# Patient Record
Sex: Female | Born: 1975 | Race: White | Hispanic: No | Smoking: Current some day smoker
Health system: Southern US, Community
[De-identification: ages and names within clinical notes are randomized; demographics above are authoritative.]

## PROBLEM LIST (undated history)

## (undated) DIAGNOSIS — K922 Gastrointestinal hemorrhage, unspecified: Secondary | ICD-10-CM

## (undated) DIAGNOSIS — F319 Bipolar disorder, unspecified: Secondary | ICD-10-CM

## (undated) DIAGNOSIS — Z972 Presence of dental prosthetic device (complete) (partial): Secondary | ICD-10-CM

## (undated) DIAGNOSIS — Z765 Malingerer [conscious simulation]: Secondary | ICD-10-CM

## (undated) DIAGNOSIS — E669 Obesity, unspecified: Secondary | ICD-10-CM

## (undated) DIAGNOSIS — E7800 Pure hypercholesterolemia, unspecified: Secondary | ICD-10-CM

## (undated) DIAGNOSIS — D649 Anemia, unspecified: Secondary | ICD-10-CM

## (undated) DIAGNOSIS — J449 Chronic obstructive pulmonary disease, unspecified: Secondary | ICD-10-CM

## (undated) DIAGNOSIS — I509 Heart failure, unspecified: Secondary | ICD-10-CM

## (undated) DIAGNOSIS — F32A Depression, unspecified: Secondary | ICD-10-CM

## (undated) DIAGNOSIS — J189 Pneumonia, unspecified organism: Secondary | ICD-10-CM

## (undated) DIAGNOSIS — G43909 Migraine, unspecified, not intractable, without status migrainosus: Secondary | ICD-10-CM

## (undated) DIAGNOSIS — F419 Anxiety disorder, unspecified: Secondary | ICD-10-CM

## (undated) DIAGNOSIS — K219 Gastro-esophageal reflux disease without esophagitis: Secondary | ICD-10-CM

## (undated) DIAGNOSIS — J353 Hypertrophy of tonsils with hypertrophy of adenoids: Secondary | ICD-10-CM

## (undated) DIAGNOSIS — F329 Major depressive disorder, single episode, unspecified: Secondary | ICD-10-CM

## (undated) DIAGNOSIS — E119 Type 2 diabetes mellitus without complications: Secondary | ICD-10-CM

## (undated) HISTORY — PX: TONSILLECTOMY: SUR1361

## (undated) HISTORY — DX: Type 2 diabetes mellitus without complications: E11.9

## (undated) HISTORY — PX: CHOLECYSTECTOMY: SHX55

## (undated) HISTORY — PX: OTHER SURGICAL HISTORY: SHX169

## (undated) HISTORY — PX: APPENDECTOMY: SHX54

---

## 1997-11-14 ENCOUNTER — Observation Stay (HOSPITAL_COMMUNITY): Admission: AD | Admit: 1997-11-14 | Discharge: 1997-11-15 | Payer: Self-pay | Admitting: Obstetrics

## 1998-01-26 ENCOUNTER — Emergency Department (HOSPITAL_COMMUNITY): Admission: EM | Admit: 1998-01-26 | Discharge: 1998-01-26 | Payer: Self-pay | Admitting: Emergency Medicine

## 1998-02-05 ENCOUNTER — Emergency Department (HOSPITAL_COMMUNITY): Admission: EM | Admit: 1998-02-05 | Discharge: 1998-02-05 | Payer: Self-pay | Admitting: Emergency Medicine

## 1998-03-25 ENCOUNTER — Emergency Department (HOSPITAL_COMMUNITY): Admission: EM | Admit: 1998-03-25 | Discharge: 1998-03-25 | Payer: Self-pay | Admitting: Emergency Medicine

## 1998-04-10 ENCOUNTER — Emergency Department (HOSPITAL_COMMUNITY): Admission: EM | Admit: 1998-04-10 | Discharge: 1998-04-10 | Payer: Self-pay | Admitting: Family Medicine

## 1998-04-12 ENCOUNTER — Emergency Department (HOSPITAL_COMMUNITY): Admission: EM | Admit: 1998-04-12 | Discharge: 1998-04-13 | Payer: Self-pay | Admitting: Emergency Medicine

## 1998-04-13 ENCOUNTER — Inpatient Hospital Stay (HOSPITAL_COMMUNITY): Admission: AD | Admit: 1998-04-13 | Discharge: 1998-04-13 | Payer: Self-pay | Admitting: *Deleted

## 1998-04-15 ENCOUNTER — Ambulatory Visit (HOSPITAL_COMMUNITY): Admission: RE | Admit: 1998-04-15 | Discharge: 1998-04-15 | Payer: Self-pay | Admitting: Obstetrics and Gynecology

## 1998-04-17 ENCOUNTER — Ambulatory Visit (HOSPITAL_COMMUNITY): Admission: RE | Admit: 1998-04-17 | Discharge: 1998-04-17 | Payer: Self-pay | Admitting: Obstetrics and Gynecology

## 1998-04-22 ENCOUNTER — Emergency Department (HOSPITAL_COMMUNITY): Admission: EM | Admit: 1998-04-22 | Discharge: 1998-04-22 | Payer: Self-pay | Admitting: Emergency Medicine

## 1998-04-25 ENCOUNTER — Ambulatory Visit (HOSPITAL_COMMUNITY): Admission: RE | Admit: 1998-04-25 | Discharge: 1998-04-25 | Payer: Self-pay | Admitting: Obstetrics and Gynecology

## 1998-05-09 ENCOUNTER — Inpatient Hospital Stay (HOSPITAL_COMMUNITY): Admission: AD | Admit: 1998-05-09 | Discharge: 1998-05-09 | Payer: Self-pay | Admitting: Obstetrics & Gynecology

## 1998-05-10 ENCOUNTER — Ambulatory Visit (HOSPITAL_COMMUNITY): Admission: RE | Admit: 1998-05-10 | Discharge: 1998-05-10 | Payer: Self-pay | Admitting: Obstetrics & Gynecology

## 1998-09-22 ENCOUNTER — Encounter: Payer: Self-pay | Admitting: Emergency Medicine

## 1998-09-22 ENCOUNTER — Emergency Department (HOSPITAL_COMMUNITY): Admission: EM | Admit: 1998-09-22 | Discharge: 1998-09-22 | Payer: Self-pay | Admitting: Emergency Medicine

## 1998-09-24 ENCOUNTER — Emergency Department (HOSPITAL_COMMUNITY): Admission: EM | Admit: 1998-09-24 | Discharge: 1998-09-24 | Payer: Self-pay | Admitting: Internal Medicine

## 1998-10-05 ENCOUNTER — Inpatient Hospital Stay (HOSPITAL_COMMUNITY): Admission: AD | Admit: 1998-10-05 | Discharge: 1998-10-05 | Payer: Self-pay | Admitting: *Deleted

## 1998-10-10 ENCOUNTER — Emergency Department (HOSPITAL_COMMUNITY): Admission: EM | Admit: 1998-10-10 | Discharge: 1998-10-10 | Payer: Self-pay | Admitting: Emergency Medicine

## 1998-11-09 ENCOUNTER — Emergency Department (HOSPITAL_COMMUNITY): Admission: EM | Admit: 1998-11-09 | Discharge: 1998-11-09 | Payer: Self-pay | Admitting: Emergency Medicine

## 1998-12-04 ENCOUNTER — Inpatient Hospital Stay (HOSPITAL_COMMUNITY): Admission: AD | Admit: 1998-12-04 | Discharge: 1998-12-04 | Payer: Self-pay | Admitting: Obstetrics

## 1998-12-04 ENCOUNTER — Encounter: Payer: Self-pay | Admitting: Obstetrics

## 1998-12-07 ENCOUNTER — Emergency Department (HOSPITAL_COMMUNITY): Admission: EM | Admit: 1998-12-07 | Discharge: 1998-12-07 | Payer: Self-pay | Admitting: Internal Medicine

## 1998-12-07 ENCOUNTER — Emergency Department (HOSPITAL_COMMUNITY): Admission: EM | Admit: 1998-12-07 | Discharge: 1998-12-07 | Payer: Self-pay | Admitting: Emergency Medicine

## 1999-01-09 ENCOUNTER — Inpatient Hospital Stay (HOSPITAL_COMMUNITY): Admission: EM | Admit: 1999-01-09 | Discharge: 1999-01-10 | Payer: Self-pay | Admitting: Emergency Medicine

## 1999-01-10 ENCOUNTER — Inpatient Hospital Stay (HOSPITAL_COMMUNITY): Admission: RE | Admit: 1999-01-10 | Discharge: 1999-01-12 | Payer: Self-pay | Admitting: Specialist

## 1999-06-12 ENCOUNTER — Emergency Department (HOSPITAL_COMMUNITY): Admission: EM | Admit: 1999-06-12 | Discharge: 1999-06-13 | Payer: Self-pay | Admitting: Emergency Medicine

## 1999-07-11 ENCOUNTER — Emergency Department (HOSPITAL_COMMUNITY): Admission: EM | Admit: 1999-07-11 | Discharge: 1999-07-11 | Payer: Self-pay | Admitting: Emergency Medicine

## 2000-02-02 ENCOUNTER — Inpatient Hospital Stay (HOSPITAL_COMMUNITY): Admission: EM | Admit: 2000-02-02 | Discharge: 2000-02-02 | Payer: Self-pay | Admitting: Obstetrics & Gynecology

## 2000-04-07 ENCOUNTER — Inpatient Hospital Stay (HOSPITAL_COMMUNITY): Admission: RE | Admit: 2000-04-07 | Discharge: 2000-04-07 | Payer: Self-pay | Admitting: *Deleted

## 2000-04-09 ENCOUNTER — Ambulatory Visit (HOSPITAL_COMMUNITY): Admission: AD | Admit: 2000-04-09 | Discharge: 2000-04-09 | Payer: Self-pay | Admitting: *Deleted

## 2000-04-09 ENCOUNTER — Encounter (INDEPENDENT_AMBULATORY_CARE_PROVIDER_SITE_OTHER): Payer: Self-pay

## 2000-04-09 HISTORY — PX: DILATION AND EVACUATION: SHX1459

## 2001-02-03 ENCOUNTER — Inpatient Hospital Stay (HOSPITAL_COMMUNITY): Admission: AD | Admit: 2001-02-03 | Discharge: 2001-02-03 | Payer: Self-pay | Admitting: *Deleted

## 2001-02-08 ENCOUNTER — Inpatient Hospital Stay (HOSPITAL_COMMUNITY): Admission: AD | Admit: 2001-02-08 | Discharge: 2001-02-08 | Payer: Self-pay | Admitting: *Deleted

## 2001-02-08 ENCOUNTER — Encounter: Payer: Self-pay | Admitting: *Deleted

## 2001-02-23 ENCOUNTER — Other Ambulatory Visit: Admission: RE | Admit: 2001-02-23 | Discharge: 2001-02-23 | Payer: Self-pay | Admitting: Obstetrics

## 2001-05-30 ENCOUNTER — Inpatient Hospital Stay (HOSPITAL_COMMUNITY): Admission: AD | Admit: 2001-05-30 | Discharge: 2001-05-30 | Payer: Self-pay | Admitting: Obstetrics

## 2001-06-19 ENCOUNTER — Inpatient Hospital Stay (HOSPITAL_COMMUNITY): Admission: AD | Admit: 2001-06-19 | Discharge: 2001-06-19 | Payer: Self-pay | Admitting: Obstetrics

## 2001-07-13 ENCOUNTER — Inpatient Hospital Stay (HOSPITAL_COMMUNITY): Admission: AD | Admit: 2001-07-13 | Discharge: 2001-07-13 | Payer: Self-pay | Admitting: Obstetrics

## 2001-07-20 ENCOUNTER — Inpatient Hospital Stay (HOSPITAL_COMMUNITY): Admission: AD | Admit: 2001-07-20 | Discharge: 2001-07-20 | Payer: Self-pay | Admitting: Obstetrics

## 2001-07-20 ENCOUNTER — Encounter: Payer: Self-pay | Admitting: Obstetrics

## 2001-07-21 ENCOUNTER — Inpatient Hospital Stay (HOSPITAL_COMMUNITY): Admission: AD | Admit: 2001-07-21 | Discharge: 2001-07-21 | Payer: Self-pay | Admitting: Obstetrics

## 2001-07-24 ENCOUNTER — Observation Stay (HOSPITAL_COMMUNITY): Admission: AD | Admit: 2001-07-24 | Discharge: 2001-07-25 | Payer: Self-pay | Admitting: Obstetrics

## 2001-08-30 ENCOUNTER — Inpatient Hospital Stay (HOSPITAL_COMMUNITY): Admission: AD | Admit: 2001-08-30 | Discharge: 2001-08-30 | Payer: Self-pay | Admitting: Obstetrics

## 2001-08-31 ENCOUNTER — Encounter (INDEPENDENT_AMBULATORY_CARE_PROVIDER_SITE_OTHER): Payer: Self-pay

## 2001-08-31 ENCOUNTER — Inpatient Hospital Stay (HOSPITAL_COMMUNITY): Admission: AD | Admit: 2001-08-31 | Discharge: 2001-09-03 | Payer: Self-pay | Admitting: Obstetrics

## 2001-08-31 HISTORY — PX: TUBAL LIGATION: SHX77

## 2002-01-25 ENCOUNTER — Emergency Department (HOSPITAL_COMMUNITY): Admission: EM | Admit: 2002-01-25 | Discharge: 2002-01-25 | Payer: Self-pay | Admitting: Emergency Medicine

## 2002-02-09 ENCOUNTER — Emergency Department (HOSPITAL_COMMUNITY): Admission: EM | Admit: 2002-02-09 | Discharge: 2002-02-09 | Payer: Self-pay

## 2002-08-22 ENCOUNTER — Emergency Department (HOSPITAL_COMMUNITY): Admission: EM | Admit: 2002-08-22 | Discharge: 2002-08-22 | Payer: Self-pay | Admitting: Emergency Medicine

## 2002-12-11 ENCOUNTER — Inpatient Hospital Stay (HOSPITAL_COMMUNITY): Admission: AD | Admit: 2002-12-11 | Discharge: 2002-12-11 | Payer: Self-pay | Admitting: Family Medicine

## 2002-12-11 ENCOUNTER — Encounter: Payer: Self-pay | Admitting: Obstetrics and Gynecology

## 2002-12-20 ENCOUNTER — Emergency Department (HOSPITAL_COMMUNITY): Admission: EM | Admit: 2002-12-20 | Discharge: 2002-12-20 | Payer: Self-pay

## 2003-07-22 ENCOUNTER — Emergency Department (HOSPITAL_COMMUNITY): Admission: AD | Admit: 2003-07-22 | Discharge: 2003-07-22 | Payer: Self-pay | Admitting: Internal Medicine

## 2004-10-12 ENCOUNTER — Emergency Department: Payer: Self-pay | Admitting: Internal Medicine

## 2004-10-12 ENCOUNTER — Emergency Department: Payer: Self-pay | Admitting: General Practice

## 2004-11-19 ENCOUNTER — Emergency Department (HOSPITAL_COMMUNITY): Admission: EM | Admit: 2004-11-19 | Discharge: 2004-11-19 | Payer: Self-pay | Admitting: Family Medicine

## 2005-02-09 ENCOUNTER — Emergency Department: Payer: Self-pay | Admitting: Internal Medicine

## 2008-04-03 ENCOUNTER — Emergency Department (HOSPITAL_COMMUNITY): Admission: EM | Admit: 2008-04-03 | Discharge: 2008-04-03 | Payer: Self-pay | Admitting: Emergency Medicine

## 2008-06-24 ENCOUNTER — Emergency Department: Payer: Self-pay | Admitting: Emergency Medicine

## 2008-07-01 ENCOUNTER — Emergency Department (HOSPITAL_COMMUNITY): Admission: EM | Admit: 2008-07-01 | Discharge: 2008-07-01 | Payer: Self-pay | Admitting: Emergency Medicine

## 2008-09-09 ENCOUNTER — Emergency Department: Payer: Self-pay | Admitting: Emergency Medicine

## 2008-11-08 ENCOUNTER — Emergency Department (HOSPITAL_COMMUNITY): Admission: EM | Admit: 2008-11-08 | Discharge: 2008-11-08 | Payer: Self-pay | Admitting: Family Medicine

## 2010-03-09 ENCOUNTER — Emergency Department (HOSPITAL_COMMUNITY): Admission: EM | Admit: 2010-03-09 | Discharge: 2010-03-09 | Payer: Self-pay | Admitting: Emergency Medicine

## 2010-03-11 ENCOUNTER — Inpatient Hospital Stay (HOSPITAL_COMMUNITY): Admission: AD | Admit: 2010-03-11 | Discharge: 2010-03-11 | Payer: Self-pay | Admitting: Family Medicine

## 2010-03-11 ENCOUNTER — Ambulatory Visit: Payer: Self-pay | Admitting: Advanced Practice Midwife

## 2010-05-04 ENCOUNTER — Emergency Department (HOSPITAL_COMMUNITY): Admission: EM | Admit: 2010-05-04 | Discharge: 2010-05-04 | Payer: Self-pay | Admitting: Emergency Medicine

## 2010-06-18 ENCOUNTER — Emergency Department: Payer: Self-pay | Admitting: Unknown Physician Specialty

## 2010-09-02 ENCOUNTER — Ambulatory Visit: Payer: Self-pay | Admitting: Surgery

## 2010-09-09 ENCOUNTER — Ambulatory Visit: Payer: Self-pay | Admitting: Surgery

## 2010-09-10 LAB — PATHOLOGY REPORT

## 2010-09-11 ENCOUNTER — Ambulatory Visit: Payer: Self-pay | Admitting: Surgery

## 2010-12-15 LAB — URINALYSIS, ROUTINE W REFLEX MICROSCOPIC
Ketones, ur: NEGATIVE mg/dL
Leukocytes, UA: NEGATIVE
Nitrite: POSITIVE — AB
Urobilinogen, UA: 0.2 mg/dL (ref 0.0–1.0)
pH: 6 (ref 5.0–8.0)

## 2010-12-15 LAB — DIFFERENTIAL
Basophils Absolute: 0 10*3/uL (ref 0.0–0.1)
Basophils Relative: 0 % (ref 0–1)
Eosinophils Relative: 0 % (ref 0–5)
Monocytes Absolute: 0.6 10*3/uL (ref 0.1–1.0)

## 2010-12-15 LAB — PREGNANCY, URINE: Preg Test, Ur: NEGATIVE

## 2010-12-15 LAB — LIPASE, BLOOD: Lipase: 23 U/L (ref 11–59)

## 2010-12-15 LAB — COMPREHENSIVE METABOLIC PANEL
AST: 18 U/L (ref 0–37)
Alkaline Phosphatase: 88 U/L (ref 39–117)
CO2: 20 mEq/L (ref 19–32)
Calcium: 9.6 mg/dL (ref 8.4–10.5)
Chloride: 109 mEq/L (ref 96–112)
GFR calc Af Amer: >60 mL/min (ref >60)
Glucose, Bld: 117 mg/dL — ABNORMAL HIGH (ref 70–99)
Sodium: 138 mEq/L (ref 135–145)
Total Protein: 7.3 g/dL (ref 6.0–8.3)

## 2010-12-15 LAB — URINE MICROSCOPIC-ADD ON

## 2010-12-15 LAB — CBC
HCT: 39.7 % (ref 36.0–46.0)
Hemoglobin: 13.2 g/dL (ref 12.0–15.0)
MCHC: 33.4 g/dL (ref 30.0–36.0)
MCV: 81.4 fL (ref 78.0–100.0)
RDW: 16.7 % — ABNORMAL HIGH (ref 11.5–15.5)
WBC: 12.9 10*3/uL — ABNORMAL HIGH (ref 4.0–10.5)

## 2010-12-15 LAB — GC/CHLAMYDIA PROBE AMP, GENITAL
Chlamydia, DNA Probe: NEGATIVE
GC Probe Amp, Genital: NEGATIVE

## 2010-12-15 LAB — WET PREP, GENITAL
Clue Cells Wet Prep HPF POC: NONE SEEN
Yeast Wet Prep HPF POC: NONE SEEN

## 2010-12-15 LAB — URINE CULTURE: Culture: NO GROWTH

## 2011-02-13 NOTE — H&P (Signed)
[facility]  Patient:    Shelby Hatfield, Shelby Hatfield Visit Number: [id number] MRN: [id number]          Service Type: OBS Location: 910B 9198 01 Attending Physician:  Venita Sheffield Dictated by:   Kathreen Cosier, M.D. Admit Date:  08/31/2001                           History and Physical  PREOPERATIVE DIAGNOSES:       1. Previous cesarean section x 2 at 36 weeks                                  four days in labor.                               2. Multiparity, desiring sterilization.  SURGEON:                      Kathreen Cosier, M.D.  ASSISTANT:                    Bing Neighbors. Clearance Coots, M.D.  ANESTHESIA:                   Spinal by Dr. Pamalee Leyden.  HISTORY OF PRESENT ILLNESS:   The patient is a 35 year old gravida 6 para 2-0-3-2, [facility] September 24, 2001; had two C sections in the past, desires repeat C section.  Patient was contracting since last night.  We stopped the contractions last night with terbutaline and she restarted contracting today. She was sent for repeat C section and tubal ligation.  Negative group B strep. She was also a gestational diabetic who had normal sugars controlled on diet. She was followed by Dr. Margaretmary Bayley.  PHYSICAL EXAMINATION:  GENERAL:                      Revealed an obese female in labor.  HEENT:                        Negative.  LUNGS:                        Clear.  HEART:                        Regular rhythm.  No murmurs, no gallops.  ABDOMEN:                      Term size uterus.  Estimated fetal weight 6 pounds 4 ounces.  PELVIC:                       Cervix was long, closed, with the vertex -3.  EXTREMITIES:                  Negative. Dictated by:   Kathreen Cosier, M.D. Attending Physician:  Venita Sheffield DD:  08/31/01 TD:  08/31/01 Job: [id number]

## 2011-02-13 NOTE — Discharge Summary (Signed)
[facility]  Patient:    Shelby Hatfield, Shelby Hatfield Visit Number: [id number] MRN: [id number]          Service Type: OBS Location: 910A 9106 01 Attending Physician:  Venita Sheffield Dictated by:   Kathreen Cosier, M.D. Admit Date:  08/31/2001 Discharge Date: 09/03/2001                             Discharge Summary  HISTORY OF PRESENT ILLNESS:   The patient is a 35 year old, gravida 6, para 2-0-3-2, [facility] September 24, 2001.  She had two previous cesarean sections and desired a repeat cesarean section and tubal ligation.  She was admitted in labor.  She had been seen the night before contracting and terbutaline which stopped her contractions but then they resumed on the 4th.  The patient also has a history of gestational diabetes controlled with diet.  She underwent a repeat low transverse cesarean section and tubal ligation. Had a female, Apgar 8 and 9 weighing 6 pounds 11 ounces.  The fluid was clear.  On admission, her hemoglobin was 7.74 and status post delivery it was 6.1. She was asymptomatic.  She had a tubal ligation performed also.  She was discharged home on the third postoperative day ambulatory and on a regular diet.  She was on Tylox 1 p.o. q.4h. p.r.n. pain.  Ferrous sulfate 325 mg p.o. b.i.d.  She is to see me in six weeks.  DISCHARGE DIAGNOSES:          1. Status post repeat low transverse cesarean                                  section at 36+ weeks.                               2. Tubal ligation. Dictated by:   Kathreen Cosier, M.D. Attending Physician:  Venita Sheffield DD:  09/03/01 TD:  09/03/01 Job: [id number]

## 2011-02-13 NOTE — Op Note (Signed)
[facility]  Patient:    Shelby Hatfield, Shelby Hatfield Visit Number: [id number] MRN: [id number]          Service Type: OBS Location: 910A 9106 01 Attending Physician:  Venita Sheffield Dictated by:   Kathreen Cosier, M.D. Proc. Date: 08/31/01 Admit Date:  08/31/2001                             Operative Report  PREOPERATIVE DIAGNOSES:       1. Pregnancy at 36 weeks and 4 days.                               2. Two previous cesarean sections.                               3. Multiparity.  SURGEON:                      Kathreen Cosier, M.D.  DESCRIPTION OF PROCEDURE:     The patient was placed on the operating table in the supine position after spinal was administered.  The abdomen was prepped and draped.  The bladder was emptied with a Foley catheter.  A transverse suprapubic incision was made through the old scar and carried down to the rectus fascia.  The fascia was cleanly incised for the length of the incision. The rectus muscles were retracted laterally.  The peritoneum was incised longitudinally.  A transverse incision was made in the visceral peritoneum above the bladder.  The bladder was mobilized inferiorly.  A transverse lower uterine incision was made.  Fluid was clear.  The patient was delivered from the OP position of a female.  Apgars were 8 and 9.  Weight was 6 lb 11 oz. The team was in attendance.  The placenta was anterior and removed manually. The uterine cavity was cleaned with dry laps.  The uterine incision was closed in one layer with continuous suture of #1 chromic.  Hemostasis was satisfactory.  The bladder flap was reattached with 2-0 chromic.  The right tube and ovary were normal.  The left tube an ovary were normal.  The right tube was grasped in the midportion with a Babcock clamp and 0 plain suture was placed in the mesosalpinx below the portion of tube within the clamp.  This was tied on both side and approximately  1 inch of tube was transected.  The procedure was done in a similar fashion on the other side.  Hemostasis was satisfactory.  The uterus was well contracted.  The abdomen was closed in layers, the peritoneum with continuous suture of 0 chromic, the fascia with continuous suture of 0 Dexon.  The skin was closed with subcuticular stitch of 3-0 plain.  Blood loss was 500 cc.  The patient tolerated the procedure well and was taken to the recovery room in good condition. Dictated by:   Kathreen Cosier, M.D. Attending Physician:  Venita Sheffield DD:  08/31/01 TD:  08/31/01 Job: [id number]

## 2011-02-13 NOTE — Op Note (Signed)
[facility]  Patient:    Shelby Hatfield, Shelby Hatfield                       MRN: [id number] Proc. Date: 04/09/00 Adm. Date:  [id number] Disc. Date: [id number] Attending:  Michaelle Copas CC:         [facility]                           Operative Report  PREOPERATIVE DIAGNOSIS:       First trimester anembryonic intrauterine gestation.  POSTOPERATIVE DIAGNOSIS:      First trimester anembryonic intrauterine gestation.  PROCEDURE:                    Suction dilation and evacuation.  SURGEON:                      Charles A. Clearance Coots, M.D.  ANESTHESIA:                   MAC with paracervical block.  ESTIMATED BLOOD LOSS:         100 ml.  COMPLICATIONS:                None.  SPECIMENS:                    Products of conception.  DESCRIPTION OF PROCEDURE:     The patient was brought to the operating room and, after satisfactory IV sedation, the legs were brought up in stirrups. The vagina was prepped and draped in the usual sterile fashion.  The urinary bladder was emptied of approximately 50 cc of clear urine.  Bimanual examination revealed the uterus to be 6-8 weeks size and mid position.  A sterile speculum was inserted in the vaginal vault and the cervix was isolated.  The anterior lip of the cervix was grasped with a single tooth tenaculum.  Paracervical block of 1% Nesacaine was injected at the 3 and 9 oclock position in the cervical stroma in routine fashion at each lateral fornix.  Approximately 10 ml in each lateral fornix at 3 and 9 oclock positions.  The cervix was then dilated to a #25 Pratt dilator and a #7 suction catheter was easily introduced into the uterine cavity an all contents were evacuated.  The endometrial surface was then curetted with a small sharp curet and no further products of conception were obtained.  There was no active bleeding at the conclusion of the procedure.  All instruments were removed.   The patient tolerated the procedure well and was transported to the recovery room in satisfactory condition. DD:  04/09/00 TD:  04/10/00 Job: [id number]

## 2011-06-30 LAB — STREP A DNA PROBE: Group A Strep Probe: NEGATIVE

## 2011-06-30 LAB — RAPID STREP SCREEN (MED CTR MEBANE ONLY): Streptococcus, Group A Screen (Direct): NEGATIVE

## 2011-07-17 ENCOUNTER — Encounter: Payer: Self-pay | Admitting: *Deleted

## 2011-07-17 ENCOUNTER — Emergency Department (HOSPITAL_COMMUNITY)
Admission: EM | Admit: 2011-07-17 | Discharge: 2011-07-17 | Disposition: A | Discharge status: Home / Self Care | Payer: Self-pay | Attending: Emergency Medicine | Admitting: Emergency Medicine

## 2011-07-17 DIAGNOSIS — M5430 Sciatica, unspecified side: Secondary | ICD-10-CM

## 2011-07-17 DIAGNOSIS — M545 Low back pain, unspecified: Secondary | ICD-10-CM | POA: Insufficient documentation

## 2011-07-17 DIAGNOSIS — X500XXA Overexertion from strenuous movement or load, initial encounter: Secondary | ICD-10-CM | POA: Insufficient documentation

## 2011-07-17 DIAGNOSIS — M533 Sacrococcygeal disorders, not elsewhere classified: Secondary | ICD-10-CM | POA: Insufficient documentation

## 2011-07-17 DIAGNOSIS — F17200 Nicotine dependence, unspecified, uncomplicated: Secondary | ICD-10-CM | POA: Insufficient documentation

## 2011-07-17 DIAGNOSIS — M79609 Pain in unspecified limb: Secondary | ICD-10-CM | POA: Insufficient documentation

## 2011-07-17 MED ORDER — PREDNISONE 10 MG PO TABS: ORAL_TABLET | ORAL | Status: DC

## 2011-07-17 MED ORDER — METHOCARBAMOL 500 MG PO TABS
1000.0000 mg | ORAL_TABLET | Freq: Once | ORAL | Status: AC
  Administered 2011-07-17: 1000 mg via ORAL
  Filled 2011-07-17: qty 2

## 2011-07-17 MED ORDER — METHOCARBAMOL 500 MG PO TABS: ORAL_TABLET | ORAL | Status: DC

## 2011-07-17 MED ORDER — OXYCODONE-ACETAMINOPHEN 5-325 MG PO TABS: 1.0000 | ORAL_TABLET | ORAL | Status: AC | PRN

## 2011-07-17 MED ORDER — OXYCODONE-ACETAMINOPHEN 5-325 MG PO TABS
1.0000 | ORAL_TABLET | Freq: Once | ORAL | Status: AC
  Administered 2011-07-17: 1 via ORAL
  Filled 2011-07-17: qty 1

## 2011-07-17 NOTE — ED Notes (Signed)
Pt c/o shooting pain starting in her lower left leg radiating to left hip and lower back.

## 2011-07-17 NOTE — ED Provider Notes (Signed)
History     CSN: [id number] Arrival date & time: 07/17/2011  9:25 PM   First MD Initiated Contact with Patient 07/17/11 2129      Chief Complaint  Patient presents with  . Leg Pain  . Hip Pain  . Back Pain    (Consider location/radiation/quality/duration/timing/severity/associated sxs/prior treatment) Patient is a 35 y.o. female presenting with back pain. The history is provided by the patient.  Back Pain  This is a new problem. The current episode started 2 days ago. The problem occurs constantly. The problem has not changed since onset.The pain is associated with lifting heavy objects and twisting. The pain is present in the lumbar spine and sacro-iliac joint. The quality of the pain is described as stabbing, burning and aching. The pain radiates to the left thigh (left hip, left buttocks). The pain is moderate. The symptoms are aggravated by bending, twisting and certain positions. The pain is the same all the time. Associated symptoms include leg pain. Pertinent negatives include no chest pain, no fever, no numbness, no abdominal pain, no abdominal swelling, no bowel incontinence, no perianal numbness, no bladder incontinence, no dysuria, no pelvic pain, no paresthesias, no paresis, no tingling and no weakness. She has tried nothing for the symptoms. The treatment provided no relief.    History reviewed. No pertinent past medical history.  Past Surgical History  Procedure Date  . Cesarean section   . Appendectomy   . Cholecystectomy     History reviewed. No pertinent family history.  History  Substance Use Topics  . Smoking status: Current Everyday Smoker  . Smokeless tobacco: Not on file  . Alcohol Use: No    OB History    Grav Para Term Preterm Abortions TAB SAB Ect Mult Living                  Review of Systems  Constitutional: Negative for fever, chills and fatigue.  HENT: Negative for sore throat, trouble swallowing, neck pain and neck stiffness.     Respiratory: Negative for cough, shortness of breath and wheezing.   Cardiovascular: Negative for chest pain and palpitations.  Gastrointestinal: Negative for nausea, vomiting, abdominal pain, blood in stool and bowel incontinence.  Genitourinary: Negative for bladder incontinence, dysuria, hematuria, flank pain and pelvic pain.  Musculoskeletal: Positive for back pain. Negative for myalgias, joint swelling and arthralgias.  Skin: Negative for rash.  Neurological: Negative for dizziness, tingling, weakness, numbness and paresthesias.  Hematological: Does not bruise/bleed easily.    Allergies  Penicillins  Home Medications  No current outpatient prescriptions on file.  BP 130/77  Pulse 80  Temp(Src) 98.2 F (36.8 C) (Oral)  Resp 20  Ht 5\' 2"  (1.575 m)  Wt 217 lb (98.431 kg)  BMI 39.69 kg/m2  SpO2 100%  LMP 07/06/2011  Physical Exam  Nursing note and vitals reviewed. Constitutional: She is oriented to person, place, and time. She appears well-developed and well-nourished. No distress.  HENT:  Head: Normocephalic and atraumatic.  Mouth/Throat: Oropharynx is clear and moist.  Neck: Normal range of motion. Neck supple.  Cardiovascular: Normal rate, regular rhythm and normal heart sounds.   Pulmonary/Chest: Effort normal and breath sounds normal. No respiratory distress. She exhibits no tenderness.  Abdominal: Soft. She exhibits no distension. There is no tenderness.  Musculoskeletal: Normal range of motion. She exhibits tenderness. She exhibits no edema.       Lumbar back: She exhibits tenderness. She exhibits normal range of motion, no bony tenderness, no edema, no  spasm and normal pulse.       Back:  Lymphadenopathy:    She has no cervical adenopathy.  Neurological: She is alert and oriented to person, place, and time. She has normal strength. No cranial nerve deficit or sensory deficit. She exhibits normal muscle tone. Coordination and gait normal.  Reflex Scores:       Patellar reflexes are 2+ on the right side and 2+ on the left side.      Achilles reflexes are 2+ on the right side and 2+ on the left side. Skin: Skin is warm and dry.    ED Course  Procedures (including critical care time)       MDM    9:44 PM ttp of the left SI joint space. Patient is ambulatory, no focal neuro deficits.  Likely sciatica      Doralyn Kirkes L. Lavon Horn, [place] 07/22/11 1312

## 2011-07-28 NOTE — ED Provider Notes (Signed)
Medical screening examination/treatment/procedure(s) were performed by non-physician practitioner and as supervising physician I was immediately available for consultation/collaboration.  Donnetta Hutching, MD 07/28/11 [phone]

## 2011-11-24 ENCOUNTER — Emergency Department: Payer: Self-pay | Admitting: Emergency Medicine

## 2011-12-02 ENCOUNTER — Emergency Department: Payer: Self-pay | Admitting: Emergency Medicine

## 2011-12-10 ENCOUNTER — Encounter (HOSPITAL_COMMUNITY): Payer: Self-pay | Admitting: *Deleted

## 2011-12-10 ENCOUNTER — Emergency Department (INDEPENDENT_AMBULATORY_CARE_PROVIDER_SITE_OTHER)
Admission: EM | Admit: 2011-12-10 | Discharge: 2011-12-10 | Disposition: A | Discharge status: Home / Self Care | Payer: Self-pay | Source: Home / Self Care | Attending: Emergency Medicine | Admitting: Emergency Medicine

## 2011-12-10 DIAGNOSIS — R109 Unspecified abdominal pain: Secondary | ICD-10-CM

## 2011-12-10 DIAGNOSIS — K5289 Other specified noninfective gastroenteritis and colitis: Secondary | ICD-10-CM

## 2011-12-10 DIAGNOSIS — K529 Noninfective gastroenteritis and colitis, unspecified: Secondary | ICD-10-CM

## 2011-12-10 HISTORY — DX: Bipolar disorder, unspecified: F31.9

## 2011-12-10 HISTORY — DX: Migraine, unspecified, not intractable, without status migrainosus: G43.909

## 2011-12-10 LAB — POCT URINALYSIS DIP (DEVICE)
Glucose, UA: NEGATIVE mg/dL
Leukocytes, UA: NEGATIVE
Protein, ur: 100 mg/dL — AB
Urobilinogen, UA: 1 mg/dL (ref 0.0–1.0)

## 2011-12-10 LAB — POCT PREGNANCY, URINE: Preg Test, Ur: NEGATIVE

## 2011-12-10 MED ORDER — ONDANSETRON 4 MG PO TBDP
ORAL_TABLET | ORAL | Status: AC
  Filled 2011-12-10: qty 1

## 2011-12-10 MED ORDER — DIPHENOXYLATE-ATROPINE 2.5-0.025 MG PO TABS: 1.0000 | ORAL_TABLET | Freq: Four times a day (QID) | ORAL | Status: AC | PRN

## 2011-12-10 MED ORDER — ACIDOPHILUS PROBIOTIC BLEND PO CAPS: 2.0000 | ORAL_CAPSULE | Freq: Two times a day (BID) | ORAL | Status: DC

## 2011-12-10 MED ORDER — ONDANSETRON 4 MG PO TBDP
4.0000 mg | ORAL_TABLET | Freq: Once | ORAL | Status: AC
  Administered 2011-12-10: 4 mg via ORAL

## 2011-12-10 MED ORDER — ONDANSETRON HCL 4 MG PO TABS: 4.0000 mg | ORAL_TABLET | Freq: Three times a day (TID) | ORAL | Status: AC | PRN

## 2011-12-10 MED ORDER — IBUPROFEN 600 MG PO TABS: 600.0000 mg | ORAL_TABLET | Freq: Four times a day (QID) | ORAL | Status: AC | PRN

## 2011-12-10 NOTE — Discharge Instructions (Signed)
Go to the ER if you are not getting signifcantly better in 24 hours. Go to the ER if you get worse in any way. Clear liquids only for 24 hours.

## 2011-12-10 NOTE — ED Notes (Signed)
Pt    Reports    Symptoms  Of  l  Sided  Pain  With  Nausea  /  Vomiting  Diarrhea      Symptoms    Began   Yesterday         She  Reports   Lots  Of  Diarrhea     Today     -   She  Reports  Body  Aches  As  Well      Pt  Has  Been tolerating  Small  amts  Of  Clear liquids

## 2011-12-10 NOTE — ED Provider Notes (Signed)
History     CSN: [id number]  Arrival date & time 12/10/11  1704   First MD Initiated Contact with Patient 12/10/11 1720      Chief Complaint  Patient presents with  . Emesis    (Consider location/radiation/quality/duration/timing/severity/associated sxs/prior treatment) HPI Comments: Patient with multiple episodes of NBNB emesis starting yesterday, which she states is slowing down. Patient has not vomited since this morning. Is able to drink small amounts of water. Today patient reports crampy, diffuse abdominal pain with watery, nonbloody diarrhea, malaise, and generalized weakness.. Patient also reports intermittent, nonradiating, sharp, left lower quadrant pain lasting approximately 30 seconds and then resolving. Pain is better when she lies down on her right side. Pain aggravated with palpation.  It is not otherwise associated with with movement, defecation, vomiting, urination. Patient did not report any pain car ride over here. No abdominal distention. Patient is currently on menses. Denies any vaginal discharge. No  urinary urgency, frequency, cloudy or oderous urine, hematuria.  no excessive alcohol intake, recent travel, raw/undercooked foods, antibiotic use, known sick contacts. surgical history includes cholecystectomy, appendectomy. No history of kidney stones.  Patient is a 36 y.o. female presenting with abdominal pain. The history is provided by the patient. No language interpreter was used.  Abdominal Pain The primary symptoms of the illness include abdominal pain, fatigue, nausea, vomiting, diarrhea and vaginal bleeding. The primary symptoms of the illness do not include fever, shortness of breath, dysuria or vaginal discharge. The current episode started yesterday. The onset of the illness was sudden. The problem has not changed since onset. The patient states that she believes she is currently not pregnant. The patient has not had a change in bowel habit. Symptoms associated  with the illness do not include urgency or hematuria. Significant associated medical issues do not include inflammatory bowel disease, diabetes, liver disease, substance abuse or diverticulitis.    Past Medical History  Diagnosis Date  . Bipolar 1 disorder   . Migraines     Past Surgical History  Procedure Date  . Cesarean section   . Appendectomy   . Cholecystectomy     No family history on file.  History  Substance Use Topics  . Smoking status: Current Everyday Smoker  . Smokeless tobacco: Not on file  . Alcohol Use: No    OB History    Grav Para Term Preterm Abortions TAB SAB Ect Mult Living                  Review of Systems  Constitutional: Positive for fatigue. Negative for fever.  Respiratory: Negative for cough and shortness of breath.   Cardiovascular: Negative for chest pain.  Gastrointestinal: Positive for nausea, vomiting, abdominal pain and diarrhea. Negative for blood in stool, abdominal distention and rectal pain.  Genitourinary: Positive for vaginal bleeding. Negative for dysuria, urgency, hematuria, flank pain, vaginal discharge and pelvic pain.  Musculoskeletal: Positive for myalgias.  Neurological: Positive for weakness.    Allergies  Penicillins  Home Medications   Current Outpatient Rx  Name Route Sig Dispense Refill  . BUTALBITAL-APAP-CAFF-COD 50-325-40-30 MG PO CAPS Oral Take 1 capsule by mouth every 4 (four) hours as needed.    Marland Kitchen LAMOTRIGINE 200 MG PO TABS Oral Take 200 mg by mouth daily.    Marland Kitchen DIPHENOXYLATE-ATROPINE 2.5-0.025 MG PO TABS Oral Take 1 tablet by mouth 4 (four) times daily as needed for diarrhea or loose stools. 30 tablet 0  . IBUPROFEN 600 MG PO TABS Oral Take 1 tablet (  600 mg total) by mouth every 6 (six) hours as needed for pain. 30 tablet 0  . ONDANSETRON HCL 4 MG PO TABS Oral Take 1 tablet (4 mg total) by mouth every 8 (eight) hours as needed for nausea. May cause constipation 20 tablet 0  . ACIDOPHILUS PROBIOTIC BLEND PO  CAPS Oral Take 2 capsules by mouth 2 (two) times daily. 60 capsule 0    BP 116/87  Pulse 84  Temp(Src) 98.9 F (37.2 C) (Oral)  Resp 18  SpO2 94%  LMP 12/10/2011 Repeat O2 sat 100% on room air  Physical Exam  Nursing note and vitals reviewed. Constitutional: She is oriented to person, place, and time. She appears well-developed and well-nourished.  HENT:  Head: Normocephalic and atraumatic.  Eyes: Conjunctivae and EOM are normal.  Neck: Normal range of motion.  Cardiovascular: Normal rate, regular rhythm, normal heart sounds and intact distal pulses.   Pulmonary/Chest: Effort normal and breath sounds normal.  Abdominal: Soft. Bowel sounds are normal. She exhibits no distension. There is no hepatomegaly. There is generalized tenderness. There is no rigidity, no rebound, no guarding and no CVA tenderness.       Healed laparoscopic cholecystectomy, and lap laparoscopic appendectomy scars. Diffuse tenderness, maximal left lower quadrant.  Musculoskeletal: Normal range of motion. She exhibits no edema and no tenderness.  Neurological: She is alert and oriented to person, place, and time.  Skin: Skin is warm and dry.  Psychiatric: She has a normal mood and affect. Her behavior is normal. Judgment and thought content normal.    ED Course  Procedures (including critical care time)  Labs Reviewed  POCT URINALYSIS DIP (DEVICE) - Abnormal; Notable for the following:    Hgb urine dipstick LARGE (*)    pH 8.5 (*)    Protein, ur 100 (*)    All other components within normal limits  POCT PREGNANCY, URINE   No results found.   1. Gastroenteritis   2. Abdominal pain     Results for orders placed during the hospital encounter of 12/10/11  POCT URINALYSIS DIP (DEVICE)      Component Value Range   Glucose, UA NEGATIVE  NEGATIVE (mg/dL)   Bilirubin Urine NEGATIVE  NEGATIVE    Ketones, ur NEGATIVE  NEGATIVE (mg/dL)   Specific Gravity, Urine 1.015  1.005 - 1.030    Hgb urine dipstick  LARGE (*) NEGATIVE    pH 8.5 (*) 5.0 - 8.0    Protein, ur 100 (*) NEGATIVE (mg/dL)   Urobilinogen, UA 1.0  0.0 - 1.0 (mg/dL)   Nitrite NEGATIVE  NEGATIVE    Leukocytes, UA NEGATIVE  NEGATIVE   POCT PREGNANCY, URINE      Component Value Range   Preg Test, Ur NEGATIVE  NEGATIVE      MDM  Previous records reviewed. Noncontributory.   Udip noted. Patient on menses. Pt has diffuse mild abdominal tenderness, particularly in the left lower quadrant.  no peritoneal signs. No evidence of surgical abd. Doubt SBO, mesenteric ischemia,  hepatitis,  pancreatitis, or perforated viscus. No evidence to support or suggest GYN pathology such as ovarian torsion or infection.  H&P most consistent with gastroenteritis, colitis. Patient is tolerating by mouth, patient not tachycardic, febrile, no evidence this is just signs of significant dehydration. Will provide Motrin, anti-emetic, Lomotil, bowel rest, clear liquid diet. Patient is to go to the ER if her pain changes in any way, if she does not get better in 24 hours, or for any other concerns. Patient  agrees with MDM, and plan.  Luiz Blare, MD 12/10/11 2230

## 2011-12-19 ENCOUNTER — Encounter (HOSPITAL_COMMUNITY): Payer: Self-pay | Admitting: Emergency Medicine

## 2011-12-19 ENCOUNTER — Emergency Department (HOSPITAL_COMMUNITY)
Admission: EM | Admit: 2011-12-19 | Discharge: 2011-12-19 | Disposition: A | Discharge status: Home / Self Care | Payer: Self-pay | Attending: Emergency Medicine | Admitting: Emergency Medicine

## 2011-12-19 DIAGNOSIS — F319 Bipolar disorder, unspecified: Secondary | ICD-10-CM | POA: Insufficient documentation

## 2011-12-19 DIAGNOSIS — J3489 Other specified disorders of nose and nasal sinuses: Secondary | ICD-10-CM | POA: Insufficient documentation

## 2011-12-19 DIAGNOSIS — R51 Headache: Secondary | ICD-10-CM | POA: Insufficient documentation

## 2011-12-19 DIAGNOSIS — J329 Chronic sinusitis, unspecified: Secondary | ICD-10-CM | POA: Insufficient documentation

## 2011-12-19 DIAGNOSIS — IMO0001 Reserved for inherently not codable concepts without codable children: Secondary | ICD-10-CM | POA: Insufficient documentation

## 2011-12-19 MED ORDER — HYDROCODONE-ACETAMINOPHEN 7.5-500 MG/15ML PO SOLN: 10.0000 mL | Freq: Four times a day (QID) | ORAL | Status: AC | PRN

## 2011-12-19 MED ORDER — AZITHROMYCIN 250 MG PO TABS: ORAL_TABLET | ORAL | Status: DC

## 2011-12-19 MED ORDER — AZITHROMYCIN 250 MG PO TABS
500.0000 mg | ORAL_TABLET | Freq: Once | ORAL | Status: AC
  Administered 2011-12-19: 500 mg via ORAL
  Filled 2011-12-19: qty 2

## 2011-12-19 MED ORDER — HYDROCODONE-ACETAMINOPHEN 7.5-500 MG/15ML PO SOLN
10.0000 mL | Freq: Once | ORAL | Status: AC
  Administered 2011-12-19: 10 mL via ORAL
  Filled 2011-12-19: qty 30

## 2011-12-19 NOTE — Discharge Instructions (Signed)

## 2011-12-19 NOTE — ED Provider Notes (Signed)
History     CSN: [id number]  Arrival date & time 12/19/11  1030   First MD Initiated Contact with Patient 12/19/11 1042      Chief Complaint  Patient presents with  . Nasal Congestion  . Generalized Body Aches  . Headache    (Consider location/radiation/quality/duration/timing/severity/associated sxs/prior treatment) Patient is a 36 y.o. female presenting with sinusitis. The history is provided by the patient. No language interpreter was used.  Sinusitis  This is a new problem. The current episode started yesterday. The problem has not changed since onset.There has been no fever. The pain is moderate. The pain has been constant since onset. Associated symptoms include congestion and sinus pressure. Pertinent negatives include no chills, no hoarse voice, no sore throat, no swollen glands, no cough and no shortness of breath. Associated symptoms comments: Body aches. She has tried nothing for the symptoms.    Past Medical History  Diagnosis Date  . Bipolar 1 disorder   . Migraines     Past Surgical History  Procedure Date  . Cesarean section   . Appendectomy   . Cholecystectomy     No family history on file.  History  Substance Use Topics  . Smoking status: Current Everyday Smoker    Types: Cigarettes  . Smokeless tobacco: Not on file  . Alcohol Use: No    OB History    Grav Para Term Preterm Abortions TAB SAB Ect Mult Living                  Review of Systems  Constitutional: Negative for chills, activity change and appetite change.  HENT: Positive for congestion, rhinorrhea and sinus pressure. Negative for sore throat, hoarse voice, trouble swallowing, neck pain and neck stiffness.   Eyes: Negative for visual disturbance.  Respiratory: Negative for cough and shortness of breath.   Gastrointestinal: Negative for nausea and vomiting.  Musculoskeletal: Positive for myalgias.  Skin: Negative.   Neurological: Positive for headaches. Negative for dizziness,  weakness and light-headedness.  Hematological: Negative for adenopathy.  All other systems reviewed and are negative.    Allergies  Penicillins  Home Medications   Current Outpatient Rx  Name Route Sig Dispense Refill  . BUTALBITAL-APAP-CAFF-COD 50-325-40-30 MG PO CAPS Oral Take 1 capsule by mouth every 4 (four) hours as needed.    Marland Kitchen DIPHENOXYLATE-ATROPINE 2.5-0.025 MG PO TABS Oral Take 1 tablet by mouth 4 (four) times daily as needed for diarrhea or loose stools. 30 tablet 0  . IBUPROFEN 600 MG PO TABS Oral Take 1 tablet (600 mg total) by mouth every 6 (six) hours as needed for pain. 30 tablet 0  . LAMOTRIGINE 200 MG PO TABS Oral Take 200 mg by mouth daily.    . ACIDOPHILUS PROBIOTIC BLEND PO CAPS Oral Take 2 capsules by mouth 2 (two) times daily. 60 capsule 0    BP 131/70  Pulse 94  Temp(Src) 99 F (37.2 C) (Oral)  Resp 18  Ht 5\' 2"  (1.575 m)  Wt 190 lb (86.183 kg)  BMI 34.75 kg/m2  SpO2 98%  LMP 12/06/2011  Physical Exam  Nursing note and vitals reviewed. Constitutional: She is oriented to person, place, and time. She appears well-developed and well-nourished. No distress.  HENT:  Head: Normocephalic and atraumatic.  Nose: Right sinus exhibits no maxillary sinus tenderness and no frontal sinus tenderness. Left sinus exhibits no maxillary sinus tenderness and no frontal sinus tenderness.  Mouth/Throat: Uvula is midline, oropharynx is clear and moist and mucous membranes  are normal.  Eyes: EOM are normal. Pupils are equal, round, and reactive to light.  Neck: Normal range of motion and phonation normal. Neck supple. No Brudzinski's sign and no Kernig's sign noted.  Cardiovascular: Normal rate, regular rhythm, normal heart sounds and intact distal pulses.   No murmur heard. Pulmonary/Chest: Effort normal and breath sounds normal. No respiratory distress.  Musculoskeletal: Normal range of motion. She exhibits no edema.  Lymphadenopathy:    She has no cervical adenopathy.    Neurological: She is alert and oriented to person, place, and time. She exhibits normal muscle tone. Coordination normal.  Skin: Skin is warm and dry.  Psychiatric: She has a normal mood and affect.    ED Course  Procedures (including critical care time)       MDM     Patient is alert to, no acute distress, vital signs are stable, no hypoxia. Patient has tenderness to palpation of her frontal and maxillary sinuses bilaterally. Moderate edema of the nasal mucosa. No focal neuro deficits. Airway is patent. No meningeal signs. Symptoms are likely related to sinusitis. I will prescribe antibiotics and a short course of pain medication. She agrees to close followup with her primary care physician if her symptoms are not improving  Patient / Family / Caregiver understand and agree with initial ED impression and plan with expectations set for ED visit. Pt stable in ED with no significant deterioration in condition. Pt feels improved after observation and/or treatment in ED.          Jamesmichael Shadd L. [place], [place] 12/22/11 1723

## 2011-12-19 NOTE — ED Notes (Signed)
Pt states awoke yesterday sneezing and cold/sinus pressure along with body aches worsened over the day. Pt denies fever, SOB and cough at this time. BBS clear and equal. Pt is alert and orientated x 4, pleasant and cooperative.

## 2011-12-19 NOTE — ED Notes (Signed)
Pt c/o nasal congestion, cough and body aches since yesterday.

## 2011-12-23 NOTE — ED Provider Notes (Signed)
Medical screening examination/treatment/procedure(s) were performed by non-physician practitioner and as supervising physician I was immediately available for consultation/collaboration.   Chakita Mcgraw W Chalmers Iddings, MD 12/23/11 0735 

## 2012-02-01 ENCOUNTER — Encounter (HOSPITAL_COMMUNITY): Payer: Self-pay | Admitting: *Deleted

## 2012-02-01 ENCOUNTER — Emergency Department (HOSPITAL_COMMUNITY)
Admission: EM | Admit: 2012-02-01 | Discharge: 2012-02-01 | Disposition: A | Discharge status: Home / Self Care | Payer: Self-pay | Attending: Emergency Medicine | Admitting: Emergency Medicine

## 2012-02-01 DIAGNOSIS — X58XXXA Exposure to other specified factors, initial encounter: Secondary | ICD-10-CM | POA: Insufficient documentation

## 2012-02-01 DIAGNOSIS — IMO0002 Reserved for concepts with insufficient information to code with codable children: Secondary | ICD-10-CM | POA: Insufficient documentation

## 2012-02-01 DIAGNOSIS — S39012A Strain of muscle, fascia and tendon of lower back, initial encounter: Secondary | ICD-10-CM

## 2012-02-01 DIAGNOSIS — M5416 Radiculopathy, lumbar region: Secondary | ICD-10-CM

## 2012-02-01 DIAGNOSIS — S335XXA Sprain of ligaments of lumbar spine, initial encounter: Secondary | ICD-10-CM | POA: Insufficient documentation

## 2012-02-01 MED ORDER — HYDROCODONE-ACETAMINOPHEN 5-325 MG PO TABS
1.0000 | ORAL_TABLET | Freq: Once | ORAL | Status: DC
  Filled 2012-02-01: qty 1

## 2012-02-01 MED ORDER — HYDROCODONE-ACETAMINOPHEN 5-325 MG PO TABS
ORAL_TABLET | ORAL | Status: DC
Start: 2012-02-01 — End: 2012-03-04

## 2012-02-01 MED ORDER — PREDNISONE 20 MG PO TABS
60.0000 mg | ORAL_TABLET | Freq: Once | ORAL | Status: DC
  Filled 2012-02-01: qty 3

## 2012-02-01 MED ORDER — PREDNISONE 50 MG PO TABS: 50.0000 mg | ORAL_TABLET | Freq: Every day | ORAL | Status: AC

## 2012-02-01 NOTE — Discharge Instructions (Signed)
Back Pain, Adult Low back pain is very common. About 1 in 5 people have back pain.The cause of low back pain is rarely dangerous. The pain often gets better over time.About half of people with a sudden onset of back pain feel better in just 2 weeks. About 8 in 10 people feel better by 6 weeks.  CAUSES Some common causes of back pain include:  Strain of the muscles or ligaments supporting the spine.   Wear and tear (degeneration) of the spinal discs.   Arthritis.   Direct injury to the back.  DIAGNOSIS Most of the time, the direct cause of low back pain is not known.However, back pain can be treated effectively even when the exact cause of the pain is unknown.Answering your caregiver's questions about your overall health and symptoms is one of the most accurate ways to make sure the cause of your pain is not dangerous. If your caregiver needs more information, he or she may order lab work or imaging tests (X-rays or MRIs).However, even if imaging tests show changes in your back, this usually does not require surgery. HOME CARE INSTRUCTIONS For many people, back pain returns.Since low back pain is rarely dangerous, it is often a condition that people can learn to manageon their own.   Remain active. It is stressful on the back to sit or stand in one place. Do not sit, drive, or stand in one place for more than 30 minutes at a time. Take short walks on level surfaces as soon as pain allows.Try to increase the length of time you walk each day.   Do not stay in bed.Resting more than 1 or 2 days can delay your recovery.   Do not avoid exercise or work.Your body is made to move.It is not dangerous to be active, even though your back may hurt.Your back will likely heal faster if you return to being active before your pain is gone.   Pay attention to your body when you bend and lift. Many people have less discomfortwhen lifting if they bend their knees, keep the load close to their  bodies,and avoid twisting. Often, the most comfortable positions are those that put less stress on your recovering back.   Find a comfortable position to sleep. Use a firm mattress and lie on your side with your knees slightly bent. If you lie on your back, put a pillow under your knees.   Only take over-the-counter or prescription medicines as directed by your caregiver. Over-the-counter medicines to reduce pain and inflammation are often the most helpful.Your caregiver may prescribe muscle relaxant drugs.These medicines help dull your pain so you can more quickly return to your normal activities and healthy exercise.   Put ice on the injured area.   Put ice in a plastic bag.   Place a towel between your skin and the bag.   Leave the ice on for 15 to 20 minutes, 3 to 4 times a day for the first 2 to 3 days. After that, ice and heat may be alternated to reduce pain and spasms.   Ask your caregiver about trying back exercises and gentle massage. This may be of some benefit.   Avoid feeling anxious or stressed.Stress increases muscle tension and can worsen back pain.It is important to recognize when you are anxious or stressed and learn ways to manage it.Exercise is a great option.  SEEK MEDICAL CARE IF:  You have pain that is not relieved with rest or medicine.   You have   pain that does not improve in 1 week.   You have new symptoms.   You are generally not feeling well.  SEEK IMMEDIATE MEDICAL CARE IF:   You have pain that radiates from your back into your legs.   You develop new bowel or bladder control problems.   You have unusual weakness or numbness in your arms or legs.   You develop nausea or vomiting.   You develop abdominal pain.   You feel faint.  Document Released: 09/14/2005 Document Revised: 09/03/2011 Document Reviewed: 02/02/2011 [facility] Patient Information 2012 [place], [place].   Take the meds as directed.  Do NOT take any NSAID's while taking the  prednisone.  Apply ice several times daily to lower back.

## 2012-02-01 NOTE — ED Provider Notes (Signed)
History     CSN: [id number]  Arrival date & time 02/01/12  1306   First MD Initiated Contact with Patient 02/01/12 1518      Chief Complaint  Patient presents with  . Back Pain    (Consider location/radiation/quality/duration/timing/severity/associated sxs/prior treatment) HPI Comments: No injury.  Pain radiates to L knee.  Taking ibuprofen without relief.  Patient is a 36 y.o. female presenting with back pain. The history is provided by the patient. No language interpreter was used.  Back Pain  This is a new problem. Episode onset: 2 days ago. The problem occurs constantly. The pain is associated with no known injury. The pain is present in the lumbar spine. The pain radiates to the left knee. The symptoms are aggravated by twisting and bending. The pain is the same all the time. Associated symptoms include leg pain. Pertinent negatives include no numbness, no perianal numbness, no paresthesias, no paresis, no tingling and no weakness.    Past Medical History  Diagnosis Date  . Bipolar 1 disorder   . Migraines     Past Surgical History  Procedure Date  . Cesarean section   . Appendectomy   . Cholecystectomy     History reviewed. No pertinent family history.  History  Substance Use Topics  . Smoking status: Current Everyday Smoker    Types: Cigarettes  . Smokeless tobacco: Not on file  . Alcohol Use: No    OB History    Grav Para Term Preterm Abortions TAB SAB Ect Mult Living                  Review of Systems  Musculoskeletal: Positive for back pain.  Neurological: Negative for tingling, weakness, numbness and paresthesias.  All other systems reviewed and are negative.    Allergies  Penicillins  Home Medications   Current Outpatient Rx  Name Route Sig Dispense Refill  . IBUPROFEN 200 MG PO TABS Oral Take 800 mg by mouth 4 (four) times daily as needed. Take four tablets by mouth 3 to 4 times daily as needed for pain    . LAMOTRIGINE 200 MG PO TABS Oral  Take 200 mg by mouth every morning.     . ACIDOPHILUS PROBIOTIC BLEND PO CAPS Oral Take 2 capsules by mouth 2 (two) times daily.    Marland Kitchen HYDROCODONE-ACETAMINOPHEN 5-325 MG PO TABS  One tab po q 4-6 hrs prn pain 20 tablet 0  . PREDNISONE 50 MG PO TABS Oral Take 1 tablet (50 mg total) by mouth daily. 6 tablet 0    BP 122/80  Pulse 109  Temp(Src) 98.6 F (37 C) (Oral)  Resp 20  Ht 5\' 2"  (1.575 m)  Wt 199 lb (90.266 kg)  BMI 36.40 kg/m2  SpO2 98%  LMP 01/08/2012  Physical Exam  Nursing note and vitals reviewed. Constitutional: She is oriented to person, place, and time. She appears well-developed and well-nourished. No distress.  HENT:  Head: Normocephalic and atraumatic.  Eyes: EOM are normal.  Neck: Normal range of motion.  Cardiovascular: Normal rate, regular rhythm and normal heart sounds.   Pulmonary/Chest: Effort normal and breath sounds normal.  Abdominal: Soft. She exhibits no distension. There is no tenderness.  Musculoskeletal: She exhibits tenderness.       Lumbar back: She exhibits decreased range of motion and tenderness. She exhibits no bony tenderness.       Back:  Neurological: She is alert and oriented to person, place, and time.  Skin: Skin is warm  and dry.  Psychiatric: She has a normal mood and affect. Judgment normal.    ED Course  Procedures (including critical care time)  Labs Reviewed - No data to display No results found.   1. Lumbar strain   2. Lumbar radiculopathy       MDM  No saddle dysthesia.  History and exam typical of sciatica assoc with low back pain. rx-prednisone 50 mg, #6 rx-hydrocodone, #20 ice        Worthy Rancher, [place] 02/01/12 1551

## 2012-02-01 NOTE — ED Notes (Signed)
Low back pain with radiation down lt leg.  No recent injury

## 2012-02-01 NOTE — ED Notes (Signed)
Pt DC to home with steady gait in the care of family

## 2012-02-01 NOTE — ED Provider Notes (Signed)
Medical screening examination/treatment/procedure(s) were performed by non-physician practitioner and as supervising physician I was immediately available for consultation/collaboration. Devoria Albe, MD, Armando Gang   Ward Givens, MD 02/01/12 [phone]

## 2012-02-05 ENCOUNTER — Encounter (HOSPITAL_COMMUNITY): Payer: Self-pay | Admitting: *Deleted

## 2012-02-05 ENCOUNTER — Emergency Department (HOSPITAL_COMMUNITY): Payer: Medicaid Other

## 2012-02-05 ENCOUNTER — Emergency Department (HOSPITAL_COMMUNITY)
Admission: EM | Admit: 2012-02-05 | Discharge: 2012-02-05 | Disposition: A | Discharge status: Home / Self Care | Payer: Medicaid Other | Attending: Emergency Medicine | Admitting: Emergency Medicine

## 2012-02-05 DIAGNOSIS — R0602 Shortness of breath: Secondary | ICD-10-CM | POA: Insufficient documentation

## 2012-02-05 DIAGNOSIS — F319 Bipolar disorder, unspecified: Secondary | ICD-10-CM | POA: Insufficient documentation

## 2012-02-05 DIAGNOSIS — R062 Wheezing: Secondary | ICD-10-CM | POA: Insufficient documentation

## 2012-02-05 DIAGNOSIS — R05 Cough: Secondary | ICD-10-CM | POA: Insufficient documentation

## 2012-02-05 DIAGNOSIS — R5381 Other malaise: Secondary | ICD-10-CM | POA: Insufficient documentation

## 2012-02-05 DIAGNOSIS — Z79899 Other long term (current) drug therapy: Secondary | ICD-10-CM | POA: Insufficient documentation

## 2012-02-05 DIAGNOSIS — E669 Obesity, unspecified: Secondary | ICD-10-CM | POA: Insufficient documentation

## 2012-02-05 DIAGNOSIS — R059 Cough, unspecified: Secondary | ICD-10-CM | POA: Insufficient documentation

## 2012-02-05 DIAGNOSIS — R5383 Other fatigue: Secondary | ICD-10-CM | POA: Insufficient documentation

## 2012-02-05 DIAGNOSIS — J40 Bronchitis, not specified as acute or chronic: Secondary | ICD-10-CM | POA: Insufficient documentation

## 2012-02-05 MED ORDER — AZITHROMYCIN 250 MG PO TABS: 250.0000 mg | ORAL_TABLET | Freq: Every day | ORAL | Status: AC

## 2012-02-05 MED ORDER — ALBUTEROL SULFATE HFA 108 (90 BASE) MCG/ACT IN AERS
2.0000 | INHALATION_SPRAY | RESPIRATORY_TRACT | Status: DC | PRN
  Administered 2012-02-05: 2 via RESPIRATORY_TRACT
  Filled 2012-02-05: qty 6.7

## 2012-02-05 MED ORDER — AZITHROMYCIN 250 MG PO TABS
500.0000 mg | ORAL_TABLET | Freq: Once | ORAL | Status: AC
  Administered 2012-02-05: 500 mg via ORAL
  Filled 2012-02-05: qty 2

## 2012-02-05 MED ORDER — IPRATROPIUM BROMIDE 0.02 % IN SOLN
0.5000 mg | Freq: Once | RESPIRATORY_TRACT | Status: AC
  Administered 2012-02-05: 0.5 mg via RESPIRATORY_TRACT
  Filled 2012-02-05: qty 2.5

## 2012-02-05 MED ORDER — ALBUTEROL SULFATE (5 MG/ML) 0.5% IN NEBU
5.0000 mg | INHALATION_SOLUTION | Freq: Once | RESPIRATORY_TRACT | Status: AC
  Administered 2012-02-05: 5 mg via RESPIRATORY_TRACT
  Filled 2012-02-05: qty 1

## 2012-02-05 MED ORDER — HYDROCOD POLST-CHLORPHEN POLST 10-8 MG/5ML PO LQCR
5.0000 mL | Freq: Once | ORAL | Status: AC
  Administered 2012-02-05: 5 mL via ORAL
  Filled 2012-02-05: qty 5

## 2012-02-05 MED ORDER — HYDROCOD POLST-CHLORPHEN POLST 10-8 MG/5ML PO LQCR: 5.0000 mL | Freq: Two times a day (BID) | ORAL | Status: DC

## 2012-02-05 NOTE — ED Provider Notes (Signed)
History   This chart was scribed for Donnetta Hutching, MD by Shari Heritage. The patient was seen in room APA19/APA19. Patient's care was started at 1605.     CSN: [id number]  Arrival date & time 02/05/12  1605   First MD Initiated Contact with Patient 02/05/12 1621      Chief Complaint  Patient presents with  . Cough    (Consider location/radiation/quality/duration/timing/severity/associated sxs/prior treatment) HPI Shelby Hatfield is a 36 y.o. female who presents to the Emergency Department complaining of a moderate to severe, constant productive cough onset 2 days ago associated with SOB and wheezing. Patient suspected that cough was due to allergies, so she took an over the counter allergy medication with no relief. The following day she experienced SOB and wheezing which caused fatigue after ambulation and other activity. Nurse reports that patient was seen at [facility] today and was told she has a fluid pocket at the bases of one of her lungs, a productive cough, sinus pain in lower ribs, and chest congestion with no fever. Patient also denies fever, nausea and vomiting. Patient with h/o of high cholesterol.  Past Medical History  Diagnosis Date  . Bipolar 1 disorder   . Migraines   . Migraine     Past Surgical History  Procedure Date  . Cesarean section   . Appendectomy   . Cholecystectomy     History reviewed. No pertinent family history.  History  Substance Use Topics  . Smoking status: Current Everyday Smoker    Types: Cigarettes  . Smokeless tobacco: Not on file  . Alcohol Use: No    OB History    Grav Para Term Preterm Abortions TAB SAB Ect Mult Living                  Review of Systems A complete 10 system review of systems was obtained and all systems are negative except as noted in the HPI and PMH.   Allergies  Penicillins  Home Medications   Current Outpatient Rx  Name Route Sig Dispense Refill  . HYDROCODONE-ACETAMINOPHEN 5-325 MG PO  TABS  One tab po q 4-6 hrs prn pain 20 tablet 0  . IBUPROFEN 200 MG PO TABS Oral Take 800 mg by mouth 4 (four) times daily as needed. Take four tablets by mouth 3 to 4 times daily as needed for pain    . LAMOTRIGINE 200 MG PO TABS Oral Take 200 mg by mouth every morning.     Marland Kitchen PREDNISONE 50 MG PO TABS Oral Take 1 tablet (50 mg total) by mouth daily. 6 tablet 0  . ACIDOPHILUS PROBIOTIC BLEND PO CAPS Oral Take 2 capsules by mouth 2 (two) times daily.    . AZITHROMYCIN 250 MG PO TABS Oral Take 1 tablet (250 mg total) by mouth daily. 4 tablet 0    Start Saturday evening  . HYDROCOD POLST-CPM POLST ER 10-8 MG/5ML PO LQCR Oral Take 5 mLs by mouth every 12 (twelve) hours. 120 mL 0    BP 115/73  Pulse 78  Temp(Src) 98.9 F (37.2 C) (Oral)  Resp 24  SpO2 97%  LMP 02/04/2012  Physical Exam  Nursing note and vitals reviewed. Constitutional: She is oriented to person, place, and time. She appears well-developed and well-nourished.       Obese  HENT:  Head: Normocephalic and atraumatic.  Eyes: Conjunctivae and EOM are normal. Pupils are equal, round, and reactive to light.  Neck: Normal range of motion. Neck  supple.  Cardiovascular: Normal rate and regular rhythm.   Pulmonary/Chest: Effort normal. She has wheezes (Mild bilateral expiratory wheezes).       Mild bilateral expiratory wheezes  Abdominal: Soft. Bowel sounds are normal.  Musculoskeletal: Normal range of motion.  Neurological: She is alert and oriented to person, place, and time.  Skin: Skin is warm and dry.  Psychiatric: She has a normal mood and affect.    ED Course  Procedures (including critical care time) DIAGNOSTIC STUDIES: Oxygen Saturation is 97% on room air, adequate by my interpretation.    COORDINATION OF CARE: 4:41PM- Will administer an Albuterol Atrovent treatment and have ordered a chest X-ray.  Labs Reviewed - No data to display Dg Chest 2 View  02/05/2012  *RADIOLOGY REPORT*  Clinical Data: Cough for 2 days.   CHEST - 2 VIEW  Comparison: Plain films of the chest 07/01/2008.  Findings: Elevation of the left hemidiaphragm is again seen. Linear opacity in the left lung base is likely due to atelectasis. Right lung is clear.  No pneumothorax or pleural effusion.  IMPRESSION: Elevation of the left hemidiaphragm with linear left basilar opacity likely due to atelectasis.  Original Report Authenticated By: Bernadene Bell. D'ALESSIO, M.D.     1. Bronchitis       MDM  Chest x-ray reviewed. I suspect an early left-sided pneumonia clinically.  Will start Zithromax, Tussionex, albuterol. Patient is hemodynamically stable. I personally performed the services described in this documentation, which was scribed in my presence. The recorded information has been reviewed and considered.           Donnetta Hutching, MD 02/05/12 2102

## 2012-02-05 NOTE — ED Notes (Signed)
Pt a/ox4. Resp even and unlabored. NAD at this time. D/C instructions reviewed with pt. Pt verbalized understanding. Pt ambulated to lobby with steady gate.  

## 2012-02-05 NOTE — ED Notes (Signed)
Cough, body aches, vomiting, Seen at Urgent Care ,  Sent here with chest  X ray from Urgent care.  For tx  No fever.

## 2012-02-05 NOTE — ED Notes (Signed)
RT paged for breathing treatment

## 2012-02-05 NOTE — Discharge Instructions (Signed)
Start the remainder of your antibiotic tomorrow evening. Cough syrup. No smoking. Increase fluids. Tylenol.  Also use your inhaler as needed

## 2012-03-04 ENCOUNTER — Emergency Department (HOSPITAL_COMMUNITY)
Admission: EM | Admit: 2012-03-04 | Discharge: 2012-03-05 | Disposition: A | Discharge status: Home / Self Care | Payer: Medicaid Other | Attending: Emergency Medicine | Admitting: Emergency Medicine

## 2012-03-04 ENCOUNTER — Encounter (HOSPITAL_COMMUNITY): Payer: Self-pay | Admitting: *Deleted

## 2012-03-04 DIAGNOSIS — R070 Pain in throat: Secondary | ICD-10-CM | POA: Insufficient documentation

## 2012-03-04 DIAGNOSIS — J358 Other chronic diseases of tonsils and adenoids: Secondary | ICD-10-CM | POA: Insufficient documentation

## 2012-03-04 DIAGNOSIS — J029 Acute pharyngitis, unspecified: Secondary | ICD-10-CM

## 2012-03-04 DIAGNOSIS — F319 Bipolar disorder, unspecified: Secondary | ICD-10-CM | POA: Insufficient documentation

## 2012-03-04 DIAGNOSIS — F17200 Nicotine dependence, unspecified, uncomplicated: Secondary | ICD-10-CM | POA: Insufficient documentation

## 2012-03-04 NOTE — ED Notes (Signed)
Pt with sore throat x 2 days and is saying that the back of her throat "feels like razorblades."  Pt denies any fever, but does feel nauseous.  No medications given PTA.  NAD.  Daughter has same symptoms.

## 2012-03-05 MED ORDER — CLINDAMYCIN HCL 300 MG PO CAPS: 300.0000 mg | ORAL_CAPSULE | Freq: Three times a day (TID) | ORAL | Status: AC

## 2012-03-05 MED ORDER — HYDROCODONE-ACETAMINOPHEN 5-325 MG PO TABS: 1.0000 | ORAL_TABLET | Freq: Four times a day (QID) | ORAL | Status: DC | PRN

## 2012-03-05 MED ORDER — MAGIC MOUTHWASH W/LIDOCAINE: 10.0000 mL | Freq: Three times a day (TID) | ORAL | Status: DC | PRN

## 2012-03-05 MED ORDER — HYDROCODONE-ACETAMINOPHEN 5-325 MG PO TABS: 1.0000 | ORAL_TABLET | Freq: Four times a day (QID) | ORAL | Status: AC | PRN

## 2012-03-05 MED ORDER — GI COCKTAIL ~~LOC~~
30.0000 mL | Freq: Once | ORAL | Status: AC
  Administered 2012-03-05: 30 mL via ORAL
  Filled 2012-03-05: qty 30

## 2012-03-05 MED ORDER — CLINDAMYCIN HCL 300 MG PO CAPS: 300.0000 mg | ORAL_CAPSULE | Freq: Three times a day (TID) | ORAL | Status: DC

## 2012-03-05 NOTE — Discharge Instructions (Signed)
You were seen and evaluated for your complaints of sore throat and pain. Your strep throat test was negative today. Your providers feel like you're symptoms are caused from small stones causing irritation of your tonsils or throat. Your providers were also concerned for the possibility use of infection related to this. You've been given an antibiotic to take to prevent an infection. You have also been given pain medication to use for your symptoms. Please followup with an Ear, Nose and Throat specialist for continued evaluation and treatment.   Sore Throat Sore throats may be caused by bacteria and viruses. They may also be caused by:  Smoking.   Pollution.   Allergies.  If a sore throat is due to strep infection (a bacterial infection), you may need:  A throat swab.   A culture test to verify the strep infection.  You will need one of these:  An antibiotic shot.   Oral medicine for a full 10 days.  Strep infection is very contagious. A doctor should check any close contacts who have a sore throat or fever. A sore throat caused by a virus infection will usually last only 3-4 days. Antibiotics will not treat a viral sore throat.  Infectious mononucleosis (a viral disease), however, can cause a sore throat that lasts for up to 3 weeks. Mononucleosis can be diagnosed with blood tests. You must have been sick for at least 1 week in order for the test to give accurate results. HOME CARE INSTRUCTIONS   To treat a sore throat, take mild pain medicine.   Increase your fluids.   Eat a soft diet.   Do not smoke.   Gargling with warm water or salt water (1 tsp. salt in 8 oz. water) can be helpful.   Try throat sprays or lozenges or sucking on hard candy to ease the symptoms.  Call your doctor if your sore throat lasts longer than 1 week.  SEEK IMMEDIATE MEDICAL CARE IF:  You have difficulty breathing.   You have increased swelling in the throat.   You have pain so severe that you are  unable to swallow fluids or your saliva.   You have a severe headache, a high fever, vomiting, or a red rash.  Document Released: 10/22/2004 Document Revised: 09/03/2011 Document Reviewed: 09/01/2007 [facility] Patient Information 2012 [place], [place].    RESOURCE GUIDE  Chronic Pain Problems: Contact [facility]  [phone] Patients need to be referred by their primary care doctor.  Insufficient Money for Medicine: Contact United Way:  call "211" or [facility] [phone].  No Primary Care Doctor: - Call [facility] Connect  [phone] - can help you locate a primary care doctor that  accepts your insurance, provides certain services, etc. - Physician Referral [phone]  Agencies that provide inexpensive medical care: - [facility]  [phone] - [facility]  [phone] - [facility]  [phone] [facility]  [phone] - Planned Parenthood  [phone] [facility]  [phone]  Medicaid-accepting [facility] Providers: - [facility]- [address]  [phone], Mon-Fri 9am-7pm, Sat 9am-1pm - [facility]- [address] [place], [address] [place]  [phone] - [facility]- [address] [place]  [phone] [facility]- [address]  [phone] - Renaye Rakers- [address] [place], [address], [phone]  Only accepts [place] patients after they  have their name  applied to their card  Self Pay (no insurance) in [facility]: - Sickle Cell Patients: Dr Willey Blade, [facility]  [address] [place], [phone] - [facility]- [address] [place]  [phone]       Patrcia Dolly [facility] [place]- [address]       -     [facility]- see information above (Speak to [employer] if you do not have insurance)       -  [facility]- [address] [place], [phone]       -  [facility]- [address],  [phone]       -  [facility]- [address], [phone]       -  Dr Julio Sicks-  [address], [place], [phone]       -  [facility]- [address] [phone]       -  [facility]- [address] [phone], also [address], [phone]       -    [facility]- [address] [place], [phone], 1st & 3rd Saturday   every month, 10am-1pm  1) Find a Doctor and Pay Out of Pocket Although you won't have to find out who is covered by your insurance plan, it is a good idea to ask around and get recommendations. You will then need to call the office and see if the doctor you have chosen will accept you as a new patient and what types of options they offer for patients who are self-pay. Some doctors offer discounts or will set up payment plans for their patients who do not have insurance, but you will need to ask so you aren't surprised when you get to your appointment.  2) Contact Your Local Health Department Not all health departments have doctors that can see patients for sick visits, but many do, so it is worth a call to see if yours does. If you don't know where your local health department is, you can check in your phone book. The CDC also has a tool to help you locate your state's health department, and many state websites also have listings of all of their local health departments.  3) Find a Walk-in Clinic If your illness is not likely to be very severe or complicated, you may want to try a walk in clinic. These are popping up all over the country in pharmacies, drugstores, and shopping centers. They're usually staffed by nurse practitioners or physician assistants that have been trained to treat common illnesses and complaints. They're usually fairly quick and inexpensive. However, if you have serious medical issues or chronic medical problems,  these are probably not your best option  STD Testing - [facility] Department of [facility], STD Clinic, [address] [place], phone [phone] or [phone].  Monday - Friday, call for an appointment. [facility] of [employer], STD Clinic, [address], [place], phone [phone] or [phone].  Monday - Friday, call for an appointment.  Abuse/Neglect: [facility] Child Abuse Hotline 5184809657 [facility] Child Abuse Hotline 253-827-4531 (After Hours)  Emergency Shelter:  [facility] (939) 511-0249  Maternity Homes: - [facility] [place] [facility] [phone] - [facility] [phone])  [phone]  MRSA Hotline #:   202-346-7047  [facility] Resources  [facility]  United Way [facility] Dept. [address]                 [address]         [address] [place] [address]  Blondell Reveal Phone:  [phone]                                  Phone:  [phone]                   Phone:  [phone]  [facility], [phone] - [facility] - [phone]       -     [facility] in [place], [address],                                  ([phone], [facility] Child Abuse Hotline [phone] or [phone] (After Hours)   Behavioral Health Services  Substance Abuse Resources: - [facility] Drug [facility]  8283428776 - [facility] [phone] - [facility] [phone] Floydene Flock [phone] - Residential & Outpatient Substance Abuse Program  ([phone]) [phone]  Psychological Services: [facility]  ([phone] Services  [phone] - [facility], [phone] [place]. [address], [place],  ACCESS LINE: 253-126-6096 or (646) 269-6538, [employer]za  Dental Assistance  If unable to pay or uninsured, contact:  [facility] or [facility]. to become qualified for the adult dental clinic.  Patients with Medicaid: [facility] [phone] [address], [phone] [address], [phone]  If unable to pay, or uninsured, contact [facility] [phone]) or [facility] [phone] in [place], [phone] in [facility]) to become qualified for the adult dental clinic  Other Low-Cost Community Dental Services: - [facility]- [address] [place], [place], [place], [address], [phone], Ext. [phone], 2nd and 4th Thursday of the month at 6:30am.  10 clients each day by appointment, can sometimes see walk-in patients if someone does not show for an appointment. [facility]- [address] Ether Griffins [place], [place], [address], [phone] - [facility]- [address] [place], [place], [address], [phone] - [facility]- [phone] [facility]- [phone] [facility] Department- [phone]

## 2012-03-05 NOTE — ED Provider Notes (Signed)
Medical screening examination/treatment/procedure(s) were performed by non-physician practitioner and as supervising physician I was immediately available for consultation/collaboration.   Lyanne Co, MD 03/05/12 [phone]

## 2012-03-05 NOTE — ED Provider Notes (Signed)
History     CSN: [id number]  Arrival date & time 03/04/12  2317   First MD Initiated Contact with Patient 03/05/12 [phone]      Chief Complaint  Patient presents with  . Sore Throat    HPI  History provided by the patient. Patient is a 36 year old female who presents with complaints of sore throat for the past 2 days. Patient states the pain has become worse and feels like sharp razor blades cutting the back of her throat. Pain is worse with any swallowing. Patient denies any difficulty swallowing or difficulty breathing. Patient does report having some increased congestion and allergy symptoms. She takes daily over-the-counter allergy medication. She also reports that her daughter was recently sick with cough but did not have any sore throat symptoms. Patient denies any other symptoms. She denies any fever, chills, sweats, nausea or vomiting.    Past Medical History  Diagnosis Date  . Bipolar 1 disorder   . Migraines   . Migraine     Past Surgical History  Procedure Date  . Cesarean section   . Appendectomy   . Cholecystectomy     History reviewed. No pertinent family history.  History  Substance Use Topics  . Smoking status: Current Everyday Smoker    Types: Cigarettes  . Smokeless tobacco: Not on file  . Alcohol Use: No    OB History    Grav Para Term Preterm Abortions TAB SAB Ect Mult Living                  Review of Systems  Constitutional: Negative for fever, chills and appetite change.  HENT: Positive for congestion and sore throat.   Respiratory: Negative for cough.   Gastrointestinal: Negative for nausea and vomiting.  Skin: Negative for rash.    Allergies  Penicillins  Home Medications   Current Outpatient Rx  Name Route Sig Dispense Refill  . LAMOTRIGINE 200 MG PO TABS Oral Take 200 mg by mouth every morning.     . ACIDOPHILUS PROBIOTIC BLEND PO CAPS Oral Take 2 capsules by mouth 2 (two) times daily.      BP 134/78  Pulse 97  Temp(Src)  97.6 F (36.4 C) (Oral)  Resp 16  SpO2 96%  LMP 02/04/2012  Physical Exam  Nursing note and vitals reviewed. Constitutional: She is oriented to person, place, and time. She appears well-developed and well-nourished. No distress.  HENT:  Head: Normocephalic and atraumatic.       Tonsils are enlarged with slight erythema. Tonsils are cavitated with some tonsilliths present. Uvula midline. Voice normal. No signs for PTA.  Neck: Normal range of motion. Neck supple.       No meningeal signs  Cardiovascular: Normal rate and regular rhythm.   Pulmonary/Chest: Effort normal and breath sounds normal. She has no wheezes. She has no rales.  Lymphadenopathy:    She has cervical adenopathy.  Neurological: She is alert and oriented to person, place, and time.  Skin: Skin is warm and dry.  Psychiatric: She has a normal mood and affect. Her behavior is normal.    ED Course  Procedures   Results for orders placed during the hospital encounter of 03/04/12  RAPID STREP SCREEN      Component Value Range   Streptococcus, Group A Screen (Direct) NEGATIVE  NEGATIVE         1. Sore throat   2. Tonsil stone       MDM  Patient seen and evaluated. Patient  in no acute distress.        Angus Seller, [place] 03/05/12 [phone]

## 2012-03-28 DIAGNOSIS — J353 Hypertrophy of tonsils with hypertrophy of adenoids: Secondary | ICD-10-CM

## 2012-03-28 HISTORY — DX: Hypertrophy of tonsils with hypertrophy of adenoids: J35.3

## 2012-04-05 ENCOUNTER — Encounter (HOSPITAL_BASED_OUTPATIENT_CLINIC_OR_DEPARTMENT_OTHER): Payer: Self-pay | Admitting: *Deleted

## 2012-04-12 ENCOUNTER — Ambulatory Visit (HOSPITAL_BASED_OUTPATIENT_CLINIC_OR_DEPARTMENT_OTHER)
Admission: RE | Admit: 2012-04-12 | Discharge: 2012-04-12 | Disposition: A | Discharge status: Home / Self Care | Payer: Medicaid Other | Source: Ambulatory Visit | Attending: Otolaryngology | Admitting: Otolaryngology

## 2012-04-12 ENCOUNTER — Ambulatory Visit (HOSPITAL_BASED_OUTPATIENT_CLINIC_OR_DEPARTMENT_OTHER): Payer: Medicaid Other | Admitting: Anesthesiology

## 2012-04-12 ENCOUNTER — Encounter (HOSPITAL_BASED_OUTPATIENT_CLINIC_OR_DEPARTMENT_OTHER): Payer: Self-pay | Admitting: Anesthesiology

## 2012-04-12 ENCOUNTER — Encounter (HOSPITAL_BASED_OUTPATIENT_CLINIC_OR_DEPARTMENT_OTHER)
Admission: RE | Disposition: A | Discharge status: Home / Self Care | Payer: Self-pay | Source: Ambulatory Visit | Attending: Otolaryngology

## 2012-04-12 ENCOUNTER — Encounter (HOSPITAL_BASED_OUTPATIENT_CLINIC_OR_DEPARTMENT_OTHER): Payer: Self-pay | Admitting: *Deleted

## 2012-04-12 DIAGNOSIS — J312 Chronic pharyngitis: Secondary | ICD-10-CM | POA: Insufficient documentation

## 2012-04-12 DIAGNOSIS — Z9089 Acquired absence of other organs: Secondary | ICD-10-CM

## 2012-04-12 DIAGNOSIS — F319 Bipolar disorder, unspecified: Secondary | ICD-10-CM | POA: Insufficient documentation

## 2012-04-12 DIAGNOSIS — R51 Headache: Secondary | ICD-10-CM | POA: Insufficient documentation

## 2012-04-12 DIAGNOSIS — J3501 Chronic tonsillitis: Secondary | ICD-10-CM | POA: Insufficient documentation

## 2012-04-12 HISTORY — DX: Presence of dental prosthetic device (complete) (partial): Z97.2

## 2012-04-12 HISTORY — DX: Hypertrophy of tonsils with hypertrophy of adenoids: J35.3

## 2012-04-12 HISTORY — PX: TONSILLECTOMY AND ADENOIDECTOMY: SHX28

## 2012-04-12 LAB — POCT HEMOGLOBIN-HEMACUE: Hemoglobin: 11.4 g/dL — ABNORMAL LOW (ref 12.0–15.0)

## 2012-04-12 SURGERY — TONSILLECTOMY AND ADENOIDECTOMY
Anesthesia: General | Site: Throat | Laterality: Bilateral | Wound class: Clean Contaminated

## 2012-04-12 MED ORDER — SUCCINYLCHOLINE CHLORIDE 20 MG/ML IJ SOLN
INTRAMUSCULAR | Status: DC | PRN
  Administered 2012-04-12: 100 mg via INTRAVENOUS

## 2012-04-12 MED ORDER — LIDOCAINE HCL (CARDIAC) 10 MG/ML IV SOLN
INTRAVENOUS | Status: DC | PRN
  Administered 2012-04-12: 100 mg via INTRAVENOUS

## 2012-04-12 MED ORDER — PROPOFOL 10 MG/ML IV EMUL
INTRAVENOUS | Status: DC | PRN
  Administered 2012-04-12: 200 mg via INTRAVENOUS
  Administered 2012-04-12: 30 mg via INTRAVENOUS

## 2012-04-12 MED ORDER — OXYCODONE HCL 5 MG/5ML PO SOLN
5.0000 mg | Freq: Once | ORAL | Status: AC | PRN
  Administered 2012-04-12: 5 mg via ORAL

## 2012-04-12 MED ORDER — DEXAMETHASONE SODIUM PHOSPHATE 4 MG/ML IJ SOLN
INTRAMUSCULAR | Status: DC | PRN
  Administered 2012-04-12: 10 mg via INTRAVENOUS

## 2012-04-12 MED ORDER — FENTANYL CITRATE 0.05 MG/ML IJ SOLN
INTRAMUSCULAR | Status: DC | PRN
  Administered 2012-04-12: 25 ug via INTRAVENOUS
  Administered 2012-04-12: 100 ug via INTRAVENOUS

## 2012-04-12 MED ORDER — AZITHROMYCIN 200 MG/5ML PO SUSR: 500.0000 mg | Freq: Every day | ORAL | Status: AC

## 2012-04-12 MED ORDER — METOCLOPRAMIDE HCL 5 MG/ML IJ SOLN: 10.0000 mg | Freq: Once | INTRAMUSCULAR | Status: DC | PRN

## 2012-04-12 MED ORDER — SODIUM CHLORIDE 0.9 % IR SOLN
Status: DC | PRN
  Administered 2012-04-12: 500 mL

## 2012-04-12 MED ORDER — ACETAMINOPHEN 10 MG/ML IV SOLN
1000.0000 mg | Freq: Once | INTRAVENOUS | Status: AC
Start: 2012-04-12 — End: 2012-04-12
  Administered 2012-04-12: 1000 mg via INTRAVENOUS

## 2012-04-12 MED ORDER — MIDAZOLAM HCL 5 MG/5ML IJ SOLN
INTRAMUSCULAR | Status: DC | PRN
  Administered 2012-04-12: 2 mg via INTRAVENOUS

## 2012-04-12 MED ORDER — OXYMETAZOLINE HCL 0.05 % NA SOLN
NASAL | Status: DC | PRN
  Administered 2012-04-12: 1

## 2012-04-12 MED ORDER — METOCLOPRAMIDE HCL 5 MG/ML IJ SOLN
INTRAMUSCULAR | Status: DC | PRN
  Administered 2012-04-12: 10 mg via INTRAVENOUS

## 2012-04-12 MED ORDER — LACTATED RINGERS IV SOLN
INTRAVENOUS | Status: DC
  Administered 2012-04-12 (×3): via INTRAVENOUS

## 2012-04-12 MED ORDER — OXYCODONE-ACETAMINOPHEN 5-325 MG/5ML PO SOLN: 5.0000 mL | ORAL | Status: DC | PRN

## 2012-04-12 MED ORDER — OXYCODONE HCL 5 MG PO TABS: 5.0000 mg | ORAL_TABLET | Freq: Once | ORAL | Status: DC | PRN

## 2012-04-12 MED ORDER — BACITRACIN ZINC 500 UNIT/GM EX OINT
TOPICAL_OINTMENT | CUTANEOUS | Status: DC | PRN
  Administered 2012-04-12: 1 via TOPICAL

## 2012-04-12 MED ORDER — ONDANSETRON HCL 4 MG/2ML IJ SOLN
INTRAMUSCULAR | Status: DC | PRN
  Administered 2012-04-12: 4 mg via INTRAVENOUS

## 2012-04-12 MED ORDER — HYDROMORPHONE HCL PF 1 MG/ML IJ SOLN
0.2500 mg | INTRAMUSCULAR | Status: DC | PRN
  Administered 2012-04-12 (×2): 0.5 mg via INTRAVENOUS

## 2012-04-12 SURGICAL SUPPLY — 30 items
BANDAGE COBAN STERILE 2 (GAUZE/BANDAGES/DRESSINGS) IMPLANT
CANISTER SUCTION 1200CC (MISCELLANEOUS) ×2 IMPLANT
CATH ROBINSON RED A/P 10FR (CATHETERS) IMPLANT
CATH ROBINSON RED A/P 14FR (CATHETERS) ×2 IMPLANT
CLOTH BEACON ORANGE TIMEOUT ST (SAFETY) ×2 IMPLANT
COAGULATOR SUCT SWTCH 10FR 6 (ELECTROSURGICAL) IMPLANT
COVER MAYO STAND STRL (DRAPES) ×2 IMPLANT
ELECT REM PT RETURN 9FT ADLT (ELECTROSURGICAL) ×2
ELECT REM PT RETURN 9FT PED (ELECTROSURGICAL)
ELECTRODE REM PT RETRN 9FT PED (ELECTROSURGICAL) IMPLANT
ELECTRODE REM PT RTRN 9FT ADLT (ELECTROSURGICAL) ×1 IMPLANT
GAUZE SPONGE 4X4 12PLY STRL LF (GAUZE/BANDAGES/DRESSINGS) ×2 IMPLANT
GLOVE BIO SURGEON STRL SZ7 (GLOVE) ×2 IMPLANT
GLOVE BIO SURGEON STRL SZ7.5 (GLOVE) ×2 IMPLANT
GOWN PREVENTION PLUS XLARGE (GOWN DISPOSABLE) ×4 IMPLANT
IV NS 500ML (IV SOLUTION) ×1
IV NS 500ML BAXH (IV SOLUTION) ×1 IMPLANT
MARKER SKIN DUAL TIP RULER LAB (MISCELLANEOUS) IMPLANT
NS IRRIG 1000ML POUR BTL (IV SOLUTION) ×2 IMPLANT
SHEET MEDIUM DRAPE 40X70 STRL (DRAPES) ×2 IMPLANT
SOLUTION BUTLER CLEAR DIP (MISCELLANEOUS) ×2 IMPLANT
SPONGE TONSIL 1 RF SGL (DISPOSABLE) IMPLANT
SPONGE TONSIL 1.25 RF SGL STRG (GAUZE/BANDAGES/DRESSINGS) ×2 IMPLANT
SYR BULB 3OZ (MISCELLANEOUS) IMPLANT
TOWEL OR 17X24 6PK STRL BLUE (TOWEL DISPOSABLE) ×2 IMPLANT
TUBE CONNECTING 20X1/4 (TUBING) ×2 IMPLANT
TUBE SALEM SUMP 12R W/ARV (TUBING) IMPLANT
TUBE SALEM SUMP 16 FR W/ARV (TUBING) ×2 IMPLANT
WAND COBLATOR 70 EVAC XTRA (SURGICAL WAND) ×2 IMPLANT
WATER STERILE IRR 1000ML POUR (IV SOLUTION) IMPLANT

## 2012-04-12 NOTE — Anesthesia Postprocedure Evaluation (Signed)
Anesthesia Post Note  Patient: Shelby Hatfield  Procedure(s) Performed: Procedure(s) (LRB): TONSILLECTOMY AND ADENOIDECTOMY (Bilateral)  Anesthesia type: General  Patient location: PACU  Post pain: Pain level controlled  Post assessment: Patient's Cardiovascular Status Stable  Last Vitals:  Filed Vitals:   04/12/12 1115  BP: 120/64  Pulse: 79  Temp:   Resp: 16    Post vital signs: Reviewed and stable  Level of consciousness: alert  Complications: No apparent anesthesia complications

## 2012-04-12 NOTE — Op Note (Signed)
DATE OF PROCEDURE:  04/12/2012                              OPERATIVE REPORT  SURGEON:  Shelby Pies, MD  PREOPERATIVE DIAGNOSES: 1. Adenotonsillar hypertrophy. 2. Chronic tonsillitis and pharyngitis  POSTOPERATIVE DIAGNOSES: 1. Adenotonsillar hypertrophy. 2. Chronic tonsillitis and pharyngitis  PROCEDURE PERFORMED:  Adenotonsillectomy.  ANESTHESIA:  General endotracheal tube anesthesia.  COMPLICATIONS:  None.  ESTIMATED BLOOD LOSS:  Minimal.  INDICATION FOR PROCEDURE:  Shelby Hatfield is a 36 y.o. female with a history of chronic tonsillitis/pharyngitis and halitosis.  According to the patient, she has been experiencing chronic throat discomfort/recurrent tonsillitis with halitosis for several years. The patient continues to be symptomatic despite medical treatments. On examination, the patient was noted to have bilateral cryptic tonsils, with numerous tonsilloliths. Based on the above findings, the decision was made for the patient to undergo the adenotonsillectomy procedure. Likelihood of success in reducing symptoms was also discussed.  The risks, benefits, alternatives, and details of the procedure were discussed with the mother.  Questions were invited and answered.  Informed consent was obtained.  DESCRIPTION:  The patient was taken to the operating room and placed supine on the operating table.  General endotracheal tube anesthesia was administered by the anesthesiologist.  The patient was positioned and prepped and draped in a standard fashion for adenotonsillectomy.  A Crowe-Davis mouth gag was inserted into the oral cavity for exposure. 4+ cryptic tonsils were noted bilaterally.  No bifidity was noted.  Indirect mirror examination of the nasopharynx revealed mild adenoid hypertrophy. The adenoid was ablated with the Coblator device. Hemostasis was achieved with the Coblator device.  The right tonsil was then grasped with a straight Allis clamp and retracted medially.  It was resected free  from the underlying pharyngeal constrictor muscles with the Coblator device.  The same procedure was repeated on the left side without exception.  The surgical sites were copiously irrigated.  The mouth gag was removed.  The care of the patient was turned over to the anesthesiologist.  The patient was awakened from anesthesia without difficulty.  The patient was extubated and transferred to the recovery room in good condition.  OPERATIVE FINDINGS:  Adenotonsillar hypertrophy.  SPECIMEN:  Bilateral tonsils  FOLLOWUP CARE:  The patient will be discharged home once awake and alert.  She will be placed on azithromycin susp 500mg  po QD for 3 days, and Roxicet 5-40ml po q 4 hours for postop pain control.   The patient will follow up in my office in approximately 2 weeks.  Shelby Hatfield,SUI W 04/12/2012 10:06 AM

## 2012-04-12 NOTE — Transfer of Care (Signed)
Immediate Anesthesia Transfer of Care Note  Patient: Shelby Hatfield  Procedure(s) Performed: Procedure(s) (LRB): TONSILLECTOMY AND ADENOIDECTOMY (Bilateral)  Patient Location: PACU  Anesthesia Type: General  Level of Consciousness: awake and patient cooperative  Airway & Oxygen Therapy: Patient Spontanous Breathing and aerosol face mask  Post-op Assessment: Report given to PACU RN and Post -op Vital signs reviewed and stable  Post vital signs: Reviewed and stable  Complications: No apparent anesthesia complications

## 2012-04-12 NOTE — Anesthesia Preprocedure Evaluation (Signed)
Anesthesia Evaluation  Patient identified by MRN, date of birth, ID band Patient awake    Reviewed: Allergy & Precautions, H&P , NPO status , Patient's Chart, lab work & pertinent test results, reviewed documented beta blocker date and time   Airway Mallampati: II TM Distance: >3 FB Neck ROM: full    Dental   Pulmonary neg pulmonary ROS,          Cardiovascular negative cardio ROS      Neuro/Psych  Headaches, PSYCHIATRIC DISORDERS Bipolar Disorder    GI/Hepatic negative GI ROS, Neg liver ROS,   Endo/Other  negative endocrine ROS  Renal/GU negative Renal ROS  negative genitourinary   Musculoskeletal   Abdominal   Peds  Hematology negative hematology ROS (+)   Anesthesia Other Findings See surgeon's H&P   Reproductive/Obstetrics negative OB ROS                           Anesthesia Physical Anesthesia Plan  ASA: II  Anesthesia Plan: General   Post-op Pain Management:    Induction: Intravenous  Airway Management Planned: Oral ETT  Additional Equipment:   Intra-op Plan:   Post-operative Plan: Extubation in OR  Informed Consent: I have reviewed the patients History and Physical, chart, labs and discussed the procedure including the risks, benefits and alternatives for the proposed anesthesia with the patient or authorized representative who has indicated his/her understanding and acceptance.   Dental Advisory Given  Plan Discussed with: CRNA and Surgeon  Anesthesia Plan Comments:         Anesthesia Quick Evaluation

## 2012-04-12 NOTE — Anesthesia Postprocedure Evaluation (Signed)
  Anesthesia Post-op Note  Patient: Shelby Hatfield  Procedure(s) Performed: Procedure(s) (LRB): TONSILLECTOMY AND ADENOIDECTOMY (Bilateral)  Patient Location: PACU  Anesthesia Type: General  Level of Consciousness: awake and alert   Airway and Oxygen Therapy: Patient Spontanous Breathing  Post-op Pain: mild  Post-op Assessment: Post-op Vital signs reviewed, Patient's Cardiovascular Status Stable, Respiratory Function Stable, Patent Airway and No signs of Nausea or vomiting  Post-op Vital Signs: Reviewed and stable  Complications: No apparent anesthesia complications

## 2012-04-12 NOTE — Anesthesia Procedure Notes (Signed)
Procedure Name: Intubation Date/Time: 04/12/2012 9:41 AM Performed by: Gar Gibbon Pre-anesthesia Checklist: Patient identified, Emergency Drugs available, Suction available and Patient being monitored Patient Re-evaluated:Patient Re-evaluated prior to inductionOxygen Delivery Method: Circle System Utilized Preoxygenation: Pre-oxygenation with 100% oxygen Intubation Type: IV induction Ventilation: Mask ventilation without difficulty Laryngoscope Size: Mac and 3 Grade View: Grade II Tube type: Oral Tube size: 7.0 mm Number of attempts: 1 Airway Equipment and Method: stylet and oral airway Placement Confirmation: ETT inserted through vocal cords under direct vision,  positive ETCO2 and breath sounds checked- equal and bilateral Tube secured with: Tape Dental Injury: Teeth and Oropharynx as per pre-operative assessment

## 2012-04-12 NOTE — H&P (Signed)
  H&P Update  Pt's original H&P dated 03/25/12 reviewed and placed in chart (to be scanned).  I personally examined the patient today.  No change in health. Proceed with adenotonsillectomy.

## 2012-04-12 NOTE — Brief Op Note (Signed)
04/12/2012  10:05 AM  PATIENT:  Shelby Hatfield  36 y.o. female  PRE-OPERATIVE DIAGNOSIS:  adenotonsillar hypertrophy, chronic tonsillitis/pharyngitis  POST-OPERATIVE DIAGNOSIS:  adenotonsillar hypertrophy, chronic tonsillitis/pharyngitis  PROCEDURE:  Procedure(s) (LRB): TONSILLECTOMY AND ADENOIDECTOMY (Bilateral)  SURGEON:  Surgeon(s) and Role:    * Darletta Moll, MD - Primary  PHYSICIAN ASSISTANT:   ASSISTANTS: none   ANESTHESIA:   general  EBL:  Total I/O In: 1000 [I.V.:1000] Out: -   BLOOD ADMINISTERED:none  DRAINS: none   LOCAL MEDICATIONS USED:  NONE  SPECIMEN:  No Specimen and Source of Specimen:  Bilateral tonsils  DISPOSITION OF SPECIMEN:  PATHOLOGY  COUNTS:  YES  TOURNIQUET:  * No tourniquets in log *  DICTATION: .Note written in EPIC  PLAN OF CARE: Discharge to home after PACU  PATIENT DISPOSITION:  PACU - hemodynamically stable.   Delay start of Pharmacological VTE agent (>24hrs) due to surgical blood loss or risk of bleeding: not applicable

## 2012-04-13 ENCOUNTER — Encounter (HOSPITAL_BASED_OUTPATIENT_CLINIC_OR_DEPARTMENT_OTHER): Payer: Self-pay | Admitting: Otolaryngology

## 2012-04-22 ENCOUNTER — Emergency Department (HOSPITAL_COMMUNITY)
Admission: EM | Admit: 2012-04-22 | Discharge: 2012-04-22 | Disposition: A | Discharge status: Home / Self Care | Payer: Medicaid Other | Attending: Emergency Medicine | Admitting: Emergency Medicine

## 2012-04-22 ENCOUNTER — Encounter (HOSPITAL_COMMUNITY): Payer: Self-pay | Admitting: Emergency Medicine

## 2012-04-22 ENCOUNTER — Emergency Department (HOSPITAL_COMMUNITY): Payer: Medicaid Other

## 2012-04-22 DIAGNOSIS — S46919A Strain of unspecified muscle, fascia and tendon at shoulder and upper arm level, unspecified arm, initial encounter: Secondary | ICD-10-CM

## 2012-04-22 DIAGNOSIS — Z87891 Personal history of nicotine dependence: Secondary | ICD-10-CM | POA: Insufficient documentation

## 2012-04-22 DIAGNOSIS — Z9089 Acquired absence of other organs: Secondary | ICD-10-CM | POA: Insufficient documentation

## 2012-04-22 DIAGNOSIS — M25519 Pain in unspecified shoulder: Secondary | ICD-10-CM | POA: Insufficient documentation

## 2012-04-22 DIAGNOSIS — F319 Bipolar disorder, unspecified: Secondary | ICD-10-CM | POA: Insufficient documentation

## 2012-04-22 MED ORDER — HYDROCODONE-ACETAMINOPHEN 5-325 MG PO TABS: 1.0000 | ORAL_TABLET | Freq: Four times a day (QID) | ORAL | Status: AC | PRN

## 2012-04-22 MED ORDER — METHOCARBAMOL 500 MG PO TABS: 500.0000 mg | ORAL_TABLET | Freq: Two times a day (BID) | ORAL | Status: AC

## 2012-04-22 MED ORDER — IBUPROFEN 800 MG PO TABS: 800.0000 mg | ORAL_TABLET | Freq: Three times a day (TID) | ORAL | Status: AC
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
## 2012-04-22 NOTE — ED Provider Notes (Signed)
History     CSN: [id number]  Arrival date & time 04/22/12  2116   First MD Initiated Contact with Patient 04/22/12 2135      10:54 PM HPI Patient reports a history of chronic intermittent shoulder pain is a shoulder is normal. For sore numbness, tingling, weakness. Reports no  Patient is a 36 y.o. female presenting with shoulder pain.  Shoulder Pain This is a new problem. The current episode started in the past 7 days. The problem occurs constantly. The problem has been unchanged. Pertinent negatives include no chest pain, chills, coughing, headaches, joint swelling, myalgias, nausea, neck pain, numbness, vomiting or weakness. She has tried rest for the symptoms. The treatment provided no relief.    Past Medical History  Diagnosis Date  . Bipolar 1 disorder   . Migraines   . Wears dentures     upper denture  . Adenotonsillar hypertrophy 03/2012    snores during sleep; denies apnea; states occ. wakes up coughing    Past Surgical History  Procedure Date  . Cesarean section 08/31/2001    total of  3  . Appendectomy   . Cholecystectomy   . Tubal ligation 08/31/2001  . Dilation and evacuation 04/09/2000  . Tonsillectomy and adenoidectomy 04/12/2012    Procedure: TONSILLECTOMY AND ADENOIDECTOMY;  Surgeon: Darletta Moll, MD;  Location: [facility];  Service: ENT;  Laterality: Bilateral;    No family history on file.  History  Substance Use Topics  . Smoking status: Former Games developer  . Smokeless tobacco: Never Used   Comment: quit smoking 11/2011  . Alcohol Use: No    OB History    Grav Para Term Preterm Abortions TAB SAB Ect Mult Living                  Review of Systems  Constitutional: Negative for chills and unexpected weight change.  HENT: Negative for neck pain.   Respiratory: Negative for cough and shortness of breath.   Cardiovascular: Negative for chest pain.  Gastrointestinal: Negative for nausea and vomiting.  Musculoskeletal: Negative for myalgias,  back pain and joint swelling.       Shoulder pain  Neurological: Negative for weakness, numbness and headaches.  All other systems reviewed and are negative.    Allergies  Penicillins  Home Medications   Current Outpatient Rx  Name Route Sig Dispense Refill  . B COMPLEX-C PO TABS Oral Take 1 tablet by mouth daily.    Marland Kitchen LAMOTRIGINE 200 MG PO TABS Oral Take 200 mg by mouth every morning.     . ACIDOPHILUS PROBIOTIC BLEND PO CAPS Oral Take 2 capsules by mouth 2 (two) times daily.    Marland Kitchen RIZATRIPTAN BENZOATE 10 MG PO TBDP Oral Take 10 mg by mouth as needed. May repeat in 2 hours if needed      BP 135/76  Pulse 74  Temp 98.7 F (37.1 C) (Oral)  Resp 12  SpO2 97%  LMP 04/02/2012  Physical Exam  Vitals reviewed. Constitutional: She is oriented to person, place, and time. Vital signs are normal. She appears well-developed and well-nourished. No distress.  HENT:  Head: Normocephalic and atraumatic.  Eyes: Pupils are equal, round, and reactive to light.  Neck: Neck supple.  Pulmonary/Chest: Effort normal.  Musculoskeletal:       Right shoulder: She exhibits decreased range of motion (decreased active range of motion and full passive range of motion.), tenderness (Patient's pain seems to be located over right upper trapezius muscle.  Suspect muscle strain/contusion), pain and spasm. She exhibits no swelling, no effusion, normal pulse and normal strength.       Arms: Neurological: She is alert and oriented to person, place, and time.  Skin: Skin is warm and dry. No rash noted. No erythema. No pallor.  Psychiatric: She has a normal mood and affect. Her behavior is normal.    ED Course  Procedures   Dg Shoulder Right  04/22/2012  *RADIOLOGY REPORT*  Clinical Data: Right shoulder pain  RIGHT SHOULDER - 2+ VIEW  Comparison: None.  Findings: No acute fracture and no dislocation.  Lateral downsloping of the acromion is noted.  IMPRESSION: No acute bony pathology.  Original Report  Authenticated By: Donavan Burnet, M.D.     MDM   Advised followup with orthopedic physician. Will prescribe medication. Advised him to range of motion exercises. Patient voices understanding and is ready for discharge     Thomasene Lot, Cordelia Poche 04/23/12 0149

## 2012-04-24 NOTE — ED Provider Notes (Signed)
Medical screening examination/treatment/procedure(s) were performed by non-physician practitioner and as supervising physician I was immediately available for consultation/collaboration.    Ikenna Ohms L Hanna Ra, MD 04/24/12 2207 

## 2012-06-04 ENCOUNTER — Emergency Department (HOSPITAL_COMMUNITY)
Admission: EM | Admit: 2012-06-04 | Discharge: 2012-06-04 | Disposition: A | Discharge status: Home / Self Care | Payer: Medicaid Other | Source: Home / Self Care

## 2012-06-04 ENCOUNTER — Encounter (HOSPITAL_COMMUNITY): Payer: Self-pay | Admitting: Emergency Medicine

## 2012-06-04 DIAGNOSIS — M25559 Pain in unspecified hip: Secondary | ICD-10-CM

## 2012-06-04 MED ORDER — TRAMADOL HCL 50 MG PO TABS: 50.0000 mg | ORAL_TABLET | Freq: Four times a day (QID) | ORAL | Status: AC | PRN

## 2012-06-04 MED ORDER — KETOROLAC TROMETHAMINE 60 MG/2ML IM SOLN
INTRAMUSCULAR | Status: AC
  Filled 2012-06-04: qty 2

## 2012-06-04 MED ORDER — METHYLPREDNISOLONE 4 MG PO KIT: PACK | ORAL | Status: AC

## 2012-06-04 MED ORDER — KETOROLAC TROMETHAMINE 60 MG/2ML IM SOLN
60.0000 mg | Freq: Once | INTRAMUSCULAR | Status: AC
  Administered 2012-06-04: 60 mg via INTRAMUSCULAR

## 2012-06-04 NOTE — ED Provider Notes (Signed)
History     CSN: [id number]  Arrival date & time 06/04/12  1101   First MD Initiated Contact with Patient 06/04/12 1137      Chief Complaint  Patient presents with  . Hip Pain    (Consider location/radiation/quality/duration/timing/severity/associated sxs/prior treatment) HPI Comments: Stands at job for 8h/d.   Patient is a 36 y.o. female presenting with hip pain.  Hip Pain This is a new problem. The current episode started more than 1 week ago. The problem occurs daily. The problem has been gradually worsening. Pertinent negatives include no chest pain, no abdominal pain, no headaches and no shortness of breath. The symptoms are aggravated by walking and standing. The symptoms are relieved by rest. She has tried nothing for the symptoms.    Past Medical History  Diagnosis Date  . Bipolar 1 disorder   . Migraines   . Wears dentures     upper denture  . Adenotonsillar hypertrophy 03/2012    snores during sleep; denies apnea; states occ. wakes up coughing    Past Surgical History  Procedure Date  . Cesarean section 08/31/2001    total of  3  . Appendectomy   . Cholecystectomy   . Tubal ligation 08/31/2001  . Dilation and evacuation 04/09/2000  . Tonsillectomy and adenoidectomy 04/12/2012    Procedure: TONSILLECTOMY AND ADENOIDECTOMY;  Surgeon: Darletta Moll, MD;  Location: [facility];  Service: ENT;  Laterality: Bilateral;    History reviewed. No pertinent family history.  History  Substance Use Topics  . Smoking status: Current Everyday Smoker -- 0.5 packs/day  . Smokeless tobacco: Never Used   Comment: quit smoking 11/2011  . Alcohol Use: No    OB History    Grav Para Term Preterm Abortions TAB SAB Ect Mult Living                  Review of Systems  Respiratory: Negative for shortness of breath.   Cardiovascular: Negative for chest pain.  Gastrointestinal: Negative for abdominal pain.  Musculoskeletal: Positive for myalgias and gait problem.    Skin: Negative.   Neurological: Negative for weakness and headaches.    Allergies  Penicillins  Home Medications   Current Outpatient Rx  Name Route Sig Dispense Refill  . LAMOTRIGINE 200 MG PO TABS Oral Take 200 mg by mouth every morning.     . B COMPLEX-C PO TABS Oral Take 1 tablet by mouth daily.    . METHYLPREDNISOLONE 4 MG PO KIT  As directed 21 tablet 0  . ACIDOPHILUS PROBIOTIC BLEND PO CAPS Oral Take 2 capsules by mouth 2 (two) times daily.    Marland Kitchen RIZATRIPTAN BENZOATE 10 MG PO TBDP Oral Take 10 mg by mouth as needed. May repeat in 2 hours if needed    . TRAMADOL HCL 50 MG PO TABS Oral Take 1 tablet (50 mg total) by mouth every 6 (six) hours as needed for pain. 15 tablet 0    BP 126/80  Pulse 78  Temp 98.3 F (36.8 C) (Oral)  Resp 14  SpO2 100%  LMP 05/25/2012  Physical Exam  Constitutional: She is oriented to person, place, and time. She appears well-developed and well-nourished. No distress.  Neck: Normal range of motion. Neck supple.  Musculoskeletal:       Tenderness R lower paralumbosacral musculature and the lateral hip musculature. No bony tenderness.   Neurological: She is alert and oriented to person, place, and time.  Skin: Skin is warm and dry.  No erythema.    ED Course  Procedures (including critical care time)  Labs Reviewed - No data to display No results found.   1. Hip pain       MDM  Medrol dose pack Tramadol 50mg  q 6h prn Local heat and perform stretches as i demonstrated. OOW for 3 more days.  Toradol 60mg  Im now.         Hayden Rasmussen, NP 06/04/12 2135

## 2012-06-04 NOTE — ED Notes (Signed)
Pt c/o pain on her right hip x7 days... Pain will radiate downward behind her right knee... Describes it as a burning and painful... Has been walking slower than usual x3 days... 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

## 2012-06-06 NOTE — ED Provider Notes (Signed)
Medical screening examination/treatment/procedure(s) were performed by non-physician practitioner and as supervising physician I was immediately available for consultation/collaboration.  Luiz Blare MD   Luiz Blare, MD 06/06/12 [phone]

## 2012-07-05 ENCOUNTER — Emergency Department: Payer: Self-pay | Admitting: Emergency Medicine

## 2012-08-02 ENCOUNTER — Encounter (HOSPITAL_COMMUNITY): Payer: Self-pay | Admitting: *Deleted

## 2012-08-02 ENCOUNTER — Emergency Department (HOSPITAL_COMMUNITY)
Admission: EM | Admit: 2012-08-02 | Discharge: 2012-08-02 | Discharge status: Against Medical Advice | Payer: Medicaid Other | Attending: Emergency Medicine | Admitting: Emergency Medicine

## 2012-08-02 DIAGNOSIS — R110 Nausea: Secondary | ICD-10-CM | POA: Insufficient documentation

## 2012-08-02 DIAGNOSIS — R51 Headache: Secondary | ICD-10-CM | POA: Insufficient documentation

## 2012-08-02 NOTE — ED Notes (Signed)
Pt is here with headache and nausea for 2 days.  Pt has history of migraines.

## 2012-08-02 NOTE — ED Notes (Signed)
Called Pt 5 times NO ANSWER

## 2012-08-22 ENCOUNTER — Emergency Department (HOSPITAL_COMMUNITY)
Admission: EM | Admit: 2012-08-22 | Discharge: 2012-08-22 | Disposition: A | Discharge status: Home / Self Care | Payer: Medicaid Other | Attending: Emergency Medicine | Admitting: Emergency Medicine

## 2012-08-22 ENCOUNTER — Encounter (HOSPITAL_COMMUNITY): Payer: Self-pay | Admitting: *Deleted

## 2012-08-22 DIAGNOSIS — X58XXXA Exposure to other specified factors, initial encounter: Secondary | ICD-10-CM | POA: Insufficient documentation

## 2012-08-22 DIAGNOSIS — Z79899 Other long term (current) drug therapy: Secondary | ICD-10-CM | POA: Insufficient documentation

## 2012-08-22 DIAGNOSIS — Z8719 Personal history of other diseases of the digestive system: Secondary | ICD-10-CM | POA: Insufficient documentation

## 2012-08-22 DIAGNOSIS — Y929 Unspecified place or not applicable: Secondary | ICD-10-CM | POA: Insufficient documentation

## 2012-08-22 DIAGNOSIS — F17200 Nicotine dependence, unspecified, uncomplicated: Secondary | ICD-10-CM | POA: Insufficient documentation

## 2012-08-22 DIAGNOSIS — Z8679 Personal history of other diseases of the circulatory system: Secondary | ICD-10-CM | POA: Insufficient documentation

## 2012-08-22 DIAGNOSIS — Z8742 Personal history of other diseases of the female genital tract: Secondary | ICD-10-CM | POA: Insufficient documentation

## 2012-08-22 DIAGNOSIS — Y939 Activity, unspecified: Secondary | ICD-10-CM | POA: Insufficient documentation

## 2012-08-22 DIAGNOSIS — F319 Bipolar disorder, unspecified: Secondary | ICD-10-CM | POA: Insufficient documentation

## 2012-08-22 DIAGNOSIS — S336XXA Sprain of sacroiliac joint, initial encounter: Secondary | ICD-10-CM | POA: Insufficient documentation

## 2012-08-22 DIAGNOSIS — S39012A Strain of muscle, fascia and tendon of lower back, initial encounter: Secondary | ICD-10-CM

## 2012-08-22 MED ORDER — HYDROCODONE-ACETAMINOPHEN 5-500 MG PO TABS: 1.0000 | ORAL_TABLET | Freq: Four times a day (QID) | ORAL | Status: DC | PRN

## 2012-08-22 MED ORDER — OXYCODONE-ACETAMINOPHEN 5-325 MG PO TABS
1.0000 | ORAL_TABLET | Freq: Once | ORAL | Status: AC
  Administered 2012-08-22: 1 via ORAL
  Filled 2012-08-22: qty 1

## 2012-08-22 MED ORDER — IBUPROFEN 800 MG PO TABS
800.0000 mg | ORAL_TABLET | Freq: Once | ORAL | Status: AC
  Administered 2012-08-22: 800 mg via ORAL
  Filled 2012-08-22: qty 1

## 2012-08-22 MED ORDER — METHOCARBAMOL 500 MG PO TABS: 500.0000 mg | ORAL_TABLET | Freq: Three times a day (TID) | ORAL | Status: DC

## 2012-08-22 MED ORDER — DIAZEPAM 5 MG PO TABS
5.0000 mg | ORAL_TABLET | Freq: Once | ORAL | Status: AC
  Administered 2012-08-22: 5 mg via ORAL
  Filled 2012-08-22: qty 1

## 2012-08-22 NOTE — ED Provider Notes (Signed)
History     CSN: [id number]  Arrival date & time 08/22/12  4098   First MD Initiated Contact with Patient 08/22/12 [phone]      No chief complaint on file.   (Consider location/radiation/quality/duration/timing/severity/associated sxs/prior treatment) HPI  Pt to the ER with complaints of right hip and low back pain.  She pushed a chest around on the floor 3 days ago and it was sore the next day. Then last night she couldn't sleep because of the pain and today it is very severe.  She is ambulatory but it is slow and uncomfortable.  She denies having any loss of bowel or urine control. She denies numbness tingling or weakness.  nad vss  Past Medical History  Diagnosis Date  . Bipolar 1 disorder   . Migraines   . Wears dentures     upper denture  . Adenotonsillar hypertrophy 03/2012    snores during sleep; denies apnea; states occ. wakes up coughing    Past Surgical History  Procedure Date  . Cesarean section 08/31/2001    total of  3  . Appendectomy   . Cholecystectomy   . Tubal ligation 08/31/2001  . Dilation and evacuation 04/09/2000  . Tonsillectomy and adenoidectomy 04/12/2012    Procedure: TONSILLECTOMY AND ADENOIDECTOMY;  Surgeon: Darletta Moll, MD;  Location: [facility];  Service: ENT;  Laterality: Bilateral;  . Tonsillectomy     History reviewed. No pertinent family history.  History  Substance Use Topics  . Smoking status: Current Every Day Smoker -- 0.5 packs/day  . Smokeless tobacco: Never Used     Comment: quit smoking 11/2011  . Alcohol Use: No    OB History    Grav Para Term Preterm Abortions TAB SAB Ect Mult Living                  Review of Systems  Review of Systems  Gen: no weight loss, fevers, chills, night sweats  Neck: no neck pain  Lungs:No wheezing, coughing or hemoptysis CV: no chest pain, palpitations, dependent edema or orthopnea  Abd: no abdominal pain, nausea, vomiting  GU: no dysuria or gross hematuria  MSK: + right  hip and low back pain Neuro: no headache, no focal neurologic deficits  Skin: no abnormalities Psyche: negative.   Allergies  Penicillins  Home Medications   Current Outpatient Rx  Name  Route  Sig  Dispense  Refill  . B COMPLEX-C PO TABS   Oral   Take 1 tablet by mouth daily.         Marland Kitchen VITAMIN D 1000 UNITS PO TABS   Oral   Take 5,000 Units by mouth daily.         . IBUPROFEN 200 MG PO TABS   Oral   Take 800 mg by mouth every 6 (six) hours as needed. Pain         . POLYSACCHARIDE IRON COMPLEX 150 MG PO CAPS   Oral   Take 150 mg by mouth 2 (two) times daily.         Marland Kitchen LAMOTRIGINE 200 MG PO TABS   Oral   Take 200 mg by mouth every morning.          . ACIDOPHILUS PROBIOTIC BLEND PO CAPS   Oral   Take 2 capsules by mouth 2 (two) times daily.         Marland Kitchen RIZATRIPTAN BENZOATE 10 MG PO TBDP   Oral   Take 10 mg by  mouth as needed. For migraines May repeat in 2 hours if needed         . HYDROCODONE-ACETAMINOPHEN 5-500 MG PO TABS   Oral   Take 1-2 tablets by mouth every 6 (six) hours as needed for pain.   12 tablet   0   . METHOCARBAMOL 500 MG PO TABS   Oral   Take 1 tablet (500 mg total) by mouth 3 (three) times daily.   20 tablet   0     BP 124/76  Pulse 91  Temp 98.1 F (36.7 C) (Oral)  Resp 18  SpO2 98%  LMP 08/15/2012  Physical Exam  Nursing note and vitals reviewed. Constitutional: She appears well-developed and well-nourished. No distress.  HENT:  Head: Normocephalic and atraumatic.  Eyes: Pupils are equal, round, and reactive to light.  Neck: Normal range of motion. Neck supple.  Cardiovascular: Normal rate and regular rhythm.   Pulmonary/Chest: Effort normal.  Musculoskeletal:       Back:        Equal strength to bilateral lower extremities. Neurosensory  function adequate to both legs. Skin color is normal. Skin is warm and moist. I see no step off deformity, no bony tenderness. Pt is able to ambulate without limp. Pain is  relieved when sitting in certain positions. ROM is decreased due to pain. No crepitus, laceration, effusion, swelling.  Pulses are normal   Neurological: She is alert.  Skin: Skin is warm and dry.    ED Course  Procedures (including critical care time)  Labs Reviewed - No data to display No results found.   1. Low back strain       MDM  Patient with back pain. No neurological deficits. Patient is ambulatory. No warning symptoms of back pain including: loss of bowel or bladder control, night sweats, waking from sleep with back pain, unexplained fevers or weight loss, h/o cancer, IVDU, recent trauma. No concern for cauda equina, epidural abscess, or other serious cause of back pain. Conservative measures such as rest, ice/heat and pain medicine indicated with PCP follow-up if no improvement with conservative management.           Dorthula Matas, PA 08/22/12 1112

## 2012-08-22 NOTE — ED Notes (Signed)
Pt states she woke up this morning, rolled over and started having severe lower back pain radiating around R side down R leg, Pt states "I feel like my butt cheek is on fire". Pt denies urinary symptoms or hx of back problems.

## 2012-08-22 NOTE — ED Provider Notes (Signed)
Medical screening examination/treatment/procedure(s) were performed by non-physician practitioner and as supervising physician I was immediately available for consultation/collaboration.  Toy Baker, MD 08/22/12 [phone]

## 2012-09-05 ENCOUNTER — Other Ambulatory Visit: Payer: Self-pay | Admitting: Family Medicine

## 2012-09-05 ENCOUNTER — Ambulatory Visit
Admission: RE | Admit: 2012-09-05 | Discharge: 2012-09-05 | Disposition: A | Discharge status: Home / Self Care | Payer: Medicaid Other | Source: Ambulatory Visit | Attending: Family Medicine | Admitting: Family Medicine

## 2012-09-05 DIAGNOSIS — M545 Low back pain: Secondary | ICD-10-CM

## 2012-10-05 ENCOUNTER — Ambulatory Visit: Payer: Medicaid Other | Attending: Physical Medicine and Rehabilitation | Admitting: Physical Therapy

## 2012-12-12 ENCOUNTER — Emergency Department: Payer: Self-pay | Admitting: Emergency Medicine

## 2013-04-05 ENCOUNTER — Emergency Department (HOSPITAL_COMMUNITY)
Admission: EM | Admit: 2013-04-05 | Discharge: 2013-04-05 | Disposition: A | Discharge status: Home / Self Care | Payer: Medicaid Other | Attending: Emergency Medicine | Admitting: Emergency Medicine

## 2013-04-05 ENCOUNTER — Encounter (HOSPITAL_COMMUNITY): Payer: Self-pay | Admitting: Family Medicine

## 2013-04-05 ENCOUNTER — Emergency Department (HOSPITAL_COMMUNITY): Payer: Medicaid Other

## 2013-04-05 DIAGNOSIS — Y929 Unspecified place or not applicable: Secondary | ICD-10-CM | POA: Insufficient documentation

## 2013-04-05 DIAGNOSIS — F17200 Nicotine dependence, unspecified, uncomplicated: Secondary | ICD-10-CM | POA: Insufficient documentation

## 2013-04-05 DIAGNOSIS — Y9389 Activity, other specified: Secondary | ICD-10-CM | POA: Insufficient documentation

## 2013-04-05 DIAGNOSIS — Z79899 Other long term (current) drug therapy: Secondary | ICD-10-CM | POA: Insufficient documentation

## 2013-04-05 DIAGNOSIS — Z880 Allergy status to penicillin: Secondary | ICD-10-CM | POA: Insufficient documentation

## 2013-04-05 DIAGNOSIS — M549 Dorsalgia, unspecified: Secondary | ICD-10-CM

## 2013-04-05 DIAGNOSIS — X58XXXA Exposure to other specified factors, initial encounter: Secondary | ICD-10-CM | POA: Insufficient documentation

## 2013-04-05 DIAGNOSIS — F319 Bipolar disorder, unspecified: Secondary | ICD-10-CM | POA: Insufficient documentation

## 2013-04-05 DIAGNOSIS — IMO0002 Reserved for concepts with insufficient information to code with codable children: Secondary | ICD-10-CM | POA: Insufficient documentation

## 2013-04-05 DIAGNOSIS — Z8709 Personal history of other diseases of the respiratory system: Secondary | ICD-10-CM | POA: Insufficient documentation

## 2013-04-05 DIAGNOSIS — G43909 Migraine, unspecified, not intractable, without status migrainosus: Secondary | ICD-10-CM | POA: Insufficient documentation

## 2013-04-05 MED ORDER — OXYCODONE-ACETAMINOPHEN 5-325 MG PO TABS: 1.0000 | ORAL_TABLET | ORAL | Status: DC | PRN

## 2013-04-05 MED ORDER — CYCLOBENZAPRINE HCL 10 MG PO TABS: 10.0000 mg | ORAL_TABLET | Freq: Two times a day (BID) | ORAL | Status: DC | PRN

## 2013-04-05 MED ORDER — OXYCODONE-ACETAMINOPHEN 5-325 MG PO TABS
1.0000 | ORAL_TABLET | Freq: Once | ORAL | Status: AC
  Administered 2013-04-05: 1 via ORAL
  Filled 2013-04-05: qty 1

## 2013-04-05 MED ORDER — KETOROLAC TROMETHAMINE 60 MG/2ML IM SOLN
60.0000 mg | Freq: Once | INTRAMUSCULAR | Status: AC
  Administered 2013-04-05: 60 mg via INTRAMUSCULAR
  Filled 2013-04-05: qty 2

## 2013-04-05 MED ORDER — MELOXICAM 15 MG PO TABS: 15.0000 mg | ORAL_TABLET | Freq: Every day | ORAL | Status: DC
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
## 2013-04-06 NOTE — ED Provider Notes (Signed)
Medical screening examination/treatment/procedure(s) were performed by non-physician practitioner and as supervising physician I was immediately available for consultation/collaboration.   Carleene Cooper III, MD 04/06/13 [phone]

## 2013-04-10 ENCOUNTER — Emergency Department (HOSPITAL_COMMUNITY)
Admission: EM | Admit: 2013-04-10 | Discharge: 2013-04-10 | Disposition: A | Discharge status: Home / Self Care | Payer: Medicaid Other | Attending: Emergency Medicine | Admitting: Emergency Medicine

## 2013-04-10 DIAGNOSIS — G43909 Migraine, unspecified, not intractable, without status migrainosus: Secondary | ICD-10-CM | POA: Insufficient documentation

## 2013-04-10 DIAGNOSIS — Z880 Allergy status to penicillin: Secondary | ICD-10-CM | POA: Insufficient documentation

## 2013-04-10 DIAGNOSIS — F319 Bipolar disorder, unspecified: Secondary | ICD-10-CM | POA: Insufficient documentation

## 2013-04-10 DIAGNOSIS — IMO0002 Reserved for concepts with insufficient information to code with codable children: Secondary | ICD-10-CM | POA: Insufficient documentation

## 2013-04-10 DIAGNOSIS — M549 Dorsalgia, unspecified: Secondary | ICD-10-CM

## 2013-04-10 DIAGNOSIS — Z98811 Dental restoration status: Secondary | ICD-10-CM | POA: Insufficient documentation

## 2013-04-10 DIAGNOSIS — X500XXA Overexertion from strenuous movement or load, initial encounter: Secondary | ICD-10-CM | POA: Insufficient documentation

## 2013-04-10 DIAGNOSIS — R110 Nausea: Secondary | ICD-10-CM | POA: Insufficient documentation

## 2013-04-10 DIAGNOSIS — Y939 Activity, unspecified: Secondary | ICD-10-CM | POA: Insufficient documentation

## 2013-04-10 DIAGNOSIS — Z8709 Personal history of other diseases of the respiratory system: Secondary | ICD-10-CM | POA: Insufficient documentation

## 2013-04-10 DIAGNOSIS — Y929 Unspecified place or not applicable: Secondary | ICD-10-CM | POA: Insufficient documentation

## 2013-04-10 DIAGNOSIS — R350 Frequency of micturition: Secondary | ICD-10-CM | POA: Insufficient documentation

## 2013-04-10 DIAGNOSIS — Z79899 Other long term (current) drug therapy: Secondary | ICD-10-CM | POA: Insufficient documentation

## 2013-04-10 DIAGNOSIS — F17200 Nicotine dependence, unspecified, uncomplicated: Secondary | ICD-10-CM | POA: Insufficient documentation

## 2013-04-10 LAB — URINE MICROSCOPIC-ADD ON

## 2013-04-10 LAB — URINALYSIS, ROUTINE W REFLEX MICROSCOPIC
Glucose, UA: NEGATIVE mg/dL
Ketones, ur: NEGATIVE mg/dL
Leukocytes, UA: NEGATIVE
Specific Gravity, Urine: 1.034 — ABNORMAL HIGH (ref 1.005–1.030)
pH: 6 (ref 5.0–8.0)

## 2013-04-10 MED ORDER — HYDROMORPHONE HCL PF 2 MG/ML IJ SOLN
2.0000 mg | Freq: Once | INTRAMUSCULAR | Status: AC
  Administered 2013-04-10: 2 mg via INTRAMUSCULAR
  Filled 2013-04-10: qty 1

## 2013-04-10 MED ORDER — DIAZEPAM 5 MG PO TABS
10.0000 mg | ORAL_TABLET | Freq: Once | ORAL | Status: AC
  Administered 2013-04-10: 10 mg via ORAL
  Filled 2013-04-10: qty 2

## 2013-04-10 MED ORDER — NITROFURANTOIN MONOHYD MACRO 100 MG PO CAPS: 100.0000 mg | ORAL_CAPSULE | Freq: Two times a day (BID) | ORAL | Status: DC

## 2013-04-10 MED ORDER — OXYCODONE-ACETAMINOPHEN 5-325 MG PO TABS: 1.0000 | ORAL_TABLET | Freq: Four times a day (QID) | ORAL | Status: DC | PRN

## 2013-04-10 MED ORDER — OXYCODONE-ACETAMINOPHEN 5-325 MG PO TABS
1.0000 | ORAL_TABLET | Freq: Once | ORAL | Status: AC
  Administered 2013-04-10: 1 via ORAL
  Filled 2013-04-10: qty 1

## 2013-04-10 MED ORDER — METHOCARBAMOL 500 MG PO TABS: 1000.0000 mg | ORAL_TABLET | Freq: Four times a day (QID) | ORAL | Status: DC

## 2013-04-10 MED ORDER — ONDANSETRON 4 MG PO TBDP
8.0000 mg | ORAL_TABLET | Freq: Once | ORAL | Status: AC
  Administered 2013-04-10: 8 mg via ORAL
  Filled 2013-04-10: qty 2
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
## 2013-04-10 NOTE — ED Notes (Signed)
Her Doctor told her she would have to wait 30 days for Medicaid to pay for MRI
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
## 2013-04-11 NOTE — ED Provider Notes (Signed)
Medical screening examination/treatment/procedure(s) were performed by non-physician practitioner and as supervising physician I was immediately available for consultation/collaboration.    Christopher J. Pollina, MD 04/11/13 1614 

## 2013-04-12 LAB — URINE CULTURE

## 2013-04-13 NOTE — ED Notes (Signed)
+   Urine Patient treated with Nitrofurantoin-sensitive to same-chart appended per protocol MD.  

## 2013-04-19 ENCOUNTER — Inpatient Hospital Stay (HOSPITAL_COMMUNITY)
Admission: AD | Admit: 2013-04-19 | Discharge: 2013-04-25 | DRG: 885 | Disposition: A | Discharge status: Home / Self Care | Payer: Medicaid Other | Source: Intra-hospital | Attending: Psychiatry | Admitting: Psychiatry

## 2013-04-19 ENCOUNTER — Encounter (HOSPITAL_COMMUNITY): Payer: Self-pay | Admitting: *Deleted

## 2013-04-19 DIAGNOSIS — Z79899 Other long term (current) drug therapy: Secondary | ICD-10-CM

## 2013-04-19 DIAGNOSIS — J449 Chronic obstructive pulmonary disease, unspecified: Secondary | ICD-10-CM | POA: Diagnosis present

## 2013-04-19 DIAGNOSIS — F332 Major depressive disorder, recurrent severe without psychotic features: Secondary | ICD-10-CM

## 2013-04-19 DIAGNOSIS — F411 Generalized anxiety disorder: Secondary | ICD-10-CM | POA: Diagnosis present

## 2013-04-19 DIAGNOSIS — F1320 Sedative, hypnotic or anxiolytic dependence, uncomplicated: Secondary | ICD-10-CM | POA: Diagnosis present

## 2013-04-19 DIAGNOSIS — J4489 Other specified chronic obstructive pulmonary disease: Secondary | ICD-10-CM | POA: Diagnosis present

## 2013-04-19 DIAGNOSIS — F3160 Bipolar disorder, current episode mixed, unspecified: Principal | ICD-10-CM | POA: Diagnosis present

## 2013-04-19 DIAGNOSIS — F1120 Opioid dependence, uncomplicated: Secondary | ICD-10-CM | POA: Diagnosis present

## 2013-04-19 DIAGNOSIS — F4323 Adjustment disorder with mixed anxiety and depressed mood: Secondary | ICD-10-CM

## 2013-04-19 DIAGNOSIS — R45851 Suicidal ideations: Secondary | ICD-10-CM

## 2013-04-19 HISTORY — DX: Anxiety disorder, unspecified: F41.9

## 2013-04-19 HISTORY — DX: Depression, unspecified: F32.A

## 2013-04-19 HISTORY — DX: Anemia, unspecified: D64.9

## 2013-04-19 HISTORY — DX: Chronic obstructive pulmonary disease, unspecified: J44.9

## 2013-04-19 HISTORY — DX: Major depressive disorder, single episode, unspecified: F32.9

## 2013-04-19 LAB — COMPREHENSIVE METABOLIC PANEL
Albumin: 3.8 g/dL (ref 3.5–5.2)
Alkaline Phosphatase: 91 U/L (ref 39–117)
BUN: 9 mg/dL (ref 6–23)
Calcium: 9.7 mg/dL (ref 8.4–10.5)
Creatinine, Ser: 0.77 mg/dL (ref 0.50–1.10)
GFR calc Af Amer: >90 mL/min (ref >90)
Glucose, Bld: 116 mg/dL — ABNORMAL HIGH (ref 70–99)
Total Protein: 7.5 g/dL (ref 6.0–8.3)

## 2013-04-19 LAB — CBC
HCT: 39.7 % (ref 36.0–46.0)
Hemoglobin: 13 g/dL (ref 12.0–15.0)
MCH: 26.4 pg (ref 26.0–34.0)
MCHC: 32.7 g/dL (ref 30.0–36.0)
MCV: 80.7 fL (ref 78.0–100.0)
RDW: 14.7 % (ref 11.5–15.5)

## 2013-04-19 LAB — ETHANOL: Alcohol, Ethyl (B): <11 mg/dL (ref 0–11)

## 2013-04-19 MED ORDER — FERROUS SULFATE 325 (65 FE) MG PO TABS
325.0000 mg | ORAL_TABLET | Freq: Two times a day (BID) | ORAL | Status: DC
  Administered 2013-04-20 – 2013-04-25 (×11): 325 mg via ORAL
  Filled 2013-04-19 (×15): qty 1

## 2013-04-19 MED ORDER — PNEUMOCOCCAL VAC POLYVALENT 25 MCG/0.5ML IJ INJ
0.5000 mL | INJECTION | INTRAMUSCULAR | Status: AC
  Administered 2013-04-20: 0.5 mL via INTRAMUSCULAR

## 2013-04-19 MED ORDER — VITAMIN D (ERGOCALCIFEROL) 1.25 MG (50000 UNIT) PO CAPS: 50000.0000 [IU] | ORAL_CAPSULE | Freq: Every day | ORAL | Status: DC

## 2013-04-19 MED ORDER — LAMOTRIGINE 200 MG PO TABS
200.0000 mg | ORAL_TABLET | Freq: Every day | ORAL | Status: DC
  Administered 2013-04-19 – 2013-04-25 (×7): 200 mg via ORAL
  Filled 2013-04-19 (×2): qty 1
  Filled 2013-04-19: qty 2
  Filled 2013-04-19 (×7): qty 1

## 2013-04-19 MED ORDER — TRAZODONE HCL 50 MG PO TABS
50.0000 mg | ORAL_TABLET | Freq: Every evening | ORAL | Status: DC | PRN
  Administered 2013-04-19 – 2013-04-24 (×7): 50 mg via ORAL
  Filled 2013-04-19 (×6): qty 1

## 2013-04-19 MED ORDER — VITAMIN D3 25 MCG (1000 UNIT) PO TABS
5000.0000 [IU] | ORAL_TABLET | Freq: Every day | ORAL | Status: DC
  Administered 2013-04-20 – 2013-04-25 (×6): 5000 [IU] via ORAL
  Filled 2013-04-19 (×8): qty 5

## 2013-04-19 MED ORDER — MELOXICAM 15 MG PO TABS
15.0000 mg | ORAL_TABLET | Freq: Every day | ORAL | Status: DC
  Administered 2013-04-19 – 2013-04-25 (×7): 15 mg via ORAL
  Filled 2013-04-19 (×4): qty 1
  Filled 2013-04-19: qty 2
  Filled 2013-04-19 (×5): qty 1

## 2013-04-19 MED ORDER — ACETAMINOPHEN 325 MG PO TABS
650.0000 mg | ORAL_TABLET | Freq: Four times a day (QID) | ORAL | Status: DC | PRN
  Administered 2013-04-20 – 2013-04-25 (×13): 650 mg via ORAL

## 2013-04-19 MED ORDER — TIOTROPIUM BROMIDE MONOHYDRATE 18 MCG IN CAPS
18.0000 ug | ORAL_CAPSULE | Freq: Every day | RESPIRATORY_TRACT | Status: DC
  Administered 2013-04-19 – 2013-04-25 (×7): 18 ug via RESPIRATORY_TRACT
  Filled 2013-04-19: qty 5

## 2013-04-19 MED ORDER — ALBUTEROL SULFATE HFA 108 (90 BASE) MCG/ACT IN AERS: 1.0000 | INHALATION_SPRAY | Freq: Four times a day (QID) | RESPIRATORY_TRACT | Status: DC | PRN

## 2013-04-19 MED ORDER — GABAPENTIN 300 MG PO CAPS
300.0000 mg | ORAL_CAPSULE | Freq: Three times a day (TID) | ORAL | Status: DC
  Administered 2013-04-19 – 2013-04-25 (×18): 300 mg via ORAL
  Filled 2013-04-19 (×24): qty 1

## 2013-04-19 MED ORDER — MAGNESIUM HYDROXIDE 400 MG/5ML PO SUSP
30.0000 mL | Freq: Every day | ORAL | Status: DC | PRN
  Administered 2013-04-24: 30 mL via ORAL

## 2013-04-19 MED ORDER — ALUM & MAG HYDROXIDE-SIMETH 200-200-20 MG/5ML PO SUSP
30.0000 mL | ORAL | Status: DC | PRN
  Administered 2013-04-23 (×3): 30 mL via ORAL

## 2013-04-19 NOTE — Tx Team (Signed)
Initial Interdisciplinary Treatment Plan  PATIENT STRENGTHS: (choose at least two) Capable of independent living Financial means General fund of knowledge Supportive family/friends  PATIENT STRESSORS: Marital or family conflict   PROBLEM LIST: Problem List/Patient Goals Date to be addressed Date deferred Reason deferred Estimated date of resolution  Depression 04/19/2013     Suicidal thoguhts 04/19/2013                                                DISCHARGE CRITERIA:  Improved stabilization in mood, thinking, and/or behavior Need for constant or close observation no longer present Reduction of life-threatening or endangering symptoms to within safe limits  PRELIMINARY DISCHARGE PLAN: Return to previous living arrangement Return to previous work or school arrangements  PATIENT/FAMIILY INVOLVEMENT: This treatment plan has been presented to and reviewed with the patient, Shelby Hatfield.  The patient and family have been given the opportunity to ask questions and make suggestions.  Leighton Parody M 04/19/2013, 6:49 PM
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
## 2013-04-19 NOTE — Progress Notes (Signed)
Adult Psychoeducational Group Note  Date:  04/19/2013 Time:  10:38 PM  Group Topic/Focus:  Wrap-Up Group:   The focus of this group is to help patients review their daily goal of treatment and discuss progress on daily workbooks.  Participation Level:  Did Not Attend   Shelby Hatfield 04/19/2013, 10:39 PM

## 2013-04-20 DIAGNOSIS — F3160 Bipolar disorder, current episode mixed, unspecified: Secondary | ICD-10-CM | POA: Diagnosis present

## 2013-04-20 DIAGNOSIS — F1411 Cocaine abuse, in remission: Secondary | ICD-10-CM

## 2013-04-20 DIAGNOSIS — F319 Bipolar disorder, unspecified: Secondary | ICD-10-CM

## 2013-04-20 DIAGNOSIS — F1120 Opioid dependence, uncomplicated: Secondary | ICD-10-CM | POA: Diagnosis present

## 2013-04-20 DIAGNOSIS — F1211 Cannabis abuse, in remission: Secondary | ICD-10-CM

## 2013-04-20 DIAGNOSIS — F1320 Sedative, hypnotic or anxiolytic dependence, uncomplicated: Secondary | ICD-10-CM | POA: Diagnosis present

## 2013-04-20 LAB — URINALYSIS, ROUTINE W REFLEX MICROSCOPIC
Glucose, UA: NEGATIVE mg/dL
Hgb urine dipstick: NEGATIVE
Ketones, ur: NEGATIVE mg/dL
Leukocytes, UA: NEGATIVE
Protein, ur: NEGATIVE mg/dL
Urobilinogen, UA: 0.2 mg/dL (ref 0.0–1.0)

## 2013-04-20 LAB — COMPREHENSIVE METABOLIC PANEL
ALT: 11 U/L (ref 0–35)
Albumin: 3.3 g/dL — ABNORMAL LOW (ref 3.5–5.2)
Alkaline Phosphatase: 80 U/L (ref 39–117)
BUN: 13 mg/dL (ref 6–23)
Chloride: 104 mEq/L (ref 96–112)
Potassium: 4.2 mEq/L (ref 3.5–5.1)
Sodium: 138 mEq/L (ref 135–145)
Total Bilirubin: 0.2 mg/dL — ABNORMAL LOW (ref 0.3–1.2)
Total Protein: 6.5 g/dL (ref 6.0–8.3)

## 2013-04-20 LAB — RAPID URINE DRUG SCREEN, HOSP PERFORMED
Amphetamines: NOT DETECTED
Opiates: POSITIVE — AB
Tetrahydrocannabinol: NOT DETECTED

## 2013-04-20 LAB — TSH: TSH: 1.109 u[IU]/mL (ref 0.350–4.500)

## 2013-04-20 MED ORDER — LOPERAMIDE HCL 2 MG PO CAPS: 2.0000 mg | ORAL_CAPSULE | ORAL | Status: DC | PRN

## 2013-04-20 MED ORDER — CHLORDIAZEPOXIDE HCL 25 MG PO CAPS
25.0000 mg | ORAL_CAPSULE | Freq: Three times a day (TID) | ORAL | Status: AC
  Administered 2013-04-22 – 2013-04-23 (×3): 25 mg via ORAL
  Filled 2013-04-20 (×3): qty 1

## 2013-04-20 MED ORDER — CHLORDIAZEPOXIDE HCL 25 MG PO CAPS
25.0000 mg | ORAL_CAPSULE | Freq: Every day | ORAL | Status: AC
  Administered 2013-04-25: 25 mg via ORAL
  Filled 2013-04-20: qty 1

## 2013-04-20 MED ORDER — CHLORDIAZEPOXIDE HCL 25 MG PO CAPS
25.0000 mg | ORAL_CAPSULE | Freq: Four times a day (QID) | ORAL | Status: AC
  Administered 2013-04-21 – 2013-04-22 (×6): 25 mg via ORAL
  Filled 2013-04-20 (×6): qty 1

## 2013-04-20 MED ORDER — DICYCLOMINE HCL 20 MG PO TABS: 20.0000 mg | ORAL_TABLET | Freq: Four times a day (QID) | ORAL | Status: DC | PRN

## 2013-04-20 MED ORDER — CHLORDIAZEPOXIDE HCL 25 MG PO CAPS
25.0000 mg | ORAL_CAPSULE | Freq: Once | ORAL | Status: AC
  Administered 2013-04-21: 25 mg via ORAL
  Filled 2013-04-20: qty 1

## 2013-04-20 MED ORDER — THIAMINE HCL 100 MG/ML IJ SOLN: 100.0000 mg | Freq: Once | INTRAMUSCULAR | Status: DC

## 2013-04-20 MED ORDER — METHOCARBAMOL 500 MG PO TABS: 500.0000 mg | ORAL_TABLET | Freq: Three times a day (TID) | ORAL | Status: DC | PRN

## 2013-04-20 MED ORDER — CHLORDIAZEPOXIDE HCL 25 MG PO CAPS
25.0000 mg | ORAL_CAPSULE | ORAL | Status: AC
  Administered 2013-04-23 – 2013-04-24 (×2): 25 mg via ORAL
  Filled 2013-04-20 (×2): qty 1

## 2013-04-20 MED ORDER — HYDROXYZINE HCL 25 MG PO TABS: 25.0000 mg | ORAL_TABLET | Freq: Four times a day (QID) | ORAL | Status: DC | PRN

## 2013-04-20 MED ORDER — ADULT MULTIVITAMIN W/MINERALS CH
1.0000 | ORAL_TABLET | Freq: Every day | ORAL | Status: DC
  Administered 2013-04-21 – 2013-04-25 (×5): 1 via ORAL
  Filled 2013-04-20 (×7): qty 1

## 2013-04-20 MED ORDER — ONDANSETRON 4 MG PO TBDP: 4.0000 mg | ORAL_TABLET | Freq: Four times a day (QID) | ORAL | Status: DC | PRN

## 2013-04-20 MED ORDER — NICOTINE 21 MG/24HR TD PT24
21.0000 mg | MEDICATED_PATCH | Freq: Every day | TRANSDERMAL | Status: DC
  Administered 2013-04-20 – 2013-04-25 (×6): 21 mg via TRANSDERMAL
  Filled 2013-04-20 (×8): qty 1

## 2013-04-20 MED ORDER — VITAMIN B-1 100 MG PO TABS
100.0000 mg | ORAL_TABLET | Freq: Every day | ORAL | Status: DC
  Administered 2013-04-21 – 2013-04-25 (×5): 100 mg via ORAL
  Filled 2013-04-20 (×7): qty 1

## 2013-04-20 MED ORDER — LOPERAMIDE HCL 2 MG PO CAPS
2.0000 mg | ORAL_CAPSULE | ORAL | Status: DC | PRN
  Administered 2013-04-21 (×2): 2 mg via ORAL

## 2013-04-20 MED ORDER — NAPROXEN 500 MG PO TABS: 500.0000 mg | ORAL_TABLET | Freq: Two times a day (BID) | ORAL | Status: DC | PRN

## 2013-04-20 MED ORDER — CHLORDIAZEPOXIDE HCL 25 MG PO CAPS
25.0000 mg | ORAL_CAPSULE | Freq: Four times a day (QID) | ORAL | Status: AC | PRN
  Administered 2013-04-23 (×2): 25 mg via ORAL
  Filled 2013-04-20 (×2): qty 1
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
## 2013-04-20 NOTE — Progress Notes (Signed)
Adult Psychoeducational Group Note  Date:  04/20/2013 Time:  9:00 AM  Group Topic/Focus:  Dimensions of Wellness:   The focus of this group is to introduce the topic of wellness and discuss the role each dimension of wellness plays in total health.  Participation Level:  Did Not Attend  Additional Comments:  Pt. was in the consult room during group.   Harold Barban E 04/20/2013, 11:29 AM
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
## 2013-04-20 NOTE — [facility] Group Notes (Signed)
[facility] LCSW Group Therapy  04/20/2013  1:15 PM   Type of Therapy:  Group Therapy  Participation Level:  Active  Participation Quality:  Appropriate and Attentive  Affect:  Appropriate, Depressed and Flat  Cognitive:  Alert and Appropriate  Insight:  Developing/Improving and Engaged  Engagement in Therapy:  Developing/Improving and Engaged  Modes of Intervention:  Activity, Clarification, Discussion, Education, Exploration, Limit-setting, Orientation, Problem-solving, Rapport Building, Reality Testing, Socialization and Support  Summary of Progress/Problems:Patient was attentive and engaged with speaker from [employer].  Patient was attentive to speaker while they shared their story of dealing with mental health and overcoming it.  Patient expressed interest in their programs and services and received information on their agency.  Patient processed ways they can relate to the speaker.     Kehaulani Fruin Horton, LCSWA 04/20/2013 3:16 PM   
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
## 2013-04-20 NOTE — Progress Notes (Signed)
04/20/2013         Time: 2130       Group Topic/Focus: Rosezella [place]  Participation Level: Did not attend  Participation Quality: Other:did not attend  Affect: did not attend  Cognitive: did not attend   Additional Comments: did not attend   Annell Greening [facility] 04/20/2013 10:43 PM

## 2013-04-21 DIAGNOSIS — F1120 Opioid dependence, uncomplicated: Secondary | ICD-10-CM

## 2013-04-21 DIAGNOSIS — F1310 Sedative, hypnotic or anxiolytic abuse, uncomplicated: Secondary | ICD-10-CM

## 2013-04-21 MED ORDER — ONDANSETRON 4 MG PO TBDP
4.0000 mg | ORAL_TABLET | Freq: Four times a day (QID) | ORAL | Status: DC | PRN
  Filled 2013-04-21: qty 1

## 2013-04-21 MED ORDER — METHOCARBAMOL 500 MG PO TABS
500.0000 mg | ORAL_TABLET | Freq: Three times a day (TID) | ORAL | Status: DC | PRN
  Administered 2013-04-21 – 2013-04-25 (×6): 500 mg via ORAL
  Filled 2013-04-21 (×6): qty 1

## 2013-04-21 MED ORDER — CLONIDINE HCL 0.1 MG PO TABS
0.1000 mg | ORAL_TABLET | Freq: Every day | ORAL | Status: DC
  Administered 2013-04-25: 0.1 mg via ORAL
  Filled 2013-04-21 (×2): qty 1

## 2013-04-21 MED ORDER — CLONIDINE HCL 0.1 MG PO TABS
0.1000 mg | ORAL_TABLET | ORAL | Status: AC
  Administered 2013-04-23 – 2013-04-24 (×4): 0.1 mg via ORAL
  Filled 2013-04-21 (×4): qty 1

## 2013-04-21 MED ORDER — NAPROXEN 500 MG PO TABS: 500.0000 mg | ORAL_TABLET | Freq: Two times a day (BID) | ORAL | Status: DC | PRN

## 2013-04-21 MED ORDER — DICYCLOMINE HCL 20 MG PO TABS: 20.0000 mg | ORAL_TABLET | Freq: Four times a day (QID) | ORAL | Status: DC | PRN

## 2013-04-21 MED ORDER — RISPERIDONE 0.5 MG PO TBDP
0.5000 mg | ORAL_TABLET | Freq: Every day | ORAL | Status: DC
  Administered 2013-04-21 – 2013-04-22 (×2): 0.5 mg via ORAL
  Filled 2013-04-21 (×4): qty 1

## 2013-04-21 MED ORDER — CLONIDINE HCL 0.1 MG PO TABS
0.1000 mg | ORAL_TABLET | Freq: Four times a day (QID) | ORAL | Status: AC
  Administered 2013-04-21 – 2013-04-22 (×7): 0.1 mg via ORAL
  Filled 2013-04-21 (×7): qty 1

## 2013-04-21 MED ORDER — HYDROXYZINE HCL 25 MG PO TABS
25.0000 mg | ORAL_TABLET | Freq: Four times a day (QID) | ORAL | Status: DC | PRN
  Administered 2013-04-21 – 2013-04-25 (×12): 25 mg via ORAL

## 2013-04-21 NOTE — Progress Notes (Signed)
[facility] Group Notes:  (Nursing/MHT/Case Management/Adjunct)  Date:  04/21/2013  Time:  1:45 PM  Type of Therapy:  Therapeutic Activity  Participation Level:  Active  Participation Quality:  Appropriate, Attentive and Sharing  Affect:  Appropriate  Cognitive:  Appropriate  Insight:  Appropriate and Good  Engagement in Group:  Engaged  Modes of Intervention:  Activity, Discussion, Exploration and Socialization  Summary of Progress/Problems: Lenee attended and participated in coping skills pictionary, where one person in the group comes up and chooses a coping skill and draws a picture on the board and patient guesses what the picture is. Patients learned how communication was a big part of the game and how you interpret and share information with others to get your point across.    Karleen Hampshire Brittini 04/21/2013, 1:45 PM

## 2013-04-21 NOTE — [facility] Group Notes (Signed)
[facility] LCSW Aftercare Discharge Planning Group Note   04/21/2013  8:45 AM  Participation Quality:  Alert and Appropriate   Mood/Affect:  Appropriate, Flat, Depressed  Depression Rating:  8  Anxiety Rating:  9  Thoughts of Suicide:  Pt denies SI, endorses HI stating she wants to blow people up outside of the hospital but wouldn't elaborate on who  Will you contract for safety?   Yes  Current AVH:  No  Plan for Discharge/Comments:  Pt attended discharge planning group and actively participated in group.  CSW provided pt with today's workbook.  Pt states that she will return home in [place].  Pt has follow up scheduled at [facility] for medication management and therapy.    Transportation Means: Pt reports access to transportation  Supports: No supports mentioned at this time  Reyes Ivan, LCSWA 04/21/2013 10:00 AM

## 2013-04-21 NOTE — Tx Team (Signed)
Interdisciplinary Treatment Plan Update (Adult)  Date: 04/21/2013  Time Reviewed:  9:45 AM  Progress in Treatment: Attending groups: Yes Participating in groups:  Yes Taking medication as prescribed:  Yes Tolerating medication:  Yes Family/Significant othe contact made: CSW assessing  Patient understands diagnosis:  Yes Discussing patient identified problems/goals with staff:  Yes Medical problems stabilized or resolved:  Yes Denies suicidal/homicidal ideation: Yes Issues/concerns per patient self-inventory:  Yes Other:  New problem(s) identified: N/A  Discharge Plan or Barriers: Pt has follow up scheduled at [facility] for medication management and therapy.    Reason for Continuation of Hospitalization: Anxiety Depression Medication Stabilization  Comments: N/A  Estimated length of stay: 3-4 days, possible d/c on Monday  For review of initial/current patient goals, please see plan of care.  Attendees: Patient:     Family:     Physician:  Dr. Javier Glazier 04/21/2013 10:13 AM   Nursing:   Rowan Blase, RN 04/21/2013 10:13 AM   Clinical Social Worker:  Reyes Ivan, LCSWA 04/21/2013 10:13 AM   Other: Verne Spurr, PA 04/21/2013 10:13 AM   Other:  Alease Frame, RN 04/21/2013 10:13 AM   Other:  Juline Patch, LCSW 04/21/2013 10:13 AM   Other:  Onnie Boer, case manager 04/21/2013 10:13 AM   Other:    Other:    Other:    Other:    Other:    Other:     Scribe for Treatment Team:   Carmina Miller, 04/21/2013 10:13 AM

## 2013-04-21 NOTE — Progress Notes (Signed)
D.  Pt. Denies SI and reports that she has not heard any voices this evening.   Pt. Contracts for safety.  Pt. Attending groups. A.  Encouragement and support given. R.  Pt. Receptive.

## 2013-04-21 NOTE — Progress Notes (Signed)
[facility] MD Progress Note  04/21/2013 12:04 PM Shelby Hatfield  MRN:  [id number] Subjective:  "I'm going through withdrawal. I know I'm addicted to the hydrocodone." Diagnosis:  Opiate dependence                      Benzodiazepine abuse  ADL's:  Intact  Sleep: Fair  Appetite:  Poor  Suicidal Ideation:  denies Homicidal Ideation:  Yes, but has no plan, and no intent AEB (as evidenced by):  Psychiatric Specialty Exam: Review of Systems  Constitutional: Negative.  Negative for fever, chills, weight loss, malaise/fatigue and diaphoresis.  HENT: Negative for congestion and sore throat.   Eyes: Negative for blurred vision, double vision and photophobia.  Respiratory: Negative for cough, shortness of breath and wheezing.   Cardiovascular: Negative for chest pain, palpitations and PND.  Gastrointestinal: Negative for heartburn, nausea, vomiting, abdominal pain, diarrhea and constipation.  Musculoskeletal: Negative for myalgias, joint pain and falls.  Neurological: Negative for dizziness, tingling, tremors, sensory change, speech change, focal weakness, seizures, loss of consciousness, weakness and headaches.  Endo/Heme/Allergies: Negative for polydipsia. Does not bruise/bleed easily.  Psychiatric/Behavioral: Negative for depression, suicidal ideas, hallucinations, memory loss and substance abuse. The patient is not nervous/anxious and does not have insomnia.     Blood pressure 123/87, pulse 94, temperature 98.1 F (36.7 C), temperature source Oral, resp. rate 16, height 5' 0.63" (1.54 m), weight 103.42 kg (228 lb), last menstrual period 03/18/2013.Body mass index is 43.61 kg/(m^2).  General Appearance: Casual  Eye Contact::  Good  Speech:  Clear and Coherent  Volume:  Normal  Mood:  Anxious and Irritable  Affect:  Congruent  Thought Process:  Circumstantial  Orientation:  Full (Time, Place, and Person)  Thought Content:  WDL  Suicidal Thoughts:  No  Homicidal Thoughts:  Yes.  without  intent/plan  Memory:  Immediate;   Fair  Judgement:  Impaired  Insight:  Lacking  Psychomotor Activity:  Increased, Restlessness, Tremor and foot bouncing  Concentration:  Fair  Recall:  Fair  Akathisia:  No  Handed:  Left  AIMS (if indicated):     Assets:  Communication Skills Desire for Improvement Housing Physical Health Resilience  Sleep:  Number of Hours: 5.75   Current Medications: Current Facility-Administered Medications  Medication Dose Route Frequency Provider Last Rate Last Dose  . acetaminophen (TYLENOL) tablet 650 mg  650 mg Oral Q6H PRN Rachael Fee, MD   650 mg at 04/21/13 0617  . albuterol (PROVENTIL HFA;VENTOLIN HFA) 108 (90 BASE) MCG/ACT inhaler 1 puff  1 puff Inhalation Q6H PRN Rachael Fee, MD      . alum & mag hydroxide-simeth (MAALOX/MYLANTA) 200-200-20 MG/5ML suspension 30 mL  30 mL Oral Q4H PRN Rachael Fee, MD      . chlordiazePOXIDE (LIBRIUM) capsule 25 mg  25 mg Oral Q6H PRN Verne Spurr, PA-C      . chlordiazePOXIDE (LIBRIUM) capsule 25 mg  25 mg Oral QID Verne Spurr, PA-C   25 mg at 04/21/13 0806   Followed by  . [START ON 04/22/2013] chlordiazePOXIDE (LIBRIUM) capsule 25 mg  25 mg Oral TID Verne Spurr, PA-C       Followed by  . [START ON 04/23/2013] chlordiazePOXIDE (LIBRIUM) capsule 25 mg  25 mg Oral BH-qamhs Verne Spurr, PA-C       Followed by  . [START ON 04/25/2013] chlordiazePOXIDE (LIBRIUM) capsule 25 mg  25 mg Oral Daily Verne Spurr, PA-C      .  cholecalciferol (VITAMIN D) tablet 5,000 Units  5,000 Units Oral Daily Nehemiah Settle, MD   5,000 Units at 04/21/13 0805  . cloNIDine (CATAPRES) tablet 0.1 mg  0.1 mg Oral QID Verne Spurr, PA-C       Followed by  . [START ON 04/23/2013] cloNIDine (CATAPRES) tablet 0.1 mg  0.1 mg Oral BH-qamhs Verne Spurr, PA-C       Followed by  . [START ON 04/25/2013] cloNIDine (CATAPRES) tablet 0.1 mg  0.1 mg Oral QAC breakfast Verne Spurr, PA-C      . dicyclomine (BENTYL) tablet 20 mg  20 mg  Oral Q6H PRN Verne Spurr, PA-C      . ferrous sulfate tablet 325 mg  325 mg Oral BID WC Rachael Fee, MD   325 mg at 04/21/13 0806  . gabapentin (NEURONTIN) capsule 300 mg  300 mg Oral TID Rachael Fee, MD   300 mg at 04/21/13 1610  . hydrOXYzine (ATARAX/VISTARIL) tablet 25 mg  25 mg Oral Q6H PRN Verne Spurr, PA-C      . lamoTRIgine (LAMICTAL) tablet 200 mg  200 mg Oral Daily Rachael Fee, MD   200 mg at 04/21/13 0806  . loperamide (IMODIUM) capsule 2-4 mg  2-4 mg Oral PRN Verne Spurr, PA-C      . magnesium hydroxide (MILK OF MAGNESIA) suspension 30 mL  30 mL Oral Daily PRN Rachael Fee, MD      . meloxicam [facility]) tablet 15 mg  15 mg Oral Daily Rachael Fee, MD   15 mg at 04/21/13 0806  . methocarbamol (ROBAXIN) tablet 500 mg  500 mg Oral Q8H PRN Verne Spurr, PA-C      . multivitamin with minerals tablet 1 tablet  1 tablet Oral Daily Verne Spurr, PA-C   1 tablet at 04/21/13 0806  . nicotine (NICODERM CQ - dosed in mg/24 hours) patch 21 mg  21 mg Transdermal Daily Nehemiah Settle, MD   21 mg at 04/21/13 0807  . ondansetron (ZOFRAN-ODT) disintegrating tablet 4 mg  4 mg Oral Q6H PRN Verne Spurr, PA-C      . thiamine (B-1) injection 100 mg  100 mg Intramuscular Once [employer], PA-C      . thiamine (VITAMIN B-1) tablet 100 mg  100 mg Oral Daily Verne Spurr, PA-C   100 mg at 04/21/13 0806  . tiotropium (SPIRIVA) inhalation capsule 18 mcg  18 mcg Inhalation Daily Rachael Fee, MD   18 mcg at 04/21/13 [phone]  . traZODone (DESYREL) tablet 50 mg  50 mg Oral QHS PRN,MR X 1 Rachael Fee, MD   50 mg at 04/20/13 2235    Lab Results:  Results for orders placed during the hospital encounter of 04/19/13 (from the past 48 hour(s))  CBC     Status: Abnormal   Collection Time    04/19/13  7:48 PM      Result Value Range   WBC 13.7 (*) 4.0 - 10.5 K/uL   RBC 4.92  3.87 - 5.11 MIL/uL   Hemoglobin 13.0  12.0 - 15.0 g/dL   HCT 54.0  98.1 - 19.1 %   MCV 80.7  78.0 - 100.0 fL    MCH 26.4  26.0 - 34.0 pg   MCHC 32.7  30.0 - 36.0 g/dL   RDW 47.8  29.5 - 62.1 %   Platelets 444 (*) 150 - 400 K/uL  COMPREHENSIVE METABOLIC PANEL     Status: Abnormal   Collection Time  04/19/13  7:48 PM      Result Value Range   Sodium 141  135 - 145 mEq/L   Potassium 3.7  3.5 - 5.1 mEq/L   Chloride 105  96 - 112 mEq/L   CO2 23  19 - 32 mEq/L   Glucose, Bld 116 (*) 70 - 99 mg/dL   BUN 9  6 - 23 mg/dL   Creatinine, Ser 1.30  0.50 - 1.10 mg/dL   Calcium 9.7  8.4 - 86.5 mg/dL   Total Protein 7.5  6.0 - 8.3 g/dL   Albumin 3.8  3.5 - 5.2 g/dL   AST 15  0 - 37 U/L   ALT 14  0 - 35 U/L   Alkaline Phosphatase 91  39 - 117 U/L   Total Bilirubin 0.1 (*) 0.3 - 1.2 mg/dL   GFR calc non Af Amer >90  >90 mL/min   GFR calc Af Amer >90  >90 mL/min   Comment:            The eGFR has been calculated     using the CKD EPI equation.     This calculation has not been     validated in all clinical     situations.     eGFR's persistently     <90 mL/min signify     possible Chronic Kidney Disease.  ETHANOL     Status: None   Collection Time    04/19/13  7:48 PM      Result Value Range   Alcohol, Ethyl (B) <11  0 - 11 mg/dL   Comment:            LOWEST DETECTABLE LIMIT FOR     SERUM ALCOHOL IS 11 mg/dL     FOR MEDICAL PURPOSES ONLY  TSH     Status: None   Collection Time    04/19/13  7:48 PM      Result Value Range   TSH 1.109  0.350 - 4.500 uIU/mL  PREGNANCY, URINE     Status: None   Collection Time    04/19/13  8:59 PM      Result Value Range   Preg Test, Ur NEGATIVE  NEGATIVE   Comment:            THE SENSITIVITY OF THIS     METHODOLOGY IS >20 mIU/mL.  URINALYSIS, ROUTINE W REFLEX MICROSCOPIC     Status: Abnormal   Collection Time    04/19/13  8:59 PM      Result Value Range   Color, Urine YELLOW  YELLOW   APPearance CLEAR  CLEAR   Specific Gravity, Urine 1.031 (*) 1.005 - 1.030   pH 5.5  5.0 - 8.0   Glucose, UA NEGATIVE  NEGATIVE mg/dL   Hgb urine dipstick NEGATIVE   NEGATIVE   Bilirubin Urine NEGATIVE  NEGATIVE   Ketones, ur NEGATIVE  NEGATIVE mg/dL   Protein, ur NEGATIVE  NEGATIVE mg/dL   Urobilinogen, UA 0.2  0.0 - 1.0 mg/dL   Nitrite NEGATIVE  NEGATIVE   Leukocytes, UA NEGATIVE  NEGATIVE   Comment: MICROSCOPIC NOT DONE ON URINES WITH NEGATIVE PROTEIN, BLOOD, LEUKOCYTES, NITRITE, OR GLUCOSE <1000 mg/dL.  URINE RAPID DRUG SCREEN (HOSP PERFORMED)     Status: Abnormal   Collection Time    04/19/13  8:59 PM      Result Value Range   Opiates POSITIVE (*) NONE DETECTED   Cocaine NONE DETECTED  NONE DETECTED   Benzodiazepines POSITIVE (*)  NONE DETECTED   Amphetamines NONE DETECTED  NONE DETECTED   Tetrahydrocannabinol NONE DETECTED  NONE DETECTED   Barbiturates POSITIVE (*) NONE DETECTED   Comment:            DRUG SCREEN FOR MEDICAL PURPOSES     ONLY.  IF CONFIRMATION IS NEEDED     FOR ANY PURPOSE, NOTIFY LAB     WITHIN 5 DAYS.                LOWEST DETECTABLE LIMITS     FOR URINE DRUG SCREEN     Drug Class       Cutoff (ng/mL)     Amphetamine      1000     Barbiturate      200     Benzodiazepine   200     Tricyclics       300     Opiates          300     Cocaine          300     THC              50  COMPREHENSIVE METABOLIC PANEL     Status: Abnormal   Collection Time    04/20/13  6:15 AM      Result Value Range   Sodium 138  135 - 145 mEq/L   Potassium 4.2  3.5 - 5.1 mEq/L   Chloride 104  96 - 112 mEq/L   CO2 23  19 - 32 mEq/L   Glucose, Bld 111 (*) 70 - 99 mg/dL   BUN 13  6 - 23 mg/dL   Creatinine, Ser 4.09  0.50 - 1.10 mg/dL   Calcium 9.0  8.4 - 81.1 mg/dL   Total Protein 6.5  6.0 - 8.3 g/dL   Albumin 3.3 (*) 3.5 - 5.2 g/dL   AST 12  0 - 37 U/L   ALT 11  0 - 35 U/L   Alkaline Phosphatase 80  39 - 117 U/L   Total Bilirubin 0.2 (*) 0.3 - 1.2 mg/dL   GFR calc non Af Amer >90  >90 mL/min   GFR calc Af Amer >90  >90 mL/min   Comment:            The eGFR has been calculated     using the CKD EPI equation.     This calculation has  not been     validated in all clinical     situations.     eGFR's persistently     <90 mL/min signify     possible Chronic Kidney Disease.    Physical Findings: AIMS:  , ,  ,  ,    CIWA:  CIWA-Ar Total: 3 COWS:  COWS Total Score: 5  Treatment Plan Summary: Daily contact with patient to assess and evaluate symptoms and progress in treatment Medication management  Plan: 1. Reassurance and supportive care regarding detox from opiates. 2. Continue current plan of care. 3. Will use low dose Risperdal at hs for sleep and psychosis. 4. Will follow 5. ELOS:  3-4 days. Medical Decision Making Problem Points:  Established problem, stable/improving (1) and New problem, with no additional work-up planned (3) Data Points:  Review or order clinical lab tests (1)  I certify that inpatient services furnished can reasonably be expected to improve the patient's condition.   Rona Ravens. Zaylin Pistilli RPAC 12:16 PM 04/21/2013

## 2013-04-21 NOTE — Progress Notes (Signed)
Patient ID: Shelby Hatfield, female   DOB: March 13, 1976, 37 y.o.   MRN: [id number]   D: +ve AH, but not command, they just tell her she is worthless and situations are hopeless.  Pt denies SI/HI/VH. Pt is pleasant and cooperative. Pt states she was in severe depression due to the family pressures. Pt states she has over 10 people living at her house and she is the only one paying the bills.   A: Pt was offered support and encouragement. Pt was given scheduled medications. Pt was encourage to attend groups. Q 15 minute checks were done for safety.   R:Pt attends groups and interacts well with peers and staff. Pt is taking medication. Pt receptive to treatment and safety maintained on unit.

## 2013-04-21 NOTE — [facility] Group Notes (Signed)
[facility] LCSW Group Therapy  Feelings Around Relapse 1:15 -2:30        04/21/2013  3:43 PM   Type of Therapy:  Group Therapy  Participation Level:  Appropriate  Participation Quality:  Appropriate  Affect:  Appropriate  Cognitive:  Attentive Appropriate  Insight:  Engaged  Engagement in Therapy:  Engaged  Modes of Intervention:  Discussion Exploration Problem-Solving Supportive  Summary of Progress/Problems:  The topic for today was feelings around relapse.  She shared acting out on anger would be her relapse.  She talked about how she will curse others and threaten her daughter.    Patient processed feelings toward relapse and was able to relate to peers. Patient identified coping skills that can be used to prevent a relapse.   Wynn Banker 04/21/2013  3:43 PM

## 2013-04-21 NOTE — Progress Notes (Signed)
Adult Psychoeducational Group Note  Date:  04/21/2013 Time:  9:43 PM  Group Topic/Focus:  Wrap-Up Group:   The focus of this group is to help patients review their daily goal of treatment and discuss progress on daily workbooks.  Participation Level:  Active  Participation Quality:  Appropriate  Affect:  Appropriate  Cognitive:  Alert and Appropriate  Insight: Good  Engagement in Group:  Engaged  Modes of Intervention:  Discussion  Additional Comments:    Shelby Hatfield A 04/21/2013, 9:43 PM

## 2013-04-21 NOTE — Progress Notes (Signed)
D had to request med for sleep last nite, appetite is improving, depressed 9-10 today and hopeless 8/10 today, concerned and upset she is the only "bread winner" in a house of more than 10 adults, taking meds as ordered by MD, her goal today is to let her older kids go and do for themselves, attending groups and participating. A q50min safety checks continue and support offered, encouraged to continue attending group R patient remains safe on the unit

## 2013-04-21 NOTE — Progress Notes (Signed)
Adult Psychoeducational Group Note  Date:  04/21/2013 Time:  12:31 PM  Group Topic/Focus:  Relapse Prevention Planning:   The focus of this group is to define relapse and discuss the need for planning to combat relapse.  Participation Level:  Active  Participation Quality:  Appropriate, Attentive and Sharing  Affect:  Appropriate  Cognitive:  Alert and Appropriate  Insight: Good  Engagement in Group:  Engaged  Modes of Intervention:  Activity, Discussion, Socialization and Support  Additional Comments:  Pt was very engaged in group. Pt shared many times about her personal triggers, early warning signs, and high risk situations that lead to relapse.   Cathlean Cower 04/21/2013, 12:31 PM

## 2013-04-21 NOTE — [facility] Suicide Risk Assessment (Signed)
[facility] INPATIENT:  Family/Significant Other Suicide Prevention Education  Suicide Prevention Education:  Education Completed; Shelby Hatfield - mother [phone]),  (name of family member/significant other) has been identified by the patient as the family member/significant other with whom the patient will be residing, and identified as the person(s) who will aid the patient in the event of a mental health crisis (suicidal ideations/suicide attempt).  With written consent from the patient, the family member/significant other has been provided the following suicide prevention education, prior to the and/or following the discharge of the patient.  The suicide prevention education provided includes the following:  Suicide risk factors  Suicide prevention and interventions  National Suicide Hotline telephone number  [facility] assessment telephone number  [facility] Emergency Assistance 911  [facility] and/or Residential Mobile Crisis Unit telephone number  Request made of family/significant other to:  Remove weapons (e.g., guns, rifles, knives), all items previously/currently identified as safety concern.    Remove drugs/medications (over-the-counter, prescriptions, illicit drugs), all items previously/currently identified as a safety concern.  The family member/significant other verbalizes understanding of the suicide prevention education information provided.  The family member/significant other agrees to remove the items of safety concern listed above.  Shelby Hatfield 04/21/2013, 12:18 PM

## 2013-04-22 MED ORDER — ASPIRIN-ACETAMINOPHEN-CAFFEINE 250-250-65 MG PO TABS
1.0000 | ORAL_TABLET | Freq: Once | ORAL | Status: AC
  Administered 2013-04-22: 1 via ORAL
  Filled 2013-04-22 (×2): qty 1

## 2013-04-22 MED ORDER — ONDANSETRON 4 MG PO TBDP
4.0000 mg | ORAL_TABLET | Freq: Three times a day (TID) | ORAL | Status: DC | PRN
  Administered 2013-04-22 – 2013-04-25 (×5): 4 mg via ORAL
  Filled 2013-04-22: qty 1

## 2013-04-22 NOTE — Progress Notes (Signed)
Shelby Hatfield is sad, depressed and anxious, yet she shares that she is " ready to get on with things..., ie straighten things out at home"... She is flat, depressed, but articulates her thoughts and feelings well. She is complaint with her POC, attends her groups and takes her scheduled meds  As per MD order. She completes her AM self inventory and on it she writes she cont to haver " off and on" SI wihtin the past 24 hrs, but she easily contracts fo r safety with this Clinical research associate. SHe                                        R Safety is  In place and POC  Is supported and maintained.

## 2013-04-22 NOTE — Progress Notes (Signed)
Adult Psychoeducational Group Note  Date:  04/22/2013 Time:  2:18 PM  Group Topic/Focus:  Healthy Communication:   The focus of this group is to discuss communication, barriers to communication, as well as healthy ways to communicate with others.  Participation Level:  Active  Participation Quality:  Appropriate, Sharing and Supportive  Affect:  Appropriate  Cognitive:  Appropriate  Insight: Appropriate and Good  Engagement in Group:  Engaged, Improving and Supportive  Modes of Intervention:  Discussion, Education, Socialization and Support  Additional Comments:  Pt stated she needed to work on setting boundaries with her mother because her mother tries to control and her and is unsupportive at times.  Caswell Corwin 04/22/2013, 2:18 PM

## 2013-04-22 NOTE — [facility] Group Notes (Signed)
[facility] LCSW Group Therapy  04/22/2013   3:00 PM   Type of Therapy:  Group Therapy  Participation Level:  Active  Participation Quality:  Appropriate and Attentive  Affect:  Appropriate, Flat and Depressed  Cognitive:  Alert and Appropriate  Insight:  Developing/Improving and Engaged  Engagement in Therapy:  Developing/Improving and Engaged  Modes of Intervention:  Clarification, Confrontation, Discussion, Education, Exploration, Limit-setting, Orientation, Problem-solving, Rapport Building, Dance movement psychotherapist, Socialization and Support  Summary of Progress/Problems: The main focus of today's process group was for the patient to identify ways in which they have in the past sabotaged their own recovery and reasons they may have done this/what they received from doing it. We then worked to identify a specific plan to avoid doing this when discharged from the hospital for this admission.  Pt shared that she self sabotages by being selfish and binge shopping, which then effects her finances.  Pt was able to discuss her plan to walk away from her adult daughter, as she now understands she can't control her actions and sees it is impacting her well being.  Pt showed great insight on her issues that brought her to the hospital.    Shelby Hatfield, [facility] 04/22/2013 2:27 PM

## 2013-04-22 NOTE — Progress Notes (Signed)
Patient ID: Shelby Hatfield, female   DOB: August 16, 1976, 37 y.o.   MRN: [id number] [facility] MD Progress Note  04/22/2013 4:04 PM Shelby Hatfield  MRN:  [id number]  Subjective:  Patient is seen in living room along with peers. She has complained about not getting enough pain medication. She has no apparent substance withdrawal today. 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 And seeking drugs with addiction potential - fioricet"  Diagnosis:  Opiate dependence                      Benzodiazepine abuse  ADL's:  Intact  Sleep: Fair  Appetite:  Poor  Suicidal Ideation:  denies Homicidal Ideation:  Yes, but has no plan, and no intent AEB (as evidenced by):  Psychiatric Specialty Exam: Review of Systems  Constitutional: Negative.  Negative for fever, chills, weight loss, malaise/fatigue and diaphoresis.  HENT: Negative for congestion and sore throat.   Eyes: Negative for blurred vision, double vision and photophobia.  Respiratory: Negative for cough, shortness of breath and wheezing.   Cardiovascular: Negative for chest pain, palpitations and PND.  Gastrointestinal: Negative for heartburn, nausea, vomiting, abdominal pain, diarrhea and constipation.  Musculoskeletal: Negative for myalgias, joint pain and falls.  Neurological: Negative for dizziness, tingling, tremors, sensory change, speech change, focal weakness, seizures, loss of consciousness, weakness and headaches.  Endo/Heme/Allergies: Negative for polydipsia. Does not bruise/bleed easily.  Psychiatric/Behavioral: Negative for depression, suicidal ideas, hallucinations, memory loss and substance abuse. The patient is not nervous/anxious and does not have insomnia.     Blood pressure 116/67, pulse 85, temperature 97.6 F (36.4 C), temperature source Oral, resp. rate 18, height 5' 0.63" (1.54 m), weight 103.42 kg (228 lb), last menstrual period 03/18/2013.Body mass index is 43.61 kg/(m^2).  General Appearance: Casual  Eye Contact::  Good   Speech:  Clear and Coherent  Volume:  Normal  Mood:  Anxious and Irritable  Affect:  Congruent  Thought Process:  Circumstantial  Orientation:  Full (Time, Place, and Person)  Thought Content:  WDL  Suicidal Thoughts:  No  Homicidal Thoughts:  Yes.  without intent/plan  Memory:  Immediate;   Fair  Judgement:  Impaired  Insight:  Lacking  Psychomotor Activity:  Increased, Restlessness, Tremor and foot bouncing  Concentration:  Fair  Recall:  Fair  Akathisia:  No  Handed:  Left  AIMS (if indicated):     Assets:  Communication Skills Desire for Improvement Housing Physical Health Resilience  Sleep:  Number of Hours: 6   Current Medications: Current Facility-Administered Medications  Medication Dose Route Frequency Provider Last Rate Last Dose  . acetaminophen (TYLENOL) tablet 650 mg  650 mg Oral Q6H PRN Rachael Fee, MD   650 mg at 04/22/13 1206  . albuterol (PROVENTIL HFA;VENTOLIN HFA) 108 (90 BASE) MCG/ACT inhaler 1 puff  1 puff Inhalation Q6H PRN Rachael Fee, MD      . alum & mag hydroxide-simeth (MAALOX/MYLANTA) 200-200-20 MG/5ML suspension 30 mL  30 mL Oral Q4H PRN Rachael Fee, MD      . chlordiazePOXIDE (LIBRIUM) capsule 25 mg  25 mg Oral Q6H PRN Verne Spurr, PA-C      . chlordiazePOXIDE (LIBRIUM) capsule 25 mg  25 mg Oral TID Verne Spurr, PA-C   25 mg at 04/22/13 1206   Followed by  . [START ON 04/23/2013] chlordiazePOXIDE (LIBRIUM) capsule 25 mg  25 mg Oral BH-qamhs Verne Spurr, PA-C       Followed by  . [  START ON 04/25/2013] chlordiazePOXIDE (LIBRIUM) capsule 25 mg  25 mg Oral Daily Verne Spurr, PA-C      . cholecalciferol (VITAMIN D) tablet 5,000 Units  5,000 Units Oral Daily Nehemiah Settle, MD   5,000 Units at 04/22/13 0816  . cloNIDine (CATAPRES) tablet 0.1 mg  0.1 mg Oral QID Verne Spurr, PA-C   0.1 mg at 04/22/13 1205   Followed by  . [START ON 04/23/2013] cloNIDine (CATAPRES) tablet 0.1 mg  0.1 mg Oral BH-qamhs Verne Spurr, PA-C        Followed by  . [START ON 04/25/2013] cloNIDine (CATAPRES) tablet 0.1 mg  0.1 mg Oral QAC breakfast Verne Spurr, PA-C      . dicyclomine (BENTYL) tablet 20 mg  20 mg Oral Q6H PRN Verne Spurr, PA-C      . ferrous sulfate tablet 325 mg  325 mg Oral BID WC Rachael Fee, MD   325 mg at 04/22/13 0815  . gabapentin (NEURONTIN) capsule 300 mg  300 mg Oral TID Rachael Fee, MD   300 mg at 04/22/13 1205  . hydrOXYzine (ATARAX/VISTARIL) tablet 25 mg  25 mg Oral Q6H PRN Verne Spurr, PA-C   25 mg at 04/22/13 1207  . lamoTRIgine (LAMICTAL) tablet 200 mg  200 mg Oral Daily Rachael Fee, MD   200 mg at 04/22/13 0814  . loperamide (IMODIUM) capsule 2-4 mg  2-4 mg Oral PRN Verne Spurr, PA-C   2 mg at 04/21/13 2127  . magnesium hydroxide (MILK OF MAGNESIA) suspension 30 mL  30 mL Oral Daily PRN Rachael Fee, MD      . meloxicam [facility]) tablet 15 mg  15 mg Oral Daily Rachael Fee, MD   15 mg at 04/22/13 0815  . methocarbamol (ROBAXIN) tablet 500 mg  500 mg Oral Q8H PRN Verne Spurr, PA-C   500 mg at 04/22/13 1610  . multivitamin with minerals tablet 1 tablet  1 tablet Oral Daily Verne Spurr, PA-C   1 tablet at 04/22/13 0815  . nicotine (NICODERM CQ - dosed in mg/24 hours) patch 21 mg  21 mg Transdermal Daily Nehemiah Settle, MD   21 mg at 04/22/13 0814  . ondansetron (ZOFRAN-ODT) disintegrating tablet 4 mg  4 mg Oral Q6H PRN Verne Spurr, PA-C      . risperiDONE (RISPERDAL M-TABS) disintegrating tablet 0.5 mg  0.5 mg Oral QHS Verne Spurr, PA-C   0.5 mg at 04/21/13 2151  . thiamine (B-1) injection 100 mg  100 mg Intramuscular Once [employer], PA-C      . thiamine (VITAMIN B-1) tablet 100 mg  100 mg Oral Daily Verne Spurr, PA-C   100 mg at 04/22/13 0815  . tiotropium (SPIRIVA) inhalation capsule 18 mcg  18 mcg Inhalation Daily Rachael Fee, MD   18 mcg at 04/22/13 0815  . traZODone (DESYREL) tablet 50 mg  50 mg Oral QHS PRN,MR X 1 Rachael Fee, MD   50 mg at 04/20/13 2235    Lab  Results:  No results found for this or any previous visit (from the past 48 hour(s)).  Physical Findings: AIMS: Facial and Oral Movements Muscles of Facial Expression: None, normal Lips and Perioral Area: None, normal Jaw: None, normal Tongue: None, normal,Extremity Movements Upper (arms, wrists, hands, fingers): None, normal Lower (legs, knees, ankles, toes): None, normal, Trunk Movements Neck, shoulders, hips: None, normal, Overall Severity Severity of abnormal movements (highest score from questions above): None, normal Incapacitation due to abnormal  movements: None, normal Patient's awareness of abnormal movements (rate only patient's report): No Awareness, Dental Status Current problems with teeth and/or dentures?: No Does patient usually wear dentures?: No  CIWA:  CIWA-Ar Total: 6 COWS:  COWS Total Score: 5  Treatment Plan Summary: Daily contact with patient to assess and evaluate symptoms and progress in treatment Medication management  Plan: 1. Reassurance and supportive care regarding detox from opiates. 2. Continue current plan of care. 3. Will use low dose Risperdal at hs for sleep and psychosis. 4. Will follow 5. ELOS:  2-3 days.  Medical Decision Making Problem Points:  Established problem, stable/improving (1) and New problem, with no additional work-up planned (3) Data Points:  Review or order clinical lab tests (1)  I certify that inpatient services furnished can reasonably be expected to improve the patient's condition.    Nehemiah Settle , MD  04/22/2013 4:07 PM

## 2013-04-22 NOTE — Progress Notes (Signed)
The focus of this group is to help patients review their daily goal of treatment and discuss progress on daily workbooks. Pt attended the evening group session and responded to all discussion prompts from the Writer. Pt reported having a good day on account of her mother and daughter coming to have lunch with her. When asked, Pt stated she had no additional needs from Nursing Staff this evening beyond medication. Pt's affect was appropriate.

## 2013-04-22 NOTE — Progress Notes (Signed)
Psychoeducational Group Note  Date:  [id number] Time:  0930  Group Topic/Focus:  Identifying Needs:   The focus of this group is to help patients identify their personal needs.   Participation Level:  Active  Participation Quality: good  Affect: flat  Cognitive: Intact   Insight:  good  Engagement in Group: engaged  Additional Comments: PDuke RN [employer]

## 2013-04-22 NOTE — Progress Notes (Addendum)
Psychoeducational Group Note  Date:  05/07/2012 Time: 1015  Group Topic/Focus:  Identifying Needs:   The focus of this group is to help patients identify their personal needs that have been historically problematic and identify healthy behaviors to address their needs.  Participation Level:  active Participation Quality: good Affect: flat Cognitive:intact    Insight:  good  Engagement in Group: engaged  Additional Comments: Pt was engaged in group, asked appropriate questions, shared personal life experience with the group and demonstrates willingness to process and understand her problems. PDuke RN [facility]

## 2013-04-22 NOTE — Progress Notes (Signed)
Patient ID: Shelby Hatfield, female   DOB: August 18, 1976, 37 y.o.   MRN: [id number] D: Patient lying in bed with eyes closed. Respirations even and non-labored. A: Staff will monitor on q 15 minutes, follow treatment plan, and give meds as ordered. R: Appears asleep.

## 2013-04-23 MED ORDER — OLANZAPINE 5 MG PO TABS
5.0000 mg | ORAL_TABLET | Freq: Every day | ORAL | Status: DC
  Administered 2013-04-23 – 2013-04-24 (×2): 5 mg via ORAL
  Filled 2013-04-23 (×6): qty 1

## 2013-04-23 NOTE — Progress Notes (Signed)
Reviewed the information documented and agree with the treatment plan.  Dauntae Derusha,JANARDHAHA R. 04/23/2013 7:43 PM 
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
## 2013-04-23 NOTE — Progress Notes (Signed)
Writer has observed the patient up and active, laughing and talking with peers. Patient comes to medication window c/o having withdrawal symptoms but when asked what her symptom is she was not able to name one. Patient requested a prn of librium which she received. Patient attended group this evening and participated, she reports family members visiting today which was pleasant. Patient was later observed getting upset after phone calls c/o family calling and stressing her out, pt reports that she will not be taking calls from them. Writer encouraged patient to focus on her care while here and not get caught up in family drama. Patient deneis si/hi/a/v hallucinations. Safety maintained on unit, will continue to monitor.

## 2013-04-23 NOTE — Progress Notes (Signed)
Patient ID: Shelby Hatfield, female   DOB: 12/12/1975, 37 y.o.   MRN: [id number] [facility] MD Progress Note  04/23/2013 6:29 PM Shelby Hatfield  MRN:  [id number]  Subjective: Patient seen and requesting Fioricet for pain. This is declined and she is encouraged to use her coping skill and to make use of the prn medications on withdrawal protocol. Patient complained of headache each night since starting Risperdal.  Diagnosis:  Opiate dependence                      Benzodiazepine abuse  ADL's:  Intact  Sleep: Fair  Appetite:  Poor  Suicidal Ideation:  denies Homicidal Ideation:  Denies today AEB (as evidenced by):  Psychiatric Specialty Exam: Review of Systems  Constitutional: Negative.  Negative for fever, chills, weight loss, malaise/fatigue and diaphoresis.  HENT: Negative for congestion and sore throat.   Eyes: Negative for blurred vision, double vision and photophobia.  Respiratory: Negative for cough, shortness of breath and wheezing.   Cardiovascular: Negative for chest pain, palpitations and PND.  Gastrointestinal: Negative for heartburn, nausea, vomiting, abdominal pain, diarrhea and constipation.  Musculoskeletal: Negative for myalgias, joint pain and falls.  Neurological: Negative for dizziness, tingling, tremors, sensory change, speech change, focal weakness, seizures, loss of consciousness, weakness and headaches.  Endo/Heme/Allergies: Negative for polydipsia. Does not bruise/bleed easily.  Psychiatric/Behavioral: Negative for depression, suicidal ideas, hallucinations, memory loss and substance abuse. The patient is not nervous/anxious and does not have insomnia.     Blood pressure 104/72, pulse 79, temperature 98 F (36.7 C), temperature source Oral, resp. rate 22, height 5' 0.63" (1.54 m), weight 103.42 kg (228 lb), last menstrual period 03/18/2013.Body mass index is 43.61 kg/(m^2).  General Appearance: Casual  Eye Contact::  Good  Speech:  Clear and Coherent  Volume:   Normal  Mood:  Depressed today  Affect:  Congruent  Thought Process:  Circumstantial  Orientation:  Full (Time, Place, and Person)  Thought Content:  WDL  Suicidal Thoughts:  No  Homicidal Thoughts:  no  Memory:  Immediate;   Fair  Judgement:  Impaired  Insight:  Lacking  Psychomotor Activity:  normal  Concentration:  Fair  Recall:  Fair  Akathisia:  No  Handed:  Left  AIMS (if indicated):     Assets:  Communication Skills Desire for Improvement Housing Physical Health Resilience  Sleep:  Number of Hours: 4.75   Current Medications: Current Facility-Administered Medications  Medication Dose Route Frequency Provider Last Rate Last Dose  . acetaminophen (TYLENOL) tablet 650 mg  650 mg Oral Q6H PRN Rachael Fee, MD   650 mg at 04/23/13 1425  . albuterol (PROVENTIL HFA;VENTOLIN HFA) 108 (90 BASE) MCG/ACT inhaler 1 puff  1 puff Inhalation Q6H PRN Rachael Fee, MD      . alum & mag hydroxide-simeth (MAALOX/MYLANTA) 200-200-20 MG/5ML suspension 30 mL  30 mL Oral Q4H PRN Rachael Fee, MD   30 mL at 04/23/13 1248  . chlordiazePOXIDE (LIBRIUM) capsule 25 mg  25 mg Oral Q6H PRN Verne Spurr, PA-C   25 mg at 04/23/13 1159  . chlordiazePOXIDE (LIBRIUM) capsule 25 mg  25 mg Oral BH-qamhs Verne Spurr, PA-C       Followed by  . [START ON 04/25/2013] chlordiazePOXIDE (LIBRIUM) capsule 25 mg  25 mg Oral Daily Verne Spurr, PA-C      . cholecalciferol (VITAMIN D) tablet 5,000 Units  5,000 Units Oral Daily Nehemiah Settle, MD   5,000  Units at 04/23/13 0748  . cloNIDine (CATAPRES) tablet 0.1 mg  0.1 mg Oral BH-qamhs Dhruvan Gullion, PA-C   0.1 mg at 04/23/13 0749   Followed by  . [START ON 04/25/2013] cloNIDine (CATAPRES) tablet 0.1 mg  0.1 mg Oral QAC breakfast Verne Spurr, PA-C      . dicyclomine (BENTYL) tablet 20 mg  20 mg Oral Q6H PRN Verne Spurr, PA-C      . ferrous sulfate tablet 325 mg  325 mg Oral BID WC Rachael Fee, MD   325 mg at 04/23/13 1708  . gabapentin  (NEURONTIN) capsule 300 mg  300 mg Oral TID Rachael Fee, MD   300 mg at 04/23/13 1708  . hydrOXYzine (ATARAX/VISTARIL) tablet 25 mg  25 mg Oral Q6H PRN Verne Spurr, PA-C   25 mg at 04/23/13 1425  . lamoTRIgine (LAMICTAL) tablet 200 mg  200 mg Oral Daily Rachael Fee, MD   200 mg at 04/23/13 0748  . loperamide (IMODIUM) capsule 2-4 mg  2-4 mg Oral PRN Verne Spurr, PA-C   2 mg at 04/21/13 2127  . magnesium hydroxide (MILK OF MAGNESIA) suspension 30 mL  30 mL Oral Daily PRN Rachael Fee, MD      . meloxicam [facility]) tablet 15 mg  15 mg Oral Daily Rachael Fee, MD   15 mg at 04/23/13 0748  . methocarbamol (ROBAXIN) tablet 500 mg  500 mg Oral Q8H PRN Verne Spurr, PA-C   500 mg at 04/23/13 1201  . multivitamin with minerals tablet 1 tablet  1 tablet Oral Daily Verne Spurr, PA-C   1 tablet at 04/23/13 0748  . nicotine (NICODERM CQ - dosed in mg/24 hours) patch 21 mg  21 mg Transdermal Daily Nehemiah Settle, MD   21 mg at 04/23/13 0603  . ondansetron (ZOFRAN-ODT) disintegrating tablet 4 mg  4 mg Oral Q6H PRN Verne Spurr, PA-C      . ondansetron (ZOFRAN-ODT) disintegrating tablet 4 mg  4 mg Oral Q8H PRN Nehemiah Settle, MD   4 mg at 04/23/13 0445  . risperiDONE (RISPERDAL M-TABS) disintegrating tablet 0.5 mg  0.5 mg Oral QHS Verne Spurr, PA-C   0.5 mg at 04/22/13 2122  . thiamine (B-1) injection 100 mg  100 mg Intramuscular Once [employer], PA-C      . thiamine (VITAMIN B-1) tablet 100 mg  100 mg Oral Daily Verne Spurr, PA-C   100 mg at 04/23/13 0748  . tiotropium (SPIRIVA) inhalation capsule 18 mcg  18 mcg Inhalation Daily Rachael Fee, MD   18 mcg at 04/23/13 0747  . traZODone (DESYREL) tablet 50 mg  50 mg Oral QHS PRN,MR X 1 Rachael Fee, MD   50 mg at 04/22/13 2122    Lab Results:  No results found for this or any previous visit (from the past 48 hour(s)).  Physical Findings: AIMS: Facial and Oral Movements Muscles of Facial Expression: None, normal Lips  and Perioral Area: None, normal Jaw: None, normal Tongue: None, normal,Extremity Movements Upper (arms, wrists, hands, fingers): None, normal Lower (legs, knees, ankles, toes): None, normal, Trunk Movements Neck, shoulders, hips: None, normal, Overall Severity Severity of abnormal movements (highest score from questions above): None, normal Incapacitation due to abnormal movements: None, normal Patient's awareness of abnormal movements (rate only patient's report): No Awareness, Dental Status Current problems with teeth and/or dentures?: No Does patient usually wear dentures?: No  CIWA:  CIWA-Ar Total: 1 COWS:  COWS Total Score: 3  Treatment Plan Summary: Daily contact with patient to assess and evaluate symptoms and progress in treatment Medication management  Plan: 1. Reassurance and supportive care regarding detox from opiates. 2. Continue current plan of care. 3. Will D/C Risperdal due to headache and add Zyprexa 5mg  at hs for psychosis. 4. Will follow 5. ELOS:  2-3 days.  Medical Decision Making Problem Points:  Established problem, stable/improving (1) and New problem, with no additional work-up planned (3) Data Points:  Review or order clinical lab tests (1)  I certify that inpatient services furnished can reasonably be expected to improve the patient's condition.   Rona Ravens. Kc Summerson RPAC 6:35 PM 04/23/2013

## 2013-04-23 NOTE — Progress Notes (Addendum)
[name] remains somatically focused, frequently seeking this Clinical research associate out, asking her " is it time for me to get my robaxin? Can i have another librium? Or clonidine? I' just feel awful today".   A This Clinical research associate speaks with pt about her drug dependence, correlating it with Life Skills group from this AM where unhealthy coping skills were discussed. She admits that she is aware she needs to " Do better about this" She shares that she wants to go home, but she feels very anxious and like she's " not ready at this time".    R Safety is in place and POC  Intact.

## 2013-04-23 NOTE — Progress Notes (Signed)
Patient came to medication window requesting visteril for her anxiety, patient reports that her day had been ok, she reports talking with her mother on the phone today and her mother was talking down to her and this made her feel bad. Writer encouraged patient to focus on herself while here and if need be not to talk with her until she felt stronger. Patient attended group this evening and participated. Patient currently denies si/hi/a/v hallucinations, support and encouragement offered, safety maintained on unit with 15 min checks.  Writer informed patient of medications available if needed.

## 2013-04-23 NOTE — Progress Notes (Signed)
Psychoeducational Group Note  Date:  04/23/2013 Time: 1015 Group Topic/Focus:  Making Healthy Choices:   The focus of this group is to help patients identify negative/unhealthy choices they were using prior to admission and identify positive/healthier coping strategies to replace them upon discharge.  Participation Level:  Active  Participation Quality:  Appropriate  Affect:  Depressed  Cognitive:  Appropriate  Insight:  Engaged  Engagement in Group:  Limited  Additional Comments:    Rich Brave 3:00 PM. 04/23/2013

## 2013-04-23 NOTE — Progress Notes (Signed)
Psychoeducational Group Note  Date:  04/23/2013 Time:  0930 Group Topic/Focus:  Being Thankful: The group focuses on helping patients identify and verbalize things they are thankful for in their lives.  Participation Level:  Active  Participation Quality:  Appropriate  Affect:  Appropriate  Cognitive:  Alert  Insight:  Engaged  Engagement in Group:  Engaged  Additional Comments:    Rich Brave 6:01 PM. 04/23/2013

## 2013-04-24 MED ORDER — DOCUSATE SODIUM 100 MG PO CAPS
100.0000 mg | ORAL_CAPSULE | Freq: Two times a day (BID) | ORAL | Status: DC
  Administered 2013-04-24 – 2013-04-25 (×2): 100 mg via ORAL
  Filled 2013-04-24 (×6): qty 1

## 2013-04-24 MED ORDER — FLUOXETINE HCL 20 MG PO CAPS
20.0000 mg | ORAL_CAPSULE | Freq: Every day | ORAL | Status: DC
  Administered 2013-04-24 – 2013-04-25 (×2): 20 mg via ORAL
  Filled 2013-04-24 (×5): qty 1

## 2013-04-24 NOTE — Progress Notes (Signed)
Patient ID: Shelby Hatfield, female   DOB: November 08, 1975, 37 y.o.   MRN: [id number] D-Patient reports poor sleep and improving appetite.  Her energy level is normal and her ability to pay attention is poor.  She rates her depression at 5 and her anxiety is higher.  Patient took zofran for nauseas and then went to sleep.  She got  Panicky after talking with daughter because no one is doing what she wants them to at home. A- Talked with her about letting go of things she can't control and role playing interaction with daughter.  R- patient seemed calmer after this

## 2013-04-24 NOTE — Progress Notes (Signed)
Recreation Therapy Notes  Date: 07.28.2014 Time: 3:00pm Location: [address] Dayroom  Group Topic: Problem Solving, Communication, Team Work  Goal Area(s) Addresses:  Patient will effectively work in a team with other group members. Patient will verbalize skills needed to make activity successful.  Patient will verbalize ways to use skills used in group session post d/c.  Behavioral Response: Attentive, Engaged, Appropriate  Intervention: Problem Solving Activity  Activity: [employer]. Patients were given 20 spaghetti sticks and 11 marshmallows. Within teams patients were asked to build the tallest free standing structure.  Education: Customer service manager, Pharmacologist, Discharge Planning  Education Outcome: Acknowledges understanding  Clinical Observations/Feedback: Patient arrived late to group session. Upon arrival patient was invited to join the female team in the room, patient immediately engaged in group activity. Patient offered suggestions to group members and peers appeared to receive patient suggestions well. Patient was able to effectively communicate and work well with her team mates. Patient with peer ultimately successful at building a freestanding structure. Patient group attributed this to working well as a team and talking to each other. Patient did not spontaneously contribute to wrap up discussion, but did appear to actively listen to peers statements, by following the conversation, making eye contact with peers who were talking and nodding in agreement with statements made by peers or LRT.    Marykay Lex Kaan Tosh, LRT/CTRS  Jearl Klinefelter 04/24/2013 5:16 PM

## 2013-04-24 NOTE — [facility] Group Notes (Addendum)
[facility] LCSW Group Therapy          Overcoming Obstacles       1:15 -2:30        04/24/2013  3:06 PM     Type of Therapy:  Group Therapy  Participation Level:  Appropriate  Participation Quality:  Appropriate  Affect:  Appropriate, Alert  Cognitive:  Attentive Appropriate  Insight: Developing/Improving Engaged  Engagement in Therapy: Developing/Imprvoing Engaged  Modes of Intervention:  Discussion Exploration  Education Rapport BuildingProblem-Solving Support  Summary of Progress/Problems:  The main focus of today's group was overcoming  Obstacles.  Patient shared the obstacle she needs to overcome is addiction to pills.  She shared one of the things she will be doing is pain management to make sure meds are taken as prescribed.   Wynn Banker 04/24/2013   3:06 PM

## 2013-04-24 NOTE — Progress Notes (Signed)
Patient ID: Shelby Hatfield, female   DOB: 20-Apr-1976, 37 y.o.   MRN: [id number]   [facility] MD Progress Note  04/24/2013 1:54 PM [facility]  MRN:  [id number]  Subjective:  Patient states "I'm really anxious. I think the Lamictal is not helping my mood as much. I'm sleeping better than I before. But my anxiety is an eight. I just took a vistaril which really helps me calm down. I'm worried about my OCD symptoms. They stress me out. I go overboard organizing everything in my house. I can't handle it when things aren't in place and then I just shut down. Nobody was helping me at home and I was getting so overwhelmed."   Objective:  Patient is observed attending groups. She appears anxious during conversation. Patient is able to articulate her symptoms and expresses a desire to have her symptoms managed with non habit forming medications. Patient plans to enroll in a pain clinic after d/c.    Diagnosis:  Opiate dependence                      Benzodiazepine abuse  ADL's:  Intact  Sleep: Fair  Appetite:  Poor  Suicidal Ideation:  Passive SI to run truck off a bridge Homicidal Ideation:  Denies today AEB (as evidenced by):  Psychiatric Specialty Exam: Review of Systems  Constitutional: Negative.  Negative for fever, chills, weight loss, malaise/fatigue and diaphoresis.  HENT: Negative for congestion and sore throat.   Eyes: Negative.  Negative for blurred vision, double vision and photophobia.  Respiratory: Negative for cough, shortness of breath and wheezing.   Cardiovascular: Negative.  Negative for chest pain, palpitations and PND.  Gastrointestinal: Negative.  Negative for heartburn, nausea, vomiting, abdominal pain, diarrhea and constipation.  Musculoskeletal: Positive for back pain. Negative for myalgias, joint pain and falls.  Skin: Negative.   Neurological: Positive for headaches. Negative for dizziness, tingling, tremors, sensory change, speech change, focal weakness, seizures,  loss of consciousness and weakness.  Endo/Heme/Allergies: Negative.  Negative for polydipsia. Does not bruise/bleed easily.  Psychiatric/Behavioral: Positive for depression and substance abuse. Negative for suicidal ideas, hallucinations and memory loss. The patient is nervous/anxious and has insomnia.     Blood pressure 110/72, pulse 85, temperature 98 F (36.7 C), temperature source Oral, resp. rate 20, height 5' 0.63" (1.54 m), weight 103.42 kg (228 lb), last menstrual period 03/18/2013.Body mass index is 43.61 kg/(m^2).  General Appearance: Casual  Eye Contact::  Good  Speech:  Clear and Coherent  Volume:  Normal  Mood:  Depressed today  Affect:  Anxious  Thought Process:  Circumstantial  Orientation:  Full (Time, Place, and Person)  Thought Content:  WDL  Suicidal Thoughts:  No  Homicidal Thoughts:  no  Memory:  Immediate;   Fair  Judgement:  Impaired  Insight:  Lacking  Psychomotor Activity:  Restless  Concentration:  Fair  Recall:  Fair  Akathisia:  No  Handed:  Left  AIMS (if indicated):     Assets:  Communication Skills Desire for Improvement Housing Physical Health Resilience  Sleep:  Number of Hours: 4.75   Current Medications: Current Facility-Administered Medications  Medication Dose Route Frequency Provider Last Rate Last Dose  . acetaminophen (TYLENOL) tablet 650 mg  650 mg Oral Q6H PRN Rachael Fee, MD   650 mg at 04/24/13 0530  . albuterol (PROVENTIL HFA;VENTOLIN HFA) 108 (90 BASE) MCG/ACT inhaler 1 puff  1 puff Inhalation Q6H PRN Rachael Fee, MD      .  alum & mag hydroxide-simeth (MAALOX/MYLANTA) 200-200-20 MG/5ML suspension 30 mL  30 mL Oral Q4H PRN Rachael Fee, MD   30 mL at 04/23/13 2043  . [START ON 04/25/2013] chlordiazePOXIDE (LIBRIUM) capsule 25 mg  25 mg Oral Daily Verne Spurr, PA-C      . cholecalciferol (VITAMIN D) tablet 5,000 Units  5,000 Units Oral Daily Nehemiah Settle, MD   5,000 Units at 04/24/13 0818  . cloNIDine (CATAPRES)  tablet 0.1 mg  0.1 mg Oral BH-qamhs Verne Spurr, PA-C   0.1 mg at 04/24/13 4098   Followed by  . [START ON 04/25/2013] cloNIDine (CATAPRES) tablet 0.1 mg  0.1 mg Oral QAC breakfast Verne Spurr, PA-C      . dicyclomine (BENTYL) tablet 20 mg  20 mg Oral Q6H PRN Verne Spurr, PA-C      . ferrous sulfate tablet 325 mg  325 mg Oral BID WC Rachael Fee, MD   325 mg at 04/24/13 1191  . gabapentin (NEURONTIN) capsule 300 mg  300 mg Oral TID Rachael Fee, MD   300 mg at 04/24/13 1204  . hydrOXYzine (ATARAX/VISTARIL) tablet 25 mg  25 mg Oral Q6H PRN Verne Spurr, PA-C   25 mg at 04/24/13 1253  . lamoTRIgine (LAMICTAL) tablet 200 mg  200 mg Oral Daily Rachael Fee, MD   200 mg at 04/24/13 0818  . loperamide (IMODIUM) capsule 2-4 mg  2-4 mg Oral PRN Verne Spurr, PA-C   2 mg at 04/21/13 2127  . magnesium hydroxide (MILK OF MAGNESIA) suspension 30 mL  30 mL Oral Daily PRN Rachael Fee, MD   30 mL at 04/24/13 [phone]  . meloxicam (MOBIC) tablet 15 mg  15 mg Oral Daily Rachael Fee, MD   15 mg at 04/24/13 0820  . methocarbamol (ROBAXIN) tablet 500 mg  500 mg Oral Q8H PRN Verne Spurr, PA-C   500 mg at 04/24/13 0825  . multivitamin with minerals tablet 1 tablet  1 tablet Oral Daily Verne Spurr, PA-C   1 tablet at 04/24/13 9562  . nicotine (NICODERM CQ - dosed in mg/24 hours) patch 21 mg  21 mg Transdermal Daily Nehemiah Settle, MD   21 mg at 04/24/13 1308  . OLANZapine (ZYPREXA) tablet 5 mg  5 mg Oral QHS Verne Spurr, PA-C   5 mg at 04/23/13 2210  . ondansetron (ZOFRAN-ODT) disintegrating tablet 4 mg  4 mg Oral Q8H PRN Nehemiah Settle, MD   4 mg at 04/24/13 0936  . thiamine (B-1) injection 100 mg  100 mg Intramuscular Once [employer], PA-C      . thiamine (VITAMIN B-1) tablet 100 mg  100 mg Oral Daily Verne Spurr, PA-C   100 mg at 04/24/13 0818  . tiotropium (SPIRIVA) inhalation capsule 18 mcg  18 mcg Inhalation Daily Rachael Fee, MD   18 mcg at 04/24/13 [phone]  . traZODone  (DESYREL) tablet 50 mg  50 mg Oral QHS PRN,MR X 1 Rachael Fee, MD   50 mg at 04/22/13 2122    Lab Results:  No results found for this or any previous visit (from the past 48 hour(s)).  Physical Findings: AIMS: Facial and Oral Movements Muscles of Facial Expression: None, normal Lips and Perioral Area: None, normal Jaw: None, normal Tongue: None, normal,Extremity Movements Upper (arms, wrists, hands, fingers): None, normal Lower (legs, knees, ankles, toes): None, normal, Trunk Movements Neck, shoulders, hips: None, normal, Overall Severity Severity of abnormal movements (highest score from  questions above): None, normal Incapacitation due to abnormal movements: None, normal Patient's awareness of abnormal movements (rate only patient's report): No Awareness, Dental Status Current problems with teeth and/or dentures?: No Does patient usually wear dentures?: No  CIWA:  CIWA-Ar Total: 2 COWS:  COWS Total Score: 1  Treatment Plan Summary: Daily contact with patient to assess and evaluate symptoms and progress in treatment Medication management  Plan:  Continue crisis management and stabilization.  Medication management: Reviewed with patient who stated no untoward effects. Start Prozac 20 mg to help with depressive and obsessive compulsive symptoms.  Encouraged patient to attend groups and participate in group counseling sessions and activities.  Discharge plan in progress.  Continue current treatment plan.  Address health issues: Vitals reviewed and stable.   Medical Decision Making Problem Points:  Established problem, stable/improving (1) and New problem, with no additional work-up planned (3) Data Points:  Review or order clinical lab tests (1)  I certify that inpatient services furnished can reasonably be expected to improve the patient's condition.   Fransisca Kaufmann NP-C 1:54 PM 04/24/2013

## 2013-04-24 NOTE — Tx Team (Signed)
Interdisciplinary Treatment Plan Update (Adult)  Date: 04/24/2013  Time Reviewed:  9:45 AM  Progress in Treatment: Attending groups: Yes Participating in groups:  Yes Taking medication as prescribed:  Yes Tolerating medication:  Yes Family/Significant othe contact made: Yes Patient understands diagnosis:  Yes Discussing patient identified problems/goals with staff:  Yes Medical problems stabilized or resolved:  Yes Denies suicidal/homicidal ideation: Yes Issues/concerns per patient self-inventory:  Yes Other:  New problem(s) identified: N/A  Discharge Plan or Barriers: Pt has follow up scheduled at [facility] for medication management and therapy.    Reason for Continuation of Hospitalization: Anxiety Depression Medication Stabilization  Comments: N/A  Estimated length of stay: 2-3 days  For review of initial/current patient goals, please see plan of care.  Attendees: Patient:     Family:     Physician:   04/24/2013 10:13 AM   Nursing:  Quintella Reichert, RN 04/24/2013 10:13 AM   Clinical Social Worker:  Reyes Ivan, LCSWA 04/24/2013 10:13 AM   Other: Nanine Means, NP 04/24/2013 10:13 AM   Other:  Neill Loft, RN 04/24/2013 10:13 AM   Other:  Juline Patch, LCSW 04/24/2013 10:13 AM   Other:  Onnie Boer, case manager 04/24/2013 10:13 AM   Other:    Other:    Other:    Other:    Other:    Other:     Scribe for Treatment Team:   Carmina Miller, 04/24/2013 11:13 AM

## 2013-04-24 NOTE — [facility] Group Notes (Signed)
[facility] LCSW Aftercare Discharge Planning Group Note   04/24/2013 11:19 AM  Participation Quality:  Appropriate  Mood/Affect:  Appropriate and Depressed  Depression Rating:  5  Anxiety Rating:  8  Thoughts of Suicide:  No  Will you contract for safety? Yes  Current AVH:  No  Plan for Discharge/Comments:  Patient attending discharge planning group and actively participated in group.  Patient working with her CM on discharge plans.CSW provided all participants with daily workbook and information on services offered by [facility].   Transportation Means: Patient has transportation.   Supports:  Patient has a good support system.   Waylon Koffler, Joesph July

## 2013-04-24 NOTE — Progress Notes (Addendum)
Adult Psychoeducational Group Note  Date:  04/24/2013 Time:  9:09 PM  Group Topic/Focus:  Wrap-Up Group:   The focus of this group is to help patients review their daily goal of treatment and discuss progress on daily workbooks.  Participation Level:  Active  Participation Quality:  Attentive, Redirectable and Sharing  Affect:  Appropriate  Cognitive:  States hearing voices  Insight: Lacking  Engagement in Group:  Engaged, Improving and Off Topic  Modes of Intervention:  Discussion and Socialization  Additional Comments:  Pt described her day as good because she was happy two of her friends went home. She talked about detox and dealing with her anxiety. Five minutes after she shared she blurted out while others were talking,"I've just started hearing voices and I need to talk with my nurse about this". Pt was very vocal in group.  Reynolds Bowl 04/24/2013, 9:09 PM

## 2013-04-25 DIAGNOSIS — F411 Generalized anxiety disorder: Secondary | ICD-10-CM

## 2013-04-25 DIAGNOSIS — F3130 Bipolar disorder, current episode depressed, mild or moderate severity, unspecified: Secondary | ICD-10-CM

## 2013-04-25 MED ORDER — TIOTROPIUM BROMIDE MONOHYDRATE 18 MCG IN CAPS: 18.0000 ug | ORAL_CAPSULE | Freq: Every day | RESPIRATORY_TRACT | Status: DC

## 2013-04-25 MED ORDER — TRAZODONE HCL 50 MG PO TABS: 50.0000 mg | ORAL_TABLET | Freq: Every evening | ORAL | Status: DC | PRN

## 2013-04-25 MED ORDER — GABAPENTIN 300 MG PO CAPS: 300.0000 mg | ORAL_CAPSULE | Freq: Three times a day (TID) | ORAL | Status: DC

## 2013-04-25 MED ORDER — LAMOTRIGINE 200 MG PO TABS: 200.0000 mg | ORAL_TABLET | ORAL | Status: DC

## 2013-04-25 MED ORDER — MELOXICAM 15 MG PO TABS: 15.0000 mg | ORAL_TABLET | Freq: Every day | ORAL | Status: DC

## 2013-04-25 MED ORDER — POLYSACCHARIDE IRON COMPLEX 150 MG PO CAPS: 150.0000 mg | ORAL_CAPSULE | Freq: Two times a day (BID) | ORAL | Status: DC

## 2013-04-25 MED ORDER — OLANZAPINE 5 MG PO TABS: 5.0000 mg | ORAL_TABLET | Freq: Every day | ORAL | Status: DC

## 2013-04-25 MED ORDER — FLUOXETINE HCL 20 MG PO CAPS: 20.0000 mg | ORAL_CAPSULE | Freq: Every day | ORAL | Status: DC

## 2013-04-25 MED ORDER — ALBUTEROL SULFATE HFA 108 (90 BASE) MCG/ACT IN AERS: 1.0000 | INHALATION_SPRAY | Freq: Four times a day (QID) | RESPIRATORY_TRACT | Status: DC | PRN

## 2013-04-25 MED ORDER — VITAMIN D 1000 UNITS PO TABS: 5000.0000 [IU] | ORAL_TABLET | Freq: Every day | ORAL | Status: DC

## 2013-04-25 MED ORDER — ACIDOPHILUS PROBIOTIC BLEND PO CAPS: 2.0000 | ORAL_CAPSULE | Freq: Two times a day (BID) | ORAL | Status: DC

## 2013-04-25 MED ORDER — HYDROXYZINE HCL 25 MG PO TABS: 25.0000 mg | ORAL_TABLET | Freq: Four times a day (QID) | ORAL | Status: DC | PRN
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
## 2013-04-25 NOTE — Progress Notes (Signed)
[facility] Group Notes:  (Nursing/MHT/Case Management/Adjunct)  Date:  04/25/2013  Time:  9:39 AM  Type of Therapy:  Nurse Education  Participation Level:  Active  Participation Quality:  Appropriate  Affect:  Anxious  Cognitive:  Alert and Appropriate  Insight:  Good  Engagement in Group:  Engaged  Modes of Intervention:  Education  Summary of Progress/Problems:  Will work on discharge goals.  Earline Mayotte 04/25/2013, 9:39 AM
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
## 2013-04-25 NOTE — Progress Notes (Signed)
Patient ID: Shelby Hatfield, female   DOB: 05-Aug-1976, 37 y.o.   MRN: [id number] Patient discharged per physician order; patient denies SI/HI and A/V hallucinations; patient received samples, prescriptions, and copy of AVS after it was reviewed; patient had no other questions or concerns at this time; patient verbalized that she received her belongings from locker and bedside patient left the unit ambulatory

## 2013-04-25 NOTE — Progress Notes (Signed)
Patient ID: Shelby Hatfield, female   DOB: Dec 07, 1975, 37 y.o.   MRN: [id number]  D: Pt explained that she "fell asleep during group and heard voices". Stated she "woke up after answering the voices." Writer asked pt if it was possible that the voices she heard was those belonging to pt's in group. She disagreed. Then pt began to discuss how she "heard the voice of the RN over the intercom". Writer informed pt that announcements are made each morning. However, pt stated it was only 0430 and she was informed by staff that an announcement had not been made. Writer was informed by staff that pt was overheard saying that "they're not gonna let me out of here because I'm hearing voices". Stated under a lot of stress because 11 people are living in the house. Later pt informed the writer that she plans to school to get her LPN.  A:  Support and encouragement was offered. 15 min checks continued for safety.  R: Pt remains safe.

## 2013-04-25 NOTE — Progress Notes (Signed)
[facility] Adult Case Management Discharge Plan :  Will you be returning to the same living situation after discharge: Yes,  returning home At discharge, do you have transportation home?:Yes,  access to transportation Do you have the ability to pay for your medications:Yes,  access to meds  Release of information consent forms completed and in the chart;  Patient's signature needed at discharge.  Patient to Follow up at: Follow-up Information   Follow up with [facility] On 04/28/2013. (Appointment scheduled at 11:00 am with Luna Fuse for intake assessment for outpatient medication management and therapy)    Contact information:   [address] [place], [place] [address] Phone: [phone] Fax: [phone]      Patient denies SI/HI:   Yes,  denies SI/HI    Safety Planning and Suicide Prevention discussed:  Yes,  discussed with pt and pt's mother.  See suicide prevention note.   Carmina Miller 04/25/2013, 10:05 AM

## 2013-04-28 NOTE — Progress Notes (Signed)
Patient Discharge Instructions:  After Visit Summary (AVS):   Faxed to:  04/28/13 Discharge Summary Note:   Faxed to:  04/28/13 Psychiatric Admission Assessment Note:   Faxed to:  04/28/13 Suicide Risk Assessment - Discharge Assessment:   Faxed to:  04/28/13 Faxed/Sent to the Next Level Care provider:  04/28/13 Faxed to [facility] @ [phone]  Jerelene Redden, 04/28/2013, 3:01 PM

## 2013-05-15 ENCOUNTER — Emergency Department (HOSPITAL_COMMUNITY)
Admission: EM | Admit: 2013-05-15 | Discharge: 2013-05-15 | Disposition: A | Discharge status: Home / Self Care | Payer: Medicaid Other | Attending: Emergency Medicine | Admitting: Emergency Medicine

## 2013-05-15 ENCOUNTER — Encounter (HOSPITAL_COMMUNITY): Payer: Self-pay

## 2013-05-15 DIAGNOSIS — M545 Low back pain, unspecified: Secondary | ICD-10-CM | POA: Insufficient documentation

## 2013-05-15 DIAGNOSIS — Z98811 Dental restoration status: Secondary | ICD-10-CM | POA: Insufficient documentation

## 2013-05-15 DIAGNOSIS — J4489 Other specified chronic obstructive pulmonary disease: Secondary | ICD-10-CM | POA: Insufficient documentation

## 2013-05-15 DIAGNOSIS — Z880 Allergy status to penicillin: Secondary | ICD-10-CM | POA: Insufficient documentation

## 2013-05-15 DIAGNOSIS — G43909 Migraine, unspecified, not intractable, without status migrainosus: Secondary | ICD-10-CM | POA: Insufficient documentation

## 2013-05-15 DIAGNOSIS — Z8709 Personal history of other diseases of the respiratory system: Secondary | ICD-10-CM | POA: Insufficient documentation

## 2013-05-15 DIAGNOSIS — F3289 Other specified depressive episodes: Secondary | ICD-10-CM | POA: Insufficient documentation

## 2013-05-15 DIAGNOSIS — Z862 Personal history of diseases of the blood and blood-forming organs and certain disorders involving the immune mechanism: Secondary | ICD-10-CM | POA: Insufficient documentation

## 2013-05-15 DIAGNOSIS — Z79899 Other long term (current) drug therapy: Secondary | ICD-10-CM | POA: Insufficient documentation

## 2013-05-15 DIAGNOSIS — F17200 Nicotine dependence, unspecified, uncomplicated: Secondary | ICD-10-CM | POA: Insufficient documentation

## 2013-05-15 DIAGNOSIS — Z791 Long term (current) use of non-steroidal anti-inflammatories (NSAID): Secondary | ICD-10-CM | POA: Insufficient documentation

## 2013-05-15 DIAGNOSIS — F329 Major depressive disorder, single episode, unspecified: Secondary | ICD-10-CM | POA: Insufficient documentation

## 2013-05-15 DIAGNOSIS — F411 Generalized anxiety disorder: Secondary | ICD-10-CM | POA: Insufficient documentation

## 2013-05-15 DIAGNOSIS — J449 Chronic obstructive pulmonary disease, unspecified: Secondary | ICD-10-CM | POA: Insufficient documentation

## 2013-05-15 MED ORDER — CYCLOBENZAPRINE HCL 10 MG PO TABS: 10.0000 mg | ORAL_TABLET | Freq: Two times a day (BID) | ORAL | Status: DC | PRN

## 2013-05-15 MED ORDER — OXYCODONE-ACETAMINOPHEN 5-325 MG PO TABS: 1.0000 | ORAL_TABLET | Freq: Four times a day (QID) | ORAL | Status: DC | PRN

## 2013-05-15 MED ORDER — CYCLOBENZAPRINE HCL 10 MG PO TABS
10.0000 mg | ORAL_TABLET | Freq: Once | ORAL | Status: AC
  Administered 2013-05-15: 10 mg via ORAL
  Filled 2013-05-15: qty 1

## 2013-05-15 MED ORDER — OXYCODONE-ACETAMINOPHEN 5-325 MG PO TABS
1.0000 | ORAL_TABLET | Freq: Once | ORAL | Status: AC
  Administered 2013-05-15: 1 via ORAL
  Filled 2013-05-15: qty 1

## 2013-05-15 NOTE — ED Provider Notes (Signed)
CSN: [id number]     Arrival date & time 05/15/13  2227 History  This chart was scribed for non-physician practitioner, Arnoldo Hooker, PA-C working with Shelby Shi, MD by Greggory Stallion, ED scribe. This patient was seen in room WTR5/WTR5 and the patient's care was started at 10:36 PM.   Chief Complaint  Patient presents with  . Back Pain   The history is provided by the patient. No language interpreter was used.    HPI Comments: Shelby Hatfield is a 37 y.o. female with h/o back pain usually well managed on Neurontin and Mobic who presents to the Emergency Department complaining of constant burning lower right back pain that started yesterday after she went tubing. She feels the jerking and bouncing motion of the ride caused a flare of her usual condition. Pt denies abdominal pain and leg pain as associated symptoms. No urinary or bowel incontinence. No LE numbness or weakness.   Past Medical History  Diagnosis Date  . Bipolar 1 disorder   . Migraines   . Wears dentures     upper denture  . Adenotonsillar hypertrophy 03/2012    snores during sleep; denies apnea; states occ. wakes up coughing  . COPD (chronic obstructive pulmonary disease)   . Anemia   . Depression   . Anxiety    Past Surgical History  Procedure Laterality Date  . Cesarean section  08/31/2001    total of  3  . Appendectomy    . Cholecystectomy    . Tubal ligation  08/31/2001  . Dilation and evacuation  04/09/2000  . Tonsillectomy and adenoidectomy  04/12/2012    Procedure: TONSILLECTOMY AND ADENOIDECTOMY;  Surgeon: Darletta Moll, MD;  Location: [facility];  Service: ENT;  Laterality: Bilateral;  . Tonsillectomy     History reviewed. No pertinent family history. History  Substance Use Topics  . Smoking status: Current Every Day Smoker -- 1.00 packs/day    Types: Cigarettes  . Smokeless tobacco: Never Used     Comment: quit smoking 11/2011  . Alcohol Use: No   OB History   Grav Para Term  Preterm Abortions TAB SAB Ect Mult Living                 Review of Systems  HENT: Negative for neck pain.   Gastrointestinal: Negative for abdominal pain.  Genitourinary: Negative for difficulty urinating.  Musculoskeletal: Positive for back pain.  Neurological: Negative for weakness and numbness.  All other systems reviewed and are negative.    Allergies  Penicillins  Home Medications   Current Outpatient Rx  Name  Route  Sig  Dispense  Refill  . albuterol (PROVENTIL HFA;VENTOLIN HFA) 108 (90 BASE) MCG/ACT inhaler   Inhalation   Inhale 1 puff into the lungs every 6 (six) hours as needed for wheezing or shortness of breath.   1 Inhaler   0   . butalbital-acetaminophen-caffeine (FIORICET, ESGIC) 50-325-40 MG per tablet   Oral   Take 1 tablet by mouth 4 (four) times daily as needed for headache (headaches).         . cholecalciferol (VITAMIN D) 1000 UNITS tablet   Oral   Take 5 tablets (5,000 Units total) by mouth daily.   150 tablet   0   . cyclobenzaprine (FLEXERIL) 10 MG tablet   Oral   Take 10 mg by mouth 2 (two) times daily as needed for muscle spasms.         Marland Kitchen FLUoxetine (  PROZAC) 20 MG capsule   Oral   Take 1 capsule (20 mg total) by mouth daily.   30 capsule   0   . gabapentin (NEURONTIN) 300 MG capsule   Oral   Take 1 capsule (300 mg total) by mouth 3 (three) times daily.   90 capsule   0   . hydrOXYzine (ATARAX/VISTARIL) 25 MG tablet   Oral   Take 1 tablet (25 mg total) by mouth every 6 (six) hours as needed for anxiety.   30 tablet   0   . iron polysaccharides (NIFEREX) 150 MG capsule   Oral   Take 1 capsule (150 mg total) by mouth 2 (two) times daily.   60 capsule   0   . lamoTRIgine (LAMICTAL) 200 MG tablet   Oral   Take 1 tablet (200 mg total) by mouth every morning.   30 tablet   0   . meloxicam (MOBIC) 15 MG tablet   Oral   Take 1 tablet (15 mg total) by mouth daily.   30 tablet   0   . OLANZapine (ZYPREXA) 5 MG tablet    Oral   Take 1 tablet (5 mg total) by mouth at bedtime.   30 tablet   0   . Probiotic Product (ACIDOPHILUS PROBIOTIC BLEND) CAPS   Oral   Take 2 capsules by mouth 2 (two) times daily.   60 capsule   0   . rizatriptan (MAXALT-MLT) 10 MG disintegrating tablet   Oral   Take 10 mg by mouth as needed for migraine. For migraines May repeat in 2 hours if needed         . tiotropium (SPIRIVA) 18 MCG inhalation capsule   Inhalation   Place 1 capsule (18 mcg total) into inhaler and inhale daily.   30 capsule   0   . traZODone (DESYREL) 50 MG tablet   Oral   Take 1 tablet (50 mg total) by mouth at bedtime as needed and may repeat dose one time if needed for sleep.   30 tablet   0    BP 124/68  Pulse 104  Temp(Src) 98.8 F (37.1 C) (Oral)  Resp 18  Ht 5\' 2"  (1.575 m)  Wt 220 lb (99.791 kg)  BMI 40.23 kg/m2  SpO2 94%  LMP 05/12/2013  Physical Exam  Nursing note and vitals reviewed. Constitutional: She is oriented to person, place, and time. She appears well-developed and well-nourished.  HENT:  Head: Normocephalic and atraumatic.  Mouth/Throat: Oropharynx is clear and moist.  Eyes: Conjunctivae and EOM are normal. Pupils are equal, round, and reactive to light.  Neck: Normal range of motion.  Cardiovascular: Normal rate, regular rhythm, normal heart sounds and intact distal pulses.   Pulmonary/Chest: Effort normal and breath sounds normal.  Abdominal: Soft. Bowel sounds are normal.  Musculoskeletal: Normal range of motion.  No midline lumbar tenderness. Significant right para lumbar tenderness. Equal reflexes.   Neurological: She is alert and oriented to person, place, and time.  Skin: Skin is warm and dry.  Psychiatric: She has a normal mood and affect.    ED Course   Procedures (including critical care time)  DIAGNOSTIC STUDIES: Oxygen Saturation is 94% on RA, adequate by my interpretation.    COORDINATION OF CARE: 11:11 PM-Discussed treatment plan which  includes hydrocodone and a muscle relaxer with pt at bedside and pt agreed to plan. Advised pt to follow up with her orthopaedist.   Labs Reviewed - No data to  display No results found. No diagnosis found. 1. Lower back pain  MDM  Lower back pain, worse than her usual controlled condition, without neurologic deficit or symptom. Stable for discharge.       I personally performed the services described in this documentation, which was scribed in my presence. The recorded information has been reviewed and is accurate.    Arnoldo Hooker, PA-C 05/15/13 2324

## 2013-05-15 NOTE — ED Notes (Signed)
Pt has chronic back pain, yesterday she was tubing and aggrevated her back

## 2013-05-18 NOTE — ED Provider Notes (Signed)
Medical screening examination/treatment/procedure(s) were performed by non-physician practitioner and as supervising physician I was immediately available for consultation/collaboration.   Nelia Shi, MD 05/18/13 1158

## 2013-08-25 ENCOUNTER — Emergency Department (HOSPITAL_COMMUNITY)
Admission: EM | Admit: 2013-08-25 | Discharge: 2013-08-25 | Disposition: A | Discharge status: Home / Self Care | Payer: Self-pay | Attending: Emergency Medicine | Admitting: Emergency Medicine

## 2013-08-25 ENCOUNTER — Emergency Department (HOSPITAL_COMMUNITY): Payer: Self-pay

## 2013-08-25 ENCOUNTER — Encounter (HOSPITAL_COMMUNITY): Payer: Self-pay | Admitting: Emergency Medicine

## 2013-08-25 DIAGNOSIS — Z880 Allergy status to penicillin: Secondary | ICD-10-CM | POA: Insufficient documentation

## 2013-08-25 DIAGNOSIS — G8929 Other chronic pain: Secondary | ICD-10-CM | POA: Insufficient documentation

## 2013-08-25 DIAGNOSIS — IMO0002 Reserved for concepts with insufficient information to code with codable children: Secondary | ICD-10-CM | POA: Insufficient documentation

## 2013-08-25 DIAGNOSIS — M545 Low back pain, unspecified: Secondary | ICD-10-CM | POA: Insufficient documentation

## 2013-08-25 DIAGNOSIS — Y93F2 Activity, caregiving, lifting: Secondary | ICD-10-CM | POA: Insufficient documentation

## 2013-08-25 DIAGNOSIS — Y9210 Unspecified residential institution as the place of occurrence of the external cause: Secondary | ICD-10-CM | POA: Insufficient documentation

## 2013-08-25 DIAGNOSIS — W19XXXA Unspecified fall, initial encounter: Secondary | ICD-10-CM | POA: Insufficient documentation

## 2013-08-25 DIAGNOSIS — J4489 Other specified chronic obstructive pulmonary disease: Secondary | ICD-10-CM | POA: Insufficient documentation

## 2013-08-25 DIAGNOSIS — F3289 Other specified depressive episodes: Secondary | ICD-10-CM | POA: Insufficient documentation

## 2013-08-25 DIAGNOSIS — F411 Generalized anxiety disorder: Secondary | ICD-10-CM | POA: Insufficient documentation

## 2013-08-25 DIAGNOSIS — F319 Bipolar disorder, unspecified: Secondary | ICD-10-CM | POA: Insufficient documentation

## 2013-08-25 DIAGNOSIS — D649 Anemia, unspecified: Secondary | ICD-10-CM | POA: Insufficient documentation

## 2013-08-25 DIAGNOSIS — F17200 Nicotine dependence, unspecified, uncomplicated: Secondary | ICD-10-CM | POA: Insufficient documentation

## 2013-08-25 DIAGNOSIS — F329 Major depressive disorder, single episode, unspecified: Secondary | ICD-10-CM | POA: Insufficient documentation

## 2013-08-25 DIAGNOSIS — Z79899 Other long term (current) drug therapy: Secondary | ICD-10-CM | POA: Insufficient documentation

## 2013-08-25 DIAGNOSIS — R209 Unspecified disturbances of skin sensation: Secondary | ICD-10-CM | POA: Insufficient documentation

## 2013-08-25 DIAGNOSIS — J449 Chronic obstructive pulmonary disease, unspecified: Secondary | ICD-10-CM | POA: Insufficient documentation

## 2013-08-25 DIAGNOSIS — M549 Dorsalgia, unspecified: Secondary | ICD-10-CM

## 2013-08-25 MED ORDER — OXYCODONE-ACETAMINOPHEN 5-325 MG PO TABS: 2.0000 | ORAL_TABLET | ORAL | Status: DC | PRN

## 2013-08-25 MED ORDER — DIAZEPAM 5 MG PO TABS
5.0000 mg | ORAL_TABLET | Freq: Once | ORAL | Status: AC
  Administered 2013-08-25: 5 mg via ORAL
  Filled 2013-08-25: qty 1

## 2013-08-25 MED ORDER — DIAZEPAM 5 MG PO TABS: 5.0000 mg | ORAL_TABLET | Freq: Two times a day (BID) | ORAL | Status: DC

## 2013-08-25 MED ORDER — OXYCODONE-ACETAMINOPHEN 5-325 MG PO TABS
2.0000 | ORAL_TABLET | Freq: Once | ORAL | Status: AC
  Administered 2013-08-25: 2 via ORAL
  Filled 2013-08-25: qty 2

## 2013-08-25 NOTE — ED Provider Notes (Signed)
CSN: [id number]     Arrival date & time 08/25/13  1626 History   First MD Initiated Contact with Patient 08/25/13 1657    This chart was scribed for Irish Elders NP, a non-physician practitioner working with Gavin Pound. Oletta Lamas, MD by Lewanda Rife, ED Scribe. This patient was seen in room TR08C/TR08C and the patient's care was started at 5:49 PM     Chief Complaint  Patient presents with  . Back Pain   (Consider location/radiation/quality/duration/timing/severity/associated sxs/prior Treatment) The history is provided by the patient. No language interpreter was used.   HPI Comments: Shelby Hatfield is a 37 y.o. female who presents to the Emergency Department with complaining of constant moderate low back pain intermittently radiating down bilateral posterior upper legs onset yesterday while trying to "catch an elderly pt". Reports associated intermittent mild tingling sensation extending to toes. Reports pain is exacerbated with flexion and sitting. Denies any alleviating factors. Denies associated recent fall, neck pain, urinary or fecal incontinence, weakness, and fever. Pt reports PMHx of DDD and chronic low back pain.  Reports she has seen Dr. Ethelene Hal for back pain in the past.   Past Medical History  Diagnosis Date  . Bipolar 1 disorder   . Migraines   . Wears dentures     upper denture  . Adenotonsillar hypertrophy 03/2012    snores during sleep; denies apnea; states occ. wakes up coughing  . COPD (chronic obstructive pulmonary disease)   . Anemia   . Depression   . Anxiety    Past Surgical History  Procedure Laterality Date  . Cesarean section  08/31/2001    total of  3  . Appendectomy    . Cholecystectomy    . Tubal ligation  08/31/2001  . Dilation and evacuation  04/09/2000  . Tonsillectomy and adenoidectomy  04/12/2012    Procedure: TONSILLECTOMY AND ADENOIDECTOMY;  Surgeon: Darletta Moll, MD;  Location: [facility];  Service: ENT;  Laterality: Bilateral;  .  Tonsillectomy     History reviewed. No pertinent family history. History  Substance Use Topics  . Smoking status: Current Every Day Smoker -- 1.00 packs/day    Types: Cigarettes  . Smokeless tobacco: Never Used     Comment: quit smoking 11/2011  . Alcohol Use: No   OB History   Grav Para Term Preterm Abortions TAB SAB Ect Mult Living                 Review of Systems  Constitutional: Negative for fever.  Musculoskeletal: Positive for back pain.  All other systems reviewed and are negative.  A complete 10 system review of systems was obtained and all systems are negative except as noted in the HPI and PMHx.     Allergies  Penicillins  Home Medications   Current Outpatient Rx  Name  Route  Sig  Dispense  Refill  . albuterol (PROVENTIL HFA;VENTOLIN HFA) 108 (90 BASE) MCG/ACT inhaler   Inhalation   Inhale 1 puff into the lungs every 6 (six) hours as needed for wheezing or shortness of breath.   1 Inhaler   0   . butalbital-acetaminophen-caffeine (FIORICET, ESGIC) 50-325-40 MG per tablet   Oral   Take 1 tablet by mouth 4 (four) times daily as needed for headache (headaches).         . cholecalciferol (VITAMIN D) 1000 UNITS tablet   Oral   Take 5 tablets (5,000 Units total) by mouth daily.   150 tablet  0   . FLUoxetine (PROZAC) 20 MG capsule   Oral   Take 1 capsule (20 mg total) by mouth daily.   30 capsule   0   . hydrOXYzine (ATARAX/VISTARIL) 25 MG tablet   Oral   Take 1 tablet (25 mg total) by mouth every 6 (six) hours as needed for anxiety.   30 tablet   0   . ibuprofen (ADVIL,MOTRIN) 200 MG tablet   Oral   Take 800 mg by mouth every 6 (six) hours as needed for pain.         . iron polysaccharides (NIFEREX) 150 MG capsule   Oral   Take 1 capsule (150 mg total) by mouth 2 (two) times daily.   60 capsule   0   . lamoTRIgine (LAMICTAL) 200 MG tablet   Oral   Take 1 tablet (200 mg total) by mouth every morning.   30 tablet   0   . OLANZapine  (ZYPREXA) 5 MG tablet   Oral   Take 1 tablet (5 mg total) by mouth at bedtime.   30 tablet   0   . Probiotic Product (ACIDOPHILUS PROBIOTIC BLEND) CAPS   Oral   Take 2 capsules by mouth 2 (two) times daily.   60 capsule   0   . rizatriptan (MAXALT-MLT) 10 MG disintegrating tablet   Oral   Take 10 mg by mouth as needed for migraine. For migraines May repeat in 2 hours if needed         . tiotropium (SPIRIVA) 18 MCG inhalation capsule   Inhalation   Place 1 capsule (18 mcg total) into inhaler and inhale daily.   30 capsule   0   . traZODone (DESYREL) 50 MG tablet   Oral   Take 1 tablet (50 mg total) by mouth at bedtime as needed and may repeat dose one time if needed for sleep.   30 tablet   0    BP 129/78  Pulse 82  Temp(Src) 98.2 F (36.8 C) (Oral)  Resp 20  Wt 230 lb 5 oz (104.469 kg)  SpO2 99% Physical Exam  Nursing note and vitals reviewed. Constitutional: She is oriented to person, place, and time. She appears well-developed and well-nourished. No distress.  HENT:  Head: Normocephalic and atraumatic.  Eyes: EOM are normal.  Neck: Neck supple. No tracheal deviation present.  Cardiovascular: Normal rate.   Pulmonary/Chest: Effort normal. No respiratory distress.  Musculoskeletal: Normal range of motion.  No midline C-spine, T-spine, or L-spine tenderness with no step-offs or deformities noted   TTP of sacral region   Neurological: She is alert and oriented to person, place, and time.  Skin: Skin is warm and dry.  Psychiatric: She has a normal mood and affect. Her behavior is normal.    ED Course  Procedures   COORDINATION OF CARE:  Nursing notes reviewed. Vital signs reviewed. Initial pt interview and examination performed.   5:51 PM-Discussed work up plan with pt at bedside, which includes x-ray of lumbar, and x-ray of sacrum. Pt agrees with plan.  7:05 PM Nursing Notes Reviewed/ Care Coordinated Applicable Imaging Reviewed and incorporated into ED  treatment Discussed results and treatment plan with pt. Pt demonstrates understanding and agrees with plan.   Treatment plan initiated: Medications  diazepam (VALIUM) tablet 5 mg (5 mg Oral Given 08/25/13 1810)  oxyCODONE-acetaminophen (PERCOCET/ROXICET) 5-325 MG per tablet 2 tablet (2 tablets Oral Given 08/25/13 1810)     Initial diagnostic testing ordered.  Labs Review Labs Reviewed - No data to display Imaging Review Dg Lumbar Spine Complete  08/25/2013   CLINICAL DATA:  37 year old female with low back injury and pain.  EXAM: LUMBAR SPINE - COMPLETE 4+ VIEW  COMPARISON:  09/05/2012 radiographs  FINDINGS: 5 non rib-bearing lumbar type vertebra are identified in normal alignment.  There is no evidence of acute fracture or subluxation.  No focal bony lesions or spondylolysis noted.  Very mild degenerative disc disease is noted from T12-L3  IMPRESSION: No evidence of acute bony abnormality.  Very mild degenerative disc disease in the upper lumbar spine.   Electronically Signed   By: Laveda Abbe M.D.   On: 08/25/2013 18:55   Dg Sacrum/coccyx  08/25/2013   CLINICAL DATA:  Sacrum and coccyx pain following injury  EXAM: SACRUM AND COCCYX - 2+ VIEW  COMPARISON:  None.  FINDINGS: No evidence of acute fracture,subluxation or dislocation identified.  No radio-opaque foreign bodies are present.  No focal bony lesions are noted.  The joint spaces are unremarkable.  IMPRESSION: Negative.   Electronically Signed   By: Laveda Abbe M.D.   On: 08/25/2013 18:56    EKG Interpretation   None       MDM   1. Back pain     History of new fall yesterday. Negative x-rays. Probable strain. No focal deficits or weakness. Neuro exam reassuring. Ambulatory without any problems. Valium for muscle spasm and Percocet for break through pain. Follow-up with PCP if no resolution. Encouraged rest for the next couple days.  I personally performed the services described in this documentation, which was scribed in my  presence. The recorded information has been reviewed and is accurate.    Irish Elders, NP 08/25/13 1954

## 2013-08-25 NOTE — ED Provider Notes (Signed)
Medical screening examination/treatment/procedure(s) were performed by non-physician practitioner and as supervising physician I was immediately available for consultation/collaboration.  EKG Interpretation   None         Caly Pellum Y. Elanda Garmany, MD 08/25/13 2038 

## 2013-08-25 NOTE — ED Notes (Signed)
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

## 2013-09-04 ENCOUNTER — Emergency Department (HOSPITAL_COMMUNITY)
Admission: EM | Admit: 2013-09-04 | Discharge: 2013-09-05 | Disposition: A | Discharge status: Home / Self Care | Payer: Medicaid Other | Attending: Emergency Medicine | Admitting: Emergency Medicine

## 2013-09-04 ENCOUNTER — Encounter (HOSPITAL_COMMUNITY): Payer: Self-pay | Admitting: Emergency Medicine

## 2013-09-04 DIAGNOSIS — F17200 Nicotine dependence, unspecified, uncomplicated: Secondary | ICD-10-CM | POA: Insufficient documentation

## 2013-09-04 DIAGNOSIS — Z79899 Other long term (current) drug therapy: Secondary | ICD-10-CM | POA: Insufficient documentation

## 2013-09-04 DIAGNOSIS — R112 Nausea with vomiting, unspecified: Secondary | ICD-10-CM | POA: Insufficient documentation

## 2013-09-04 DIAGNOSIS — Z98811 Dental restoration status: Secondary | ICD-10-CM | POA: Insufficient documentation

## 2013-09-04 DIAGNOSIS — H53149 Visual discomfort, unspecified: Secondary | ICD-10-CM | POA: Insufficient documentation

## 2013-09-04 DIAGNOSIS — F411 Generalized anxiety disorder: Secondary | ICD-10-CM | POA: Insufficient documentation

## 2013-09-04 DIAGNOSIS — E7800 Pure hypercholesterolemia, unspecified: Secondary | ICD-10-CM | POA: Insufficient documentation

## 2013-09-04 DIAGNOSIS — J449 Chronic obstructive pulmonary disease, unspecified: Secondary | ICD-10-CM | POA: Insufficient documentation

## 2013-09-04 DIAGNOSIS — F319 Bipolar disorder, unspecified: Secondary | ICD-10-CM | POA: Insufficient documentation

## 2013-09-04 DIAGNOSIS — G43909 Migraine, unspecified, not intractable, without status migrainosus: Secondary | ICD-10-CM | POA: Insufficient documentation

## 2013-09-04 DIAGNOSIS — Z880 Allergy status to penicillin: Secondary | ICD-10-CM | POA: Insufficient documentation

## 2013-09-04 DIAGNOSIS — E669 Obesity, unspecified: Secondary | ICD-10-CM | POA: Insufficient documentation

## 2013-09-04 DIAGNOSIS — D649 Anemia, unspecified: Secondary | ICD-10-CM | POA: Insufficient documentation

## 2013-09-04 DIAGNOSIS — Z8669 Personal history of other diseases of the nervous system and sense organs: Secondary | ICD-10-CM | POA: Insufficient documentation

## 2013-09-04 DIAGNOSIS — J4489 Other specified chronic obstructive pulmonary disease: Secondary | ICD-10-CM | POA: Insufficient documentation

## 2013-09-04 HISTORY — DX: Obesity, unspecified: E66.9

## 2013-09-04 HISTORY — DX: Pure hypercholesterolemia, unspecified: E78.00

## 2013-09-04 NOTE — ED Notes (Signed)
Pt. reports migraine headache for 3 days with nausea , vomitting and photophobia unrelieved by prescription medications .

## 2013-09-05 MED ORDER — METOCLOPRAMIDE HCL 5 MG/ML IJ SOLN
10.0000 mg | Freq: Once | INTRAMUSCULAR | Status: AC
  Administered 2013-09-05: 10 mg via INTRAVENOUS
  Filled 2013-09-05: qty 2

## 2013-09-05 MED ORDER — SODIUM CHLORIDE 0.9 % IV SOLN: Freq: Once | INTRAVENOUS | Status: DC

## 2013-09-05 MED ORDER — DIPHENHYDRAMINE HCL 50 MG/ML IJ SOLN
25.0000 mg | Freq: Once | INTRAMUSCULAR | Status: AC
  Administered 2013-09-05: 25 mg via INTRAVENOUS
  Filled 2013-09-05: qty 1

## 2013-09-05 MED ORDER — DEXAMETHASONE SODIUM PHOSPHATE 10 MG/ML IJ SOLN
10.0000 mg | Freq: Once | INTRAMUSCULAR | Status: AC
  Administered 2013-09-05: 10 mg via INTRAVENOUS
  Filled 2013-09-05: qty 1

## 2013-09-05 MED ORDER — HYDROMORPHONE HCL PF 1 MG/ML IJ SOLN
1.0000 mg | Freq: Once | INTRAMUSCULAR | Status: AC
  Administered 2013-09-05: 1 mg via INTRAVENOUS
  Filled 2013-09-05: qty 1

## 2013-09-05 MED ORDER — LORAZEPAM 2 MG/ML IJ SOLN
1.0000 mg | INTRAMUSCULAR | Status: AC | PRN
  Administered 2013-09-05: 1 mg via INTRAVENOUS
  Filled 2013-09-05: qty 1

## 2013-09-05 MED ORDER — KETOROLAC TROMETHAMINE 30 MG/ML IJ SOLN
30.0000 mg | Freq: Once | INTRAMUSCULAR | Status: AC
  Administered 2013-09-05: 30 mg via INTRAVENOUS
  Filled 2013-09-05: qty 1

## 2013-09-05 MED ORDER — SODIUM CHLORIDE 0.9 % IV SOLN
Freq: Once | INTRAVENOUS | Status: AC
  Administered 2013-09-05: 1000 mL via INTRAVENOUS

## 2013-09-05 MED ORDER — MAGNESIUM SULFATE IN D5W 10-5 MG/ML-% IV SOLN
1.0000 g | Freq: Once | INTRAVENOUS | Status: AC
  Administered 2013-09-05: 1 g via INTRAVENOUS
  Filled 2013-09-05: qty 100

## 2013-09-05 NOTE — ED Notes (Signed)
Pt reports pain still 5/10, does not feel her pain will get any better.  Ready to go home.

## 2013-09-05 NOTE — ED Notes (Signed)
Pt experiencing periods of O2 sat dropping to 87-88 on room air.  O2 placed at 2 L via .

## 2013-09-05 NOTE — ED Notes (Signed)
Reports headache improved but still rates pain 7/10.  Biggest complaint is restless legs.

## 2013-09-05 NOTE — ED Notes (Signed)
Reports migraines x 3 days not responding to usual pain meds.  Nausea accompanying.  Recent cold symptoms, thought headache was just due to illness.

## 2013-09-05 NOTE — ED Notes (Signed)
Pt up moving around room, c/o restlessness and "my headache is no better"  Dr Norlene Campbell notified; order obtained.

## 2013-09-05 NOTE — ED Provider Notes (Signed)
CSN: [id number]     Arrival date & time 09/04/13  2139 History   First MD Initiated Contact with Patient 09/04/13 2354     Chief Complaint  Patient presents with  . Migraine   (Consider location/radiation/quality/duration/timing/severity/associated sxs/prior Treatment) HPI 37 year old female presents to emergency room with complaint of headache.  She reports 3 days of her typical migraine with photophobia, nausea, vomiting.  She has taken her Fioricet and Maxalt without improvement.  Patient reports this headache is one of her worst ones, but is not worst headache ever.  She has had similar very bad headaches in the past.  Headache was not with maximal in onset.  It is similar in nature and location to her prior migraines.  No weakness no numbness.  No focal neurologic changes. Past Medical History  Diagnosis Date  . Bipolar 1 disorder   . Migraines   . Wears dentures     upper denture  . Adenotonsillar hypertrophy 03/2012    snores during sleep; denies apnea; states occ. wakes up coughing  . COPD (chronic obstructive pulmonary disease)   . Anemia   . Depression   . Anxiety   . Obesity   . Hypercholesterolemia    Past Surgical History  Procedure Laterality Date  . Cesarean section  08/31/2001    total of  3  . Appendectomy    . Cholecystectomy    . Tubal ligation  08/31/2001  . Dilation and evacuation  04/09/2000  . Tonsillectomy and adenoidectomy  04/12/2012    Procedure: TONSILLECTOMY AND ADENOIDECTOMY;  Surgeon: Darletta Moll, MD;  Location: [facility];  Service: ENT;  Laterality: Bilateral;  . Tonsillectomy     No family history on file. History  Substance Use Topics  . Smoking status: Current Every Day Smoker -- 1.00 packs/day    Types: Cigarettes  . Smokeless tobacco: Never Used     Comment: quit smoking 11/2011  . Alcohol Use: No   OB History   Grav Para Term Preterm Abortions TAB SAB Ect Mult Living                 Review of Systems  All other systems  reviewed and are negative.    Allergies  Penicillins  Home Medications   Current Outpatient Rx  Name  Route  Sig  Dispense  Refill  . albuterol (PROVENTIL HFA;VENTOLIN HFA) 108 (90 BASE) MCG/ACT inhaler   Inhalation   Inhale 1 puff into the lungs every 6 (six) hours as needed for wheezing or shortness of breath.   1 Inhaler   0   . ASA-APAP-Caff Buffered (VANQUISH PO)   Oral   Take 2 tablets by mouth daily as needed (pain).         . butalbital-acetaminophen-caffeine (FIORICET, ESGIC) 50-325-40 MG per tablet   Oral   Take 1 tablet by mouth 4 (four) times daily as needed for headache (headaches).         . cholecalciferol (VITAMIN D) 1000 UNITS tablet   Oral   Take 5 tablets (5,000 Units total) by mouth daily.   150 tablet   0   . diazepam (VALIUM) 5 MG tablet   Oral   Take 1 tablet (5 mg total) by mouth 2 (two) times daily.   10 tablet   0   . FLUoxetine (PROZAC) 20 MG capsule   Oral   Take 1 capsule (20 mg total) by mouth daily.   30 capsule   0   .  hydrOXYzine (ATARAX/VISTARIL) 25 MG tablet   Oral   Take 1 tablet (25 mg total) by mouth every 6 (six) hours as needed for anxiety.   30 tablet   0   . iron polysaccharides (NIFEREX) 150 MG capsule   Oral   Take 1 capsule (150 mg total) by mouth 2 (two) times daily.   60 capsule   0   . lamoTRIgine (LAMICTAL) 200 MG tablet   Oral   Take 1 tablet (200 mg total) by mouth every morning.   30 tablet   0   . OLANZapine (ZYPREXA) 5 MG tablet   Oral   Take 1 tablet (5 mg total) by mouth at bedtime.   30 tablet   0   . Probiotic Product (ACIDOPHILUS PROBIOTIC BLEND) CAPS   Oral   Take 2 capsules by mouth 2 (two) times daily.   60 capsule   0   . rizatriptan (MAXALT-MLT) 10 MG disintegrating tablet   Oral   Take 10 mg by mouth as needed for migraine. For migraines May repeat in 2 hours if needed         . tiotropium (SPIRIVA) 18 MCG inhalation capsule   Inhalation   Place 1 capsule (18 mcg  total) into inhaler and inhale daily.   30 capsule   0   . traZODone (DESYREL) 50 MG tablet   Oral   Take 1 tablet (50 mg total) by mouth at bedtime as needed and may repeat dose one time if needed for sleep.   30 tablet   0    BP 122/89  Pulse 88  Temp(Src) 98.3 F (36.8 C) (Oral)  Resp 18  Ht 5\' 2"  (1.575 m)  Wt 234 lb (106.142 kg)  BMI 42.79 kg/m2  SpO2 96%  LMP 08/27/2013 Physical Exam  Nursing note and vitals reviewed. Constitutional: She is oriented to person, place, and time. She appears well-developed and well-nourished. She appears distressed (uncomfortable appearing).  HENT:  Head: Normocephalic and atraumatic.  Right Ear: External ear normal.  Left Ear: External ear normal.  Nose: Nose normal.  Mouth/Throat: Oropharynx is clear and moist.  Eyes: Conjunctivae and EOM are normal. Pupils are equal, round, and reactive to light.  Photophobia noted  Neck: Normal range of motion. Neck supple. No JVD present. No tracheal deviation present. No thyromegaly present.  Cardiovascular: Normal rate, regular rhythm, normal heart sounds and intact distal pulses.  Exam reveals no gallop and no friction rub.   No murmur heard. Pulmonary/Chest: Effort normal and breath sounds normal. No stridor. No respiratory distress. She has no wheezes. She has no rales. She exhibits no tenderness.  Abdominal: Soft. Bowel sounds are normal. She exhibits no distension and no mass. There is no tenderness. There is no rebound and no guarding.  Musculoskeletal: Normal range of motion. She exhibits no edema and no tenderness.  Lymphadenopathy:    She has no cervical adenopathy.  Neurological: She is alert and oriented to person, place, and time. She has normal reflexes. No cranial nerve deficit. She exhibits normal muscle tone. Coordination normal.  Skin: Skin is warm and dry. No rash noted. No erythema. No pallor.  Psychiatric: She has a normal mood and affect. Her behavior is normal. Judgment and  thought content normal.    ED Course  Procedures (including critical care time) Labs Review Labs Reviewed - No data to display Imaging Review No results found.  EKG Interpretation   None       MDM  1. Migraine    37 year old female with migraine.  We'll treat with headache cocktail, and reassess.  Patient reports persistent headache despite cocktail.  She is having some restless legs.  Has had history of same in the past.  Each evening.  Will try magnesium and will do one dose of dilaudid for pain   Olivia Mackie, MD 09/05/13 [phone]

## 2013-09-05 NOTE — ED Notes (Signed)
Pt feels headache is beginning to improve but restless legs remain. Pt takes seroquel at home for same.

## 2013-10-20 ENCOUNTER — Encounter (HOSPITAL_COMMUNITY): Payer: Self-pay | Admitting: Emergency Medicine

## 2013-10-20 ENCOUNTER — Emergency Department (HOSPITAL_COMMUNITY)
Admission: EM | Admit: 2013-10-20 | Discharge: 2013-10-20 | Disposition: A | Discharge status: Home / Self Care | Payer: Medicaid Other | Attending: Emergency Medicine | Admitting: Emergency Medicine

## 2013-10-20 DIAGNOSIS — F319 Bipolar disorder, unspecified: Secondary | ICD-10-CM | POA: Insufficient documentation

## 2013-10-20 DIAGNOSIS — J449 Chronic obstructive pulmonary disease, unspecified: Secondary | ICD-10-CM | POA: Insufficient documentation

## 2013-10-20 DIAGNOSIS — J029 Acute pharyngitis, unspecified: Secondary | ICD-10-CM | POA: Insufficient documentation

## 2013-10-20 DIAGNOSIS — G43909 Migraine, unspecified, not intractable, without status migrainosus: Secondary | ICD-10-CM | POA: Insufficient documentation

## 2013-10-20 DIAGNOSIS — Z862 Personal history of diseases of the blood and blood-forming organs and certain disorders involving the immune mechanism: Secondary | ICD-10-CM | POA: Insufficient documentation

## 2013-10-20 DIAGNOSIS — J111 Influenza due to unidentified influenza virus with other respiratory manifestations: Secondary | ICD-10-CM | POA: Insufficient documentation

## 2013-10-20 DIAGNOSIS — Z880 Allergy status to penicillin: Secondary | ICD-10-CM | POA: Insufficient documentation

## 2013-10-20 DIAGNOSIS — Z98811 Dental restoration status: Secondary | ICD-10-CM | POA: Insufficient documentation

## 2013-10-20 DIAGNOSIS — F411 Generalized anxiety disorder: Secondary | ICD-10-CM | POA: Insufficient documentation

## 2013-10-20 DIAGNOSIS — J4489 Other specified chronic obstructive pulmonary disease: Secondary | ICD-10-CM | POA: Insufficient documentation

## 2013-10-20 DIAGNOSIS — Z79899 Other long term (current) drug therapy: Secondary | ICD-10-CM | POA: Insufficient documentation

## 2013-10-20 DIAGNOSIS — Z9089 Acquired absence of other organs: Secondary | ICD-10-CM | POA: Insufficient documentation

## 2013-10-20 DIAGNOSIS — E669 Obesity, unspecified: Secondary | ICD-10-CM | POA: Insufficient documentation

## 2013-10-20 DIAGNOSIS — F17200 Nicotine dependence, unspecified, uncomplicated: Secondary | ICD-10-CM | POA: Insufficient documentation

## 2013-10-20 DIAGNOSIS — R6889 Other general symptoms and signs: Secondary | ICD-10-CM

## 2013-10-20 MED ORDER — HYDROCOD POLST-CHLORPHEN POLST 10-8 MG/5ML PO LQCR: 5.0000 mL | Freq: Two times a day (BID) | ORAL | Status: DC | PRN

## 2013-10-20 MED ORDER — ONDANSETRON 4 MG PO TBDP: 4.0000 mg | ORAL_TABLET | Freq: Three times a day (TID) | ORAL | Status: DC | PRN

## 2013-10-20 NOTE — ED Provider Notes (Signed)
CSN: [id number]     Arrival date & time 10/20/13  4098 History   First MD Initiated Contact with Patient 10/20/13 [phone]     Chief Complaint  Patient presents with  . flu-like symptoms    (Consider location/radiation/quality/duration/timing/severity/associated sxs/prior Treatment) The history is provided by the patient and medical records.   This is a 38 year old female past medical history significant for bipolar disorder, COPD, depression, anxiety, hyperlipidemia, presenting to the ED for flulike symptoms for the past 3 days. If the patient has had a dry cough, or loss body aches, sore throat, ear pain, and intermittent headaches. Patient denies recent known sick contacts. Patient did not receive a flu shot this year. She denies fever or chills.  Admits to some nausea and vomiting just today, but none today. Emesis was nonbloody and nonbilious.  Denies abdominal pain, chest pain, SOB, palpitations, dizziness, or weakness. VS stable arrival.  Past Medical History  Diagnosis Date  . Bipolar 1 disorder   . Migraines   . Wears dentures     upper denture  . Adenotonsillar hypertrophy 03/2012    snores during sleep; denies apnea; states occ. wakes up coughing  . COPD (chronic obstructive pulmonary disease)   . Anemia   . Depression   . Anxiety   . Obesity   . Hypercholesterolemia    Past Surgical History  Procedure Laterality Date  . Cesarean section  08/31/2001    total of  3  . Appendectomy    . Cholecystectomy    . Tubal ligation  08/31/2001  . Dilation and evacuation  04/09/2000  . Tonsillectomy and adenoidectomy  04/12/2012    Procedure: TONSILLECTOMY AND ADENOIDECTOMY;  Surgeon: Darletta Moll, MD;  Location: [facility];  Service: ENT;  Laterality: Bilateral;  . Tonsillectomy     No family history on file. History  Substance Use Topics  . Smoking status: Current Every Day Smoker -- 1.00 packs/day    Types: Cigarettes  . Smokeless tobacco: Never Used     Comment:  quit smoking 11/2011  . Alcohol Use: No   OB History   Grav Para Term Preterm Abortions TAB SAB Ect Mult Living                 Review of Systems  Constitutional: Negative for fever and chills.  HENT: Positive for congestion, postnasal drip, rhinorrhea and sore throat.   Respiratory: Positive for cough.   Musculoskeletal: Positive for myalgias.  All other systems reviewed and are negative.    Allergies  Penicillins  Home Medications   Current Outpatient Rx  Name  Route  Sig  Dispense  Refill  . albuterol (PROVENTIL HFA;VENTOLIN HFA) 108 (90 BASE) MCG/ACT inhaler   Inhalation   Inhale 1 puff into the lungs every 6 (six) hours as needed for wheezing or shortness of breath.   1 Inhaler   0   . ASA-APAP-Caff Buffered (VANQUISH PO)   Oral   Take 2 tablets by mouth daily as needed (pain).         . butalbital-acetaminophen-caffeine (FIORICET, ESGIC) 50-325-40 MG per tablet   Oral   Take 1 tablet by mouth 4 (four) times daily as needed for headache (headaches).         . cholecalciferol (VITAMIN D) 1000 UNITS tablet   Oral   Take 5 tablets (5,000 Units total) by mouth daily.   150 tablet   0   . diazepam (VALIUM) 5 MG tablet   Oral  Take 1 tablet (5 mg total) by mouth 2 (two) times daily.   10 tablet   0   . FLUoxetine (PROZAC) 20 MG capsule   Oral   Take 1 capsule (20 mg total) by mouth daily.   30 capsule   0   . hydrOXYzine (ATARAX/VISTARIL) 25 MG tablet   Oral   Take 1 tablet (25 mg total) by mouth every 6 (six) hours as needed for anxiety.   30 tablet   0   . iron polysaccharides (NIFEREX) 150 MG capsule   Oral   Take 1 capsule (150 mg total) by mouth 2 (two) times daily.   60 capsule   0   . lamoTRIgine (LAMICTAL) 200 MG tablet   Oral   Take 1 tablet (200 mg total) by mouth every morning.   30 tablet   0   . OLANZapine (ZYPREXA) 5 MG tablet   Oral   Take 1 tablet (5 mg total) by mouth at bedtime.   30 tablet   0   . Probiotic Product  (ACIDOPHILUS PROBIOTIC BLEND) CAPS   Oral   Take 2 capsules by mouth 2 (two) times daily.   60 capsule   0   . rizatriptan (MAXALT-MLT) 10 MG disintegrating tablet   Oral   Take 10 mg by mouth as needed for migraine. For migraines May repeat in 2 hours if needed         . tiotropium (SPIRIVA) 18 MCG inhalation capsule   Inhalation   Place 1 capsule (18 mcg total) into inhaler and inhale daily.   30 capsule   0   . traZODone (DESYREL) 50 MG tablet   Oral   Take 1 tablet (50 mg total) by mouth at bedtime as needed and may repeat dose one time if needed for sleep.   30 tablet   0    BP 124/68  Pulse 91  Temp(Src) 99.5 F (37.5 C) (Oral)  Resp 20  Ht 5\' 1"  (1.549 m)  Wt 220 lb (99.791 kg)  BMI 41.59 kg/m2  SpO2 95%  LMP 10/18/2013  Physical Exam  Nursing note and vitals reviewed. Constitutional: She is oriented to person, place, and time. She appears well-developed and well-nourished. No distress.  HENT:  Head: Normocephalic and atraumatic.  Right Ear: Tympanic membrane and ear canal normal.  Left Ear: Tympanic membrane and ear canal normal.  Nose: Mucosal edema present.  Mouth/Throat: Uvula is midline, oropharynx is clear and moist and mucous membranes are normal. No oropharyngeal exudate, posterior oropharyngeal edema, posterior oropharyngeal erythema or tonsillar abscesses.  Turbinate swollen and erythematous, postnasal drip noted in oropharynx  Eyes: Conjunctivae and EOM are normal. Pupils are equal, round, and reactive to light.  Neck: Normal range of motion. Neck supple.  Cardiovascular: Normal rate, regular rhythm and normal heart sounds.   Pulmonary/Chest: Effort normal and breath sounds normal. No respiratory distress. She has no wheezes. She has no rhonchi. She has no rales.  Musculoskeletal: Normal range of motion. She exhibits no edema.  Neurological: She is alert and oriented to person, place, and time.  Skin: Skin is warm and dry. She is not diaphoretic.   Psychiatric: She has a normal mood and affect.    ED Course  Procedures (including critical care time) Labs Review Labs Reviewed - No data to display Imaging Review No results found.  EKG Interpretation   None       MDM   1. Flu-like symptoms    Signs and  symptoms consistent with flu. Pt afebrile, non-toxic appearing, NAD, VS stable- ok for discharge.  Patient is out of the window for Tamiflu. She will be started on Tussionex and zofran for sx control. Encouraged to drink clear fluids to keep herself hydrated. May restart bland diet progress as tolerated. She will follow with her primary care physician if problems occur. Discussed plan with patient and she agreed. Return precautions and  Garlon Hatchet, PA-C 10/20/13 1018

## 2013-10-20 NOTE — ED Provider Notes (Signed)
Medical screening examination/treatment/procedure(s) were performed by non-physician practitioner and as supervising physician I was immediately available for consultation/collaboration.  EKG Interpretation   None         Glynn OctaveStephen Kellene Mccleary, MD 10/20/13 [phone]

## 2013-10-20 NOTE — ED Notes (Signed)
Pt feeling cold-like s/s x 3 days.  Yesterday c/o neck pain, back pain, headache and body aches since yesterday.  VS stable.

## 2013-10-20 NOTE — Discharge Instructions (Signed)
Take the prescribed medication as directed.  Drink plenty of fluids to keep yourself hydrated and rest as much as possible. Follow-up with your primary care physician as needed. Return to the ED for new or worsening symptoms.

## 2013-10-25 ENCOUNTER — Encounter (HOSPITAL_COMMUNITY): Payer: Self-pay | Admitting: Emergency Medicine

## 2013-10-25 ENCOUNTER — Emergency Department (HOSPITAL_COMMUNITY)
Admission: EM | Admit: 2013-10-25 | Discharge: 2013-10-25 | Disposition: A | Discharge status: Home / Self Care | Payer: Self-pay | Attending: Emergency Medicine | Admitting: Emergency Medicine

## 2013-10-25 ENCOUNTER — Emergency Department (HOSPITAL_COMMUNITY): Payer: Self-pay

## 2013-10-25 DIAGNOSIS — R05 Cough: Secondary | ICD-10-CM

## 2013-10-25 DIAGNOSIS — Z98811 Dental restoration status: Secondary | ICD-10-CM | POA: Insufficient documentation

## 2013-10-25 DIAGNOSIS — R197 Diarrhea, unspecified: Secondary | ICD-10-CM | POA: Insufficient documentation

## 2013-10-25 DIAGNOSIS — J441 Chronic obstructive pulmonary disease with (acute) exacerbation: Secondary | ICD-10-CM | POA: Insufficient documentation

## 2013-10-25 DIAGNOSIS — R112 Nausea with vomiting, unspecified: Secondary | ICD-10-CM | POA: Insufficient documentation

## 2013-10-25 DIAGNOSIS — Z9089 Acquired absence of other organs: Secondary | ICD-10-CM | POA: Insufficient documentation

## 2013-10-25 DIAGNOSIS — R059 Cough, unspecified: Secondary | ICD-10-CM

## 2013-10-25 DIAGNOSIS — F17200 Nicotine dependence, unspecified, uncomplicated: Secondary | ICD-10-CM | POA: Insufficient documentation

## 2013-10-25 DIAGNOSIS — F411 Generalized anxiety disorder: Secondary | ICD-10-CM | POA: Insufficient documentation

## 2013-10-25 DIAGNOSIS — Z880 Allergy status to penicillin: Secondary | ICD-10-CM | POA: Insufficient documentation

## 2013-10-25 DIAGNOSIS — F319 Bipolar disorder, unspecified: Secondary | ICD-10-CM | POA: Insufficient documentation

## 2013-10-25 DIAGNOSIS — Z862 Personal history of diseases of the blood and blood-forming organs and certain disorders involving the immune mechanism: Secondary | ICD-10-CM | POA: Insufficient documentation

## 2013-10-25 DIAGNOSIS — Z79899 Other long term (current) drug therapy: Secondary | ICD-10-CM | POA: Insufficient documentation

## 2013-10-25 DIAGNOSIS — E669 Obesity, unspecified: Secondary | ICD-10-CM | POA: Insufficient documentation

## 2013-10-25 DIAGNOSIS — B9789 Other viral agents as the cause of diseases classified elsewhere: Secondary | ICD-10-CM | POA: Insufficient documentation

## 2013-10-25 DIAGNOSIS — B349 Viral infection, unspecified: Secondary | ICD-10-CM

## 2013-10-25 DIAGNOSIS — G43909 Migraine, unspecified, not intractable, without status migrainosus: Secondary | ICD-10-CM | POA: Insufficient documentation

## 2013-10-25 LAB — COMPREHENSIVE METABOLIC PANEL
ALT: 12 U/L (ref 0–35)
AST: 12 U/L (ref 0–37)
Albumin: 3.3 g/dL — ABNORMAL LOW (ref 3.5–5.2)
Alkaline Phosphatase: 76 U/L (ref 39–117)
BUN: 7 mg/dL (ref 6–23)
CALCIUM: 8.6 mg/dL (ref 8.4–10.5)
CO2: 23 mEq/L (ref 19–32)
CREATININE: 0.74 mg/dL (ref 0.50–1.10)
Chloride: 103 mEq/L (ref 96–112)
GFR calc Af Amer: >90 mL/min (ref >90)
GFR calc non Af Amer: >90 mL/min (ref >90)
Glucose, Bld: 109 mg/dL — ABNORMAL HIGH (ref 70–99)
Potassium: 3.9 mEq/L (ref 3.7–5.3)
SODIUM: 139 meq/L (ref 137–147)
TOTAL PROTEIN: 6.5 g/dL (ref 6.0–8.3)
Total Bilirubin: <0.2 mg/dL — ABNORMAL LOW (ref 0.3–1.2)

## 2013-10-25 LAB — CBC WITH DIFFERENTIAL/PLATELET
BASOS ABS: 0 10*3/uL (ref 0.0–0.1)
BASOS PCT: 0 % (ref 0–1)
EOS ABS: 0.1 10*3/uL (ref 0.0–0.7)
EOS PCT: 2 % (ref 0–5)
HCT: 34.7 % — ABNORMAL LOW (ref 36.0–46.0)
Hemoglobin: 11.2 g/dL — ABNORMAL LOW (ref 12.0–15.0)
Lymphocytes Relative: 34 % (ref 12–46)
Lymphs Abs: 3 10*3/uL (ref 0.7–4.0)
MCH: 26 pg (ref 26.0–34.0)
MCHC: 32.3 g/dL (ref 30.0–36.0)
MCV: 80.5 fL (ref 78.0–100.0)
MONO ABS: 0.4 10*3/uL (ref 0.1–1.0)
Monocytes Relative: 5 % (ref 3–12)
Neutro Abs: 5.2 10*3/uL (ref 1.7–7.7)
Neutrophils Relative %: 60 % (ref 43–77)
PLATELETS: 315 10*3/uL (ref 150–400)
RBC: 4.31 MIL/uL (ref 3.87–5.11)
RDW: 15 % (ref 11.5–15.5)
WBC: 8.8 10*3/uL (ref 4.0–10.5)

## 2013-10-25 LAB — CK: Total CK: 75 U/L (ref 7–177)

## 2013-10-25 LAB — CG4 I-STAT (LACTIC ACID): Lactic Acid, Venous: 1.32 mmol/L (ref 0.5–2.2)

## 2013-10-25 MED ORDER — IBUPROFEN 800 MG PO TABS
800.0000 mg | ORAL_TABLET | Freq: Once | ORAL | Status: AC
  Administered 2013-10-25: 800 mg via ORAL
  Filled 2013-10-25: qty 1

## 2013-10-25 MED ORDER — ONDANSETRON 4 MG PO TBDP
8.0000 mg | ORAL_TABLET | Freq: Once | ORAL | Status: AC
  Administered 2013-10-25: 8 mg via ORAL
  Filled 2013-10-25: qty 2

## 2013-10-25 MED ORDER — OXYCODONE-ACETAMINOPHEN 5-325 MG PO TABS
2.0000 | ORAL_TABLET | Freq: Once | ORAL | Status: AC
  Administered 2013-10-25: 2 via ORAL
  Filled 2013-10-25: qty 2

## 2013-10-25 MED ORDER — MORPHINE SULFATE 4 MG/ML IJ SOLN
4.0000 mg | Freq: Once | INTRAMUSCULAR | Status: AC
  Administered 2013-10-25: 4 mg via INTRAVENOUS
  Filled 2013-10-25: qty 1

## 2013-10-25 MED ORDER — IPRATROPIUM BROMIDE 0.02 % IN SOLN
0.5000 mg | Freq: Once | RESPIRATORY_TRACT | Status: AC
Start: 2013-10-25 — End: 2013-10-25
  Administered 2013-10-25: 0.5 mg via RESPIRATORY_TRACT
  Filled 2013-10-25: qty 2.5

## 2013-10-25 MED ORDER — SODIUM CHLORIDE 0.9 % IV BOLUS (SEPSIS)
1000.0000 mL | Freq: Once | INTRAVENOUS | Status: AC
  Administered 2013-10-25: 1000 mL via INTRAVENOUS

## 2013-10-25 MED ORDER — TRAMADOL HCL 50 MG PO TABS
50.0000 mg | ORAL_TABLET | Freq: Once | ORAL | Status: AC
  Administered 2013-10-25: 50 mg via ORAL
  Filled 2013-10-25: qty 1

## 2013-10-25 MED ORDER — PREDNISONE 20 MG PO TABS: 40.0000 mg | ORAL_TABLET | Freq: Every day | ORAL | Status: DC

## 2013-10-25 MED ORDER — ALBUTEROL SULFATE (2.5 MG/3ML) 0.083% IN NEBU
5.0000 mg | INHALATION_SOLUTION | Freq: Once | RESPIRATORY_TRACT | Status: AC
  Administered 2013-10-25: 5 mg via RESPIRATORY_TRACT
  Filled 2013-10-25: qty 6

## 2013-10-25 NOTE — ED Notes (Signed)
Lactic acid results shown to H. Merrell, PA-C 

## 2013-10-25 NOTE — ED Provider Notes (Signed)
CSN: [id number]     Arrival date & time 10/25/13  0759 History   First MD Initiated Contact with Patient 10/25/13 0801     Chief Complaint  Patient presents with  . Cough   (Consider location/radiation/quality/duration/timing/severity/associated sxs/prior Treatment) HPI Comments: Patient is a 38 year old female with history of bipolar 1 disorder, COPD, obesity who presents today with 8 days of flulike symptoms. She reports she has been seen in the emergency department 5 days ago and was diagnosed with the flu. She has had generalized body aches, throbbing frontal headache, fever, chills, nausea, vomiting, diarrhea since last Tuesday. Her cough has been gradually worsening. It is becoming productive. She reports that she is coughing up a "rubberlike yellow sputum". The cough is worse at night. She has taken Tussionex without relief of her symptoms. Her headache has been gradually worsening over the past 8 days.   Patient is a 38 y.o. female presenting with cough. The history is provided by the patient. No language interpreter was used.  Cough Associated symptoms: chills, fever, myalgias and shortness of breath   Associated symptoms: no ear pain     Past Medical History  Diagnosis Date  . Bipolar 1 disorder   . Migraines   . Wears dentures     upper denture  . Adenotonsillar hypertrophy 03/2012    snores during sleep; denies apnea; states occ. wakes up coughing  . COPD (chronic obstructive pulmonary disease)   . Anemia   . Depression   . Anxiety   . Obesity   . Hypercholesterolemia    Past Surgical History  Procedure Laterality Date  . Cesarean section  08/31/2001    total of  3  . Appendectomy    . Cholecystectomy    . Tubal ligation  08/31/2001  . Dilation and evacuation  04/09/2000  . Tonsillectomy and adenoidectomy  04/12/2012    Procedure: TONSILLECTOMY AND ADENOIDECTOMY;  Surgeon: Darletta Moll, MD;  Location: [facility];  Service: ENT;  Laterality: Bilateral;  .  Tonsillectomy     No family history on file. History  Substance Use Topics  . Smoking status: Current Every Day Smoker -- 0.33 packs/day    Types: Cigarettes  . Smokeless tobacco: Never Used     Comment: quit smoking 11/2011  . Alcohol Use: No   OB History   Grav Para Term Preterm Abortions TAB SAB Ect Mult Living                 Review of Systems  Constitutional: Positive for fever and chills.  HENT: Positive for congestion. Negative for ear pain.   Respiratory: Positive for cough and shortness of breath.   Cardiovascular: Negative for palpitations.  Gastrointestinal: Positive for nausea, vomiting and diarrhea. Negative for abdominal pain.  Musculoskeletal: Positive for myalgias.  All other systems reviewed and are negative.    Allergies  Penicillins  Home Medications   Current Outpatient Rx  Name  Route  Sig  Dispense  Refill  . albuterol (PROVENTIL HFA;VENTOLIN HFA) 108 (90 BASE) MCG/ACT inhaler   Inhalation   Inhale 1 puff into the lungs every 6 (six) hours as needed for wheezing or shortness of breath.   1 Inhaler   0   . ASA-APAP-Caff Buffered (VANQUISH PO)   Oral   Take 2 tablets by mouth daily as needed (pain).         . butalbital-acetaminophen-caffeine (FIORICET, ESGIC) 50-325-40 MG per tablet   Oral   Take 1 tablet by  mouth 4 (four) times daily as needed for headache (headaches).         . chlorpheniramine-HYDROcodone (TUSSIONEX PENNKINETIC ER) 10-8 MG/5ML LQCR   Oral   Take 5 mLs by mouth every 12 (twelve) hours as needed for cough (Cough).   115 mL   0   . cholecalciferol (VITAMIN D) 1000 UNITS tablet   Oral   Take 5 tablets (5,000 Units total) by mouth daily.   150 tablet   0   . diazepam (VALIUM) 5 MG tablet   Oral   Take 1 tablet (5 mg total) by mouth 2 (two) times daily.   10 tablet   0   . FLUoxetine (PROZAC) 20 MG capsule   Oral   Take 1 capsule (20 mg total) by mouth daily.   30 capsule   0   . hydrOXYzine  (ATARAX/VISTARIL) 25 MG tablet   Oral   Take 1 tablet (25 mg total) by mouth every 6 (six) hours as needed for anxiety.   30 tablet   0   . iron polysaccharides (NIFEREX) 150 MG capsule   Oral   Take 1 capsule (150 mg total) by mouth 2 (two) times daily.   60 capsule   0   . lamoTRIgine (LAMICTAL) 200 MG tablet   Oral   Take 1 tablet (200 mg total) by mouth every morning.   30 tablet   0   . OLANZapine (ZYPREXA) 5 MG tablet   Oral   Take 1 tablet (5 mg total) by mouth at bedtime.   30 tablet   0   . ondansetron (ZOFRAN ODT) 4 MG disintegrating tablet   Oral   Take 1 tablet (4 mg total) by mouth every 8 (eight) hours as needed for nausea.   10 tablet   0   . Probiotic Product (ACIDOPHILUS PROBIOTIC BLEND) CAPS   Oral   Take 2 capsules by mouth 2 (two) times daily.   60 capsule   0   . rizatriptan (MAXALT-MLT) 10 MG disintegrating tablet   Oral   Take 10 mg by mouth as needed for migraine. For migraines May repeat in 2 hours if needed         . tiotropium (SPIRIVA) 18 MCG inhalation capsule   Inhalation   Place 1 capsule (18 mcg total) into inhaler and inhale daily.   30 capsule   0   . traZODone (DESYREL) 50 MG tablet   Oral   Take 1 tablet (50 mg total) by mouth at bedtime as needed and may repeat dose one time if needed for sleep.   30 tablet   0    BP 116/58  Pulse 77  Temp(Src) 98.2 F (36.8 C) (Oral)  Resp 22  Ht 5\' 2"  (1.575 m)  Wt 200 lb (90.719 kg)  BMI 36.57 kg/m2  SpO2 96%  LMP 10/18/2013 Physical Exam  Nursing note and vitals reviewed. Constitutional: She is oriented to person, place, and time. She appears well-developed and well-nourished. She does not appear ill. No distress.  obese  HENT:  Head: Normocephalic and atraumatic.  Right Ear: Tympanic membrane, external ear and ear canal normal.  Left Ear: Tympanic membrane, external ear and ear canal normal.  Nose: Nose normal.  Mouth/Throat: Uvula is midline, oropharynx is clear and  moist and mucous membranes are normal.  No temporal artery tenderness.   Eyes: Conjunctivae and EOM are normal. Pupils are equal, round, and reactive to light.  Neck: Normal range of motion.  No nuchal rigidity or meningeal signs  Cardiovascular: Normal rate, regular rhythm, normal heart sounds, intact distal pulses and normal pulses.   Pulmonary/Chest: Effort normal. No stridor. No respiratory distress. She has wheezes (mild, expiratory). She has no rales.  Abdominal: Soft. She exhibits no distension. There is no tenderness. There is no rigidity and no guarding.  Musculoskeletal: Normal range of motion.  Neurological: She is alert and oriented to person, place, and time. She has normal strength. Gait normal.  Skin: Skin is warm and dry. She is not diaphoretic. No erythema.  Psychiatric: She has a normal mood and affect. Her behavior is normal.    ED Course  Procedures (including critical care time) Labs Review Labs Reviewed  CBC WITH DIFFERENTIAL - Abnormal; Notable for the following:    Hemoglobin 11.2 (*)    HCT 34.7 (*)    All other components within normal limits  COMPREHENSIVE METABOLIC PANEL - Abnormal; Notable for the following:    Glucose, Bld 109 (*)    Albumin 3.3 (*)    Total Bilirubin <0.2 (*)    All other components within normal limits  CK  CG4 I-STAT (LACTIC ACID)   Imaging Review Dg Chest 2 View  10/25/2013   CLINICAL DATA:  Cough.  Shortness breath.  Bilateral chest pain.  EXAM: CHEST  2 VIEW  COMPARISON:  Chest x-ray 04/05/2013.  FINDINGS: Elevation of the left hemidiaphragm. Subsegmental atelectasis versus scarring in the left perihilar region. No definite consolidative airspace disease. No pleural effusions. No evidence of pulmonary edema. Heart size is normal. Upper mediastinal contours are within normal limits. Probable small hiatal hernia.  IMPRESSION: 1. No radiographic evidence of acute cardiopulmonary disease. 2. The appearance the chest appears similar to  prior studies, as detailed above.   Electronically Signed   By: Trudie Reedaniel  Entrikin M.D.   On: 10/25/2013 08:51    EKG Interpretation   None       MDM   1. Cough   2. Viral illness    Patient with influenza like illness x 8 days. Labwork and CXR are unremarkable. Pt feels slightly improved after breathing treatment. I still believe this is most consistent with viral illness. No indication for abx. She was d/c'd home with prednisone burst due to wheezing. Return instructions given. Vital signs stable for discharge. Discussed case with Dr. Fayrene FearingJames who agrees with plan. Patient / Family / Caregiver informed of clinical course, understand medical decision-making process, and agree with plan.    Mora BellmanHannah S Bettyjane Shenoy, PA-C 10/26/13 [phone]

## 2013-10-25 NOTE — ED Notes (Signed)
Pt reports having productive cough with yellow sputum. Pt states that she was seen here for same several days ago. Pt was prescribed tussinex with no relief. Pt reports fevers at night, generalized body aches, cough, nasal drainage,  diarrhea, N/V, and headache.

## 2013-10-25 NOTE — Discharge Instructions (Signed)
Cough, Adult  A cough is a reflex. It helps you clear your throat and airways. A cough can help heal your body. A cough can last 2 or 3 weeks (acute) or may last more than 8 weeks (chronic). Some common causes of a cough can include an infection, allergy, or a cold. HOME CARE  Only take medicine as told by your doctor.  If given, take your medicines (antibiotics) as told. Finish them even if you start to feel better.  Use a cold steam vaporizer or humidier in your home. This can help loosen thick spit (secretions).  Sleep so you are almost sitting up (semi-upright). Use pillows to do this. This helps reduce coughing.  Rest as needed.  Stop smoking if you smoke. GET HELP RIGHT AWAY IF:  You have yellowish-white fluid (pus) in your thick spit.  Your cough gets worse.  Your medicine does not reduce coughing, and you are losing sleep.  You cough up blood.  You have trouble breathing.  Your pain gets worse and medicine does not help.  You have a fever. MAKE SURE YOU:   Understand these instructions.  Will watch your condition.  Will get help right away if you are not doing well or get worse. Document Released: 05/28/2011 Document Revised: 12/07/2011 Document Reviewed: 05/28/2011 [employer] Patient Information 2014 [employer].  

## 2013-10-27 NOTE — ED Provider Notes (Signed)
Medical screening examination/treatment/procedure(s) were performed by non-physician practitioner and as supervising physician I was immediately available for consultation/collaboration.  EKG Interpretation   None         Jerardo Costabile, MD 10/27/13 1739 

## 2013-11-21 ENCOUNTER — Emergency Department (HOSPITAL_COMMUNITY)
Admission: EM | Admit: 2013-11-21 | Discharge: 2013-11-21 | Disposition: A | Discharge status: Home / Self Care | Payer: Self-pay | Attending: Emergency Medicine | Admitting: Emergency Medicine

## 2013-11-21 ENCOUNTER — Encounter (HOSPITAL_COMMUNITY): Payer: Self-pay | Admitting: Emergency Medicine

## 2013-11-21 ENCOUNTER — Emergency Department (HOSPITAL_COMMUNITY): Payer: Medicaid Other

## 2013-11-21 DIAGNOSIS — Z792 Long term (current) use of antibiotics: Secondary | ICD-10-CM | POA: Insufficient documentation

## 2013-11-21 DIAGNOSIS — F17200 Nicotine dependence, unspecified, uncomplicated: Secondary | ICD-10-CM | POA: Insufficient documentation

## 2013-11-21 DIAGNOSIS — Z880 Allergy status to penicillin: Secondary | ICD-10-CM | POA: Insufficient documentation

## 2013-11-21 DIAGNOSIS — E669 Obesity, unspecified: Secondary | ICD-10-CM | POA: Insufficient documentation

## 2013-11-21 DIAGNOSIS — J189 Pneumonia, unspecified organism: Secondary | ICD-10-CM

## 2013-11-21 DIAGNOSIS — R197 Diarrhea, unspecified: Secondary | ICD-10-CM | POA: Insufficient documentation

## 2013-11-21 DIAGNOSIS — J441 Chronic obstructive pulmonary disease with (acute) exacerbation: Secondary | ICD-10-CM | POA: Insufficient documentation

## 2013-11-21 DIAGNOSIS — Z79899 Other long term (current) drug therapy: Secondary | ICD-10-CM | POA: Insufficient documentation

## 2013-11-21 DIAGNOSIS — R61 Generalized hyperhidrosis: Secondary | ICD-10-CM | POA: Insufficient documentation

## 2013-11-21 DIAGNOSIS — J159 Unspecified bacterial pneumonia: Secondary | ICD-10-CM | POA: Insufficient documentation

## 2013-11-21 DIAGNOSIS — F319 Bipolar disorder, unspecified: Secondary | ICD-10-CM | POA: Insufficient documentation

## 2013-11-21 DIAGNOSIS — Z8639 Personal history of other endocrine, nutritional and metabolic disease: Secondary | ICD-10-CM | POA: Insufficient documentation

## 2013-11-21 DIAGNOSIS — F411 Generalized anxiety disorder: Secondary | ICD-10-CM | POA: Insufficient documentation

## 2013-11-21 DIAGNOSIS — R112 Nausea with vomiting, unspecified: Secondary | ICD-10-CM | POA: Insufficient documentation

## 2013-11-21 DIAGNOSIS — Z862 Personal history of diseases of the blood and blood-forming organs and certain disorders involving the immune mechanism: Secondary | ICD-10-CM | POA: Insufficient documentation

## 2013-11-21 DIAGNOSIS — D649 Anemia, unspecified: Secondary | ICD-10-CM | POA: Insufficient documentation

## 2013-11-21 DIAGNOSIS — R51 Headache: Secondary | ICD-10-CM | POA: Insufficient documentation

## 2013-11-21 DIAGNOSIS — G43909 Migraine, unspecified, not intractable, without status migrainosus: Secondary | ICD-10-CM | POA: Insufficient documentation

## 2013-11-21 MED ORDER — ONDANSETRON 4 MG PO TBDP
4.0000 mg | ORAL_TABLET | Freq: Once | ORAL | Status: AC
  Administered 2013-11-21: 4 mg via ORAL
  Filled 2013-11-21: qty 1

## 2013-11-21 MED ORDER — PROMETHAZINE HCL 25 MG PO TABS: 25.0000 mg | ORAL_TABLET | Freq: Four times a day (QID) | ORAL | Status: DC | PRN

## 2013-11-21 MED ORDER — ALBUTEROL SULFATE (2.5 MG/3ML) 0.083% IN NEBU
5.0000 mg | INHALATION_SOLUTION | Freq: Once | RESPIRATORY_TRACT | Status: AC
  Administered 2013-11-21: 5 mg via RESPIRATORY_TRACT
  Filled 2013-11-21: qty 6

## 2013-11-21 MED ORDER — AZITHROMYCIN 250 MG PO TABS: 250.0000 mg | ORAL_TABLET | Freq: Every day | ORAL | Status: DC

## 2013-11-21 MED ORDER — IBUPROFEN 400 MG PO TABS
800.0000 mg | ORAL_TABLET | Freq: Once | ORAL | Status: AC
  Administered 2013-11-21: 800 mg via ORAL
  Filled 2013-11-21: qty 2

## 2013-11-21 MED ORDER — TRAMADOL HCL 50 MG PO TABS
50.0000 mg | ORAL_TABLET | Freq: Once | ORAL | Status: AC
  Administered 2013-11-21: 50 mg via ORAL
  Filled 2013-11-21: qty 1

## 2013-11-21 NOTE — ED Notes (Addendum)
C/o flu like sx, onset Friday, lists cough, body aches, sore throat, HA, sob. Was seen here 2 weeks ago, "never got better", has taken DM. Last cigarette 2d ago. Finished prednisone prescribed 2 weeks ago.

## 2013-11-21 NOTE — ED Provider Notes (Signed)
CSN: [id number]     Arrival date & time 11/21/13  2005 History   First MD Initiated Contact with Patient 11/21/13 2137     Chief Complaint  Patient presents with  . Influenza  . Cough  . Generalized Body Aches     (Consider location/radiation/quality/duration/timing/severity/associated sxs/prior Treatment) HPI Pt with hx of COPD presenting today with 4 day hx of gradually worsening flu-like symptoms including cough, congestion, associated with generalized body aches. Body aches are 8/10. Reports finishing prednisone for cough and URI but reports symptoms worsened. C/o n/v/d. States she had 3 episodes of NBNB emesis today and 1 episode of watery diarrhea yesterday.  Pt also reports losing her voice yesterday. Pt is a smoker, but states "not a lot."  Denies known sick contacts or recent travel. Denies chest pain or abdominal pain.   Past Medical History  Diagnosis Date  . Bipolar 1 disorder   . Migraines   . Wears dentures     upper denture  . Adenotonsillar hypertrophy 03/2012    snores during sleep; denies apnea; states occ. wakes up coughing  . COPD (chronic obstructive pulmonary disease)   . Anemia   . Depression   . Anxiety   . Obesity   . Hypercholesterolemia    Past Surgical History  Procedure Laterality Date  . Cesarean section  08/31/2001    total of  3  . Appendectomy    . Cholecystectomy    . Tubal ligation  08/31/2001  . Dilation and evacuation  04/09/2000  . Tonsillectomy and adenoidectomy  04/12/2012    Procedure: TONSILLECTOMY AND ADENOIDECTOMY;  Surgeon: Darletta MollSui W Teoh, MD;  Location: [facility];  Service: ENT;  Laterality: Bilateral;  . Tonsillectomy     No family history on file. History  Substance Use Topics  . Smoking status: Current Every Day Smoker -- 0.33 packs/day    Types: Cigarettes  . Smokeless tobacco: Never Used     Comment: quit smoking 11/2011  . Alcohol Use: No   OB History   Grav Para Term Preterm Abortions TAB SAB Ect Mult  Living                 Review of Systems  Constitutional: Positive for fever, chills, diaphoresis and fatigue.  HENT: Positive for congestion, sore throat and voice change ( "lost voice"). Negative for trouble swallowing.   Respiratory: Positive for cough and shortness of breath.   Cardiovascular: Negative for chest pain.  Gastrointestinal: Positive for nausea, vomiting and diarrhea. Negative for abdominal pain and constipation.  Musculoskeletal: Positive for myalgias. Negative for neck pain and neck stiffness.  Neurological: Positive for weakness and headaches.  All other systems reviewed and are negative.      Allergies  Penicillins  Home Medications   Current Outpatient Rx  Name  Route  Sig  Dispense  Refill  . albuterol (PROVENTIL HFA;VENTOLIN HFA) 108 (90 BASE) MCG/ACT inhaler   Inhalation   Inhale 2 puffs into the lungs every 8 (eight) hours as needed for wheezing or shortness of breath.         . butalbital-acetaminophen-caffeine (FIORICET, ESGIC) 50-325-40 MG per tablet   Oral   Take 1 tablet by mouth 4 (four) times daily as needed for headache.          . cholecalciferol (VITAMIN D) 1000 UNITS tablet   Oral   Take 5 tablets (5,000 Units total) by mouth daily.   150 tablet   0   . Dextromethorphan-Guaifenesin (  TUSSIN DM PO)   Oral   Take 10 mLs by mouth 3 (three) times daily as needed (cough).         . diazepam (VALIUM) 5 MG tablet   Oral   Take 5 mg by mouth 2 (two) times daily as needed for anxiety.         Marland Kitchen FLUoxetine (PROZAC) 20 MG capsule   Oral   Take 1 capsule (20 mg total) by mouth daily.   30 capsule   0   . gabapentin (NEURONTIN) 300 MG capsule   Oral   Take 300 mg by mouth 3 (three) times daily.         . hydrOXYzine (ATARAX/VISTARIL) 25 MG tablet   Oral   Take 1 tablet (25 mg total) by mouth every 6 (six) hours as needed for anxiety.   30 tablet   0   . iron polysaccharides (NIFEREX) 150 MG capsule   Oral   Take 1  capsule (150 mg total) by mouth 2 (two) times daily.   60 capsule   0   . lamoTRIgine (LAMICTAL) 200 MG tablet   Oral   Take 1 tablet (200 mg total) by mouth every morning.   30 tablet   0   . Multiple Vitamin (MULTIVITAMIN WITH MINERALS) TABS tablet   Oral   Take 1 tablet by mouth daily.         Marland Kitchen OLANZapine (ZYPREXA) 5 MG tablet   Oral   Take 1 tablet (5 mg total) by mouth at bedtime.   30 tablet   0   . Probiotic Product (ACIDOPHILUS PROBIOTIC BLEND) CAPS   Oral   Take 2 capsules by mouth 2 (two) times daily.   60 capsule   0   . rizatriptan (MAXALT-MLT) 10 MG disintegrating tablet   Oral   Take 10 mg by mouth as needed for migraine. May repeat in 2 hours if needed         . tiotropium (SPIRIVA) 18 MCG inhalation capsule   Inhalation   Place 1 capsule (18 mcg total) into inhaler and inhale daily.   30 capsule   0   . traZODone (DESYREL) 50 MG tablet   Oral   Take 1 tablet (50 mg total) by mouth at bedtime as needed and may repeat dose one time if needed for sleep.   30 tablet   0   . azithromycin (ZITHROMAX) 250 MG tablet   Oral   Take 1 tablet (250 mg total) by mouth daily. Take first 2 tablets together, then 1 every day until finished.   6 tablet   0   . promethazine (PHENERGAN) 25 MG tablet   Oral   Take 1 tablet (25 mg total) by mouth every 6 (six) hours as needed for nausea or vomiting.   12 tablet   0    BP 116/70  Pulse 89  Temp(Src) 99.1 F (37.3 C) (Oral)  Resp 20  SpO2 94%  LMP 11/14/2013 Physical Exam  Nursing note and vitals reviewed. Constitutional: She appears well-developed and well-nourished.  Pt lying in exam bed, appears uncomfortable.  HENT:  Head: Normocephalic and atraumatic.  Eyes: Conjunctivae are normal. No scleral icterus.  Neck: Normal range of motion. Neck supple.  No nuchal rigidity or meningeal signs.  Cardiovascular: Normal rate, regular rhythm and normal heart sounds.   Pulmonary/Chest: Effort normal. No  respiratory distress. She has wheezes ( mild bibasilar expiratory wheeze). She has no rales. She exhibits no  tenderness.  Abdominal: Soft. Bowel sounds are normal. She exhibits no distension and no mass. There is no tenderness. There is no rebound and no guarding.  Musculoskeletal: Normal range of motion.  Neurological: She is alert.  Skin: Skin is warm and dry.    ED Course  Procedures (including critical care time) Labs Review Labs Reviewed - No data to display Imaging Review Dg Chest 2 View (if Patient Has Fever And/or Copd)  11/21/2013   CLINICAL DATA:  Persistent cough, body aches and fever.  EXAM: CHEST  2 VIEW  COMPARISON:  Chest radiograph performed 10/25/2013  FINDINGS: The lungs are well-aerated. Left basilar airspace opacity is more prominent than on prior studies, and there is new mild right basilar airspace opacity. This could reflect mild pneumonia, given the patient's symptoms. This is superimposed on the patient's chronic left basilar scarring. There is chronic elevation of the left hemidiaphragm. No significant pleural effusion or pneumothorax is seen.  The heart is normal in size; the mediastinal contour is within normal limits. No acute osseous abnormalities are seen. Clips are noted within the right upper quadrant, reflecting prior cholecystectomy.  IMPRESSION: Left basilar airspace opacity is more prominent than on prior studies, with new mild right basilar airspace opacity. This could reflect mild pneumonia, given the patient's symptoms. Underlying chronic left basilar scarring and elevation of the left hemidiaphragm again seen.   Electronically Signed   By: Roanna Raider M.D.   On: 11/21/2013 21:26    EKG Interpretation   None       MDM   Final diagnoses:  CAP (community acquired pneumonia)    pt presenting with flu-like symptoms, gradually worsening over last 4 days.  CXR performed: evidence of left lower lobe pneumonia.  Pt was on prednisone for cough earlier  this month.  Will tx pt with azithromycin and phenergan. Advised pt to use acetaminophen and ibuprofen as needed for fever and pain. Encouraged rest and fluids.advised to f/u with her PCP later this week. Return precautions provided. Pt verbalized understanding and agreement with tx plan.     Junius Finner, PA-C 11/22/13 [phone]

## 2013-11-21 NOTE — ED Notes (Signed)
Pt states she was on prednisone for cough and URI, states that she has finished medication but symptoms have not gotten any better, Pt also states that she just recently lost her voice yesterday and has started having facial swelling today. Pt able to speak in complete sentences, denies trouble breathing.

## 2013-11-21 NOTE — Discharge Instructions (Signed)
Take 400-600mg Ibuprofen (Motrin) every 6-8 hours for fever and pain  °Alternate with Tylenol  °Take 500-1000mg Tylenol every 4-6 hours as needed for fever and pain  °Follow-up with your primary care provider next week for recheck of symptoms if not improving.  °Be sure to drink plenty of fluids and rest, at least 8hrs of sleep a night, preferably more while you are sick. °Return to the ED if you cannot keep down fluids/signs of dehydration, fever not reducing with Tylenol, difficulty breathing/wheezing, stiff neck, worsening condition, or other concerns (see below)  ° °

## 2013-11-22 NOTE — ED Provider Notes (Signed)
Medical screening examination/treatment/procedure(s) were performed by non-physician practitioner and as supervising physician I was immediately available for consultation/collaboration.  EKG Interpretation   None        Courtney F Horton, MD 11/22/13 0943 

## 2013-11-28 ENCOUNTER — Emergency Department (HOSPITAL_COMMUNITY): Payer: Self-pay

## 2013-11-28 ENCOUNTER — Encounter (HOSPITAL_COMMUNITY): Payer: Self-pay | Admitting: Emergency Medicine

## 2013-11-28 ENCOUNTER — Emergency Department (HOSPITAL_COMMUNITY): Payer: Medicaid Other

## 2013-11-28 ENCOUNTER — Emergency Department (HOSPITAL_COMMUNITY)
Admission: EM | Admit: 2013-11-28 | Discharge: 2013-11-28 | Disposition: A | Discharge status: Home / Self Care | Payer: Self-pay | Attending: Emergency Medicine | Admitting: Emergency Medicine

## 2013-11-28 DIAGNOSIS — F319 Bipolar disorder, unspecified: Secondary | ICD-10-CM | POA: Insufficient documentation

## 2013-11-28 DIAGNOSIS — F411 Generalized anxiety disorder: Secondary | ICD-10-CM | POA: Insufficient documentation

## 2013-11-28 DIAGNOSIS — E7800 Pure hypercholesterolemia, unspecified: Secondary | ICD-10-CM | POA: Insufficient documentation

## 2013-11-28 DIAGNOSIS — E669 Obesity, unspecified: Secondary | ICD-10-CM | POA: Insufficient documentation

## 2013-11-28 DIAGNOSIS — J441 Chronic obstructive pulmonary disease with (acute) exacerbation: Secondary | ICD-10-CM | POA: Insufficient documentation

## 2013-11-28 DIAGNOSIS — F3289 Other specified depressive episodes: Secondary | ICD-10-CM | POA: Insufficient documentation

## 2013-11-28 DIAGNOSIS — R079 Chest pain, unspecified: Secondary | ICD-10-CM | POA: Insufficient documentation

## 2013-11-28 DIAGNOSIS — Z8701 Personal history of pneumonia (recurrent): Secondary | ICD-10-CM | POA: Insufficient documentation

## 2013-11-28 DIAGNOSIS — Z9089 Acquired absence of other organs: Secondary | ICD-10-CM | POA: Insufficient documentation

## 2013-11-28 DIAGNOSIS — Z98811 Dental restoration status: Secondary | ICD-10-CM | POA: Insufficient documentation

## 2013-11-28 DIAGNOSIS — R0600 Dyspnea, unspecified: Secondary | ICD-10-CM

## 2013-11-28 DIAGNOSIS — Z79899 Other long term (current) drug therapy: Secondary | ICD-10-CM | POA: Insufficient documentation

## 2013-11-28 DIAGNOSIS — D649 Anemia, unspecified: Secondary | ICD-10-CM | POA: Insufficient documentation

## 2013-11-28 DIAGNOSIS — Z792 Long term (current) use of antibiotics: Secondary | ICD-10-CM | POA: Insufficient documentation

## 2013-11-28 DIAGNOSIS — Z880 Allergy status to penicillin: Secondary | ICD-10-CM | POA: Insufficient documentation

## 2013-11-28 DIAGNOSIS — F17200 Nicotine dependence, unspecified, uncomplicated: Secondary | ICD-10-CM | POA: Insufficient documentation

## 2013-11-28 DIAGNOSIS — G43909 Migraine, unspecified, not intractable, without status migrainosus: Secondary | ICD-10-CM | POA: Insufficient documentation

## 2013-11-28 DIAGNOSIS — F329 Major depressive disorder, single episode, unspecified: Secondary | ICD-10-CM | POA: Insufficient documentation

## 2013-11-28 MED ORDER — ALBUTEROL SULFATE (2.5 MG/3ML) 0.083% IN NEBU
5.0000 mg | INHALATION_SOLUTION | Freq: Once | RESPIRATORY_TRACT | Status: AC
  Administered 2013-11-28: 5 mg via RESPIRATORY_TRACT
  Filled 2013-11-28: qty 6

## 2013-11-28 MED ORDER — PREDNISONE 50 MG PO TABS
60.0000 mg | ORAL_TABLET | ORAL | Status: AC
  Administered 2013-11-28: 60 mg via ORAL
  Filled 2013-11-28 (×2): qty 1

## 2013-11-28 MED ORDER — HYDROCOD POLST-CHLORPHEN POLST 10-8 MG/5ML PO LQCR
5.0000 mL | Freq: Once | ORAL | Status: AC
  Administered 2013-11-28: 5 mL via ORAL
  Filled 2013-11-28: qty 5

## 2013-11-28 MED ORDER — SODIUM CHLORIDE 0.9 % IV BOLUS (SEPSIS)
1000.0000 mL | Freq: Once | INTRAVENOUS | Status: AC
  Administered 2013-11-28: 1000 mL via INTRAVENOUS

## 2013-11-28 MED ORDER — ALBUTEROL SULFATE HFA 108 (90 BASE) MCG/ACT IN AERS
INHALATION_SPRAY | RESPIRATORY_TRACT | Status: AC
  Administered 2013-11-28: 2 via RESPIRATORY_TRACT
  Filled 2013-11-28: qty 6.7

## 2013-11-28 MED ORDER — PREDNISONE 20 MG PO TABS: 60.0000 mg | ORAL_TABLET | Freq: Every day | ORAL | Status: AC

## 2013-11-28 MED ORDER — HYDROCOD POLST-CHLORPHEN POLST 10-8 MG/5ML PO LQCR: 5.0000 mL | Freq: Two times a day (BID) | ORAL | Status: DC | PRN

## 2013-11-28 MED ORDER — IOHEXOL 350 MG/ML SOLN
100.0000 mL | Freq: Once | INTRAVENOUS | Status: AC | PRN
  Administered 2013-11-28: 100 mL via INTRAVENOUS

## 2013-11-28 MED ORDER — ALBUTEROL SULFATE HFA 108 (90 BASE) MCG/ACT IN AERS
2.0000 | INHALATION_SPRAY | Freq: Four times a day (QID) | RESPIRATORY_TRACT | Status: DC
  Administered 2013-11-28: 2 via RESPIRATORY_TRACT

## 2013-11-28 MED ORDER — DOXYCYCLINE HYCLATE 100 MG PO CAPS: 100.0000 mg | ORAL_CAPSULE | Freq: Two times a day (BID) | ORAL | Status: DC

## 2013-11-28 NOTE — Discharge Instructions (Signed)
As discussed, your evaluation tonight was largely reassuring.  Your symptoms are likely due to unresolved infection and your continued smoking.  In addition, you still have a hiatal hernia that requires followup with your primary care physician.  Please be sure to call tomorrow to schedule appropriate ongoing care.  Return here for concerning changes in your condition.

## 2013-11-28 NOTE — ED Provider Notes (Signed)
CSN: [id number]     Arrival date & time 11/28/13  1700 History   First MD Initiated Contact with Patient 11/28/13 1714     Chief Complaint  Patient presents with  . Cough     (Consider location/radiation/quality/duration/timing/severity/associated sxs/prior Treatment) HPI  Patient presents with concerns of ongoing cough, chest pressure.  Symptoms began approximately 3 weeks ago.  Initially she was diagnosed with influenza.  Subsequently she was diagnosed with pneumonia.  She has completed a course of both steroids and azithromycin, last 2 days ago. She notes her symptoms are persistent, with no relief from anything at this point. The chest pressure is anterior, nonradiating, nonpleuritic, nonexertional. Maximum temperature 102. No vomiting, no diarrhea, no confusion, no disorientation, no syncope. Patient continues to smoke throughout this illness.   Past Medical History  Diagnosis Date  . Bipolar 1 disorder   . Migraines   . Wears dentures     upper denture  . Adenotonsillar hypertrophy 03/2012    snores during sleep; denies apnea; states occ. wakes up coughing  . COPD (chronic obstructive pulmonary disease)   . Anemia   . Depression   . Anxiety   . Obesity   . Hypercholesterolemia    Past Surgical History  Procedure Laterality Date  . Cesarean section  08/31/2001    total of  3  . Appendectomy    . Cholecystectomy    . Tubal ligation  08/31/2001  . Dilation and evacuation  04/09/2000  . Tonsillectomy and adenoidectomy  04/12/2012    Procedure: TONSILLECTOMY AND ADENOIDECTOMY;  Surgeon: Darletta Moll, MD;  Location: [facility];  Service: ENT;  Laterality: Bilateral;  . Tonsillectomy     History reviewed. No pertinent family history. History  Substance Use Topics  . Smoking status: Current Every Day Smoker -- 0.33 packs/day    Types: Cigarettes  . Smokeless tobacco: Never Used     Comment: quit smoking 11/2011  . Alcohol Use: No   OB History   Grav Para  Term Preterm Abortions TAB SAB Ect Mult Living                 Review of Systems  Constitutional:       Per HPI, otherwise negative  HENT:       Per HPI, otherwise negative  Respiratory:       Per HPI, otherwise negative  Cardiovascular:       Per HPI, otherwise negative  Gastrointestinal: Negative for vomiting.  Endocrine:       Negative aside from HPI  Genitourinary:       Neg aside from HPI   Musculoskeletal:       Per HPI, otherwise negative  Skin: Negative.   Neurological: Negative for syncope.      Allergies  Penicillins  Home Medications   Current Outpatient Rx  Name  Route  Sig  Dispense  Refill  . albuterol (PROVENTIL HFA;VENTOLIN HFA) 108 (90 BASE) MCG/ACT inhaler   Inhalation   Inhale 2 puffs into the lungs every 8 (eight) hours as needed for wheezing or shortness of breath.         Marland Kitchen azithromycin (ZITHROMAX) 250 MG tablet   Oral   Take 1 tablet (250 mg total) by mouth daily. Take first 2 tablets together, then 1 every day until finished.   6 tablet   0   . butalbital-acetaminophen-caffeine (FIORICET, ESGIC) 50-325-40 MG per tablet   Oral   Take 1 tablet by mouth 4 (  four) times daily as needed for headache.          . cholecalciferol (VITAMIN D) 1000 UNITS tablet   Oral   Take 5 tablets (5,000 Units total) by mouth daily.   150 tablet   0   . Dextromethorphan-Guaifenesin (TUSSIN DM PO)   Oral   Take 10 mLs by mouth 3 (three) times daily as needed (cough).         . diazepam (VALIUM) 5 MG tablet   Oral   Take 5 mg by mouth 2 (two) times daily as needed for anxiety.         Marland Kitchen FLUoxetine (PROZAC) 20 MG capsule   Oral   Take 1 capsule (20 mg total) by mouth daily.   30 capsule   0   . gabapentin (NEURONTIN) 300 MG capsule   Oral   Take 300 mg by mouth 3 (three) times daily.         . hydrOXYzine (ATARAX/VISTARIL) 25 MG tablet   Oral   Take 1 tablet (25 mg total) by mouth every 6 (six) hours as needed for anxiety.   30 tablet    0   . iron polysaccharides (NIFEREX) 150 MG capsule   Oral   Take 1 capsule (150 mg total) by mouth 2 (two) times daily.   60 capsule   0   . lamoTRIgine (LAMICTAL) 200 MG tablet   Oral   Take 1 tablet (200 mg total) by mouth every morning.   30 tablet   0   . Multiple Vitamin (MULTIVITAMIN WITH MINERALS) TABS tablet   Oral   Take 1 tablet by mouth daily.         Marland Kitchen OLANZapine (ZYPREXA) 5 MG tablet   Oral   Take 1 tablet (5 mg total) by mouth at bedtime.   30 tablet   0   . Probiotic Product (ACIDOPHILUS PROBIOTIC BLEND) CAPS   Oral   Take 2 capsules by mouth 2 (two) times daily.   60 capsule   0   . promethazine (PHENERGAN) 25 MG tablet   Oral   Take 1 tablet (25 mg total) by mouth every 6 (six) hours as needed for nausea or vomiting.   12 tablet   0   . rizatriptan (MAXALT-MLT) 10 MG disintegrating tablet   Oral   Take 10 mg by mouth as needed for migraine. May repeat in 2 hours if needed         . tiotropium (SPIRIVA) 18 MCG inhalation capsule   Inhalation   Place 1 capsule (18 mcg total) into inhaler and inhale daily.   30 capsule   0   . traZODone (DESYREL) 50 MG tablet   Oral   Take 1 tablet (50 mg total) by mouth at bedtime as needed and may repeat dose one time if needed for sleep.   30 tablet   0    BP 138/88  Pulse 77  Temp(Src) 99.1 F (37.3 C) (Oral)  Resp 18  Ht 5\' 2"  (1.575 m)  Wt 210 lb (95.255 kg)  BMI 38.40 kg/m2  SpO2 100%  LMP 11/14/2013 Physical Exam  Nursing note and vitals reviewed. Constitutional: She is oriented to person, place, and time. She appears well-developed and well-nourished. No distress.  HENT:  Head: Normocephalic and atraumatic.  Eyes: Conjunctivae and EOM are normal.  Cardiovascular: Normal rate and regular rhythm.   Pulmonary/Chest: Effort normal. No stridor. No respiratory distress. She has wheezes.  Abdominal: She exhibits  no distension.  Musculoskeletal: She exhibits no edema.  Neurological: She is  alert and oriented to person, place, and time. No cranial nerve deficit.  Skin: Skin is warm and dry.  Psychiatric: She has a normal mood and affect.    ED Course  Procedures (including critical care time)  I reviewed the EMR   Imaging Review Dg Chest 2 View  11/28/2013   CLINICAL DATA:  Cough.  EXAM: CHEST  2 VIEW  COMPARISON:  11/21/2013.  FINDINGS: The heart is normal in size. The mediastinal and hilar contours are within normal limits and stable. There is also a large Stable hiatal hernia containing stomach and bowel. . Chronic interstitial changes but no definite acute overlying pulmonary process. Low lung volumes with bibasilar atelectasis.  IMPRESSION: Low lung volumes with vascular crowding and bibasilar atelectasis.  Mild chronic interstitial changes.  Large hiatal hernia.   Electronically Signed   By: Loralie ChampagneMark  Gallerani M.D.   On: 11/28/2013 18:36   every the x-ray.  On exam the patient has audible wheezing on expiration.  She continues to chest pain, is tachypneic.  With abnormal x-ray, persistent dyspnea, smoking HX, PE remains a consideration.  9:02 PM On repeat exam the patient appears better.  We discussed all findings, including hiatal hernia.  I again discussed the importance of smoking cessation. In total we discussed this for greater than 5 minutes.    MDM   Final diagnoses:  Dyspnea  Chest pain    This patient presents with ongoing chest pain, dyspnea, cough.  Given the patient's smoking history the absence of improvement with her antibiotics, steroids, inhalers, with the tachypnea, dyspnea, PE remain on the differential.  CT scan was reassuring.  Patient improved somewhat here.  However, patient was appropriate for discharge with further evaluation, management as an outpatient given the region labs, the absence of PE on CT, and the low suspicion for occult systemic infection or atypical ACS.    Gerhard Munchobert Kaia Depaolis, MD 11/28/13 2103

## 2013-11-28 NOTE — ED Notes (Signed)
Recent dx with pneumonia [facility],  Pt says she is no better , but has finished her antibiotic,  Continuing to cough, N/P.  Out of tussinex.  , finished z pak yesterday . "sore all over from coughing"

## 2013-12-02 ENCOUNTER — Encounter (HOSPITAL_COMMUNITY): Payer: Self-pay | Admitting: Emergency Medicine

## 2013-12-02 ENCOUNTER — Emergency Department (HOSPITAL_COMMUNITY)
Admission: EM | Admit: 2013-12-02 | Discharge: 2013-12-02 | Disposition: A | Discharge status: Home / Self Care | Payer: Self-pay | Attending: Emergency Medicine | Admitting: Emergency Medicine

## 2013-12-02 ENCOUNTER — Emergency Department (HOSPITAL_COMMUNITY): Payer: Medicaid Other

## 2013-12-02 DIAGNOSIS — F3289 Other specified depressive episodes: Secondary | ICD-10-CM | POA: Insufficient documentation

## 2013-12-02 DIAGNOSIS — F329 Major depressive disorder, single episode, unspecified: Secondary | ICD-10-CM | POA: Insufficient documentation

## 2013-12-02 DIAGNOSIS — R109 Unspecified abdominal pain: Secondary | ICD-10-CM | POA: Insufficient documentation

## 2013-12-02 DIAGNOSIS — Z8719 Personal history of other diseases of the digestive system: Secondary | ICD-10-CM | POA: Insufficient documentation

## 2013-12-02 DIAGNOSIS — J449 Chronic obstructive pulmonary disease, unspecified: Secondary | ICD-10-CM | POA: Insufficient documentation

## 2013-12-02 DIAGNOSIS — G43909 Migraine, unspecified, not intractable, without status migrainosus: Secondary | ICD-10-CM | POA: Insufficient documentation

## 2013-12-02 DIAGNOSIS — E7800 Pure hypercholesterolemia, unspecified: Secondary | ICD-10-CM | POA: Insufficient documentation

## 2013-12-02 DIAGNOSIS — F17200 Nicotine dependence, unspecified, uncomplicated: Secondary | ICD-10-CM | POA: Insufficient documentation

## 2013-12-02 DIAGNOSIS — R05 Cough: Secondary | ICD-10-CM | POA: Insufficient documentation

## 2013-12-02 DIAGNOSIS — J4489 Other specified chronic obstructive pulmonary disease: Secondary | ICD-10-CM | POA: Insufficient documentation

## 2013-12-02 DIAGNOSIS — F319 Bipolar disorder, unspecified: Secondary | ICD-10-CM | POA: Insufficient documentation

## 2013-12-02 DIAGNOSIS — F411 Generalized anxiety disorder: Secondary | ICD-10-CM | POA: Insufficient documentation

## 2013-12-02 DIAGNOSIS — R197 Diarrhea, unspecified: Secondary | ICD-10-CM | POA: Insufficient documentation

## 2013-12-02 DIAGNOSIS — R059 Cough, unspecified: Secondary | ICD-10-CM | POA: Insufficient documentation

## 2013-12-02 DIAGNOSIS — Z880 Allergy status to penicillin: Secondary | ICD-10-CM | POA: Insufficient documentation

## 2013-12-02 DIAGNOSIS — Z79899 Other long term (current) drug therapy: Secondary | ICD-10-CM | POA: Insufficient documentation

## 2013-12-02 DIAGNOSIS — Z8701 Personal history of pneumonia (recurrent): Secondary | ICD-10-CM | POA: Insufficient documentation

## 2013-12-02 HISTORY — DX: Pneumonia, unspecified organism: J18.9

## 2013-12-02 LAB — CBC WITH DIFFERENTIAL/PLATELET
Basophils Absolute: 0 K/uL (ref 0.0–0.1)
Basophils Relative: 0 % (ref 0–1)
Eosinophils Absolute: 0 K/uL (ref 0.0–0.7)
Eosinophils Relative: 0 % (ref 0–5)
HCT: 35.5 % — ABNORMAL LOW (ref 36.0–46.0)
Hemoglobin: 11.6 g/dL — ABNORMAL LOW (ref 12.0–15.0)
Lymphocytes Relative: 10 % — ABNORMAL LOW (ref 12–46)
Lymphs Abs: 1.7 K/uL (ref 0.7–4.0)
MCH: 26.1 pg (ref 26.0–34.0)
MCHC: 32.7 g/dL (ref 30.0–36.0)
MCV: 79.8 fL (ref 78.0–100.0)
Monocytes Absolute: 0.4 K/uL (ref 0.1–1.0)
Monocytes Relative: 2 % — ABNORMAL LOW (ref 3–12)
Neutro Abs: 14 K/uL — ABNORMAL HIGH (ref 1.7–7.7)
Neutrophils Relative %: 87 % — ABNORMAL HIGH (ref 43–77)
Platelets: 404 K/uL — ABNORMAL HIGH (ref 150–400)
RBC: 4.45 MIL/uL (ref 3.87–5.11)
RDW: 15.7 % — ABNORMAL HIGH (ref 11.5–15.5)
WBC: 16 K/uL — ABNORMAL HIGH (ref 4.0–10.5)

## 2013-12-02 LAB — LIPASE, BLOOD: Lipase: 18 U/L (ref 11–59)

## 2013-12-02 LAB — COMPREHENSIVE METABOLIC PANEL
ALBUMIN: 3.4 g/dL — AB (ref 3.5–5.2)
ALT: 13 U/L (ref 0–35)
AST: 10 U/L (ref 0–37)
Alkaline Phosphatase: 82 U/L (ref 39–117)
BUN: 12 mg/dL (ref 6–23)
CALCIUM: 8.9 mg/dL (ref 8.4–10.5)
CO2: 22 mEq/L (ref 19–32)
Chloride: 101 mEq/L (ref 96–112)
Creatinine, Ser: 0.81 mg/dL (ref 0.50–1.10)
GFR calc non Af Amer: >90 mL/min (ref >90)
Glucose, Bld: 219 mg/dL — ABNORMAL HIGH (ref 70–99)
Potassium: 4 mEq/L (ref 3.7–5.3)
Sodium: 137 mEq/L (ref 137–147)
TOTAL PROTEIN: 6.6 g/dL (ref 6.0–8.3)
Total Bilirubin: <0.2 mg/dL — ABNORMAL LOW (ref 0.3–1.2)

## 2013-12-02 LAB — URINALYSIS, ROUTINE W REFLEX MICROSCOPIC
BILIRUBIN URINE: NEGATIVE
GLUCOSE, UA: 500 mg/dL — AB
Hgb urine dipstick: NEGATIVE
Ketones, ur: NEGATIVE mg/dL
Leukocytes, UA: NEGATIVE
NITRITE: NEGATIVE
Protein, ur: NEGATIVE mg/dL
SPECIFIC GRAVITY, URINE: 1.034 — AB (ref 1.005–1.030)
Urobilinogen, UA: 0.2 mg/dL (ref 0.0–1.0)
pH: 6 (ref 5.0–8.0)

## 2013-12-02 LAB — POC URINE PREG, ED: Preg Test, Ur: NEGATIVE

## 2013-12-02 MED ORDER — SODIUM CHLORIDE 0.9 % IV SOLN
Freq: Once | INTRAVENOUS | Status: AC
  Administered 2013-12-02: 17:00:00 via INTRAVENOUS

## 2013-12-02 MED ORDER — IOHEXOL 300 MG/ML  SOLN
100.0000 mL | Freq: Once | INTRAMUSCULAR | Status: AC | PRN
  Administered 2013-12-02: 100 mL via INTRAVENOUS

## 2013-12-02 MED ORDER — ONDANSETRON HCL 4 MG/2ML IJ SOLN
4.0000 mg | Freq: Once | INTRAMUSCULAR | Status: AC
  Administered 2013-12-02: 4 mg via INTRAVENOUS
  Filled 2013-12-02: qty 2

## 2013-12-02 MED ORDER — IOHEXOL 300 MG/ML  SOLN
20.0000 mL | INTRAMUSCULAR | Status: AC
  Administered 2013-12-02: 25 mL via ORAL

## 2013-12-02 MED ORDER — DICYCLOMINE HCL 10 MG PO CAPS: 10.0000 mg | ORAL_CAPSULE | Freq: Three times a day (TID) | ORAL | Status: DC

## 2013-12-02 MED ORDER — HYDROMORPHONE HCL PF 1 MG/ML IJ SOLN
1.0000 mg | Freq: Once | INTRAMUSCULAR | Status: AC
  Administered 2013-12-02: 1 mg via INTRAVENOUS
  Filled 2013-12-02: qty 1

## 2013-12-02 MED ORDER — DICYCLOMINE HCL 10 MG PO CAPS
10.0000 mg | ORAL_CAPSULE | Freq: Once | ORAL | Status: AC
  Administered 2013-12-02: 10 mg via ORAL
  Filled 2013-12-02: qty 1

## 2013-12-02 MED ORDER — HYDROCODONE-ACETAMINOPHEN 5-325 MG PO TABS
1.0000 | ORAL_TABLET | Freq: Once | ORAL | Status: AC
  Administered 2013-12-02: 1 via ORAL
  Filled 2013-12-02: qty 1

## 2013-12-02 MED ORDER — HYDROCODONE-ACETAMINOPHEN 5-325 MG PO TABS: 1.0000 | ORAL_TABLET | Freq: Four times a day (QID) | ORAL | Status: DC | PRN

## 2013-12-02 NOTE — ED Provider Notes (Signed)
The CT scan has been reviewed.  It shows that A. she has a stable hiatal hernia compared to other x-rays, and no other pathology to explain her abdominal pain, just been discharged home with a short course of Vicodin for pain control, as well as Bentyl, and instructions to follow up with her primary care physician  Arman FilterGail K Joncarlos Atkison, NP 12/02/13 2202  Arman FilterGail K Gearldine Looney, NP 12/02/13 2203

## 2013-12-02 NOTE — ED Notes (Signed)
Pt st's pain has returned again, rates pain a #9 on pain scale 0/10.  Family at bedside.

## 2013-12-02 NOTE — ED Notes (Signed)
Pt finished drinking contrast, CT made aware.

## 2013-12-02 NOTE — ED Notes (Signed)
Pt requesting more pain meds

## 2013-12-02 NOTE — ED Notes (Addendum)
Pt states she needed to have a BM this afternoon and when she did it was diarrhea and shes had severe abd pain and cramping since. Shes also had nausea but no vomiting. She is currently being treated for pneumonia

## 2013-12-02 NOTE — ED Notes (Signed)
Ginger Ale given to pt. To drink for oral trial

## 2013-12-02 NOTE — Discharge Instructions (Signed)
Abdominal Pain, Women °Abdominal (stomach, pelvic, or belly) pain can be caused by many things. It is important to tell your doctor: °· The location of the pain. °· Does it come and go or is it present all the time? °· Are there things that start the pain (eating certain foods, exercise)? °· Are there other symptoms associated with the pain (fever, nausea, vomiting, diarrhea)? °All of this is helpful to know when trying to find the cause of the pain. °CAUSES  °· Stomach: virus or bacteria infection, or ulcer. °· Intestine: appendicitis (inflamed appendix), regional ileitis (Crohn's disease), ulcerative colitis (inflamed colon), irritable bowel syndrome, diverticulitis (inflamed diverticulum of the colon), or cancer of the stomach or intestine. °· Gallbladder disease or stones in the gallbladder. °· Kidney disease, kidney stones, or infection. °· Pancreas infection or cancer. °· Fibromyalgia (pain disorder). °· Diseases of the female organs: °· Uterus: fibroid (non-cancerous) tumors or infection. °· Fallopian tubes: infection or tubal pregnancy. °· Ovary: cysts or tumors. °· Pelvic adhesions (scar tissue). °· Endometriosis (uterus lining tissue growing in the pelvis and on the pelvic organs). °· Pelvic congestion syndrome (female organs filling up with blood just before the menstrual period). °· Pain with the menstrual period. °· Pain with ovulation (producing an egg). °· Pain with an IUD (intrauterine device, birth control) in the uterus. °· Cancer of the female organs. °· Functional pain (pain not caused by a disease, may improve without treatment). °· Psychological pain. °· Depression. °DIAGNOSIS  °Your doctor will decide the seriousness of your pain by doing an examination. °· Blood tests. °· X-rays. °· Ultrasound. °· CT scan (computed tomography, special type of X-ray). °· MRI (magnetic resonance imaging). °· Cultures, for infection. °· Barium enema (dye inserted in the large intestine, to better view it with  X-rays). °· Colonoscopy (looking in intestine with a lighted tube). °· Laparoscopy (minor surgery, looking in abdomen with a lighted tube). °· Major abdominal exploratory surgery (looking in abdomen with a large incision). °TREATMENT  °The treatment will depend on the cause of the pain.  °· Many cases can be observed and treated at home. °· Over-the-counter medicines recommended by your caregiver. °· Prescription medicine. °· Antibiotics, for infection. °· Birth control pills, for painful periods or for ovulation pain. °· Hormone treatment, for endometriosis. °· Nerve blocking injections. °· Physical therapy. °· Antidepressants. °· Counseling with a psychologist or psychiatrist. °· Minor or major surgery. °HOME CARE INSTRUCTIONS  °· Do not take laxatives, unless directed by your caregiver. °· Take over-the-counter pain medicine only if ordered by your caregiver. Do not take aspirin because it can cause an upset stomach or bleeding. °· Try a clear liquid diet (broth or water) as ordered by your caregiver. Slowly move to a bland diet, as tolerated, if the pain is related to the stomach or intestine. °· Have a thermometer and take your temperature several times a day, and record it. °· Bed rest and sleep, if it helps the pain. °· Avoid sexual intercourse, if it causes pain. °· Avoid stressful situations. °· Keep your follow-up appointments and tests, as your caregiver orders. °· If the pain does not go away with medicine or surgery, you may try: °· Acupuncture. °· Relaxation exercises (yoga, meditation). °· Group therapy. °· Counseling. °SEEK MEDICAL CARE IF:  °· You notice certain foods cause stomach pain. °· Your home care treatment is not helping your pain. °· You need stronger pain medicine. °· You want your IUD removed. °· You feel faint or   lightheaded.  You develop nausea and vomiting.  You develop a rash.  You are having side effects or an allergy to your medicine. SEEK IMMEDIATE MEDICAL CARE IF:   Your  pain does not go away or gets worse.  You have a fever.  Your pain is felt only in portions of the abdomen. The right side could possibly be appendicitis. The left lower portion of the abdomen could be colitis or diverticulitis.  You are passing blood in your stools (bright red or black tarry stools, with or without vomiting).  You have blood in your urine.  You develop chills, with or without a fever.  You pass out. MAKE SURE YOU:   Understand these instructions.  Will watch your condition.  Will get help right away if you are not doing well or get worse. Document Released: 07/12/2007 Document Revised: 12/07/2011 Document Reviewed: 08/01/2009 [facility] Patient Information 2014 [place], [place]. Your CT scan, shows that you have a stable hiatal hernia, and no other pathology to explain your pain, you do not appear to have a urinary tract infection, nor intra-abdominal abscess, or infection.  Please make an appointment with your primary care physician for further followup

## 2013-12-02 NOTE — ED Notes (Signed)
Pt st's pain was better after pain med. But now is returning.  Josh G. PA made aware of same.

## 2013-12-02 NOTE — ED Provider Notes (Signed)
CSN: [id number]     Arrival date & time 12/02/13  1627 History   First MD Initiated Contact with Patient 12/02/13 1636     Chief Complaint  Patient presents with  . Abdominal Pain     (Consider location/radiation/quality/duration/timing/severity/associated sxs/prior Treatment) HPI Comments: Patients with history of appendectomy, cholecystectomy, opioid dependence -- presents with complaint of right-sided abdominal pain which began today. Pain is described as cramping, sharp. It is nonradiating. Patient states that she began having watery, nonbloody diarrhea yesterday. She had this approximately 5 times. No nausea or vomiting. She's currently on her second course of antibiotics for pneumonia/bronchitis. Patient was seen in emergency Department 4 days ago and had a negative workup for a PE. She denies recent fever. She's taken Tylenol at home for pain. Onset of symptoms gradual. Course is constant.  Patient is a 38 y.o. female presenting with abdominal pain. The history is provided by the patient.  Abdominal Pain Associated symptoms: cough and diarrhea   Associated symptoms: no chest pain, no dysuria, no fever, no hematuria, no nausea, no sore throat and no vomiting     Past Medical History  Diagnosis Date  . Bipolar 1 disorder   . Migraines   . Wears dentures     upper denture  . Adenotonsillar hypertrophy 03/2012    snores during sleep; denies apnea; states occ. wakes up coughing  . COPD (chronic obstructive pulmonary disease)   . Anemia   . Depression   . Anxiety   . Obesity   . Hypercholesterolemia   . Pneumonia    Past Surgical History  Procedure Laterality Date  . Cesarean section  08/31/2001    total of  3  . Appendectomy    . Cholecystectomy    . Tubal ligation  08/31/2001  . Dilation and evacuation  04/09/2000  . Tonsillectomy and adenoidectomy  04/12/2012    Procedure: TONSILLECTOMY AND ADENOIDECTOMY;  Surgeon: Darletta Moll, MD;  Location: [facility];   Service: ENT;  Laterality: Bilateral;  . Tonsillectomy     History reviewed. No pertinent family history. History  Substance Use Topics  . Smoking status: Current Every Day Smoker -- 0.33 packs/day    Types: Cigarettes  . Smokeless tobacco: Never Used     Comment: quit smoking 11/2011  . Alcohol Use: No   OB History   Grav Para Term Preterm Abortions TAB SAB Ect Mult Living                 Review of Systems  Constitutional: Negative for fever.  HENT: Negative for rhinorrhea and sore throat.   Eyes: Negative for redness.  Respiratory: Positive for cough.   Cardiovascular: Negative for chest pain.  Gastrointestinal: Positive for abdominal pain and diarrhea. Negative for nausea, vomiting and blood in stool.  Genitourinary: Negative for dysuria, hematuria and flank pain.  Musculoskeletal: Negative for myalgias.  Skin: Negative for rash.  Neurological: Negative for headaches.      Allergies  Penicillins  Home Medications   Current Outpatient Rx  Name  Route  Sig  Dispense  Refill  . albuterol (PROVENTIL HFA;VENTOLIN HFA) 108 (90 BASE) MCG/ACT inhaler   Inhalation   Inhale 2 puffs into the lungs every 8 (eight) hours as needed for wheezing or shortness of breath.         . butalbital-acetaminophen-caffeine (FIORICET, ESGIC) 50-325-40 MG per tablet   Oral   Take 1 tablet by mouth 4 (four) times daily as needed for headache.          Marland Kitchen  chlorpheniramine-HYDROcodone (TUSSIONEX PENNKINETIC ER) 10-8 MG/5ML LQCR   Oral   Take 5 mLs by mouth every 12 (twelve) hours as needed for cough.   115 mL   0   . cholecalciferol (VITAMIN D) 1000 UNITS tablet   Oral   Take 5 tablets (5,000 Units total) by mouth daily.   150 tablet   0   . diazepam (VALIUM) 5 MG tablet   Oral   Take 5 mg by mouth 2 (two) times daily as needed for anxiety.         Marland Kitchen. doxycycline (VIBRAMYCIN) 100 MG capsule   Oral   Take 1 capsule (100 mg total) by mouth 2 (two) times daily.   20 capsule    0   . FLUoxetine (PROZAC) 20 MG capsule   Oral   Take 1 capsule (20 mg total) by mouth daily.   30 capsule   0   . gabapentin (NEURONTIN) 300 MG capsule   Oral   Take 300 mg by mouth 3 (three) times daily.         . hydrOXYzine (ATARAX/VISTARIL) 25 MG tablet   Oral   Take 1 tablet (25 mg total) by mouth every 6 (six) hours as needed for anxiety.   30 tablet   0   . iron polysaccharides (NIFEREX) 150 MG capsule   Oral   Take 1 capsule (150 mg total) by mouth 2 (two) times daily.   60 capsule   0   . lamoTRIgine (LAMICTAL) 200 MG tablet   Oral   Take 1 tablet (200 mg total) by mouth every morning.   30 tablet   0   . Multiple Vitamin (MULTIVITAMIN WITH MINERALS) TABS tablet   Oral   Take 1 tablet by mouth daily.         Marland Kitchen. OLANZapine (ZYPREXA) 5 MG tablet   Oral   Take 1 tablet (5 mg total) by mouth at bedtime.   30 tablet   0   . predniSONE (DELTASONE) 20 MG tablet   Oral   Take 3 tablets (60 mg total) by mouth daily with breakfast.   15 tablet   0   . Probiotic Product (ACIDOPHILUS PROBIOTIC BLEND) CAPS   Oral   Take 2 capsules by mouth 2 (two) times daily.   60 capsule   0   . promethazine (PHENERGAN) 25 MG tablet   Oral   Take 1 tablet (25 mg total) by mouth every 6 (six) hours as needed for nausea or vomiting.   12 tablet   0   . rizatriptan (MAXALT-MLT) 10 MG disintegrating tablet   Oral   Take 10 mg by mouth as needed for migraine. May repeat in 2 hours if needed         . tiotropium (SPIRIVA) 18 MCG inhalation capsule   Inhalation   Place 1 capsule (18 mcg total) into inhaler and inhale daily.   30 capsule   0   . traZODone (DESYREL) 50 MG tablet   Oral   Take 1 tablet (50 mg total) by mouth at bedtime as needed and may repeat dose one time if needed for sleep.   30 tablet   0    BP 123/71  Pulse 101  Temp(Src) 99 F (37.2 C) (Oral)  Resp 18  Ht 5\' 2"  (1.575 m)  Wt 215 lb (97.523 kg)  BMI 39.31 kg/m2  SpO2 93%  LMP  11/14/2013 Physical Exam  Nursing note and vitals reviewed. Constitutional: She  appears well-developed and well-nourished.  HENT:  Head: Normocephalic and atraumatic.  Eyes: Conjunctivae are normal. Right eye exhibits no discharge. Left eye exhibits no discharge.  Neck: Normal range of motion. Neck supple.  Cardiovascular: Normal rate, regular rhythm and normal heart sounds.   Pulmonary/Chest: Effort normal and breath sounds normal.  Abdominal: Soft. Bowel sounds are normal. There is tenderness. There is no rigidity, no rebound, no guarding, no CVA tenderness, no tenderness at McBurney's point and negative Murphy's sign.    Neurological: She is alert.  Skin: Skin is warm and dry.  Psychiatric: She has a normal mood and affect.    ED Course  Procedures (including critical care time) Labs Review Labs Reviewed  COMPREHENSIVE METABOLIC PANEL - Abnormal; Notable for the following:    Glucose, Bld 219 (*)    Albumin 3.4 (*)    Total Bilirubin <0.2 (*)    All other components within normal limits  CBC WITH DIFFERENTIAL - Abnormal; Notable for the following:    WBC 16.0 (*)    Hemoglobin 11.6 (*)    HCT 35.5 (*)    RDW 15.7 (*)    Platelets 404 (*)    Neutrophils Relative % 87 (*)    Neutro Abs 14.0 (*)    Lymphocytes Relative 10 (*)    Monocytes Relative 2 (*)    All other components within normal limits  URINALYSIS, ROUTINE W REFLEX MICROSCOPIC - Abnormal; Notable for the following:    Specific Gravity, Urine 1.034 (*)    Glucose, UA 500 (*)    All other components within normal limits  LIPASE, BLOOD  POC URINE PREG, ED   Imaging Review No results found.   EKG Interpretation None      5:05 PM Patient seen and examined. Work-up initiated. Medications ordered.   Vital signs reviewed and are as follows: Filed Vitals:   12/02/13 1634  BP: 123/71  Pulse: 101  Temp: 99 F (37.2 C)  Resp: 18   8:27 PM Patient did receive some relief with pain medication early,  however she is now on 3rd dose dilaudid, continuing to have 9/10 pain with right-sided abd pain. She appears uncomfortable. Discussed option of CT to eval for complication of colitis. Patient elects to proceed.    Handoff to [facility] NP at shift change. CT ordered due to elevated WBC count with persistent severe pain. Patient has history of opioid dependence.     MDM   Final diagnoses:  Abdominal pain   Pending CT.     Renne Crigler, PA-C 12/02/13 2037

## 2013-12-04 NOTE — ED Provider Notes (Signed)
Medical screening examination/treatment/procedure(s) were performed by non-physician practitioner and as supervising physician I was immediately available for consultation/collaboration.  Flint MelterElliott L Katherene Dinino, MD 12/04/13 [phone]

## 2013-12-04 NOTE — ED Provider Notes (Signed)
[place]  Flint MelterElliott L Braden Deloach, MD 12/04/13 [id number]

## 2013-12-14 ENCOUNTER — Encounter (HOSPITAL_COMMUNITY): Payer: Self-pay | Admitting: Emergency Medicine

## 2013-12-14 ENCOUNTER — Emergency Department (HOSPITAL_COMMUNITY)
Admission: EM | Admit: 2013-12-14 | Discharge: 2013-12-14 | Disposition: A | Discharge status: Home / Self Care | Payer: Medicaid Other | Attending: Emergency Medicine | Admitting: Emergency Medicine

## 2013-12-14 DIAGNOSIS — F17200 Nicotine dependence, unspecified, uncomplicated: Secondary | ICD-10-CM | POA: Insufficient documentation

## 2013-12-14 DIAGNOSIS — N920 Excessive and frequent menstruation with regular cycle: Secondary | ICD-10-CM | POA: Insufficient documentation

## 2013-12-14 DIAGNOSIS — J449 Chronic obstructive pulmonary disease, unspecified: Secondary | ICD-10-CM | POA: Insufficient documentation

## 2013-12-14 DIAGNOSIS — Z9089 Acquired absence of other organs: Secondary | ICD-10-CM | POA: Insufficient documentation

## 2013-12-14 DIAGNOSIS — Z8719 Personal history of other diseases of the digestive system: Secondary | ICD-10-CM | POA: Insufficient documentation

## 2013-12-14 DIAGNOSIS — F319 Bipolar disorder, unspecified: Secondary | ICD-10-CM | POA: Insufficient documentation

## 2013-12-14 DIAGNOSIS — G43909 Migraine, unspecified, not intractable, without status migrainosus: Secondary | ICD-10-CM | POA: Insufficient documentation

## 2013-12-14 DIAGNOSIS — Z79899 Other long term (current) drug therapy: Secondary | ICD-10-CM | POA: Insufficient documentation

## 2013-12-14 DIAGNOSIS — Z792 Long term (current) use of antibiotics: Secondary | ICD-10-CM | POA: Insufficient documentation

## 2013-12-14 DIAGNOSIS — E7800 Pure hypercholesterolemia, unspecified: Secondary | ICD-10-CM | POA: Insufficient documentation

## 2013-12-14 DIAGNOSIS — Z3202 Encounter for pregnancy test, result negative: Secondary | ICD-10-CM | POA: Insufficient documentation

## 2013-12-14 DIAGNOSIS — Z8701 Personal history of pneumonia (recurrent): Secondary | ICD-10-CM | POA: Insufficient documentation

## 2013-12-14 DIAGNOSIS — Z880 Allergy status to penicillin: Secondary | ICD-10-CM | POA: Insufficient documentation

## 2013-12-14 DIAGNOSIS — Z98811 Dental restoration status: Secondary | ICD-10-CM | POA: Insufficient documentation

## 2013-12-14 DIAGNOSIS — J4489 Other specified chronic obstructive pulmonary disease: Secondary | ICD-10-CM | POA: Insufficient documentation

## 2013-12-14 DIAGNOSIS — Z9851 Tubal ligation status: Secondary | ICD-10-CM | POA: Insufficient documentation

## 2013-12-14 DIAGNOSIS — F411 Generalized anxiety disorder: Secondary | ICD-10-CM | POA: Insufficient documentation

## 2013-12-14 DIAGNOSIS — N926 Irregular menstruation, unspecified: Secondary | ICD-10-CM | POA: Insufficient documentation

## 2013-12-14 DIAGNOSIS — F329 Major depressive disorder, single episode, unspecified: Secondary | ICD-10-CM | POA: Insufficient documentation

## 2013-12-14 DIAGNOSIS — E669 Obesity, unspecified: Secondary | ICD-10-CM | POA: Insufficient documentation

## 2013-12-14 DIAGNOSIS — F3289 Other specified depressive episodes: Secondary | ICD-10-CM | POA: Insufficient documentation

## 2013-12-14 LAB — CBC WITH DIFFERENTIAL/PLATELET
Basophils Absolute: 0 K/uL (ref 0.0–0.1)
Basophils Relative: 0 % (ref 0–1)
Eosinophils Absolute: 0.1 K/uL (ref 0.0–0.7)
Eosinophils Relative: 1 % (ref 0–5)
HCT: 37.8 % (ref 36.0–46.0)
Hemoglobin: 12.6 g/dL (ref 12.0–15.0)
Lymphocytes Relative: 33 % (ref 12–46)
Lymphs Abs: 3.5 K/uL (ref 0.7–4.0)
MCH: 26.4 pg (ref 26.0–34.0)
MCHC: 33.3 g/dL (ref 30.0–36.0)
MCV: 79.1 fL (ref 78.0–100.0)
Monocytes Absolute: 0.5 K/uL (ref 0.1–1.0)
Monocytes Relative: 5 % (ref 3–12)
Neutro Abs: 6.4 K/uL (ref 1.7–7.7)
Neutrophils Relative %: 61 % (ref 43–77)
Platelets: 344 K/uL (ref 150–400)
RBC: 4.78 MIL/uL (ref 3.87–5.11)
RDW: 15.5 % (ref 11.5–15.5)
WBC: 10.6 K/uL — ABNORMAL HIGH (ref 4.0–10.5)

## 2013-12-14 LAB — COMPREHENSIVE METABOLIC PANEL WITH GFR
ALT: 11 U/L (ref 0–35)
AST: 14 U/L (ref 0–37)
Albumin: 3.5 g/dL (ref 3.5–5.2)
Alkaline Phosphatase: 79 U/L (ref 39–117)
BUN: 13 mg/dL (ref 6–23)
CO2: 23 meq/L (ref 19–32)
Calcium: 9.6 mg/dL (ref 8.4–10.5)
Chloride: 103 meq/L (ref 96–112)
Creatinine, Ser: 0.75 mg/dL (ref 0.50–1.10)
GFR calc Af Amer: >90 mL/min (ref >90)
GFR calc non Af Amer: >90 mL/min (ref >90)
Glucose, Bld: 119 mg/dL — ABNORMAL HIGH (ref 70–99)
Potassium: 4.6 meq/L (ref 3.7–5.3)
Sodium: 138 meq/L (ref 137–147)
Total Bilirubin: <0.2 mg/dL — ABNORMAL LOW (ref 0.3–1.2)
Total Protein: 6.7 g/dL (ref 6.0–8.3)

## 2013-12-14 LAB — URINALYSIS, ROUTINE W REFLEX MICROSCOPIC
Bilirubin Urine: NEGATIVE
Glucose, UA: NEGATIVE mg/dL
Ketones, ur: NEGATIVE mg/dL
Leukocytes, UA: NEGATIVE
Nitrite: NEGATIVE
Protein, ur: NEGATIVE mg/dL
Specific Gravity, Urine: 1.023 (ref 1.005–1.030)
Urobilinogen, UA: 0.2 mg/dL (ref 0.0–1.0)
pH: 6 (ref 5.0–8.0)

## 2013-12-14 LAB — POC URINE PREG, ED: Preg Test, Ur: NEGATIVE

## 2013-12-14 LAB — URINE MICROSCOPIC-ADD ON

## 2013-12-14 MED ORDER — OXYCODONE-ACETAMINOPHEN 5-325 MG PO TABS: 1.0000 | ORAL_TABLET | Freq: Three times a day (TID) | ORAL | Status: DC | PRN

## 2013-12-14 MED ORDER — SODIUM CHLORIDE 0.9 % IV BOLUS (SEPSIS)
500.0000 mL | Freq: Once | INTRAVENOUS | Status: AC
  Administered 2013-12-14: 500 mL via INTRAVENOUS

## 2013-12-14 MED ORDER — ONDANSETRON HCL 4 MG/2ML IJ SOLN
4.0000 mg | Freq: Once | INTRAMUSCULAR | Status: AC
  Administered 2013-12-14: 4 mg via INTRAVENOUS
  Filled 2013-12-14: qty 2

## 2013-12-14 MED ORDER — MORPHINE SULFATE 4 MG/ML IJ SOLN
4.0000 mg | Freq: Once | INTRAMUSCULAR | Status: AC
  Administered 2013-12-14: 4 mg via INTRAVENOUS
  Filled 2013-12-14: qty 1

## 2013-12-14 NOTE — Discharge Instructions (Signed)
Menorrhagia  Menorrhagia is the medical term for when your menstrual periods are heavy or last longer than usual. With menorrhagia, every period you have may cause enough blood loss and cramping that you are unable to maintain your usual activities.  CAUSES   In some cases, the cause of heavy periods is unknown, but a number of conditions may cause menorrhagia. Common causes include:  · A problem with the hormone-producing thyroid gland (hypothyroid).  · Noncancerous growths in the uterus (polyps or fibroids).  · An imbalance of the estrogen and progesterone hormones.  · One of your ovaries not releasing an egg during one or more months.  · Side effects of having an intrauterine device (IUD).  · Side effects of some medicines, such as anti-inflammatory medicines or blood thinners.  · A bleeding disorder that stops your blood from clotting normally.  SIGNS AND SYMPTOMS   During a normal period, bleeding lasts between 4 and 8 days. Signs that your periods are too heavy include:  · You routinely have to change your pad or tampon every 1 or 2 hours because it is completely soaked.  · You pass blood clots larger than 1 inch (2.5 cm) in size.  · You have bleeding for more than 7 days.  · You need to use pads and tampons at the same time because of heavy bleeding.  · You need to wake up to change your pads or tampons during the night.  · You have symptoms of anemia, such as tiredness, fatigue, or shortness of breath.   DIAGNOSIS   Your health care provider will perform a physical exam and ask you questions about your symptoms and menstrual history. Other tests may be ordered based on what the health care provider finds during the exam. These tests can include:  · Blood tests To check if you are pregnant or have hormonal changes, a bleeding or thyroid disorder, low iron levels (anemia), or other problems.  · Endometrial biopsy Your health care provider takes a sample of tissue from the inside of your uterus to be examined  under a microscope.  · Pelvic ultrasound This test uses sound waves to make a picture of your uterus, ovaries, and vagina. The pictures can show if you have fibroids or other growths.  · Hysteroscopy For this test, your health care provider will use a small telescope to look inside your uterus.  Based on the results of your initial tests, your health care provider may recommend further testing.  TREATMENT   Treatment may not be needed. If it is needed, your health care provider may recommend treatment with one or more medicines first. If these do not reduce bleeding enough, a surgical treatment might be an option. The best treatment for you will depend on:   · Whether you need to prevent pregnancy.    · Your desire to have children in the future.  · The cause and severity of your bleeding.  · Your opinion and personal preference.    Medicines for menorrhagia may include:  · Birth control methods that use hormones These include the pill, skin patch, vaginal ring, shots that you get every 3 months, hormonal IUD, and implant. These treatments reduce bleeding during your menstrual period.  · Medicines that thicken blood and slow bleeding.  · Medicines that reduce swelling, such as ibuprofen.   · Medicines that contain a synthetic hormone called progestin.    · Medicines that make the ovaries stop working for a short time.    You may need surgical   treatment for menorrhagia if the medicines are unsuccessful. Treatment options include:  · Dilation and curettage (D&C) In this procedure, your health care provider opens (dilates) your cervix and then scrapes or suctions tissue from the lining of your uterus to reduce menstrual bleeding.  · Operative hysteroscopy This procedure uses a tiny tube with a light (hysteroscope) to view your uterine cavity and can help in the surgical removal of a polyp that may be causing heavy periods.  · Endometrial ablation Through various techniques, your health care provider permanently  destroys the entire lining of your uterus (endometrium). After endometrial ablation, most women have little or no menstrual flow. Endometrial ablation reduces your ability to become pregnant.  · Endometrial resection This surgical procedure uses an electrosurgical wire loop to remove the lining of the uterus. This procedure also reduces your ability to become pregnant.  · Hysterectomy Surgical removal of the uterus and cervix is a permanent procedure that stops menstrual periods. Pregnancy is not possible after a hysterectomy. This procedure requires anesthesia and hospitalization.  HOME CARE INSTRUCTIONS   · Only take over-the-counter or prescription medicines as directed by your health care provider. Take prescribed medicines exactly as directed. Do not change or switch medicines without consulting your health care provider.  · Take any prescribed iron pills exactly as directed by your health care provider. Long-term heavy bleeding may result in low iron levels. Iron pills help replace the iron your body lost from heavy bleeding. Iron may cause constipation. If this becomes a problem, increase the bran, fruits, and roughage in your diet.  · Do not take aspirin or medicines that contain aspirin 1 week before or during your menstrual period. Aspirin may make the bleeding worse.  · If you need to change your sanitary pad or tampon more than once every 2 hours, stay in bed and rest as much as possible until the bleeding stops.  · Eat well-balanced meals. Eat foods high in iron. Examples are leafy green vegetables, meat, liver, eggs, and whole grain breads and cereals. Do not try to lose weight until the abnormal bleeding has stopped and your blood iron level is back to normal.  SEEK MEDICAL CARE IF:   · You soak through a pad or tampon every 1 or 2 hours, and this happens every time you have a period.  · You need to use pads and tampons at the same time because you are bleeding so much.  · You need to change your pad  or tampon during the night.  · You have a period that lasts for more than 8 days.  · You pass clots bigger than 1 inch wide.  · You have irregular periods that happen more or less often than once a month.  · You feel dizzy or faint.  · You feel very weak or tired.  · You feel short of breath or feel your heart is beating too fast when you exercise.  · You have nausea and vomiting or diarrhea while you are taking your medicine.  · You have any problems that may be related to the medicine you are taking.  SEEK IMMEDIATE MEDICAL CARE IF:   · You soak through 4 or more pads or tampons in 2 hours.  · You have any bleeding while you are pregnant.  MAKE SURE YOU:   · Understand these instructions.  · Will watch your condition.  · Will get help right away if you are not doing well or get worse.    Document Released: 09/14/2005 Document Revised: 07/05/2013 Document Reviewed: 03/05/2013  Exit[employer]® Patient Information ©2014 [employer].

## 2013-12-14 NOTE — ED Notes (Signed)
Pt st's she had a normal period 2 weeks ago.  Started having vag bleeding with cramps 2 days ago.  [place] and was told to come to ED if bleeding or pain became worse.  Pt st's bleeding has slowed but cramping and nausea is worse.

## 2013-12-14 NOTE — ED Provider Notes (Signed)
CSN: [id number]     Arrival date & time 12/14/13  1610 History   First MD Initiated Contact with Patient 12/14/13 2002     Chief Complaint  Patient presents with  . Vaginal Bleeding     (Consider location/radiation/quality/duration/timing/severity/associated sxs/prior Treatment) Patient is a 38 y.o. female presenting with vaginal bleeding. The history is provided by the patient. No language interpreter was used.  Vaginal Bleeding Quality:  Heavier than menses Severity:  Moderate Onset quality:  Sudden Progression:  Improving Chronicity:  New Menstrual history:  Irregular Possible pregnancy: no   Associated symptoms: abdominal pain   Patient was evaluated at [facility] on 12/12/13 for heavy vaginal bleeding.  Patient underwent exam including transvaginal ultrasound, speculum exam, pregnancy evaluation.  Discharged home with provera 10 mg bid with follow-up scheduled with OB Sheryn Bison) on 12/18/13 for d&c and endometrial biopsy.  Patient states bleeding has slowed with the provera use, but she is continuing to have severe pelvic cramping with nausea.  Past Medical History  Diagnosis Date  . Bipolar 1 disorder   . Migraines   . Wears dentures     upper denture  . Adenotonsillar hypertrophy 03/2012    snores during sleep; denies apnea; states occ. wakes up coughing  . COPD (chronic obstructive pulmonary disease)   . Anemia   . Depression   . Anxiety   . Obesity   . Hypercholesterolemia   . Pneumonia   . Pancreatitis    Past Surgical History  Procedure Laterality Date  . Cesarean section  08/31/2001    total of  3  . Appendectomy    . Cholecystectomy    . Tubal ligation  08/31/2001  . Dilation and evacuation  04/09/2000  . Tonsillectomy and adenoidectomy  04/12/2012    Procedure: TONSILLECTOMY AND ADENOIDECTOMY;  Surgeon: Darletta Moll, MD;  Location: [facility];  Service: ENT;  Laterality: Bilateral;  . Tonsillectomy     No family history on  file. History  Substance Use Topics  . Smoking status: Current Every Day Smoker -- 0.33 packs/day    Types: Cigarettes  . Smokeless tobacco: Never Used     Comment: quit smoking 11/2011  . Alcohol Use: No   OB History   Grav Para Term Preterm Abortions TAB SAB Ect Mult Living                 Review of Systems  Gastrointestinal: Positive for abdominal pain.  Genitourinary: Positive for vaginal bleeding and pelvic pain.  All other systems reviewed and are negative.      Allergies  Penicillins  Home Medications   Current Outpatient Rx  Name  Route  Sig  Dispense  Refill  . albuterol (PROVENTIL HFA;VENTOLIN HFA) 108 (90 BASE) MCG/ACT inhaler   Inhalation   Inhale 2 puffs into the lungs every 8 (eight) hours as needed for wheezing or shortness of breath.         . butalbital-acetaminophen-caffeine (FIORICET, ESGIC) 50-325-40 MG per tablet   Oral   Take 1 tablet by mouth 4 (four) times daily as needed for headache.          . cholecalciferol (VITAMIN D) 1000 UNITS tablet   Oral   Take 5 tablets (5,000 Units total) by mouth daily.   150 tablet   0   . diazepam (VALIUM) 5 MG tablet   Oral   Take 5 mg by mouth 2 (two) times daily as needed for anxiety.         Marland Kitchen  doxycycline (VIBRAMYCIN) 100 MG capsule   Oral   Take 1 capsule (100 mg total) by mouth 2 (two) times daily.   20 capsule   0   . FLUoxetine (PROZAC) 20 MG capsule   Oral   Take 1 capsule (20 mg total) by mouth daily.   30 capsule   0   . gabapentin (NEURONTIN) 300 MG capsule   Oral   Take 300 mg by mouth 3 (three) times daily.         Marland Kitchen. HYDROcodone-acetaminophen (NORCO/VICODIN) 5-325 MG per tablet   Oral   Take 1 tablet by mouth every 6 (six) hours as needed.   9 tablet   0   . hydrOXYzine (ATARAX/VISTARIL) 25 MG tablet   Oral   Take 1 tablet (25 mg total) by mouth every 6 (six) hours as needed for anxiety.   30 tablet   0   . iron polysaccharides (NIFEREX) 150 MG capsule   Oral    Take 1 capsule (150 mg total) by mouth 2 (two) times daily.   60 capsule   0   . lamoTRIgine (LAMICTAL) 200 MG tablet   Oral   Take 1 tablet (200 mg total) by mouth every morning.   30 tablet   0   . medroxyPROGESTERone (PROVERA) 10 MG tablet   Oral   Take 10 mg by mouth 2 (two) times daily.         . Multiple Vitamin (MULTIVITAMIN WITH MINERALS) TABS tablet   Oral   Take 1 tablet by mouth daily.         Marland Kitchen. OLANZapine (ZYPREXA) 5 MG tablet   Oral   Take 1 tablet (5 mg total) by mouth at bedtime.   30 tablet   0   . Probiotic Product (ACIDOPHILUS PROBIOTIC BLEND) CAPS   Oral   Take 2 capsules by mouth 2 (two) times daily.   60 capsule   0   . promethazine (PHENERGAN) 25 MG tablet   Oral   Take 1 tablet (25 mg total) by mouth every 6 (six) hours as needed for nausea or vomiting.   12 tablet   0   . rizatriptan (MAXALT-MLT) 10 MG disintegrating tablet   Oral   Take 10 mg by mouth as needed for migraine. May repeat in 2 hours if needed         . tiotropium (SPIRIVA) 18 MCG inhalation capsule   Inhalation   Place 1 capsule (18 mcg total) into inhaler and inhale daily.   30 capsule   0   . traZODone (DESYREL) 50 MG tablet   Oral   Take 1 tablet (50 mg total) by mouth at bedtime as needed and may repeat dose one time if needed for sleep.   30 tablet   0    BP 113/79  Pulse 85  Temp(Src) 99 F (37.2 C) (Oral)  Resp 18  Ht 5\' 2"  (1.575 m)  Wt 216 lb (97.977 kg)  BMI 39.50 kg/m2  SpO2 99%  LMP 12/14/2013 Physical Exam  Nursing note and vitals reviewed. Constitutional: She is oriented to person, place, and time. She appears well-developed and well-nourished.  HENT:  Head: Normocephalic.  Eyes: Pupils are equal, round, and reactive to light.  Neck: Normal range of motion.  Cardiovascular: Normal rate and regular rhythm.   Pulmonary/Chest: Effort normal and breath sounds normal.  Abdominal: Soft.    Musculoskeletal: She exhibits no edema and no  tenderness.  Lymphadenopathy:  She has no cervical adenopathy.  Neurological: She is alert and oriented to person, place, and time.  Skin: Skin is warm and dry.  Psychiatric: She has a normal mood and affect. Her behavior is normal. Thought content normal.    ED Course  Procedures (including critical care time) Labs Review Labs Reviewed  CBC WITH DIFFERENTIAL - Abnormal; Notable for the following:    WBC 10.6 (*)    All other components within normal limits  COMPREHENSIVE METABOLIC PANEL - Abnormal; Notable for the following:    Glucose, Bld 119 (*)    Total Bilirubin <0.2 (*)    All other components within normal limits  URINALYSIS, ROUTINE W REFLEX MICROSCOPIC - Abnormal; Notable for the following:    APPearance CLOUDY (*)    Hgb urine dipstick LARGE (*)    All other components within normal limits  URINE MICROSCOPIC-ADD ON  POC URINE PREG, ED   Imaging Review No results found.   EKG Interpretation None     Discussed with Dr. Oletta Lamas.  Minimal bleeding now, primary complaint is nausea and lower abdominal cramping.  Not pregnant.  No anemia. Rx for pain medication provided. Patient has outpatient follow-up scheduled for Monday, 12/18/13 MDM   Final diagnoses:  None    Menorrhagia.     Jimmye Norman, NP 12/15/13 [phone]

## 2013-12-23 NOTE — ED Provider Notes (Signed)
Medical screening examination/treatment/procedure(s) were performed by non-physician practitioner and as supervising physician I was immediately available for consultation/collaboration.   EKG Interpretation None        Gavin PoundMichael Y. Oletta LamasGhim, MD 12/23/13 [id number]

## 2014-01-10 ENCOUNTER — Emergency Department (HOSPITAL_COMMUNITY): Payer: 59

## 2014-01-10 ENCOUNTER — Encounter (HOSPITAL_COMMUNITY): Payer: Self-pay | Admitting: Emergency Medicine

## 2014-01-10 ENCOUNTER — Emergency Department (HOSPITAL_COMMUNITY)
Admission: EM | Admit: 2014-01-10 | Discharge: 2014-01-10 | Disposition: A | Discharge status: Home / Self Care | Payer: 59 | Attending: Emergency Medicine | Admitting: Emergency Medicine

## 2014-01-10 DIAGNOSIS — Z79899 Other long term (current) drug therapy: Secondary | ICD-10-CM | POA: Insufficient documentation

## 2014-01-10 DIAGNOSIS — F3130 Bipolar disorder, current episode depressed, mild or moderate severity, unspecified: Secondary | ICD-10-CM | POA: Insufficient documentation

## 2014-01-10 DIAGNOSIS — D649 Anemia, unspecified: Secondary | ICD-10-CM | POA: Insufficient documentation

## 2014-01-10 DIAGNOSIS — J4489 Other specified chronic obstructive pulmonary disease: Secondary | ICD-10-CM | POA: Insufficient documentation

## 2014-01-10 DIAGNOSIS — G43909 Migraine, unspecified, not intractable, without status migrainosus: Secondary | ICD-10-CM | POA: Insufficient documentation

## 2014-01-10 DIAGNOSIS — Z880 Allergy status to penicillin: Secondary | ICD-10-CM | POA: Insufficient documentation

## 2014-01-10 DIAGNOSIS — F17200 Nicotine dependence, unspecified, uncomplicated: Secondary | ICD-10-CM | POA: Insufficient documentation

## 2014-01-10 DIAGNOSIS — J449 Chronic obstructive pulmonary disease, unspecified: Secondary | ICD-10-CM | POA: Insufficient documentation

## 2014-01-10 DIAGNOSIS — Z8701 Personal history of pneumonia (recurrent): Secondary | ICD-10-CM | POA: Insufficient documentation

## 2014-01-10 DIAGNOSIS — Z8719 Personal history of other diseases of the digestive system: Secondary | ICD-10-CM | POA: Insufficient documentation

## 2014-01-10 DIAGNOSIS — S8392XA Sprain of unspecified site of left knee, initial encounter: Secondary | ICD-10-CM

## 2014-01-10 DIAGNOSIS — W010XXA Fall on same level from slipping, tripping and stumbling without subsequent striking against object, initial encounter: Secondary | ICD-10-CM | POA: Insufficient documentation

## 2014-01-10 DIAGNOSIS — F411 Generalized anxiety disorder: Secondary | ICD-10-CM | POA: Insufficient documentation

## 2014-01-10 DIAGNOSIS — Y929 Unspecified place or not applicable: Secondary | ICD-10-CM | POA: Insufficient documentation

## 2014-01-10 DIAGNOSIS — R269 Unspecified abnormalities of gait and mobility: Secondary | ICD-10-CM | POA: Insufficient documentation

## 2014-01-10 DIAGNOSIS — S93409A Sprain of unspecified ligament of unspecified ankle, initial encounter: Secondary | ICD-10-CM | POA: Insufficient documentation

## 2014-01-10 DIAGNOSIS — Y939 Activity, unspecified: Secondary | ICD-10-CM | POA: Insufficient documentation

## 2014-01-10 DIAGNOSIS — IMO0002 Reserved for concepts with insufficient information to code with codable children: Secondary | ICD-10-CM | POA: Insufficient documentation

## 2014-01-10 DIAGNOSIS — S93402A Sprain of unspecified ligament of left ankle, initial encounter: Secondary | ICD-10-CM

## 2014-01-10 DIAGNOSIS — S76011A Strain of muscle, fascia and tendon of right hip, initial encounter: Secondary | ICD-10-CM

## 2014-01-10 DIAGNOSIS — E669 Obesity, unspecified: Secondary | ICD-10-CM | POA: Insufficient documentation

## 2014-01-10 DIAGNOSIS — Z792 Long term (current) use of antibiotics: Secondary | ICD-10-CM | POA: Insufficient documentation

## 2014-01-10 MED ORDER — OXYCODONE-ACETAMINOPHEN 5-325 MG PO TABS
1.0000 | ORAL_TABLET | Freq: Once | ORAL | Status: AC
  Administered 2014-01-10: 1 via ORAL
  Filled 2014-01-10: qty 1

## 2014-01-10 MED ORDER — HYDROCODONE-ACETAMINOPHEN 5-325 MG PO TABS
1.0000 | ORAL_TABLET | ORAL | Status: DC | PRN
Start: 2014-01-10 — End: 2014-02-09

## 2014-01-10 MED ORDER — KETOROLAC TROMETHAMINE 60 MG/2ML IM SOLN
60.0000 mg | Freq: Once | INTRAMUSCULAR | Status: AC
  Administered 2014-01-10: 60 mg via INTRAMUSCULAR
  Filled 2014-01-10: qty 2

## 2014-01-10 MED ORDER — IBUPROFEN 600 MG PO TABS: 600.0000 mg | ORAL_TABLET | Freq: Four times a day (QID) | ORAL | Status: DC | PRN

## 2014-01-10 NOTE — ED Notes (Signed)
Pt reports falling yesterday while on her porch. Pt denies hitting her head or having a LOC. Pt denies pain at the time of the injury, however later in the evening her left knee and ankle had swelling and was painful. Pt states that she elevated her leg, which relieved the swelling, however is still painful. Pt states that she woke up this morning and her right hip is hurting. Pt is A/O x4, in NAD, and vitals are WDL.

## 2014-01-10 NOTE — Discharge Instructions (Signed)
Ice, elevate. Ibuprofen for pain. norco for severe pain. Follow up with your doctor if not improving. Return if symptoms are worsening.    Ankle Sprain An ankle sprain is an injury to the strong, fibrous tissues (ligaments) that hold the bones of your ankle joint together.  CAUSES An ankle sprain is usually caused by a fall or by twisting your ankle. Ankle sprains most commonly occur when you step on the outer edge of your foot, and your ankle turns inward. People who participate in sports are more prone to these types of injuries.  SYMPTOMS   Pain in your ankle. The pain may be present at rest or only when you are trying to stand or walk.  Swelling.  Bruising. Bruising may develop immediately or within 1 to 2 days after your injury.  Difficulty standing or walking, particularly when turning corners or changing directions. DIAGNOSIS  Your caregiver will ask you details about your injury and perform a physical exam of your ankle to determine if you have an ankle sprain. During the physical exam, your caregiver will press on and apply pressure to specific areas of your foot and ankle. Your caregiver will try to move your ankle in certain ways. An X-ray exam may be done to be sure a bone was not broken or a ligament did not separate from one of the bones in your ankle (avulsion fracture).  TREATMENT  Certain types of braces can help stabilize your ankle. Your caregiver can make a recommendation for this. Your caregiver may recommend the use of medicine for pain. If your sprain is severe, your caregiver may refer you to a surgeon who helps to restore function to parts of your skeletal system (orthopedist) or a physical therapist. HOME CARE INSTRUCTIONS   Apply ice to your injury for 1 2 days or as directed by your caregiver. Applying ice helps to reduce inflammation and pain.  Put ice in a plastic bag.  Place a towel between your skin and the bag.  Leave the ice on for 15-20 minutes at a  time, every 2 hours while you are awake.  Only take over-the-counter or prescription medicines for pain, discomfort, or fever as directed by your caregiver.  Elevate your injured ankle above the level of your heart as much as possible for 2 3 days.  If your caregiver recommends crutches, use them as instructed. Gradually put weight on the affected ankle. Continue to use crutches or a cane until you can walk without feeling pain in your ankle.  If you have a plaster splint, wear the splint as directed by your caregiver. Do not rest it on anything harder than a pillow for the first 24 hours. Do not put weight on it. Do not get it wet. You may take it off to take a shower or bath.  You may have been given an elastic bandage to wear around your ankle to provide support. If the elastic bandage is too tight (you have numbness or tingling in your foot or your foot becomes cold and blue), adjust the bandage to make it comfortable.  If you have an air splint, you may blow more air into it or let air out to make it more comfortable. You may take your splint off at night and before taking a shower or bath. Wiggle your toes in the splint several times per day to decrease swelling. SEEK MEDICAL CARE IF:   You have rapidly increasing bruising or swelling.  Your toes feel extremely cold  or you lose feeling in your foot.  Your pain is not relieved with medicine. SEEK IMMEDIATE MEDICAL CARE IF:  Your toes are numb or blue.  You have severe pain that is increasing. MAKE SURE YOU:   Understand these instructions.  Will watch your condition.  Will get help right away if you are not doing well or get worse. Document Released: 09/14/2005 Document Revised: 06/08/2012 Document Reviewed: 09/26/2011 [facility] Patient Information 2014 [place], [place]. Hip Pain The hips join the upper legs to the lower pelvis. The bones, cartilage, tendons, and muscles of the hip joint perform a lot of work each day holding  your body weight and allowing you to move around. Hip pain is a common symptom. It can range from a minor ache to severe pain on 1 or both hips. Pain may be felt on the inside of the hip joint near the groin, or the outside near the buttocks and upper thigh. There may be swelling or stiffness as well. It occurs more often when a person walks or performs activity. There are many reasons hip pain can develop. CAUSES  It is important to work with your caregiver to identify the cause since many conditions can impact the bones, cartilage, muscles, and tendons of the hips. Causes for hip pain include:  Broken (fractured) bones.  Separation of the thighbone from the hip socket (dislocation).  Torn cartilage of the hip joint.  Swelling (inflammation) of a tendon (tendonitis), the sac within the hip joint (bursitis), or a joint.  A weakening in the abdominal wall (hernia), affecting the nerves to the hip.  Arthritis in the hip joint or lining of the hip joint.  Pinched nerves in the back, hip, or upper thigh.  A bulging disc in the spine (herniated disc).  Rarely, bone infection or cancer. DIAGNOSIS  The location of your hip pain will help your caregiver understand what may be causing the pain. A diagnosis is based on your medical history, your symptoms, results from your physical exam, and results from diagnostic tests. Diagnostic tests may include X-ray exams, a computerized magnetic scan (magnetic resonance imaging, MRI), or bone scan. TREATMENT  Treatment will depend on the cause of your hip pain. Treatment may include:  Limiting activities and resting until symptoms improve.  Crutches or other walking supports (a cane or brace).  Ice, elevation, and compression.  Physical therapy or home exercises.  Shoe inserts or special shoes.  Losing weight.  Medications to reduce pain.  Undergoing surgery. HOME CARE INSTRUCTIONS   Only take over-the-counter or prescription medicines for  pain, discomfort, or fever as directed by your caregiver.  Put ice on the injured area:  Put ice in a plastic bag.  Place a towel between your skin and the bag.  Leave the ice on for 15-20 minutes at a time, 03-04 times a day.  Keep your leg raised (elevated) when possible to lessen swelling.  Avoid activities that cause pain.  Follow specific exercises as directed by your caregiver.  Sleep with a pillow between your legs on your most comfortable side.  Record how often you have hip pain, the location of the pain, and what it feels like. This information may be helpful to you and your caregiver.  Ask your caregiver about returning to work or sports and whether you should drive.  Follow up with your caregiver for further exams, therapy, or testing as directed. SEEK MEDICAL CARE IF:   Your pain or swelling continues or worsens after 1  week.  You are feeling unwell or have chills.  You have increasing difficulty with walking.  You have a loss of sensation or other new symptoms.  You have questions or concerns. SEEK IMMEDIATE MEDICAL CARE IF:   You cannot put weight on the affected hip.  You have fallen.  You have a sudden increase in pain and swelling in your hip.  You have a fever. MAKE SURE YOU:   Understand these instructions.  Will watch your condition.  Will get help right away if you are not doing well or get worse. Document Released: 03/04/2010 Document Revised: 12/07/2011 Document Reviewed: 03/04/2010 [facility] Patient Information 2014 [place], [place].

## 2014-01-10 NOTE — ED Provider Notes (Signed)
CSN: [id number]     Arrival date & time 01/10/14  1332 History  This chart was scribed for non-physician practitioner working with Shelby Hatfield, by Tana ConchStephen Methvin ED Scribe. This patient was seen in [facility] and the patient's care was started at 1:54 PM.    Chief Complaint  Patient presents with  . Fall      The history is provided by the patient. No language interpreter was used.    HPI Comments: Shelby Hatfield is a 38 y.o. female who presents to emergency department after a fall yesterday. Patient states she slipped on her porch and fell down both legs split in different directions. Patient states she did not have pain at time of the fall. She states after several hours she developed gradual onset of pain in her left knee and left ankle. She states there was swelling in both left knee and left ankle. She states she elevated her leg, iced it, took ibuprofen with some relief. She states this morning her left leg feels much better. She states however when she woke up this morning she developed severe pain in her right hip. She denies any pain in her hip yesterday. She states pain to her hip is worse with movement and palpation of the hip joint. States she has a lot of pain while weightbearing on the right leg. She took ibuprofen this morning as well with no relief of her pain. She denies any numbness or weakness in extremities. No back pain. No other complaints.  Past Medical History  Diagnosis Date  . Bipolar 1 disorder   . Migraines   . Wears dentures     upper denture  . Adenotonsillar hypertrophy 03/2012    snores during sleep; denies apnea; states occ. wakes up coughing  . COPD (chronic obstructive pulmonary disease)   . Anemia   . Depression   . Anxiety   . Obesity   . Hypercholesterolemia   . Pneumonia   . Pancreatitis    Past Surgical History  Procedure Laterality Date  . Cesarean section  08/31/2001    total of  3  . Appendectomy    . Cholecystectomy    . Tubal  ligation  08/31/2001  . Dilation and evacuation  04/09/2000  . Tonsillectomy and adenoidectomy  04/12/2012    Procedure: TONSILLECTOMY AND ADENOIDECTOMY;  Surgeon: Darletta MollSui W Teoh, MD;  Location: [facility];  Service: ENT;  Laterality: Bilateral;  . Tonsillectomy     No family history on file. History  Substance Use Topics  . Smoking status: Current Every Day Smoker -- 0.33 packs/day    Types: Cigarettes  . Smokeless tobacco: Never Used     Comment: quit smoking 11/2011  . Alcohol Use: No   OB History   Grav Para Term Preterm Abortions TAB SAB Ect Mult Living                 Review of Systems  Genitourinary: Negative for flank pain and difficulty urinating.  Musculoskeletal: Positive for arthralgias, gait problem and joint swelling.  Skin: Negative for color change and wound.  Neurological: Negative for dizziness and headaches.  All other systems reviewed and are negative.     Allergies  Penicillins  Home Medications   Prior to Admission medications   Medication Sig Start Date End Date Taking? Authorizing Provider  albuterol (PROVENTIL HFA;VENTOLIN HFA) 108 (90 BASE) MCG/ACT inhaler Inhale 2 puffs into the lungs every 8 (eight) hours as needed for wheezing or shortness  of breath.    Historical Provider, MD  butalbital-acetaminophen-caffeine (FIORICET, ESGIC) 50-325-40 MG per tablet Take 1 tablet by mouth 4 (four) times daily as needed for headache.     Historical Provider, MD  cholecalciferol (VITAMIN D) 1000 UNITS tablet Take 5 tablets (5,000 Units total) by mouth daily. 04/25/13   Nanine Means, NP  diazepam (VALIUM) 5 MG tablet Take 5 mg by mouth 2 (two) times daily as needed for anxiety.    Historical Provider, MD  doxycycline (VIBRAMYCIN) 100 MG capsule Take 1 capsule (100 mg total) by mouth 2 (two) times daily. 11/28/13   Gerhard Munch, MD  FLUoxetine (PROZAC) 20 MG capsule Take 1 capsule (20 mg total) by mouth daily. 04/25/13   Nanine Means, NP  gabapentin  (NEURONTIN) 300 MG capsule Take 300 mg by mouth 3 (three) times daily.    Historical Provider, MD  HYDROcodone-acetaminophen (NORCO/VICODIN) 5-325 MG per tablet Take 1 tablet by mouth every 6 (six) hours as needed. 12/02/13   Arman Filter, NP  hydrOXYzine (ATARAX/VISTARIL) 25 MG tablet Take 1 tablet (25 mg total) by mouth every 6 (six) hours as needed for anxiety. 04/25/13   Nanine Means, NP  iron polysaccharides (NIFEREX) 150 MG capsule Take 1 capsule (150 mg total) by mouth 2 (two) times daily. 04/25/13   Nanine Means, NP  lamoTRIgine (LAMICTAL) 200 MG tablet Take 1 tablet (200 mg total) by mouth every morning. 04/25/13   Nanine Means, NP  medroxyPROGESTERone (PROVERA) 10 MG tablet Take 10 mg by mouth 2 (two) times daily.    Historical Provider, MD  Multiple Vitamin (MULTIVITAMIN WITH MINERALS) TABS tablet Take 1 tablet by mouth daily.    Historical Provider, MD  OLANZapine (ZYPREXA) 5 MG tablet Take 1 tablet (5 mg total) by mouth at bedtime. 04/25/13   Nanine Means, NP  oxyCODONE-acetaminophen (PERCOCET/ROXICET) 5-325 MG per tablet Take 1 tablet by mouth every 8 (eight) hours as needed for severe pain. 12/14/13   Jimmye Norman, NP  Probiotic Product (ACIDOPHILUS PROBIOTIC BLEND) CAPS Take 2 capsules by mouth 2 (two) times daily. 04/25/13   Nanine Means, NP  promethazine (PHENERGAN) 25 MG tablet Take 1 tablet (25 mg total) by mouth every 6 (six) hours as needed for nausea or vomiting. 11/21/13   Junius Finner, PA-C  rizatriptan (MAXALT-MLT) 10 MG disintegrating tablet Take 10 mg by mouth as needed for migraine. May repeat in 2 hours if needed    Historical Provider, MD  tiotropium (SPIRIVA) 18 MCG inhalation capsule Place 1 capsule (18 mcg total) into inhaler and inhale daily. 04/25/13   Nanine Means, NP  traZODone (DESYREL) 50 MG tablet Take 1 tablet (50 mg total) by mouth at bedtime as needed and may repeat dose one time if needed for sleep. 04/25/13   Nanine Means, NP   BP 105/90  Pulse 88  Temp(Src)  99 F (37.2 C) (Oral)  Resp 16  SpO2 97%  LMP 01/08/2014 Physical Exam  Nursing note and vitals reviewed. Constitutional: She is oriented to person, place, and time. She appears well-nourished.  HENT:  Head: Normocephalic and atraumatic.  Eyes: Pupils are equal, round, and reactive to light.  Neck: Normal range of motion.  Musculoskeletal:  Normal appearing left knee and left ankle, no swelling, no bruising, erythema. No abrasions. Left knee with full range of motion, negative anterior posterior drawer signs, no laxity with medial lateral stress. Tender to palpation over anterior knee. Patella tendon is intact. Left ankle is normal appearing, full range of  motion of the ankle joint. Tender over lateral malleolus. Dorsal pedal pulses intact. Tenderness over right hip joint over the greater trochanter. No swelling, bruising, erythema. Pain with any range of motion of the right hip joint. Full examination of the hip joint however painful.  Neurological: She is alert and oriented to person, place, and time.  Psychiatric: Her behavior is normal.    ED Course  Procedures (including critical care time)  DIAGNOSTIC STUDIES: Oxygen Saturation is 97% on RA, normal by my interpretation.    COORDINATION OF CARE:   1:48 PM-Discussed treatment plan which includes xrays and pain medication with pt at bedside and pt agreed to plan.   Labs Review Labs Reviewed - No data to display  Imaging Review No results found.   EKG Interpretation None      MDM   Final diagnoses:  Strain of right hip  Left knee sprain  Left ankle sprain   Patient with left knee, left ankle pain which is improving, now with worsening right hip pain, which just started this morning, all after a fall that happened yesterday afternoon. X-rays obtained and are negative. Suspect this could be a soft tissue injury, given mechanism of the fall. It is very hard to examine this patient given her pain. Have treated her in  emergency department with Percocet for her pain, we'll discharge home with crutches, pain medications, anti-inflammatories, followup with primary care Dr, RICE therapy.  Filed Vitals:   01/10/14 1342  BP: 105/90  Pulse: 88  Temp: 99 F (37.2 C)  TempSrc: Oral  Resp: 16  SpO2: 97%     I personally performed the services described in this documentation, which was scribed in my presence. The recorded information has been reviewed and is accurate.       Lottie Musselatyana A Taariq Leitz, PA-C 01/10/14 2032

## 2014-01-11 NOTE — ED Provider Notes (Signed)
Medical screening examination/treatment/procedure(s) were performed by non-physician practitioner and as supervising physician I was immediately available for consultation/collaboration.   EKG Interpretation None        William Yuliya Nova, MD 01/11/14 0005 

## 2014-01-12 ENCOUNTER — Emergency Department (HOSPITAL_COMMUNITY)
Admission: EM | Admit: 2014-01-12 | Discharge: 2014-01-12 | Disposition: A | Discharge status: Home / Self Care | Payer: Medicaid Other | Attending: Emergency Medicine | Admitting: Emergency Medicine

## 2014-01-12 ENCOUNTER — Encounter (HOSPITAL_COMMUNITY): Payer: Self-pay | Admitting: Emergency Medicine

## 2014-01-12 DIAGNOSIS — Z8701 Personal history of pneumonia (recurrent): Secondary | ICD-10-CM | POA: Insufficient documentation

## 2014-01-12 DIAGNOSIS — Y92009 Unspecified place in unspecified non-institutional (private) residence as the place of occurrence of the external cause: Secondary | ICD-10-CM | POA: Insufficient documentation

## 2014-01-12 DIAGNOSIS — X500XXA Overexertion from strenuous movement or load, initial encounter: Secondary | ICD-10-CM | POA: Insufficient documentation

## 2014-01-12 DIAGNOSIS — Z880 Allergy status to penicillin: Secondary | ICD-10-CM | POA: Insufficient documentation

## 2014-01-12 DIAGNOSIS — Z8719 Personal history of other diseases of the digestive system: Secondary | ICD-10-CM | POA: Insufficient documentation

## 2014-01-12 DIAGNOSIS — E7800 Pure hypercholesterolemia, unspecified: Secondary | ICD-10-CM | POA: Insufficient documentation

## 2014-01-12 DIAGNOSIS — F3130 Bipolar disorder, current episode depressed, mild or moderate severity, unspecified: Secondary | ICD-10-CM | POA: Insufficient documentation

## 2014-01-12 DIAGNOSIS — D649 Anemia, unspecified: Secondary | ICD-10-CM | POA: Insufficient documentation

## 2014-01-12 DIAGNOSIS — M549 Dorsalgia, unspecified: Secondary | ICD-10-CM

## 2014-01-12 DIAGNOSIS — Z792 Long term (current) use of antibiotics: Secondary | ICD-10-CM | POA: Insufficient documentation

## 2014-01-12 DIAGNOSIS — Y9389 Activity, other specified: Secondary | ICD-10-CM | POA: Insufficient documentation

## 2014-01-12 DIAGNOSIS — F411 Generalized anxiety disorder: Secondary | ICD-10-CM | POA: Insufficient documentation

## 2014-01-12 DIAGNOSIS — W010XXA Fall on same level from slipping, tripping and stumbling without subsequent striking against object, initial encounter: Secondary | ICD-10-CM | POA: Insufficient documentation

## 2014-01-12 DIAGNOSIS — E669 Obesity, unspecified: Secondary | ICD-10-CM | POA: Insufficient documentation

## 2014-01-12 DIAGNOSIS — Z79899 Other long term (current) drug therapy: Secondary | ICD-10-CM | POA: Insufficient documentation

## 2014-01-12 DIAGNOSIS — J4489 Other specified chronic obstructive pulmonary disease: Secondary | ICD-10-CM | POA: Insufficient documentation

## 2014-01-12 DIAGNOSIS — F17200 Nicotine dependence, unspecified, uncomplicated: Secondary | ICD-10-CM | POA: Insufficient documentation

## 2014-01-12 DIAGNOSIS — IMO0002 Reserved for concepts with insufficient information to code with codable children: Secondary | ICD-10-CM | POA: Insufficient documentation

## 2014-01-12 DIAGNOSIS — J449 Chronic obstructive pulmonary disease, unspecified: Secondary | ICD-10-CM | POA: Insufficient documentation

## 2014-01-12 DIAGNOSIS — G43909 Migraine, unspecified, not intractable, without status migrainosus: Secondary | ICD-10-CM | POA: Insufficient documentation

## 2014-01-12 MED ORDER — IBUPROFEN 800 MG PO TABS: 800.0000 mg | ORAL_TABLET | Freq: Three times a day (TID) | ORAL | Status: DC

## 2014-01-12 MED ORDER — OXYCODONE-ACETAMINOPHEN 5-325 MG PO TABS
1.0000 | ORAL_TABLET | Freq: Once | ORAL | Status: AC
  Administered 2014-01-12: 1 via ORAL
  Filled 2014-01-12: qty 1

## 2014-01-12 MED ORDER — CYCLOBENZAPRINE HCL 10 MG PO TABS: 10.0000 mg | ORAL_TABLET | Freq: Two times a day (BID) | ORAL | Status: DC | PRN

## 2014-01-12 NOTE — Discharge Instructions (Signed)
Back Pain, Adult Low back pain is very common. About 1 in 5 people have back pain.The cause of low back pain is rarely dangerous. The pain often gets better over time.About half of people with a sudden onset of back pain feel better in just 2 weeks. About 8 in 10 people feel better by 6 weeks.  CAUSES Some common causes of back pain include:  Strain of the muscles or ligaments supporting the spine.  Wear and tear (degeneration) of the spinal discs.  Arthritis.  Direct injury to the back. DIAGNOSIS Most of the time, the direct cause of low back pain is not known.However, back pain can be treated effectively even when the exact cause of the pain is unknown.Answering your caregiver's questions about your overall health and symptoms is one of the most accurate ways to make sure the cause of your pain is not dangerous. If your caregiver needs more information, he or she may order lab work or imaging tests (X-rays or MRIs).However, even if imaging tests show changes in your back, this usually does not require surgery. HOME CARE INSTRUCTIONS For many people, back pain returns.Since low back pain is rarely dangerous, it is often a condition that people can learn to [facility] their own.   Remain active. It is stressful on the back to sit or stand in one place. Do not sit, drive, or stand in one place for more than 30 minutes at a time. Take short walks on level surfaces as soon as pain allows.Try to increase the length of time you walk each day.  Do not stay in bed.Resting more than 1 or 2 days can delay your recovery.  Do not avoid exercise or work.Your body is made to move.It is not dangerous to be active, even though your back may hurt.Your back will likely heal faster if you return to being active before your pain is gone.  Pay attention to your body when you bend and lift. Many people have less discomfortwhen lifting if they bend their knees, keep the load close to their bodies,and  avoid twisting. Often, the most comfortable positions are those that put less stress on your recovering back.  Find a comfortable position to sleep. Use a firm mattress and lie on your side with your knees slightly bent. If you lie on your back, put a pillow under your knees.  Only take over-the-counter or prescription medicines as directed by your caregiver. Over-the-counter medicines to reduce pain and inflammation are often the most helpful.Your caregiver may prescribe muscle relaxant drugs.These medicines help dull your pain so you can more quickly return to your normal activities and healthy exercise.  Put ice on the injured area.  Put ice in a plastic bag.  Place a towel between your skin and the bag.  Leave the ice on for 15-20 minutes, 03-04 times a day for the first 2 to 3 days. After that, ice and heat may be alternated to reduce pain and spasms.  Ask your caregiver about trying back exercises and gentle massage. This may be of some benefit.  Avoid feeling anxious or stressed.Stress increases muscle tension and can worsen back pain.It is important to recognize when you are anxious or stressed and learn ways to manage it.Exercise is a great option. SEEK MEDICAL CARE IF:  You have pain that is not relieved with rest or medicine.  You have pain that does not improve in 1 week.  You have new symptoms.  You are generally not feeling well. SEEK  IMMEDIATE MEDICAL CARE IF:   You have pain that radiates from your back into your legs.  You develop new bowel or bladder control problems.  You have unusual weakness or numbness in your arms or legs.  You develop nausea or vomiting.  You develop abdominal pain.  You feel faint. Document Released: 09/14/2005 Document Revised: 03/15/2012 Document Reviewed: 02/02/2011 [facility] Patient Information 2014 [place]ExitCare, [place]. Cryotherapy Cryotherapy means treatment with cold. Ice or gel packs can be used to reduce both pain and  swelling. Ice is the most helpful within the first 24 to 48 hours after an injury or flareup from overusing a muscle or joint. Sprains, strains, spasms, burning pain, shooting pain, and aches can all be eased with ice. Ice can also be used when recovering from surgery. Ice is effective, has very few side effects, and is safe for most people to use. PRECAUTIONS  Ice is not a safe treatment option for people with:  Raynaud's phenomenon. This is a condition affecting small blood vessels in the extremities. Exposure to cold may cause your problems to return.  Cold hypersensitivity. There are many forms of cold hypersensitivity, including:  Cold urticaria. Red, itchy hives appear on the skin when the tissues begin to warm after being iced.  Cold erythema. This is a red, itchy rash caused by exposure to cold.  Cold hemoglobinuria. Red blood cells break down when the tissues begin to warm after being iced. The hemoglobin that carry oxygen are passed into the urine because they cannot combine with blood proteins fast enough.  Numbness or altered sensitivity in the area being iced. If you have any of the following conditions, do not use ice until you have discussed cryotherapy with your caregiver:  Heart conditions, such as arrhythmia, angina, or chronic heart disease.  High blood pressure.  Healing wounds or open skin in the area being iced.  Current infections.  Rheumatoid arthritis.  Poor circulation.  Diabetes. Ice slows the blood flow in the region it is applied. This is beneficial when trying to stop inflamed tissues from spreading irritating chemicals to surrounding tissues. However, if you expose your skin to cold temperatures for too long or without the proper protection, you can damage your skin or nerves. Watch for signs of skin damage due to cold. HOME CARE INSTRUCTIONS Follow these tips to use ice and cold packs safely.  Place a dry or damp towel between the ice and skin. A damp  towel will cool the skin more quickly, so you may need to shorten the time that the ice is used.  For a more rapid response, add gentle compression to the ice.  Ice for no more than 10 to 20 minutes at a time. The bonier the area you are icing, the less time it will take to get the benefits of ice.  Check your skin after 5 minutes to make sure there are no signs of a poor response to cold or skin damage.  Rest 20 minutes or more in between uses.  Once your skin is numb, you can end your treatment. You can test numbness by very lightly touching your skin. The touch should be so light that you do not see the skin dimple from the pressure of your fingertip. When using ice, most people will feel these normal sensations in this order: cold, burning, aching, and numbness.  Do not use ice on someone who cannot communicate their responses to pain, such as small children or people with dementia. HOW TO  MAKE AN ICE PACK Ice packs are the most common way to use ice therapy. Other methods include ice massage, ice baths, and cryo-sprays. Muscle creams that cause a cold, tingly feeling do not offer the same benefits that ice offers and should not be used as a substitute unless recommended by your caregiver. To make an ice pack, do one of the following:  Place crushed ice or a bag of frozen vegetables in a sealable plastic bag. Squeeze out the excess air. Place this bag inside another plastic bag. Slide the bag into a pillowcase or place a damp towel between your skin and the bag.  Mix 3 parts water with 1 part rubbing alcohol. Freeze the mixture in a sealable plastic bag. When you remove the mixture from the freezer, it will be slushy. Squeeze out the excess air. Place this bag inside another plastic bag. Slide the bag into a pillowcase or place a damp towel between your skin and the bag. SEEK MEDICAL CARE IF:  You develop white spots on your skin. This may give the skin a blotchy (mottled)  appearance.  Your skin turns blue or pale.  Your skin becomes waxy or hard.  Your swelling gets worse. MAKE SURE YOU:   Understand these instructions.  Will watch your condition.  Will get help right away if you are not doing well or get worse. Document Released: 05/11/2011 Document Revised: 12/07/2011 Document Reviewed: 05/11/2011 [facility] Patient Information 2014 [place]ExitCare, [place]LLC. Muscle Strain A muscle strain is an injury that occurs when a muscle is stretched beyond its normal length. Usually a small number of muscle fibers are torn when this happens. Muscle strain is rated in degrees. First-degree strains have the least amount of muscle fiber tearing and pain. Second-degree and third-degree strains have increasingly more tearing and pain.  Usually, recovery from muscle strain takes 1 2 weeks. Complete healing takes 5 6 weeks.  CAUSES  Muscle strain happens when a sudden, violent force placed on a muscle stretches it too far. This may occur with lifting, sports, or a fall.  RISK FACTORS Muscle strain is especially common in athletes.  SIGNS AND SYMPTOMS At the site of the muscle strain, there may be:  Pain.  Bruising.  Swelling.  Difficulty using the muscle due to pain or lack of normal function. DIAGNOSIS  Your health care provider will perform a physical exam and ask about your medical history. TREATMENT  Often, the best treatment for a muscle strain is resting, icing, and applying cold compresses to the injured area.  HOME CARE INSTRUCTIONS   Use the PRICE method of treatment to promote muscle healing during the first 2 3 days after your injury. The PRICE method involves:  Protecting the muscle from being injured again.  Restricting your activity and resting the injured body part.  Icing your injury. To do this, put ice in a plastic bag. Place a towel between your skin and the bag. Then, apply the ice and leave it on from 15 20 minutes each hour. After the third  day, switch to moist heat packs.  Apply compression to the injured area with a splint or elastic bandage. Be careful not to wrap it too tightly. This may interfere with blood circulation or increase swelling.  Elevate the injured body part above the level of your heart as often as you can.  Only take over-the-counter or prescription medicines for pain, discomfort, or fever as directed by your health care provider.  Warming up prior to exercise  helps to prevent future muscle strains. SEEK MEDICAL CARE IF:   You have increasing pain or swelling in the injured area.  You have numbness, tingling, or a significant loss of strength in the injured area. MAKE SURE YOU:   Understand these instructions.  Will watch your condition.  Will get help right away if you are not doing well or get worse. Document Released: 09/14/2005 Document Revised: 07/05/2013 Document Reviewed: 04/13/2013 [facility] Patient Information 2014 [place]ExitCare, [place]LLC.

## 2014-01-12 NOTE — ED Provider Notes (Signed)
Medical screening examination/treatment/procedure(s) were performed by non-physician practitioner and as supervising physician I was immediately available for consultation/collaboration.  Flint MelterElliott L Perel Hauschild, MD 01/12/14 [phone]

## 2014-01-12 NOTE — ED Provider Notes (Signed)
CSN: [id number]     Arrival date & time 01/12/14  0904 History  This chart was scribed for non-physician practitioner working with Elpidio Anis, by Tana Conch ED Scribe. This patient was seen in TR09C/TR09C and the patient's care was started at 9:18 AM.     Chief Complaint  Patient presents with  . Back Pain      The history is provided by the patient. No language interpreter was used.   HPI Comments: Shelby Hatfield is a 38 y.o. female who presents to the Emergency Department complaining of persistent, worsening back pain that began when she slipped and fell on the porch at her mothers house a few days ago. At the time of her fall,  she twisted and landed on her right hip. Pt reports associated right hip pain, left knee pain, and left ankle pain. She states that her fall has forced her to use crutches .She denies abdominal pain. Pt was seen in ED 01/10/13 for similar symptoms.   Pt allergic to penicillin. No PCP currently   Past Medical History  Diagnosis Date  . Bipolar 1 disorder   . Migraines   . Wears dentures     upper denture  . Adenotonsillar hypertrophy 03/2012    snores during sleep; denies apnea; states occ. wakes up coughing  . COPD (chronic obstructive pulmonary disease)   . Anemia   . Depression   . Anxiety   . Obesity   . Hypercholesterolemia   . Pneumonia   . Pancreatitis    Past Surgical History  Procedure Laterality Date  . Cesarean section  08/31/2001    total of  3  . Appendectomy    . Cholecystectomy    . Tubal ligation  08/31/2001  . Dilation and evacuation  04/09/2000  . Tonsillectomy and adenoidectomy  04/12/2012    Procedure: TONSILLECTOMY AND ADENOIDECTOMY;  Surgeon: Darletta Moll, MD;  Location: [facility];  Service: ENT;  Laterality: Bilateral;  . Tonsillectomy     No family history on file. History  Substance Use Topics  . Smoking status: Current Every Day Smoker -- 0.33 packs/day    Types: Cigarettes  . Smokeless tobacco:  Never Used     Comment: quit smoking 11/2011  . Alcohol Use: No   OB History   Grav Para Term Preterm Abortions TAB SAB Ect Mult Living                 Review of Systems  Musculoskeletal: Positive for arthralgias, back pain, gait problem, joint swelling and myalgias.  All other systems reviewed and are negative.     Allergies  Penicillins  Home Medications   Prior to Admission medications   Medication Sig Start Date End Date Taking? Authorizing Provider  albuterol (PROVENTIL HFA;VENTOLIN HFA) 108 (90 BASE) MCG/ACT inhaler Inhale 2 puffs into the lungs every 8 (eight) hours as needed for wheezing or shortness of breath.    Historical Provider, MD  butalbital-acetaminophen-caffeine (FIORICET, ESGIC) 50-325-40 MG per tablet Take 1 tablet by mouth 4 (four) times daily as needed for headache.     Historical Provider, MD  cholecalciferol (VITAMIN D) 1000 UNITS tablet Take 5 tablets (5,000 Units total) by mouth daily. 04/25/13   Nanine Means, NP  diazepam (VALIUM) 5 MG tablet Take 5 mg by mouth 2 (two) times daily as needed for anxiety.    Historical Provider, MD  doxycycline (VIBRAMYCIN) 100 MG capsule Take 1 capsule (100 mg total) by mouth 2 (two)  times daily. 11/28/13   Gerhard Munchobert Lockwood, MD  FLUoxetine (PROZAC) 20 MG capsule Take 1 capsule (20 mg total) by mouth daily. 04/25/13   Nanine MeansJamison Lord, NP  gabapentin (NEURONTIN) 300 MG capsule Take 300 mg by mouth 3 (three) times daily.    Historical Provider, MD  HYDROcodone-acetaminophen (NORCO/VICODIN) 5-325 MG per tablet Take 1 tablet by mouth every 6 (six) hours as needed. 12/02/13   Arman FilterGail K Schulz, NP  HYDROcodone-acetaminophen (NORCO/VICODIN) 5-325 MG per tablet Take 1 tablet by mouth every 4 (four) hours as needed. 01/10/14   Tatyana A Kirichenko, PA-C  hydrOXYzine (ATARAX/VISTARIL) 25 MG tablet Take 1 tablet (25 mg total) by mouth every 6 (six) hours as needed for anxiety. 04/25/13   Nanine MeansJamison Lord, NP  ibuprofen (ADVIL,MOTRIN) 600 MG tablet Take 1  tablet (600 mg total) by mouth every 6 (six) hours as needed. 01/10/14   Tatyana A Kirichenko, PA-C  iron polysaccharides (NIFEREX) 150 MG capsule Take 1 capsule (150 mg total) by mouth 2 (two) times daily. 04/25/13   Nanine MeansJamison Lord, NP  lamoTRIgine (LAMICTAL) 200 MG tablet Take 1 tablet (200 mg total) by mouth every morning. 04/25/13   Nanine MeansJamison Lord, NP  medroxyPROGESTERone (PROVERA) 10 MG tablet Take 10 mg by mouth 2 (two) times daily.    Historical Provider, MD  Multiple Vitamin (MULTIVITAMIN WITH MINERALS) TABS tablet Take 1 tablet by mouth daily.    Historical Provider, MD  OLANZapine (ZYPREXA) 5 MG tablet Take 1 tablet (5 mg total) by mouth at bedtime. 04/25/13   Nanine MeansJamison Lord, NP  oxyCODONE-acetaminophen (PERCOCET/ROXICET) 5-325 MG per tablet Take 1 tablet by mouth every 8 (eight) hours as needed for severe pain. 12/14/13   Jimmye Normanavid John Smith, NP  Probiotic Product (ACIDOPHILUS PROBIOTIC BLEND) CAPS Take 2 capsules by mouth 2 (two) times daily. 04/25/13   Nanine MeansJamison Lord, NP  promethazine (PHENERGAN) 25 MG tablet Take 1 tablet (25 mg total) by mouth every 6 (six) hours as needed for nausea or vomiting. 11/21/13   Junius FinnerErin O'Malley, PA-C  rizatriptan (MAXALT-MLT) 10 MG disintegrating tablet Take 10 mg by mouth as needed for migraine. May repeat in 2 hours if needed    Historical Provider, MD  tiotropium (SPIRIVA) 18 MCG inhalation capsule Place 1 capsule (18 mcg total) into inhaler and inhale daily. 04/25/13   Nanine MeansJamison Lord, NP  traZODone (DESYREL) 50 MG tablet Take 1 tablet (50 mg total) by mouth at bedtime as needed and may repeat dose one time if needed for sleep. 04/25/13   Nanine MeansJamison Lord, NP   LMP 01/08/2014 Physical Exam  Nursing note and vitals reviewed. Constitutional: She is oriented to person, place, and time. She appears well-developed and well-nourished.  HENT:  Head: Atraumatic.  Neck: Normal range of motion.  Pulmonary/Chest: Effort normal.  Musculoskeletal: Normal range of motion.  Right paralumbar  tenderness w/o swelling   Neurological: She is alert and oriented to person, place, and time.    ED Course  Procedures (including critical care time)  DIAGNOSTIC STUDIES: Oxygen Saturation is 97% on Ra, normal by my interpretation.    COORDINATION OF CARE:   9:30 AM-Discussed treatment plan which includes managing her pain with pt at bedside and pt agreed to plan.   Labs Review Labs Reviewed - No data to display  Imaging Review Dg Hip Complete Right  01/10/2014   CLINICAL DATA:  Pain post trauma  EXAM: RIGHT HIP - COMPLETE 2+ VIEW  COMPARISON:  None.  FINDINGS: Frontal pelvis as well as frontal and lateral  right hip images were obtained. There is no fracture or dislocation. Joint spaces appear intact. No erosive change. There is a small bone island in the proximal right femoral neck.  IMPRESSION: No fracture or dislocation.  No appreciable arthropathy.   Electronically Signed   By: Bretta BangWilliam  Woodruff M.D.   On: 01/10/2014 15:04   Dg Ankle Complete Left  01/10/2014   CLINICAL DATA:  Pain post trauma  EXAM: LEFT ANKLE COMPLETE - 3+ VIEW  COMPARISON:  None.  FINDINGS: Frontal, oblique, and lateral views were obtained. There is no fracture or effusion. Ankle mortise appears intact. There are minimal spurs arising from the inferior and posterior calcaneus.  IMPRESSION: No fracture or effusion.  Mortise intact.   Electronically Signed   By: Bretta BangWilliam  Woodruff M.D.   On: 01/10/2014 15:06   Dg Knee Complete 4 Views Left  01/10/2014   CLINICAL DATA:  Pain post trauma  EXAM: LEFT KNEE - COMPLETE 4+ VIEW  COMPARISON:  None.  FINDINGS: Frontal, lateral, and bilateral oblique views were obtained. There is no fracture or dislocation. No effusion. Joint spaces appear intact.  IMPRESSION: No abnormality noted.   Electronically Signed   By: Bretta BangWilliam  Woodruff M.D.   On: 01/10/2014 15:05     EKG Interpretation None      MDM   Final diagnoses:  None   I personally performed the services described  in this documentation, which was scribed in my presence. The recorded information has been reviewed and is accurate.   1. Back pain  Patient chart reviewed. Multiple narcotic prescriptions in close period of time. Percocet x 1 given in ED and discussed with patient that further narcotic prescriptions through the ED was not appropriate care.     Arnoldo HookerShari A Mohan Erven, PA-C 01/12/14 1029

## 2014-01-12 NOTE — ED Notes (Signed)
Pt. Slipped down few steps and is having lower back pain.   [facility]  Pt. Needs more pain medication

## 2014-01-12 NOTE — ED Notes (Signed)
Pt. Not happy that she did not receive narcotic pain medication.   Pt. Stated, "This was a wasted trip"

## 2014-02-09 ENCOUNTER — Encounter (HOSPITAL_COMMUNITY): Payer: Self-pay | Admitting: Emergency Medicine

## 2014-02-09 ENCOUNTER — Emergency Department (HOSPITAL_COMMUNITY)
Admission: EM | Admit: 2014-02-09 | Discharge: 2014-02-10 | Disposition: A | Discharge status: Home / Self Care | Payer: Medicaid Other | Attending: Emergency Medicine | Admitting: Emergency Medicine

## 2014-02-09 DIAGNOSIS — F319 Bipolar disorder, unspecified: Secondary | ICD-10-CM | POA: Insufficient documentation

## 2014-02-09 DIAGNOSIS — R600 Localized edema: Secondary | ICD-10-CM

## 2014-02-09 DIAGNOSIS — F411 Generalized anxiety disorder: Secondary | ICD-10-CM | POA: Insufficient documentation

## 2014-02-09 DIAGNOSIS — Z79899 Other long term (current) drug therapy: Secondary | ICD-10-CM | POA: Insufficient documentation

## 2014-02-09 DIAGNOSIS — J441 Chronic obstructive pulmonary disease with (acute) exacerbation: Secondary | ICD-10-CM | POA: Insufficient documentation

## 2014-02-09 DIAGNOSIS — R3919 Other difficulties with micturition: Secondary | ICD-10-CM | POA: Insufficient documentation

## 2014-02-09 DIAGNOSIS — F17200 Nicotine dependence, unspecified, uncomplicated: Secondary | ICD-10-CM | POA: Insufficient documentation

## 2014-02-09 DIAGNOSIS — G43909 Migraine, unspecified, not intractable, without status migrainosus: Secondary | ICD-10-CM | POA: Insufficient documentation

## 2014-02-09 DIAGNOSIS — R609 Edema, unspecified: Secondary | ICD-10-CM | POA: Insufficient documentation

## 2014-02-09 DIAGNOSIS — Z880 Allergy status to penicillin: Secondary | ICD-10-CM | POA: Insufficient documentation

## 2014-02-09 DIAGNOSIS — Z8701 Personal history of pneumonia (recurrent): Secondary | ICD-10-CM | POA: Insufficient documentation

## 2014-02-09 DIAGNOSIS — Z8719 Personal history of other diseases of the digestive system: Secondary | ICD-10-CM | POA: Insufficient documentation

## 2014-02-09 DIAGNOSIS — E669 Obesity, unspecified: Secondary | ICD-10-CM | POA: Insufficient documentation

## 2014-02-09 DIAGNOSIS — Z3202 Encounter for pregnancy test, result negative: Secondary | ICD-10-CM | POA: Insufficient documentation

## 2014-02-09 DIAGNOSIS — R079 Chest pain, unspecified: Secondary | ICD-10-CM | POA: Insufficient documentation

## 2014-02-09 DIAGNOSIS — M7989 Other specified soft tissue disorders: Secondary | ICD-10-CM | POA: Insufficient documentation

## 2014-02-09 DIAGNOSIS — Z862 Personal history of diseases of the blood and blood-forming organs and certain disorders involving the immune mechanism: Secondary | ICD-10-CM | POA: Insufficient documentation

## 2014-02-09 NOTE — ED Notes (Signed)
Pt arrived to the ED with a complaint of chest pain.  Pt states he also has edema in her feet, hands and face.  Pt states that the pain radiates to her back.  Pt has not urinating but twice today.  Pt has had a large intake of fluids by mouth.

## 2014-02-10 ENCOUNTER — Emergency Department (HOSPITAL_COMMUNITY): Payer: Medicaid Other

## 2014-02-10 LAB — URINALYSIS, ROUTINE W REFLEX MICROSCOPIC
Bilirubin Urine: NEGATIVE
Glucose, UA: NEGATIVE mg/dL
HGB URINE DIPSTICK: NEGATIVE
Ketones, ur: NEGATIVE mg/dL
Leukocytes, UA: NEGATIVE
Nitrite: NEGATIVE
Protein, ur: NEGATIVE mg/dL
SPECIFIC GRAVITY, URINE: 1.03 (ref 1.005–1.030)
Urobilinogen, UA: 1 mg/dL (ref 0.0–1.0)
pH: 7 (ref 5.0–8.0)

## 2014-02-10 LAB — CBC
HCT: 32.9 % — ABNORMAL LOW (ref 36.0–46.0)
Hemoglobin: 10.6 g/dL — ABNORMAL LOW (ref 12.0–15.0)
MCH: 25.3 pg — AB (ref 26.0–34.0)
MCHC: 32.2 g/dL (ref 30.0–36.0)
MCV: 78.5 fL (ref 78.0–100.0)
Platelets: 316 10*3/uL (ref 150–400)
RBC: 4.19 MIL/uL (ref 3.87–5.11)
RDW: 15.2 % (ref 11.5–15.5)
WBC: 9.1 10*3/uL (ref 4.0–10.5)

## 2014-02-10 LAB — PRO B NATRIURETIC PEPTIDE: Pro B Natriuretic peptide (BNP): 124.8 pg/mL (ref 0–125)

## 2014-02-10 LAB — BASIC METABOLIC PANEL
BUN: 7 mg/dL (ref 6–23)
CALCIUM: 8.9 mg/dL (ref 8.4–10.5)
CO2: 26 meq/L (ref 19–32)
Chloride: 103 mEq/L (ref 96–112)
Creatinine, Ser: 0.7 mg/dL (ref 0.50–1.10)
GFR calc Af Amer: >90 mL/min (ref >90)
GFR calc non Af Amer: >90 mL/min (ref >90)
GLUCOSE: 132 mg/dL — AB (ref 70–99)
Potassium: 3.8 mEq/L (ref 3.7–5.3)
SODIUM: 140 meq/L (ref 137–147)

## 2014-02-10 LAB — LIPASE, BLOOD: LIPASE: 20 U/L (ref 11–59)

## 2014-02-10 LAB — PREGNANCY, URINE: Preg Test, Ur: NEGATIVE

## 2014-02-10 LAB — I-STAT TROPONIN, ED: TROPONIN I, POC: 0 ng/mL (ref 0.00–0.08)

## 2014-02-10 MED ORDER — HYDROCODONE-ACETAMINOPHEN 5-325 MG PO TABS
1.0000 | ORAL_TABLET | Freq: Four times a day (QID) | ORAL | Status: DC | PRN
Start: 2014-02-10 — End: 2014-04-25

## 2014-02-10 MED ORDER — OXYCODONE-ACETAMINOPHEN 5-325 MG PO TABS
2.0000 | ORAL_TABLET | Freq: Once | ORAL | Status: AC
  Administered 2014-02-10: 2 via ORAL
  Filled 2014-02-10: qty 2

## 2014-02-10 NOTE — ED Provider Notes (Signed)
CSN: [id number]     Arrival date & time 02/09/14  2337 History   First MD Initiated Contact with Patient 02/10/14 0021     Chief Complaint  Patient presents with  . Chest Pain  . Shortness of Breath     (Consider location/radiation/quality/duration/timing/severity/associated sxs/prior Treatment) HPI Comments: Patient reports 3 days of lower extremity swelling, SOB , 1 day of CP decreased urine output and swelling of her face and hands despite drinking large amounts of fluid today   Patient is a 38 y.o. female presenting with chest pain and shortness of breath. The history is provided by the patient.  Chest Pain Pain location:  L chest Pain quality: dull   Pain radiates to:  Does not radiate Pain radiates to the back: yes   Pain severity:  Mild Onset quality:  Gradual Timing:  Constant Progression:  Unchanged Chronicity:  New Context: at rest   Context: not breathing, no drug use, not eating, not lifting and no movement   Relieved by:  None tried Worsened by:  Nothing tried Ineffective treatments:  None tried Associated symptoms: headache, lower extremity edema and shortness of breath   Associated symptoms: no abdominal pain, no cough, no dizziness, no fatigue, no fever, no nausea and no weakness   Risk factors: obesity   Shortness of Breath Severity:  Mild Onset quality:  Gradual Timing:  Constant Progression:  Worsening Chronicity:  New Relieved by:  None tried Worsened by:  Activity Ineffective treatments:  None tried Associated symptoms: chest pain and headaches   Associated symptoms: no abdominal pain, no cough, no fever and no neck pain   Risk factors: obesity     Past Medical History  Diagnosis Date  . Bipolar 1 disorder   . Migraines   . Wears dentures     upper denture  . Adenotonsillar hypertrophy 03/2012    snores during sleep; denies apnea; states occ. wakes up coughing  . COPD (chronic obstructive pulmonary disease)   . Anemia   . Depression   .  Anxiety   . Obesity   . Hypercholesterolemia   . Pneumonia   . Pancreatitis    Past Surgical History  Procedure Laterality Date  . Cesarean section  08/31/2001    total of  3  . Appendectomy    . Cholecystectomy    . Tubal ligation  08/31/2001  . Dilation and evacuation  04/09/2000  . Tonsillectomy and adenoidectomy  04/12/2012    Procedure: TONSILLECTOMY AND ADENOIDECTOMY;  Surgeon: Darletta MollSui W Teoh, MD;  Location: [facility];  Service: ENT;  Laterality: Bilateral;  . Tonsillectomy     No family history on file. History  Substance Use Topics  . Smoking status: Current Every Day Smoker -- 0.33 packs/day    Types: Cigarettes  . Smokeless tobacco: Never Used     Comment: quit smoking 11/2011  . Alcohol Use: No   OB History   Grav Para Term Preterm Abortions TAB SAB Ect Mult Living                 Review of Systems  Constitutional: Negative for fever and fatigue.  Respiratory: Positive for shortness of breath. Negative for cough.   Cardiovascular: Positive for chest pain and leg swelling.  Gastrointestinal: Negative for nausea and abdominal pain.  Genitourinary: Positive for decreased urine volume. Negative for dysuria, frequency and flank pain.  Musculoskeletal: Negative for neck pain.  Neurological: Positive for headaches. Negative for dizziness and weakness.  Allergies  Penicillins  Home Medications   Prior to Admission medications   Medication Sig Start Date End Date Taking? Authorizing Provider  albuterol (PROVENTIL HFA;VENTOLIN HFA) 108 (90 BASE) MCG/ACT inhaler Inhale 2 puffs into the lungs every 8 (eight) hours as needed for shortness of breath.    Yes Historical Provider, MD  butalbital-acetaminophen-caffeine (FIORICET, ESGIC) 50-325-40 MG per tablet Take 1 tablet by mouth 4 (four) times daily as needed for headache.    Yes Historical Provider, MD  cyclobenzaprine (FLEXERIL) 10 MG tablet Take 1 tablet (10 mg total) by mouth 2 (two) times daily as  needed for muscle spasms. 01/12/14  Yes Shari A Upstill, PA-C  FLUoxetine (PROZAC) 20 MG capsule Take 1 capsule (20 mg total) by mouth daily. 04/25/13  Yes Nanine Means, NP  gabapentin (NEURONTIN) 300 MG capsule Take 300 mg by mouth 3 (three) times daily.   Yes Historical Provider, MD  ibuprofen (ADVIL,MOTRIN) 600 MG tablet Take 1 tablet (600 mg total) by mouth every 6 (six) hours as needed. 01/10/14  Yes Tatyana A Kirichenko, PA-C  ibuprofen (ADVIL,MOTRIN) 800 MG tablet Take 800 mg by mouth every 8 (eight) hours as needed for mild pain.   Yes Historical Provider, MD  iron polysaccharides (NIFEREX) 150 MG capsule Take 1 capsule (150 mg total) by mouth 2 (two) times daily. 04/25/13  Yes Nanine Means, NP  lamoTRIgine (LAMICTAL) 200 MG tablet Take 1 tablet (200 mg total) by mouth every morning. 04/25/13  Yes Nanine Means, NP  medroxyPROGESTERone (PROVERA) 10 MG tablet Take 10 mg by mouth 2 (two) times daily.   Yes Historical Provider, MD  Multiple Vitamin (MULTIVITAMIN WITH MINERALS) TABS tablet Take 1 tablet by mouth daily.   Yes Historical Provider, MD  OLANZapine (ZYPREXA) 5 MG tablet Take 1 tablet (5 mg total) by mouth at bedtime. 04/25/13  Yes Nanine Means, NP  Probiotic Product (ACIDOPHILUS PROBIOTIC BLEND) CAPS Take 2 capsules by mouth 2 (two) times daily. 04/25/13  Yes Nanine Means, NP  rizatriptan (MAXALT-MLT) 10 MG disintegrating tablet Take 10 mg by mouth daily as needed for migraine. May repeat in 2 hours if needed   Yes Historical Provider, MD  tiotropium (SPIRIVA) 18 MCG inhalation capsule Place 1 capsule (18 mcg total) into inhaler and inhale daily. 04/25/13  Yes Nanine Means, NP   BP 117/55  Pulse 82  Temp(Src) 98.5 F (36.9 C) (Oral)  Resp 18  SpO2 98%  LMP 02/06/2014 Physical Exam  Nursing note and vitals reviewed. Constitutional: She is oriented to person, place, and time. She appears well-developed and well-nourished.  HENT:  Head: Normocephalic.  Eyes: Pupils are equal, round, and  reactive to light.  Neck: Normal range of motion.  Cardiovascular: Normal rate and regular rhythm.   Pulmonary/Chest: Effort normal and breath sounds normal. No respiratory distress. She has no rales.  Abdominal: Soft. She exhibits no distension. There is no tenderness.  Musculoskeletal: She exhibits edema. She exhibits no tenderness.  Minimal pretibial edema bilaterally   Lymphadenopathy:    She has no cervical adenopathy.  Neurological: She is alert and oriented to person, place, and time.  Skin: Skin is warm and dry. No erythema.    ED Course  Procedures (including critical care time) Labs Review Labs Reviewed  CBC - Abnormal; Notable for the following:    Hemoglobin 10.6 (*)    HCT 32.9 (*)    MCH 25.3 (*)    All other components within normal limits  BASIC METABOLIC PANEL - Abnormal; Notable  for the following:    Glucose, Bld 132 (*)    All other components within normal limits  URINALYSIS, ROUTINE W REFLEX MICROSCOPIC - Abnormal; Notable for the following:    APPearance CLOUDY (*)    All other components within normal limits  PRO B NATRIURETIC PEPTIDE  PREGNANCY, URINE  LIPASE, BLOOD  I-STAT TROPOININ, ED    Imaging Review Dg Chest 2 View  02/10/2014   CLINICAL DATA:  Cough, shortness of breath, left chest pain, smoker, history of COPD.  EXAM: CHEST  2 VIEW  COMPARISON:  Chest CT dated 11/28/2013 Chest radiographs dated 11/28/2013.  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 The heart remains normal in in size. Stable diffuse peribronchial thickening and prominence of the interstitial markings. Unremarkable bones.  IMPRESSION: 1. No acute abnormality. 2. Stable chronic bronchitic changes and chronic interstitial lung disease. 3. Stable large left diaphragmatic eventration. 4. Stable hiatal hernia.   Electronically Signed   By: Gordan PaymentSteve  Reid M.D.   On: 02/10/2014 01:46     EKG Interpretation   Date/Time:  Saturday Feb 10 2014  00:05:59 EDT Ventricular Rate:  90 PR Interval:  118 QRS Duration: 94 QT Interval:  365 QTC Calculation: 447 R Axis:   81 Text Interpretation:  Sinus rhythm Nonspecific ST abnormality Confirmed by  OPITZ  MD, BRIAN ([id number]) on 02/10/2014 2:05:00 AM      MDM  LAbs Urine EKG, xray all normal Patient give Rx for Vicodan and FU instructions   Final diagnoses:  Chest pain  Edema extremities         Arman FilterGail K Rahmel Nedved, NP 02/10/14 0302  Arman FilterGail K Boy [place], NP 02/10/14 0302

## 2014-02-10 NOTE — Discharge Instructions (Signed)
Chest Pain (Nonspecific) °It is often hard to give a specific diagnosis for the cause of chest pain. There is always a chance that your pain could be related to something serious, such as a heart attack or a blood clot in the lungs. You need to follow up with your caregiver for further evaluation. °CAUSES  °· Heartburn. °· Pneumonia or bronchitis. °· Anxiety or stress. °· Inflammation around your heart (pericarditis) or lung (pleuritis or pleurisy). °· A blood clot in the lung. °· A collapsed lung (pneumothorax). It can develop suddenly on its own (spontaneous pneumothorax) or from injury (trauma) to the chest. °· Shingles infection (herpes zoster virus). °The chest wall is composed of bones, muscles, and cartilage. Any of these can be the source of the pain. °· The bones can be bruised by injury. °· The muscles or cartilage can be strained by coughing or overwork. °· The cartilage can be affected by inflammation and become sore (costochondritis). °DIAGNOSIS  °Lab tests or other studies, such as X-rays, electrocardiography, stress testing, or cardiac imaging, may be needed to find the cause of your pain.  °TREATMENT  °· Treatment depends on what may be causing your chest pain. Treatment may include: °· Acid blockers for heartburn. °· Anti-inflammatory medicine. °· Pain medicine for inflammatory conditions. °· Antibiotics if an infection is present. °· You may be advised to change lifestyle habits. This includes stopping smoking and avoiding alcohol, caffeine, and chocolate. °· You may be advised to keep your head raised (elevated) when sleeping. This reduces the chance of acid going backward from your stomach into your esophagus. °· Most of the time, nonspecific chest pain will improve within 2 to 3 days with rest and mild pain medicine. °HOME CARE INSTRUCTIONS  °· If antibiotics were prescribed, take your antibiotics as directed. Finish them even if you start to feel better. °· For the next few days, avoid physical  activities that bring on chest pain. Continue physical activities as directed. °· Do not smoke. °· Avoid drinking alcohol. °· Only take over-the-counter or prescription medicine for pain, discomfort, or fever as directed by your caregiver. °· Follow your caregiver's suggestions for further testing if your chest pain does not go away. °· Keep any follow-up appointments you made. If you do not go to an appointment, you could develop lasting (chronic) problems with pain. If there is any problem keeping an appointment, you must call to reschedule. °SEEK MEDICAL CARE IF:  °· You think you are having problems from the medicine you are taking. Read your medicine instructions carefully. °· Your chest pain does not go away, even after treatment. °· You develop a rash with blisters on your chest. °SEEK IMMEDIATE MEDICAL CARE IF:  °· You have increased chest pain or pain that spreads to your arm, neck, jaw, back, or abdomen. °· You develop shortness of breath, an increasing cough, or you are coughing up blood. °· You have severe back or abdominal pain, feel nauseous, or vomit. °· You develop severe weakness, fainting, or chills. °· You have a fever. °THIS IS AN EMERGENCY. Do not wait to see if the pain will go away. Get medical help at once. Call your local emergency services (911 in [place] Do not drive yourself to the hospital. °MAKE SURE YOU:  °· Understand these instructions. °· Will watch your condition. °· Will get help right away if you are not doing well or get worse. °Document Released: 06/24/2005 Document Revised: 12/07/2011 Document Reviewed: 04/19/2008 °[employer]® Patient Information ©2014 [employer]   [employer]. Tonight your lab values urine, ray, EKG  all normal  Please make an appointment with your PCP for further evaluation

## 2014-02-10 NOTE — ED Notes (Signed)
EKG given to EDP Dierdre Highmanpitz for review

## 2014-02-11 NOTE — ED Provider Notes (Signed)
Medical screening examination/treatment/procedure(s) were performed by non-physician practitioner and as supervising physician I was immediately available for consultation/collaboration.   EKG Interpretation   Date/Time:  Saturday Feb 10 2014 00:05:59 EDT Ventricular Rate:  90 PR Interval:  118 QRS Duration: 94 QT Interval:  365 QTC Calculation: 447 R Axis:   81 Text Interpretation:  Sinus rhythm Nonspecific ST abnormality Confirmed by  Anavictoria Wilk  MD, Yanira Tolsma ([id number]) on 02/10/2014 2:05:00 AM       Sunnie NielsenBrian Akiva Brassfield, MD 02/11/14 [phone]

## 2014-04-25 ENCOUNTER — Encounter (HOSPITAL_COMMUNITY): Payer: Self-pay | Admitting: Emergency Medicine

## 2014-04-25 ENCOUNTER — Emergency Department (HOSPITAL_COMMUNITY)
Admission: EM | Admit: 2014-04-25 | Discharge: 2014-04-25 | Disposition: A | Discharge status: Home / Self Care | Payer: Medicaid Other | Attending: Emergency Medicine | Admitting: Emergency Medicine

## 2014-04-25 DIAGNOSIS — E669 Obesity, unspecified: Secondary | ICD-10-CM | POA: Insufficient documentation

## 2014-04-25 DIAGNOSIS — Z880 Allergy status to penicillin: Secondary | ICD-10-CM | POA: Insufficient documentation

## 2014-04-25 DIAGNOSIS — F17200 Nicotine dependence, unspecified, uncomplicated: Secondary | ICD-10-CM | POA: Insufficient documentation

## 2014-04-25 DIAGNOSIS — J4489 Other specified chronic obstructive pulmonary disease: Secondary | ICD-10-CM | POA: Insufficient documentation

## 2014-04-25 DIAGNOSIS — Z79899 Other long term (current) drug therapy: Secondary | ICD-10-CM | POA: Insufficient documentation

## 2014-04-25 DIAGNOSIS — F411 Generalized anxiety disorder: Secondary | ICD-10-CM | POA: Insufficient documentation

## 2014-04-25 DIAGNOSIS — F319 Bipolar disorder, unspecified: Secondary | ICD-10-CM | POA: Insufficient documentation

## 2014-04-25 DIAGNOSIS — R1011 Right upper quadrant pain: Secondary | ICD-10-CM | POA: Insufficient documentation

## 2014-04-25 DIAGNOSIS — Z9889 Other specified postprocedural states: Secondary | ICD-10-CM | POA: Insufficient documentation

## 2014-04-25 DIAGNOSIS — Z862 Personal history of diseases of the blood and blood-forming organs and certain disorders involving the immune mechanism: Secondary | ICD-10-CM | POA: Insufficient documentation

## 2014-04-25 DIAGNOSIS — J449 Chronic obstructive pulmonary disease, unspecified: Secondary | ICD-10-CM | POA: Insufficient documentation

## 2014-04-25 DIAGNOSIS — Z8701 Personal history of pneumonia (recurrent): Secondary | ICD-10-CM | POA: Insufficient documentation

## 2014-04-25 DIAGNOSIS — Z8679 Personal history of other diseases of the circulatory system: Secondary | ICD-10-CM | POA: Insufficient documentation

## 2014-04-25 DIAGNOSIS — Z98811 Dental restoration status: Secondary | ICD-10-CM | POA: Insufficient documentation

## 2014-04-25 LAB — COMPREHENSIVE METABOLIC PANEL
ALT: 14 U/L (ref 0–35)
ANION GAP: 13 (ref 5–15)
AST: 14 U/L (ref 0–37)
Albumin: 3.4 g/dL — ABNORMAL LOW (ref 3.5–5.2)
Alkaline Phosphatase: 88 U/L (ref 39–117)
BUN: 9 mg/dL (ref 6–23)
CHLORIDE: 106 meq/L (ref 96–112)
CO2: 23 mEq/L (ref 19–32)
CREATININE: 0.8 mg/dL (ref 0.50–1.10)
Calcium: 9 mg/dL (ref 8.4–10.5)
GFR calc Af Amer: >90 mL/min (ref >90)
GFR calc non Af Amer: >90 mL/min (ref >90)
Glucose, Bld: 134 mg/dL — ABNORMAL HIGH (ref 70–99)
Potassium: 4.1 mEq/L (ref 3.7–5.3)
Sodium: 142 mEq/L (ref 137–147)
Total Protein: 6.6 g/dL (ref 6.0–8.3)

## 2014-04-25 LAB — URINALYSIS, ROUTINE W REFLEX MICROSCOPIC
Bilirubin Urine: NEGATIVE
GLUCOSE, UA: NEGATIVE mg/dL
LEUKOCYTES UA: NEGATIVE
Nitrite: NEGATIVE
Specific Gravity, Urine: 1.025 (ref 1.005–1.030)
Urobilinogen, UA: 0.2 mg/dL (ref 0.0–1.0)
pH: 6 (ref 5.0–8.0)

## 2014-04-25 LAB — CBC WITH DIFFERENTIAL/PLATELET
Basophils Absolute: 0 10*3/uL (ref 0.0–0.1)
Basophils Relative: 0 % (ref 0–1)
Eosinophils Absolute: 0.1 10*3/uL (ref 0.0–0.7)
Eosinophils Relative: 1 % (ref 0–5)
HEMATOCRIT: 35 % — AB (ref 36.0–46.0)
HEMOGLOBIN: 11.4 g/dL — AB (ref 12.0–15.0)
LYMPHS PCT: 30 % (ref 12–46)
Lymphs Abs: 3.1 10*3/uL (ref 0.7–4.0)
MCH: 25.1 pg — ABNORMAL LOW (ref 26.0–34.0)
MCHC: 32.6 g/dL (ref 30.0–36.0)
MCV: 76.9 fL — ABNORMAL LOW (ref 78.0–100.0)
MONO ABS: 0.5 10*3/uL (ref 0.1–1.0)
MONOS PCT: 5 % (ref 3–12)
NEUTROS ABS: 6.7 10*3/uL (ref 1.7–7.7)
Neutrophils Relative %: 64 % (ref 43–77)
Platelets: 313 10*3/uL (ref 150–400)
RBC: 4.55 MIL/uL (ref 3.87–5.11)
RDW: 15.6 % — ABNORMAL HIGH (ref 11.5–15.5)
WBC: 10.4 10*3/uL (ref 4.0–10.5)

## 2014-04-25 LAB — URINE MICROSCOPIC-ADD ON

## 2014-04-25 LAB — LIPASE, BLOOD: Lipase: 25 U/L (ref 11–59)

## 2014-04-25 MED ORDER — ONDANSETRON HCL 4 MG/2ML IJ SOLN
4.0000 mg | Freq: Once | INTRAMUSCULAR | Status: DC
  Filled 2014-04-25: qty 2

## 2014-04-25 MED ORDER — MORPHINE SULFATE 4 MG/ML IJ SOLN
4.0000 mg | Freq: Once | INTRAMUSCULAR | Status: AC
  Administered 2014-04-25: 4 mg via INTRAVENOUS
  Filled 2014-04-25: qty 1

## 2014-04-25 MED ORDER — ONDANSETRON HCL 4 MG/2ML IJ SOLN
4.0000 mg | Freq: Once | INTRAMUSCULAR | Status: AC
  Administered 2014-04-25: 4 mg via INTRAVENOUS

## 2014-04-25 MED ORDER — TRAMADOL HCL 50 MG PO TABS: 50.0000 mg | ORAL_TABLET | Freq: Four times a day (QID) | ORAL | Status: DC | PRN

## 2014-04-25 MED ORDER — PROMETHAZINE HCL 25 MG PO TABS: 25.0000 mg | ORAL_TABLET | Freq: Four times a day (QID) | ORAL | Status: DC | PRN

## 2014-04-25 MED ORDER — SODIUM CHLORIDE 0.9 % IV BOLUS (SEPSIS)
1000.0000 mL | Freq: Once | INTRAVENOUS | Status: AC
  Administered 2014-04-25: 1000 mL via INTRAVENOUS

## 2014-04-25 NOTE — ED Provider Notes (Signed)
CSN: [id number]     Arrival date & time 04/25/14  1803 History   First MD Initiated Contact with Patient 04/25/14 1823     Chief Complaint  Patient presents with  . Abdominal Pain     (Consider location/radiation/quality/duration/timing/severity/associated sxs/prior Treatment) HPI.... sharp intermittent right upper quadrant pain approximately 30 minutes ago. Patient is status post appendectomy and cholecystectomy and bilateral tubal ligation. She complains of nausea and diarrhea but no vomiting. No alcohol. History of pancreatitis.  No radiation. Severity is mild to moderate.  Past Medical History  Diagnosis Date  . Bipolar 1 disorder   . Migraines   . Wears dentures     upper denture  . Adenotonsillar hypertrophy 03/2012    snores during sleep; denies apnea; states occ. wakes up coughing  . COPD (chronic obstructive pulmonary disease)   . Anemia   . Depression   . Anxiety   . Obesity   . Hypercholesterolemia   . Pneumonia   . Pancreatitis    Past Surgical History  Procedure Laterality Date  . Cesarean section  08/31/2001    total of  3  . Appendectomy    . Cholecystectomy    . Tubal ligation  08/31/2001  . Dilation and evacuation  04/09/2000  . Tonsillectomy and adenoidectomy  04/12/2012    Procedure: TONSILLECTOMY AND ADENOIDECTOMY;  Surgeon: Darletta Moll, MD;  [facility];  Service: ENT;  Laterality: Bilateral;  . Tonsillectomy     Family History  Problem Relation Age of Onset  . Cancer Other    History  Substance Use Topics  . Smoking status: Current Every Day Smoker -- 0.33 packs/day for 18 years    Types: Cigarettes  . Smokeless tobacco: Never Used  . Alcohol Use: No   OB History   Grav Para Term Preterm Abortions TAB SAB Ect Mult Living   8 3 3  5  5   3      Review of Systems  All other systems reviewed and are negative.     Allergies  Penicillins  Home Medications   Prior to Admission medications   Medication Sig Start Date  End Date Taking? Authorizing Provider  promethazine (PHENERGAN) 25 MG tablet Take 1 tablet (25 mg total) by mouth every 6 (six) hours as needed. 04/25/14   Donnetta Hutching, MD  traMADol (ULTRAM) 50 MG tablet Take 1 tablet (50 mg total) by mouth every 6 (six) hours as needed. 04/25/14   Donnetta Hutching, MD   BP 105/63  Pulse 62  Temp(Src) 97.9 F (36.6 C) (Oral)  Resp 16  Ht 5\' 2"  (1.575 m)  Wt 160 lb (72.576 kg)  BMI 29.26 kg/m2  SpO2 94%  LMP 04/22/2014 Physical Exam  Nursing note and vitals reviewed. Constitutional: She is oriented to person, place, and time. She appears well-developed and well-nourished.  HENT:  Head: Normocephalic and atraumatic.  Eyes: Conjunctivae and EOM are normal. Pupils are equal, round, and reactive to light.  Neck: Normal range of motion. Neck supple.  Cardiovascular: Normal rate, regular rhythm and normal heart sounds.   Pulmonary/Chest: Effort normal and breath sounds normal.  Abdominal: Soft. Bowel sounds are normal.  Minimal right upper quadrant tenderness  Musculoskeletal: Normal range of motion.  Neurological: She is alert and oriented to person, place, and time.  Skin: Skin is warm and dry.  Psychiatric: She has a normal mood and affect. Her behavior is normal.    ED Course  Procedures (including critical care time)  Labs Review Labs Reviewed  CBC WITH DIFFERENTIAL - Abnormal; Notable for the following:    Hemoglobin 11.4 (*)    HCT 35.0 (*)    MCV 76.9 (*)    MCH 25.1 (*)    RDW 15.6 (*)    All other components within normal limits  COMPREHENSIVE METABOLIC PANEL - Abnormal; Notable for the following:    Glucose, Bld 134 (*)    Albumin 3.4 (*)    Total Bilirubin <0.2 (*)    All other components within normal limits  URINALYSIS, ROUTINE W REFLEX MICROSCOPIC - Abnormal; Notable for the following:    Hgb urine dipstick TRACE (*)    Ketones, ur TRACE (*)    Protein, ur TRACE (*)    All other components within normal limits  LIPASE, BLOOD  URINE  MICROSCOPIC-ADD ON    Imaging Review No results found.   EKG Interpretation None      MDM   Final diagnoses:  Right upper quadrant pain    No acute abdomen. Screening tests are negative.  Liver functions, lipase, white count are normal. Discharge medications Ultram and Phenergan 25 mg    Donnetta HutchingBrian Adarrius Graeff, MD 04/25/14 2009

## 2014-04-25 NOTE — ED Notes (Signed)
Discharge instructions and prescriptions given and reviewed with patient.  Patient verbalized understanding not to drive after taking medication and to obtain a PCP for further management.  Patient discharged home in good condition via wheelchair.

## 2014-04-25 NOTE — Discharge Instructions (Signed)
Tests were good. Medication for pain and nausea. Try to find a primary care Dr.

## 2014-04-25 NOTE — ED Notes (Signed)
Patient c/o mid abd pain that started 30 minutes ago. Patient reports diarrhea that started earlier this morning. Per patient nauseated but no vomiting, fevers, or urinary symptoms.

## 2014-04-29 ENCOUNTER — Encounter (HOSPITAL_COMMUNITY): Payer: Self-pay | Admitting: Emergency Medicine

## 2014-04-29 ENCOUNTER — Emergency Department (HOSPITAL_COMMUNITY)
Admission: EM | Admit: 2014-04-29 | Discharge: 2014-04-29 | Disposition: A | Discharge status: Home / Self Care | Payer: Medicaid Other | Attending: Emergency Medicine | Admitting: Emergency Medicine

## 2014-04-29 DIAGNOSIS — Z8709 Personal history of other diseases of the respiratory system: Secondary | ICD-10-CM | POA: Insufficient documentation

## 2014-04-29 DIAGNOSIS — F3289 Other specified depressive episodes: Secondary | ICD-10-CM | POA: Insufficient documentation

## 2014-04-29 DIAGNOSIS — J449 Chronic obstructive pulmonary disease, unspecified: Secondary | ICD-10-CM | POA: Insufficient documentation

## 2014-04-29 DIAGNOSIS — F17200 Nicotine dependence, unspecified, uncomplicated: Secondary | ICD-10-CM | POA: Insufficient documentation

## 2014-04-29 DIAGNOSIS — Z8701 Personal history of pneumonia (recurrent): Secondary | ICD-10-CM | POA: Insufficient documentation

## 2014-04-29 DIAGNOSIS — R112 Nausea with vomiting, unspecified: Secondary | ICD-10-CM | POA: Insufficient documentation

## 2014-04-29 DIAGNOSIS — J4489 Other specified chronic obstructive pulmonary disease: Secondary | ICD-10-CM | POA: Insufficient documentation

## 2014-04-29 DIAGNOSIS — Z3202 Encounter for pregnancy test, result negative: Secondary | ICD-10-CM | POA: Insufficient documentation

## 2014-04-29 DIAGNOSIS — Z880 Allergy status to penicillin: Secondary | ICD-10-CM | POA: Insufficient documentation

## 2014-04-29 DIAGNOSIS — Z79899 Other long term (current) drug therapy: Secondary | ICD-10-CM | POA: Insufficient documentation

## 2014-04-29 DIAGNOSIS — Z862 Personal history of diseases of the blood and blood-forming organs and certain disorders involving the immune mechanism: Secondary | ICD-10-CM | POA: Insufficient documentation

## 2014-04-29 DIAGNOSIS — Z9889 Other specified postprocedural states: Secondary | ICD-10-CM | POA: Insufficient documentation

## 2014-04-29 DIAGNOSIS — Z9089 Acquired absence of other organs: Secondary | ICD-10-CM | POA: Insufficient documentation

## 2014-04-29 DIAGNOSIS — Z7982 Long term (current) use of aspirin: Secondary | ICD-10-CM | POA: Insufficient documentation

## 2014-04-29 DIAGNOSIS — R109 Unspecified abdominal pain: Secondary | ICD-10-CM

## 2014-04-29 DIAGNOSIS — E669 Obesity, unspecified: Secondary | ICD-10-CM | POA: Insufficient documentation

## 2014-04-29 DIAGNOSIS — Z8679 Personal history of other diseases of the circulatory system: Secondary | ICD-10-CM | POA: Insufficient documentation

## 2014-04-29 DIAGNOSIS — F411 Generalized anxiety disorder: Secondary | ICD-10-CM | POA: Insufficient documentation

## 2014-04-29 DIAGNOSIS — F329 Major depressive disorder, single episode, unspecified: Secondary | ICD-10-CM | POA: Insufficient documentation

## 2014-04-29 DIAGNOSIS — R197 Diarrhea, unspecified: Secondary | ICD-10-CM | POA: Insufficient documentation

## 2014-04-29 LAB — CBC WITH DIFFERENTIAL/PLATELET
Basophils Absolute: 0 10*3/uL (ref 0.0–0.1)
Basophils Relative: 0 % (ref 0–1)
Eosinophils Absolute: 0.2 10*3/uL (ref 0.0–0.7)
Eosinophils Relative: 2 % (ref 0–5)
HCT: 35.1 % — ABNORMAL LOW (ref 36.0–46.0)
Hemoglobin: 11 g/dL — ABNORMAL LOW (ref 12.0–15.0)
LYMPHS PCT: 39 % (ref 12–46)
Lymphs Abs: 3.5 10*3/uL (ref 0.7–4.0)
MCH: 24.3 pg — ABNORMAL LOW (ref 26.0–34.0)
MCHC: 31.3 g/dL (ref 30.0–36.0)
MCV: 77.5 fL — AB (ref 78.0–100.0)
Monocytes Absolute: 0.4 10*3/uL (ref 0.1–1.0)
Monocytes Relative: 4 % (ref 3–12)
NEUTROS PCT: 55 % (ref 43–77)
Neutro Abs: 4.9 10*3/uL (ref 1.7–7.7)
PLATELETS: 317 10*3/uL (ref 150–400)
RBC: 4.53 MIL/uL (ref 3.87–5.11)
RDW: 15.6 % — ABNORMAL HIGH (ref 11.5–15.5)
WBC: 8.9 10*3/uL (ref 4.0–10.5)

## 2014-04-29 LAB — URINALYSIS, ROUTINE W REFLEX MICROSCOPIC
Bilirubin Urine: NEGATIVE
Glucose, UA: NEGATIVE mg/dL
Ketones, ur: NEGATIVE mg/dL
Leukocytes, UA: NEGATIVE
NITRITE: NEGATIVE
Protein, ur: NEGATIVE mg/dL
SPECIFIC GRAVITY, URINE: 1.016 (ref 1.005–1.030)
UROBILINOGEN UA: 0.2 mg/dL (ref 0.0–1.0)
pH: 6.5 (ref 5.0–8.0)

## 2014-04-29 LAB — COMPREHENSIVE METABOLIC PANEL
ALBUMIN: 3.2 g/dL — AB (ref 3.5–5.2)
ALT: 12 U/L (ref 0–35)
AST: 14 U/L (ref 0–37)
Alkaline Phosphatase: 85 U/L (ref 39–117)
Anion gap: 10 (ref 5–15)
BUN: 7 mg/dL (ref 6–23)
CO2: 25 mEq/L (ref 19–32)
CREATININE: 0.93 mg/dL (ref 0.50–1.10)
Calcium: 8.5 mg/dL (ref 8.4–10.5)
Chloride: 107 mEq/L (ref 96–112)
GFR calc Af Amer: 89 mL/min — ABNORMAL LOW (ref >90)
GFR calc non Af Amer: 76 mL/min — ABNORMAL LOW (ref >90)
Glucose, Bld: 94 mg/dL (ref 70–99)
POTASSIUM: 4.1 meq/L (ref 3.7–5.3)
Sodium: 142 mEq/L (ref 137–147)
TOTAL PROTEIN: 6.2 g/dL (ref 6.0–8.3)
Total Bilirubin: <0.2 mg/dL — ABNORMAL LOW (ref 0.3–1.2)

## 2014-04-29 LAB — URINE MICROSCOPIC-ADD ON

## 2014-04-29 LAB — LIPASE, BLOOD: LIPASE: 24 U/L (ref 11–59)

## 2014-04-29 LAB — PREGNANCY, URINE: Preg Test, Ur: NEGATIVE

## 2014-04-29 MED ORDER — HYDROMORPHONE HCL PF 1 MG/ML IJ SOLN: 1.0000 mg | INTRAMUSCULAR | Status: DC | PRN

## 2014-04-29 MED ORDER — SODIUM CHLORIDE 0.9 % IV BOLUS (SEPSIS): 1000.0000 mL | Freq: Once | INTRAVENOUS | Status: DC

## 2014-04-29 MED ORDER — SODIUM CHLORIDE 0.9 % IV SOLN: Freq: Once | INTRAVENOUS | Status: DC

## 2014-04-29 MED ORDER — SODIUM CHLORIDE 0.9 % IV SOLN
Freq: Once | INTRAVENOUS | Status: AC
  Administered 2014-04-29: 18:00:00 via INTRAVENOUS

## 2014-04-29 MED ORDER — ONDANSETRON HCL 4 MG/2ML IJ SOLN: 4.0000 mg | Freq: Once | INTRAMUSCULAR | Status: DC

## 2014-04-29 MED ORDER — DICYCLOMINE HCL 20 MG PO TABS: 20.0000 mg | ORAL_TABLET | Freq: Two times a day (BID) | ORAL | Status: DC

## 2014-04-29 MED ORDER — MORPHINE SULFATE 4 MG/ML IJ SOLN
4.0000 mg | INTRAMUSCULAR | Status: DC | PRN
  Administered 2014-04-29 (×2): 4 mg via INTRAVENOUS
  Filled 2014-04-29 (×2): qty 1

## 2014-04-29 MED ORDER — SODIUM CHLORIDE 0.9 % IV BOLUS (SEPSIS)
1000.0000 mL | Freq: Once | INTRAVENOUS | Status: AC
  Administered 2014-04-29: 1000 mL via INTRAVENOUS

## 2014-04-29 MED ORDER — HYDROMORPHONE HCL PF 1 MG/ML IJ SOLN
1.0000 mg | Freq: Once | INTRAMUSCULAR | Status: AC
  Administered 2014-04-29: 1 mg via INTRAVENOUS
  Filled 2014-04-29: qty 1

## 2014-04-29 MED ORDER — ONDANSETRON HCL 4 MG/2ML IJ SOLN
4.0000 mg | Freq: Once | INTRAMUSCULAR | Status: AC
  Administered 2014-04-29: 4 mg via INTRAVENOUS
  Filled 2014-04-29: qty 2

## 2014-04-29 MED ORDER — ONDANSETRON 4 MG PO TBDP: 4.0000 mg | ORAL_TABLET | Freq: Three times a day (TID) | ORAL | Status: DC | PRN

## 2014-04-29 MED ORDER — DEXAMETHASONE SODIUM PHOSPHATE 10 MG/ML IJ SOLN: 10.0000 mg | Freq: Once | INTRAMUSCULAR | Status: DC

## 2014-04-29 NOTE — Discharge Instructions (Signed)

## 2014-04-29 NOTE — ED Notes (Signed)
Pt c/o generalized abdominal pain/cramping with N/v x 2 days. Pt also reports bit by what she thinks was a brown spider on her left upper arm.

## 2014-04-29 NOTE — ED Provider Notes (Addendum)
CSN: [id number]     Arrival [id number] & time 04/29/14  1420 History   First MD Initiated Contact with Patient 04/29/14 1626     Chief Complaint  Patient presents with  . Abdominal Pain  . Insect Bite      HPI  Patient with a history of chronic and recurrent abdominal pain presents with until the pain and nausea and vomiting for the last 2 days. Also this concerned because she saw some red dots on her shoulder) spider bite. She's not recall being bitten by a spider. Negative nonbilious emesis. Diarrhea but no blood. No fevers or chills. No respiratory complaints. Abdominal pain preceded her "bite" by 48 hours. Not concerned this is a black widow evenomation.  Past Medical History  Diagnosis Date  . Bipolar 1 disorder   . Migraines   . Wears dentures     upper denture  . Adenotonsillar hypertrophy 03/2012    snores during sleep; denies apnea; states occ. wakes up coughing  . COPD (chronic obstructive pulmonary disease)   . Anemia   . Depression   . Anxiety   . Obesity   . Hypercholesterolemia   . Pneumonia   . Pancreatitis    Past Surgical History  Procedure Laterality Date  . Cesarean section  08/31/2001    total of  3  . Appendectomy    . Cholecystectomy    . Tubal ligation  08/31/2001  . Dilation and evacuation  04/09/2000  . Tonsillectomy and adenoidectomy  04/12/2012    Procedure: TONSILLECTOMY AND ADENOIDECTOMY;  Surgeon: Darletta MollSui W Teoh, MD;  Location: [facility];  Service: ENT;  Laterality: Bilateral;  . Tonsillectomy     Family History  Problem Relation Age of Onset  . Cancer Other    History  Substance Use Topics  . Smoking status: Current Every Day Smoker -- 0.33 packs/day for 18 years    Types: Cigarettes  . Smokeless tobacco: Never Used  . Alcohol Use: No   OB History   Grav Para Term Preterm Abortions TAB SAB Ect Mult Living   8 3 3  5  5   3      Review of Systems  Constitutional: Negative for fever, chills, diaphoresis, appetite change and  fatigue.  HENT: Negative for mouth sores, sore throat and trouble swallowing.   Eyes: Negative for visual disturbance.  Respiratory: Negative for cough, chest tightness, shortness of breath and wheezing.   Cardiovascular: Negative for chest pain.  Gastrointestinal: Positive for nausea, vomiting, abdominal pain and diarrhea. Negative for abdominal distention.  Endocrine: Negative for polydipsia, polyphagia and polyuria.  Genitourinary: Negative for dysuria, frequency and hematuria.  Musculoskeletal: Negative for gait problem.  Skin: Negative for color change, pallor and rash.  Neurological: Negative for dizziness, syncope, light-headedness and headaches.  Hematological: Does not bruise/bleed easily.  Psychiatric/Behavioral: Negative for behavioral problems and confusion.      Allergies  Penicillins  Home Medications   Prior to Admission medications   Medication Sig Start Date End Date Taking? Authorizing Provider  albuterol (PROVENTIL HFA;VENTOLIN HFA) 108 (90 BASE) MCG/ACT inhaler Inhale 2 puffs into the lungs 3 (three) times daily as needed for wheezing or shortness of breath.   Yes Historical Provider, MD  Aspirin-Salicylamide-Caffeine (BC HEADACHE POWDER PO) Take 1 packet by mouth 4 (four) times daily as needed.   Yes Historical Provider, MD  promethazine (PHENERGAN) 25 MG tablet Take 25 mg by mouth every 6 (six) hours as needed for nausea or vomiting.  Yes Historical Provider, MD  tiotropium (SPIRIVA) 18 MCG inhalation capsule Place 18 mcg into inhaler and inhale daily.   Yes Historical Provider, MD  traMADol (ULTRAM) 50 MG tablet Take by mouth every 6 (six) hours as needed (pain).   Yes Historical Provider, MD  dicyclomine (BENTYL) 20 MG tablet Take 1 tablet (20 mg total) by mouth 2 (two) times daily. 04/29/14   Rolland Porter, MD  ondansetron (ZOFRAN ODT) 4 MG disintegrating tablet Take 1 tablet (4 mg total) by mouth every 8 (eight) hours as needed for nausea. 04/29/14   Rolland Porter, MD     BP 128/78  Pulse 71  Temp(Src) 98.2 F (36.8 C) (Oral)  Resp 19  Ht 5\' 2"  (1.575 m)  Wt 220 lb (99.791 kg)  BMI 40.23 kg/m2  SpO2 94%  LMP 04/22/2014 Physical Exam  Constitutional: She is oriented to person, place, and time. She appears well-developed and well-nourished. No distress.  HENT:  Head: Normocephalic.  Eyes: Conjunctivae are normal. Pupils are equal, round, and reactive to light. No scleral icterus.  Neck: Normal range of motion. Neck supple. No thyromegaly present.  Cardiovascular: Normal rate and regular rhythm.  Exam reveals no gallop and no friction rub.   No murmur heard. Pulmonary/Chest: Effort normal and breath sounds normal. No respiratory distress. She has no wheezes. She has no rales.  Abdominal: Soft. Bowel sounds are normal. She exhibits no distension. There is no tenderness. There is no rebound.  Obese. Soft benign. No guarding rebound or peritoneal irritation.  Musculoskeletal: Normal range of motion.  Neurological: She is alert and oriented to person, place, and time.  Skin: Skin is warm and dry. No rash noted.     Psychiatric: She has a normal mood and affect. Her behavior is normal.    ED Course  Procedures (including critical care time) Labs Review Labs Reviewed  CBC WITH DIFFERENTIAL - Abnormal; Notable for the following:    Hemoglobin 11.0 (*)    HCT 35.1 (*)    MCV 77.5 (*)    MCH 24.3 (*)    RDW 15.6 (*)    All other components within normal limits  COMPREHENSIVE METABOLIC PANEL - Abnormal; Notable for the following:    Albumin 3.2 (*)    Total Bilirubin <0.2 (*)    GFR calc non Af Amer 76 (*)    GFR calc Af Amer 89 (*)    All other components within normal limits  URINALYSIS, ROUTINE W REFLEX MICROSCOPIC - Abnormal; Notable for the following:    APPearance CLOUDY (*)    Hgb urine dipstick TRACE (*)    All other components within normal limits  URINE MICROSCOPIC-ADD ON - Abnormal; Notable for the following:    Squamous Epithelial  / LPF FEW (*)    All other components within normal limits  LIPASE, BLOOD  PREGNANCY, URINE    Imaging Review No results found.   EKG Interpretation None      MDM   Final diagnoses:  Abdominal pain, unspecified abdominal location    Unrevealing workup here. Had some symptom control with medications. Hydrated. Plan will be GI referral. Bentyl Zofran. Parenchymal bite" on her left shoulder is not impressive for infection or complication.    Rolland Porter, MD 04/29/14 5366  Rolland Porter, MD 04/29/14 2025

## 2014-05-21 ENCOUNTER — Ambulatory Visit (HOSPITAL_COMMUNITY)
Admission: AD | Admit: 2014-05-21 | Discharge: 2014-05-21 | Disposition: A | Discharge status: Home / Self Care | Payer: Self-pay | Source: Intra-hospital | Attending: Psychiatry | Admitting: Psychiatry

## 2014-05-21 ENCOUNTER — Emergency Department: Payer: Self-pay | Admitting: Emergency Medicine

## 2014-05-21 LAB — CBC
HCT: 37 % (ref 35.0–47.0)
HGB: 12 g/dL (ref 12.0–16.0)
MCH: 24.8 pg — ABNORMAL LOW (ref 26.0–34.0)
MCHC: 32.5 g/dL (ref 32.0–36.0)
MCV: 76 fL — ABNORMAL LOW (ref 80–100)
Platelet: 336 10*3/uL (ref 150–440)
RBC: 4.85 10*6/uL (ref 3.80–5.20)
RDW: 17.4 % — AB (ref 11.5–14.5)
WBC: 12.2 10*3/uL — ABNORMAL HIGH (ref 3.6–11.0)

## 2014-05-21 LAB — URINALYSIS, COMPLETE
Blood: NEGATIVE
Glucose,UR: NEGATIVE mg/dL (ref 0–75)
Leukocyte Esterase: NEGATIVE
Nitrite: NEGATIVE
PH: 5 (ref 4.5–8.0)
SPECIFIC GRAVITY: 1.043 (ref 1.003–1.030)
WBC UR: 2 /HPF (ref 0–5)

## 2014-05-21 LAB — ETHANOL
Ethanol %: <0.003 % (ref 0.000–0.080)
Ethanol: <3 mg/dL

## 2014-05-21 LAB — ACETAMINOPHEN LEVEL: Acetaminophen: 2 ug/mL — ABNORMAL LOW

## 2014-05-21 LAB — DRUG SCREEN, URINE
Amphetamines, Ur Screen: NEGATIVE (ref ?–1000)
BARBITURATES, UR SCREEN: NEGATIVE (ref ?–200)
Benzodiazepine, Ur Scrn: NEGATIVE (ref ?–200)
COCAINE METABOLITE, UR ~~LOC~~: NEGATIVE (ref ?–300)
Cannabinoid 50 Ng, Ur ~~LOC~~: POSITIVE (ref ?–50)
MDMA (ECSTASY) UR SCREEN: NEGATIVE (ref ?–500)
Methadone, Ur Screen: NEGATIVE (ref ?–300)
OPIATE, UR SCREEN: POSITIVE (ref ?–300)
Phencyclidine (PCP) Ur S: NEGATIVE (ref ?–25)
TRICYCLIC, UR SCREEN: NEGATIVE (ref ?–1000)

## 2014-05-21 LAB — COMPREHENSIVE METABOLIC PANEL
ALBUMIN: 3.5 g/dL (ref 3.4–5.0)
ALT: 17 U/L
ANION GAP: 8 (ref 7–16)
Alkaline Phosphatase: 92 U/L
BILIRUBIN TOTAL: 0.1 mg/dL — AB (ref 0.2–1.0)
BUN: 8 mg/dL (ref 7–18)
CALCIUM: 8.8 mg/dL (ref 8.5–10.1)
CREATININE: 0.95 mg/dL (ref 0.60–1.30)
Chloride: 111 mmol/L — ABNORMAL HIGH (ref 98–107)
Co2: 23 mmol/L (ref 21–32)
EGFR (Non-African Amer.): >60
Glucose: 144 mg/dL — ABNORMAL HIGH (ref 65–99)
Osmolality: 284 (ref 275–301)
POTASSIUM: 3.6 mmol/L (ref 3.5–5.1)
SGOT(AST): 14 U/L — ABNORMAL LOW (ref 15–37)
SODIUM: 142 mmol/L (ref 136–145)
TOTAL PROTEIN: 7.1 g/dL (ref 6.4–8.2)

## 2014-05-21 LAB — SALICYLATE LEVEL: Salicylates, Serum: 6.8 mg/dL — ABNORMAL HIGH

## 2014-05-22 ENCOUNTER — Encounter (HOSPITAL_COMMUNITY): Payer: Self-pay | Admitting: Emergency Medicine

## 2014-05-22 ENCOUNTER — Inpatient Hospital Stay (HOSPITAL_COMMUNITY)
Admission: AD | Admit: 2014-05-22 | Discharge: 2014-05-27 | DRG: 897 | Disposition: A | Discharge status: Home / Self Care | Payer: 59 | Source: Intra-hospital | Attending: Psychiatry | Admitting: Psychiatry

## 2014-05-22 ENCOUNTER — Emergency Department (HOSPITAL_COMMUNITY)
Admission: EM | Admit: 2014-05-22 | Discharge: 2014-05-22 | Disposition: A | Discharge status: Home / Self Care | Payer: Medicaid Other | Attending: Emergency Medicine | Admitting: Emergency Medicine

## 2014-05-22 DIAGNOSIS — G2581 Restless legs syndrome: Secondary | ICD-10-CM | POA: Diagnosis present

## 2014-05-22 DIAGNOSIS — F1994 Other psychoactive substance use, unspecified with psychoactive substance-induced mood disorder: Secondary | ICD-10-CM | POA: Diagnosis present

## 2014-05-22 DIAGNOSIS — G4700 Insomnia, unspecified: Secondary | ICD-10-CM | POA: Diagnosis present

## 2014-05-22 DIAGNOSIS — Z598 Other problems related to housing and economic circumstances: Secondary | ICD-10-CM

## 2014-05-22 DIAGNOSIS — F1210 Cannabis abuse, uncomplicated: Secondary | ICD-10-CM | POA: Insufficient documentation

## 2014-05-22 DIAGNOSIS — F329 Major depressive disorder, single episode, unspecified: Secondary | ICD-10-CM | POA: Diagnosis present

## 2014-05-22 DIAGNOSIS — Z5987 Material hardship due to limited financial resources, not elsewhere classified: Secondary | ICD-10-CM

## 2014-05-22 DIAGNOSIS — F39 Unspecified mood [affective] disorder: Secondary | ICD-10-CM | POA: Diagnosis present

## 2014-05-22 DIAGNOSIS — Z8659 Personal history of other mental and behavioral disorders: Secondary | ICD-10-CM | POA: Insufficient documentation

## 2014-05-22 DIAGNOSIS — Z818 Family history of other mental and behavioral disorders: Secondary | ICD-10-CM

## 2014-05-22 DIAGNOSIS — Z91199 Patient's noncompliance with other medical treatment and regimen due to unspecified reason: Secondary | ICD-10-CM | POA: Diagnosis not present

## 2014-05-22 DIAGNOSIS — Z3202 Encounter for pregnancy test, result negative: Secondary | ICD-10-CM | POA: Insufficient documentation

## 2014-05-22 DIAGNOSIS — F17200 Nicotine dependence, unspecified, uncomplicated: Secondary | ICD-10-CM | POA: Diagnosis present

## 2014-05-22 DIAGNOSIS — F411 Generalized anxiety disorder: Secondary | ICD-10-CM | POA: Diagnosis present

## 2014-05-22 DIAGNOSIS — J4489 Other specified chronic obstructive pulmonary disease: Secondary | ICD-10-CM | POA: Insufficient documentation

## 2014-05-22 DIAGNOSIS — Z609 Problem related to social environment, unspecified: Secondary | ICD-10-CM

## 2014-05-22 DIAGNOSIS — Z008 Encounter for other general examination: Secondary | ICD-10-CM | POA: Insufficient documentation

## 2014-05-22 DIAGNOSIS — Z862 Personal history of diseases of the blood and blood-forming organs and certain disorders involving the immune mechanism: Secondary | ICD-10-CM | POA: Insufficient documentation

## 2014-05-22 DIAGNOSIS — Z880 Allergy status to penicillin: Secondary | ICD-10-CM | POA: Insufficient documentation

## 2014-05-22 DIAGNOSIS — F1120 Opioid dependence, uncomplicated: Secondary | ICD-10-CM | POA: Diagnosis present

## 2014-05-22 DIAGNOSIS — F4000 Agoraphobia, unspecified: Secondary | ICD-10-CM | POA: Diagnosis present

## 2014-05-22 DIAGNOSIS — F319 Bipolar disorder, unspecified: Secondary | ICD-10-CM | POA: Diagnosis present

## 2014-05-22 DIAGNOSIS — F1123 Opioid dependence with withdrawal: Secondary | ICD-10-CM

## 2014-05-22 DIAGNOSIS — J449 Chronic obstructive pulmonary disease, unspecified: Secondary | ICD-10-CM | POA: Diagnosis present

## 2014-05-22 DIAGNOSIS — E7800 Pure hypercholesterolemia, unspecified: Secondary | ICD-10-CM | POA: Diagnosis present

## 2014-05-22 DIAGNOSIS — E669 Obesity, unspecified: Secondary | ICD-10-CM | POA: Insufficient documentation

## 2014-05-22 DIAGNOSIS — F3289 Other specified depressive episodes: Secondary | ICD-10-CM | POA: Insufficient documentation

## 2014-05-22 DIAGNOSIS — R45851 Suicidal ideations: Secondary | ICD-10-CM | POA: Insufficient documentation

## 2014-05-22 DIAGNOSIS — Z9119 Patient's noncompliance with other medical treatment and regimen: Secondary | ICD-10-CM | POA: Diagnosis not present

## 2014-05-22 DIAGNOSIS — Z8679 Personal history of other diseases of the circulatory system: Secondary | ICD-10-CM | POA: Insufficient documentation

## 2014-05-22 DIAGNOSIS — Z5989 Other problems related to housing and economic circumstances: Secondary | ICD-10-CM | POA: Diagnosis not present

## 2014-05-22 DIAGNOSIS — B820 Intestinal helminthiasis, unspecified: Secondary | ICD-10-CM | POA: Insufficient documentation

## 2014-05-22 LAB — CBC
HCT: 35.5 % — ABNORMAL LOW (ref 36.0–46.0)
Hemoglobin: 11.8 g/dL — ABNORMAL LOW (ref 12.0–15.0)
MCH: 25.5 pg — AB (ref 26.0–34.0)
MCHC: 33.2 g/dL (ref 30.0–36.0)
MCV: 76.8 fL — ABNORMAL LOW (ref 78.0–100.0)
PLATELETS: 285 10*3/uL (ref 150–400)
RBC: 4.62 MIL/uL (ref 3.87–5.11)
RDW: 16.2 % — ABNORMAL HIGH (ref 11.5–15.5)
WBC: 12.1 10*3/uL — ABNORMAL HIGH (ref 4.0–10.5)

## 2014-05-22 LAB — RAPID URINE DRUG SCREEN, HOSP PERFORMED
Amphetamines: NOT DETECTED
Barbiturates: NOT DETECTED
Benzodiazepines: NOT DETECTED
Cocaine: NOT DETECTED
OPIATES: POSITIVE — AB
Tetrahydrocannabinol: POSITIVE — AB

## 2014-05-22 LAB — COMPREHENSIVE METABOLIC PANEL
ALT: 10 U/L (ref 0–35)
AST: 13 U/L (ref 0–37)
Albumin: 3.5 g/dL (ref 3.5–5.2)
Alkaline Phosphatase: 94 U/L (ref 39–117)
Anion gap: 18 — ABNORMAL HIGH (ref 5–15)
BUN: 9 mg/dL (ref 6–23)
CALCIUM: 9.1 mg/dL (ref 8.4–10.5)
CO2: 17 mEq/L — ABNORMAL LOW (ref 19–32)
Chloride: 103 mEq/L (ref 96–112)
Creatinine, Ser: 0.68 mg/dL (ref 0.50–1.10)
GFR calc non Af Amer: >90 mL/min (ref >90)
GLUCOSE: 153 mg/dL — AB (ref 70–99)
Potassium: 3.7 mEq/L (ref 3.7–5.3)
SODIUM: 138 meq/L (ref 137–147)
TOTAL PROTEIN: 7.1 g/dL (ref 6.0–8.3)
Total Bilirubin: <0.2 mg/dL — ABNORMAL LOW (ref 0.3–1.2)

## 2014-05-22 LAB — POC URINE PREG, ED: Preg Test, Ur: NEGATIVE

## 2014-05-22 LAB — ETHANOL

## 2014-05-22 MED ORDER — CLONIDINE HCL 0.1 MG PO TABS: 0.1000 mg | ORAL_TABLET | ORAL | Status: DC

## 2014-05-22 MED ORDER — ONDANSETRON 4 MG PO TBDP
4.0000 mg | ORAL_TABLET | Freq: Four times a day (QID) | ORAL | Status: AC | PRN
  Administered 2014-05-22 – 2014-05-27 (×9): 4 mg via ORAL
  Filled 2014-05-22 (×10): qty 1

## 2014-05-22 MED ORDER — NAPROXEN 500 MG PO TABS: 500.0000 mg | ORAL_TABLET | Freq: Two times a day (BID) | ORAL | Status: DC | PRN

## 2014-05-22 MED ORDER — METHOCARBAMOL 500 MG PO TABS
500.0000 mg | ORAL_TABLET | Freq: Three times a day (TID) | ORAL | Status: AC | PRN
  Administered 2014-05-22 – 2014-05-26 (×10): 500 mg via ORAL
  Filled 2014-05-22 (×11): qty 1

## 2014-05-22 MED ORDER — HYDROXYZINE HCL 25 MG PO TABS
25.0000 mg | ORAL_TABLET | Freq: Four times a day (QID) | ORAL | Status: DC | PRN
  Administered 2014-05-22: 25 mg via ORAL
  Filled 2014-05-22: qty 1

## 2014-05-22 MED ORDER — CLONIDINE HCL 0.1 MG PO TABS
ORAL_TABLET | ORAL | Status: AC
  Filled 2014-05-22: qty 1

## 2014-05-22 MED ORDER — CLONIDINE HCL 0.1 MG PO TABS
ORAL_TABLET | ORAL | Status: AC
  Administered 2014-05-22: 0.1 mg via ORAL
  Filled 2014-05-22: qty 1

## 2014-05-22 MED ORDER — HYDROXYZINE HCL 25 MG PO TABS
ORAL_TABLET | ORAL | Status: AC
  Filled 2014-05-22: qty 1

## 2014-05-22 MED ORDER — ALUM & MAG HYDROXIDE-SIMETH 200-200-20 MG/5ML PO SUSP: 30.0000 mL | ORAL | Status: DC | PRN

## 2014-05-22 MED ORDER — LOPERAMIDE HCL 2 MG PO CAPS: 2.0000 mg | ORAL_CAPSULE | ORAL | Status: DC | PRN

## 2014-05-22 MED ORDER — ACETAMINOPHEN 325 MG PO TABS
650.0000 mg | ORAL_TABLET | Freq: Four times a day (QID) | ORAL | Status: DC | PRN
  Administered 2014-05-24 – 2014-05-25 (×3): 650 mg via ORAL
  Filled 2014-05-22 (×3): qty 2

## 2014-05-22 MED ORDER — HYDROXYZINE HCL 25 MG PO TABS
25.0000 mg | ORAL_TABLET | Freq: Four times a day (QID) | ORAL | Status: AC | PRN
Start: 2014-05-22 — End: 2014-05-27
  Administered 2014-05-22 – 2014-05-26 (×16): 25 mg via ORAL
  Filled 2014-05-22 (×12): qty 1
  Filled 2014-05-22: qty 40
  Filled 2014-05-22 (×5): qty 1

## 2014-05-22 MED ORDER — DICYCLOMINE HCL 20 MG PO TABS
20.0000 mg | ORAL_TABLET | Freq: Four times a day (QID) | ORAL | Status: AC | PRN
  Administered 2014-05-22 – 2014-05-27 (×14): 20 mg via ORAL
  Filled 2014-05-22 (×14): qty 1

## 2014-05-22 MED ORDER — CLONIDINE HCL 0.1 MG PO TABS
0.1000 mg | ORAL_TABLET | ORAL | Status: AC
  Administered 2014-05-24 – 2014-05-25 (×3): 0.1 mg via ORAL
  Filled 2014-05-22 (×3): qty 1

## 2014-05-22 MED ORDER — TRAZODONE HCL 50 MG PO TABS
50.0000 mg | ORAL_TABLET | Freq: Every evening | ORAL | Status: DC | PRN
  Administered 2014-05-22 – 2014-05-26 (×8): 50 mg via ORAL
  Filled 2014-05-22: qty 1
  Filled 2014-05-22: qty 28
  Filled 2014-05-22 (×7): qty 1
  Filled 2014-05-22: qty 28
  Filled 2014-05-22: qty 1
  Filled 2014-05-22 (×2): qty 28
  Filled 2014-05-22 (×5): qty 1

## 2014-05-22 MED ORDER — ONDANSETRON 4 MG PO TBDP
4.0000 mg | ORAL_TABLET | Freq: Four times a day (QID) | ORAL | Status: DC | PRN
  Administered 2014-05-22: 4 mg via ORAL
  Filled 2014-05-22: qty 1

## 2014-05-22 MED ORDER — MAGNESIUM HYDROXIDE 400 MG/5ML PO SUSP
30.0000 mL | Freq: Every day | ORAL | Status: DC | PRN
  Administered 2014-05-23 – 2014-05-25 (×2): 30 mL via ORAL

## 2014-05-22 MED ORDER — CLONIDINE HCL 0.1 MG PO TABS: 0.1000 mg | ORAL_TABLET | Freq: Four times a day (QID) | ORAL | Status: DC

## 2014-05-22 MED ORDER — CLONIDINE HCL 0.1 MG PO TABS
0.1000 mg | ORAL_TABLET | Freq: Four times a day (QID) | ORAL | Status: AC
  Administered 2014-05-22 – 2014-05-24 (×8): 0.1 mg via ORAL
  Filled 2014-05-22 (×14): qty 1

## 2014-05-22 MED ORDER — METHOCARBAMOL 500 MG PO TABS
500.0000 mg | ORAL_TABLET | Freq: Three times a day (TID) | ORAL | Status: DC | PRN
  Administered 2014-05-22: 500 mg via ORAL
  Filled 2014-05-22: qty 1

## 2014-05-22 MED ORDER — CLONIDINE HCL 0.1 MG PO TABS: 0.1000 mg | ORAL_TABLET | Freq: Every day | ORAL | Status: DC

## 2014-05-22 MED ORDER — NAPROXEN 500 MG PO TABS
500.0000 mg | ORAL_TABLET | Freq: Two times a day (BID) | ORAL | Status: DC | PRN
  Administered 2014-05-22 – 2014-05-25 (×6): 500 mg via ORAL
  Filled 2014-05-22 (×6): qty 1

## 2014-05-22 MED ORDER — LOPERAMIDE HCL 2 MG PO CAPS: 2.0000 mg | ORAL_CAPSULE | ORAL | Status: AC | PRN

## 2014-05-22 MED ORDER — CLONIDINE HCL 0.1 MG PO TABS
0.1000 mg | ORAL_TABLET | Freq: Every day | ORAL | Status: AC
  Administered 2014-05-26 – 2014-05-27 (×2): 0.1 mg via ORAL
  Filled 2014-05-22 (×2): qty 1

## 2014-05-22 MED ORDER — DICYCLOMINE HCL 20 MG PO TABS
20.0000 mg | ORAL_TABLET | Freq: Four times a day (QID) | ORAL | Status: DC | PRN
  Administered 2014-05-22: 20 mg via ORAL
  Filled 2014-05-22: qty 1

## 2014-05-22 NOTE — ED Notes (Addendum)
1 pt belonging bag, 1 bookbag in locker #27

## 2014-05-22 NOTE — ED Notes (Signed)
bookbag & 1 pt belonging bag given to [facility], pt tx to [facility] obs unit

## 2014-05-22 NOTE — BH Assessment (Signed)
Assessment Note  Shelby Hatfield is an 38 y.o. female.  -Patient came to [facility] as a walk in.  Accompanied by mother and boyfriend.  Patient is tearful during assessment.  She feels that her children talk back to her and do not care about her.  She has two children living with her and one grandchild.  The 99 year old daughter does not take care of her own daughter and told mother that she was pregnant again.  Patient said that she feels that her children take advantage of her.  Patient has been so discouraged and upset that yesterday she told her mother she wanted to walk out into traffic to kill herself.  Patient currently feels this way and cannot contract for safety.  Financial problems are a stressor for her also.  Patient has been using heroin by snorting about two bags per day or the last 5 months.  Patient last used on 08/23 and used two bags.  Percocet and roxy use is 100-120 mg per day for the last two years.  Patient last use was 08/24 in AM.  Patient uses marijuana about a joint per week with last use being 08/23 using three puffs.  Patient denies HI.  She said that at times she feels like she is being watched.  Patient however denies A/V hallucinations.  Patient has been to [facility] about a year ago.  She did go to [facility] after discharge and went twice for medication management.  She said that her primary care doctor said that he could prescribe her meds.  She said however that he stopped seeing her about three months ago and she has been without psychiatric meds for that amount of time.  -Pt care discussed with Donell Sievert, PA who recommended inpatient care for patient.  Due to recent drug use and not having beds available on the unit, patient was sent to [facility] for medical clearance.  TTS will seek placement if no bed is available at [facility].  Axis I: Major Depression, Recurrent severe and Substance Abuse Axis II: Deferred Axis III:  Past Medical History  Diagnosis Date  . Bipolar 1 disorder   .  Migraines   . Wears dentures     upper denture  . Adenotonsillar hypertrophy 03/2012    snores during sleep; denies apnea; states occ. wakes up coughing  . COPD (chronic obstructive pulmonary disease)   . Anemia   . Depression   . Anxiety   . Obesity   . Hypercholesterolemia   . Pneumonia   . Pancreatitis    Axis IV: economic problems, occupational problems, other psychosocial or environmental problems and problems with access to health care services Axis V: 31-40 impairment in reality testing  Past Medical History:  Past Medical History  Diagnosis Date  . Bipolar 1 disorder   . Migraines   . Wears dentures     upper denture  . Adenotonsillar hypertrophy 03/2012    snores during sleep; denies apnea; states occ. wakes up coughing  . COPD (chronic obstructive pulmonary disease)   . Anemia   . Depression   . Anxiety   . Obesity   . Hypercholesterolemia   . Pneumonia   . Pancreatitis     Past Surgical History  Procedure Laterality Date  . Cesarean section  08/31/2001    total of  3  . Appendectomy    . Cholecystectomy    . Tubal ligation  08/31/2001  . Dilation and evacuation  04/09/2000  . Tonsillectomy  and adenoidectomy  04/12/2012    Procedure: TONSILLECTOMY AND ADENOIDECTOMY;  Surgeon: Darletta Moll, MD;  Location: [facility];  Service: ENT;  Laterality: Bilateral;  . Tonsillectomy      Family History:  Family History  Problem Relation Age of Onset  . Cancer Other     Social History:  reports that she has been smoking Cigarettes.  She has a 5.94 pack-year smoking history. She has never used smokeless tobacco. She reports that she does not drink alcohol or use illicit drugs.  Additional Social History:     CIWA:   COWS:    Allergies:  Allergies  Allergen Reactions  . Penicillins Itching and Rash    Home Medications:  (Not in a hospital admission)  OB/GYN Status:  Patient's last menstrual period was 04/22/2014.                                                                Disposition:     On Site Evaluation by:   Reviewed with Physician:    Beatriz Stallion Ray 05/22/2014 5:27 AM

## 2014-05-22 NOTE — Progress Notes (Signed)
Adult Psychoeducational Group Note  Date:  05/22/2014 Time:  9:41 PM  Group Topic/Focus:  Wrap-Up Group:   The focus of this group is to help patients review their daily goal of treatment and discuss progress on daily workbooks.  Participation Level:  Did Not Attend  Participation Quality:  Drowsy  Affect:  Flat and Labile  Cognitive:  Confused  Insight: None  Engagement in Group:  None  Modes of Intervention:  None  Additional Comments:  Pt decided to stay in bed instead of coming to group   Kiki Bivens R 05/22/2014, 9:41 PM

## 2014-05-22 NOTE — Progress Notes (Signed)
Recreation Therapy Notes  Animal-Assisted Activity/Therapy (AAA/T) Program Checklist/Progress Notes Patient Eligibility Criteria Checklist & Daily Group note for Rec Tx Intervention  Date: 08.25.2015 Time: 2:45pm Location: [address], applications   AAA/T Program Assumption of Risk Form signed by Patient/ or Parent Legal Guardian yes  Patient is free of allergies or sever asthma yes  Patient reports no fear of animals yes  Patient reports no history of cruelty to animals yes   Patient understands his/her participation is voluntary yes  Patient washes hands before animal contact yes  Patient washes hands after animal contact yes  Behavioral Response: Appropriate   Education: Hand Washing, Appropriate Animal Interaction   Education Outcome: Acknowledges understanding   Clinical Observations/Feedback: Patient newly arrived to unit, upon entering session patient interacted with therapy dog appropriately. Patient inquired about PRN medications she was receiving in OBS unit. LRT directed patient to ask her RN about any medication related questions.    Marykay Lex Jacyln Carmer, LRT/CTRS  Trooper Olander L 05/22/2014 4:08 PM

## 2014-05-22 NOTE — Tx Team (Signed)
Initial Interdisciplinary Treatment Plan   PATIENT STRESSORS: Substance abuse   PROBLEM LIST: Problem List/Patient Goals Date to be addressed Date deferred Reason deferred Estimated date of resolution  Substance Abuse 05/22/2014    D/C  Potential For Self Harm  05/22/2014    D/C  Med Non-Compliance 05/22/2014    Ongoing   Depression 05/22/2014    D/C                                 DISCHARGE CRITERIA:  Improved stabilization in mood, thinking, and/or behavior Motivation to continue treatment in a less acute level of care Verbal commitment to aftercare and medication compliance Withdrawal symptoms are absent or subacute and managed without 24-hour nursing intervention  PRELIMINARY DISCHARGE PLAN: Attend 12-step recovery group Return to previous living arrangement Referrals indicated:  Substance Abuse  PATIENT/FAMIILY INVOLVEMENT: This treatment plan has been presented to and reviewed with the patient, Shelby Hatfield.  The patient and family have been given the opportunity to ask questions and make suggestions.  Shelby Hatfield 05/22/2014, 2:26 PM

## 2014-05-22 NOTE — ED Notes (Signed)
[facility] tx called

## 2014-05-22 NOTE — Progress Notes (Signed)
Patient ID: Shelby Hatfield, female   DOB: 1976/09/12, 38 y.o.   MRN: [id number] Met with patient and discussed case with Nursing Staff. Patient is a 38 year old woman, with a history of opiate dependence, who presents for assistance with opiate detoxification. She is currently unemployed and lives with her adult daughter and grandchildren. She has been using percocets, roxicets and heroin in varying amounts, depending on availability. She denies IV use, and has been " snorting" above medications/substances. She states she is " very tired of using drugs" and wants help. She states she fears daughter will not allow her to see her grandchild any longer if she does not seek sobriety. She presents with significant opiate withdrawal symptoms at this time, and as  Per Nursing she was scored 17 on COWS earlier. Symptoms include rhinorrea, headache, watery eyes, significant cramping and muscle aches, nausea, diarrhea. Vitals are stable- pulse 77, BP 123/75, Temp 98.6 Patient is visibly uncomfortable, restless, vaguely agitated, labile , tearful. Thought process is linear. Mood is dysphoric and affect labile. She is denying any suicidal or homicidal ideations, denies hallucinations, no delusions are expressed.  Assessment- patient has a history of opiate dependence, and is presenting with significant withdrawal symptoms and dysphoria.  Plan- Patient merits inpatient admission for treatment of withdrawal and to monitor mood and affect. She is agreeing to admission and plans to sign voluntary admission papers. Start Opiate withdrawal protocol with clonidine and symptomatic relief. Patient agrees to treatment.

## 2014-05-22 NOTE — Progress Notes (Signed)
D: Patient in the bed on approach.  Patient states she had a bad day.  Patient states she continues to have withdrawal symptoms.  Patient irritable and angry when speaking to Clinical research associate.  Patient states this is the worst she has ever felt.  Patient states she is passive SI but verbally contracts for safety.  Patient denies HI and AVH. A: Staff to monitor Q 15 mins for safety.  Encouragement and support offered.  Scheduled medications administered per orders.  Patient given several prn medications tonight for withdrawal symptoms. R: Patient remains safe on the unit.  Patient did not attend group tonight.  Patient visible on the unit and interacting with peers.  Patient taking administered medications.

## 2014-05-22 NOTE — Progress Notes (Signed)
38 year old female admitted from [facility] requesting detox from opiates. Patient is using between 1-2 gr of heroin daily along with 120 mg Roxy/Percocet. Patient's longest period of sobriety was 3 years. Patient has been off of her psychiatric medicine for 3 months and endorses manic episodes in the past. Patient is currently living with her 37 year old daughter , patient's younger daughter and two grandchildren. Patient has a supportive mother. Patient endorses passive SI with no plan; patient contracts for safety. Dr. Jama Flavors at bedside to assess patient and verbal orders received to begin the clonidine protocal. COWS rated at 17; runny nose, agitation, restlessness, piloerection, sweating, nausea , generalized body aches, and tearfulness. Patient stating "I feel as though my head will blow off." Patient is cooperative and receptive to support.

## 2014-05-22 NOTE — Plan of Care (Signed)
[facility] Observation Crisis Plan  Reason for Crisis Plan:  Substance Abuse   Plan of Care:  Referral for Inpatient Hospitalization  Family Support:      Current Living Environment:     Insurance:   Hospital Account   Name Acct ID Class Status Primary Coverage   Etha, [facility] [id number] BEHAVIORAL HEALTH OBSERVATION Open None        Guarantor Account (for Hospital Account [id number])   Name Relation to Pt Service Area Active? Acct Type   Shelby Hatfield Self [facility] Yes Behavioral Health   Address Phone       [address]  [place], [place] [address] [phone](H)          Coverage Information (for Hospital Account [id number])   Not on file      Legal Guardian:     Primary Care Provider:  Lonia Blood, MD  Current Outpatient Providers:  Vesta Mixer  Psychiatrist:     Counselor/Therapist:     Compliant with Medications:  No  Additional Information:   Loren Racer 8/25/201510:59 AM

## 2014-05-22 NOTE — ED Notes (Signed)
Pt was sent from [facility], they have no beds until the morning, she states that she feels useless and her kids talk to her ugly. She had an opiate addiction and states that she has thought about walking out in traffic. Pt very tearful in traige.

## 2014-05-22 NOTE — Progress Notes (Addendum)
Patient ID: Shelby Hatfield, female   DOB: 1975/10/02, 38 y.o.   MRN: [id number] Patient is admitted to the Adult unit from an Obs bed.  She is cooperative but says she feels terrible.  She is wanting detox and says she remembers alot from when she was here previously.  Patient contracts for safety.  She is asking when she will get her regular medications which she listed as lamictal, prozac, neurontin, zyprexa and trazadone.  Patient shown to her room .  She is wanting to make phone calls.  MD informed of her arrival

## 2014-05-22 NOTE — Progress Notes (Signed)
Patient is resting soundly after PRN medications were administered. The registration tech had difficulty arousing patient. Patient will be transferring to Adult Unit. Report given to Neill Loft RN.

## 2014-05-22 NOTE — Progress Notes (Signed)
Patient ID: Shelby Hatfield, female   DOB: [birth year], 38 y.o.   MRN: [id number] [facility] INPATIENT:  Family/Significant Other Suicide Prevention Education  Suicide Prevention Education:  Patient Refusal for Family/Significant Other Suicide Prevention Education: The patient Shelby Hatfield has refused to provide written consent for family/significant other to be provided Family/Significant Other Suicide Prevention Education during admission and/or prior to discharge.  Physician notified.  Loren Racer 05/22/2014, 11:04 AM

## 2014-05-22 NOTE — ED Notes (Signed)
Pt is anxious, pt sts she has generalized pain, sts she is still having thoughts of hurting herself, resting in room, given a coke

## 2014-05-22 NOTE — Tx Team (Signed)
Initial Interdisciplinary Treatment Plan   PATIENT STRESSORS: Financial difficulties Marital or family conflict Substance abuse   PROBLEM LIST: Problem List/Patient Goals Date to be addressed Date deferred Reason deferred Estimated date of resolution  "substance abuse" 05/22/14   D/c  "depression" 05/22/14   D/c                                             DISCHARGE CRITERIA:  Adequate post-discharge living arrangements Improved stabilization in mood, thinking, and/or behavior Motivation to continue treatment in a less acute level of care Reduction of life-threatening or endangering symptoms to within safe limits Verbal commitment to aftercare and medication compliance  PRELIMINARY DISCHARGE PLAN: Attend aftercare/continuing care group Attend 12-step recovery group  PATIENT/FAMIILY INVOLVEMENT: This treatment plan has been presented to and reviewed with the patient, ROIZY HAROLD, and/or family member, The patient's strengths are that she wants help and detox and that she is assertive.  The patient and family have been given the opportunity to ask questions and make suggestions.  Ralph Leyden 05/22/2014, 3:31 PM

## 2014-05-22 NOTE — ED Provider Notes (Signed)
CSN: [id number]     Arrival date & time 05/22/14  0050 History   First MD Initiated Contact with Patient 05/22/14 0203     Chief Complaint  Patient presents with  . Drug Problem   HPI  History provided by the patient. Patient is a 38 year old female with history of bipolar disorder, COPD and opiate abuse who presents with request for help with her opiate addiction. Patient also states that he has had worsening depression and suicidal thoughts. She states she has been seen by walking into traffic to just end it all. Denies any other aggravating or alleviating factors. No other associated symptoms. Denies any alcohol use or other drug use. She has been using heroin by snorting it for the past 5-6 months. Also using occasional opiate pain medications.    Past Medical History  Diagnosis Date  . Bipolar 1 disorder   . Migraines   . Wears dentures     upper denture  . Adenotonsillar hypertrophy 03/2012    snores during sleep; denies apnea; states occ. wakes up coughing  . COPD (chronic obstructive pulmonary disease)   . Anemia   . Depression   . Anxiety   . Obesity   . Hypercholesterolemia   . Pneumonia   . Pancreatitis    Past Surgical History  Procedure Laterality Date  . Cesarean section  08/31/2001    total of  3  . Appendectomy    . Cholecystectomy    . Tubal ligation  08/31/2001  . Dilation and evacuation  04/09/2000  . Tonsillectomy and adenoidectomy  04/12/2012    Procedure: TONSILLECTOMY AND ADENOIDECTOMY;  Surgeon: Darletta Moll, MD;  Location: [facility];  Service: ENT;  Laterality: Bilateral;  . Tonsillectomy     Family History  Problem Relation Age of Onset  . Cancer Other    History  Substance Use Topics  . Smoking status: Current Every Day Smoker -- 0.33 packs/day for 18 years    Types: Cigarettes  . Smokeless tobacco: Never Used  . Alcohol Use: No   OB History   Grav Para Term Preterm Abortions TAB SAB Ect Mult Living   Review of Systems  All other systems reviewed and are negative.     Allergies  Penicillins  Home Medications   Prior to Admission medications   Medication Sig Start Date End Date Taking? Authorizing Provider  albuterol (PROVENTIL HFA;VENTOLIN HFA) 108 (90 BASE) MCG/ACT inhaler Inhale 2 puffs into the lungs 3 (three) times daily as needed for wheezing or shortness of breath.   Yes Historical Provider, MD  tiotropium (SPIRIVA) 18 MCG inhalation capsule Place 18 mcg into inhaler and inhale daily.   Yes Historical Provider, MD   BP 136/87  Pulse 83  Temp(Src) 98.5 F (36.9 C) (Oral)  Resp 22  Wt 220 lb (99.791 kg)  SpO2 100%  LMP 04/22/2014 Physical Exam  Nursing note and vitals reviewed. Constitutional: She is oriented to person, place, and time. She appears well-developed and well-nourished. No distress.  HENT:  Head: Normocephalic.  Cardiovascular: Normal rate and regular rhythm.   Pulmonary/Chest: Effort normal and breath sounds normal.  Abdominal: Soft.  Neurological: She is alert and oriented to person, place, and time.  Skin: Skin is warm and dry. No rash noted.  Psychiatric: Her behavior is normal. She exhibits a depressed mood. She expresses suicidal ideation.    ED Course  Procedures  COORDINATION OF CARE:  Nursing notes reviewed. Vital signs reviewed. Initial pt interview and examination performed.   Filed Vitals:   05/22/14 0142  BP: 136/87  Pulse: 83  Temp: 98.5 F (36.9 C)  TempSrc: Oral  Resp: 22  Weight: 220 lb (99.791 kg)  SpO2: 100%    2:50 AM-patient seen and evaluated. Patient calm cooperative no acute distress.  TTS consult placed. Psychiatric holding orders and placed  Patient medically cleared for further psychiatric evaluation  Treatment plan initiated:Medications - No data to display  Results for orders placed during the hospital encounter of 05/22/14  CBC      Result Value Ref Range   WBC 12.1 (*) 4.0 - 10.5 K/uL   RBC  4.62  3.87 - 5.11 MIL/uL   Hemoglobin 11.8 (*) 12.0 - 15.0 g/dL   HCT 40.9 (*) 81.1 - 91.4 %   MCV 76.8 (*) 78.0 - 100.0 fL   MCH 25.5 (*) 26.0 - 34.0 pg   MCHC 33.2  30.0 - 36.0 g/dL   RDW 78.2 (*) 95.6 - 21.3 %   Platelets 285  150 - 400 K/uL  COMPREHENSIVE METABOLIC PANEL      Result Value Ref Range   Sodium 138  137 - 147 mEq/L   Potassium 3.7  3.7 - 5.3 mEq/L   Chloride 103  96 - 112 mEq/L   CO2 17 (*) 19 - 32 mEq/L   Glucose, Bld 153 (*) 70 - 99 mg/dL   BUN 9  6 - 23 mg/dL   Creatinine, Ser 0.86  0.50 - 1.10 mg/dL   Calcium 9.1  8.4 - 57.8 mg/dL   Total Protein 7.1  6.0 - 8.3 g/dL   Albumin 3.5  3.5 - 5.2 g/dL   AST 13  0 - 37 U/L   ALT 10  0 - 35 U/L   Alkaline Phosphatase 94  39 - 117 U/L   Total Bilirubin <0.2 (*) 0.3 - 1.2 mg/dL   GFR calc non Af Amer >90  >90 mL/min   GFR calc Af Amer >90  >90 mL/min   Anion gap 18 (*) 5 - 15  ETHANOL      Result Value Ref Range   Alcohol, Ethyl (B) <11  0 - 11 mg/dL  URINE RAPID DRUG SCREEN (HOSP PERFORMED)      Result Value Ref Range   Opiates POSITIVE (*) NONE DETECTED   Cocaine NONE DETECTED  NONE DETECTED   Benzodiazepines NONE DETECTED  NONE DETECTED   Amphetamines NONE DETECTED  NONE DETECTED   Tetrahydrocannabinol POSITIVE (*) NONE DETECTED   Barbiturates NONE DETECTED  NONE DETECTED  POC URINE PREG, ED      Result Value Ref Range   Preg Test, Ur NEGATIVE  NEGATIVE        MDM   Final diagnoses:  Suicidal ideation  Uncomplicated opioid dependence       Angus Seller, PA-C 05/22/14 0601

## 2014-05-23 MED ORDER — GABAPENTIN 100 MG PO CAPS
100.0000 mg | ORAL_CAPSULE | Freq: Three times a day (TID) | ORAL | Status: DC
  Administered 2014-05-23 – 2014-05-25 (×7): 100 mg via ORAL
  Filled 2014-05-23 (×12): qty 1

## 2014-05-23 MED ORDER — FLUOXETINE HCL 20 MG PO CAPS
20.0000 mg | ORAL_CAPSULE | Freq: Every day | ORAL | Status: DC
  Administered 2014-05-23 – 2014-05-27 (×5): 20 mg via ORAL
  Filled 2014-05-23 (×3): qty 1
  Filled 2014-05-23: qty 14
  Filled 2014-05-23 (×4): qty 1
  Filled 2014-05-23: qty 14

## 2014-05-23 MED ORDER — NICOTINE 21 MG/24HR TD PT24
21.0000 mg | MEDICATED_PATCH | Freq: Every day | TRANSDERMAL | Status: DC
  Administered 2014-05-23 – 2014-05-27 (×5): 21 mg via TRANSDERMAL
  Filled 2014-05-23 (×7): qty 1

## 2014-05-23 MED ORDER — LAMOTRIGINE 25 MG PO TABS
25.0000 mg | ORAL_TABLET | Freq: Every day | ORAL | Status: DC
  Administered 2014-05-23 – 2014-05-27 (×5): 25 mg via ORAL
  Filled 2014-05-23 (×4): qty 1
  Filled 2014-05-23: qty 14
  Filled 2014-05-23 (×2): qty 1
  Filled 2014-05-23: qty 14
  Filled 2014-05-23: qty 1

## 2014-05-23 NOTE — Progress Notes (Signed)
D: Patient in the hallway on first approach.  Patient less irritable today and she states she has hard a hard time with detox.  Patient opened up to writer about her drug use.  Patient states she wants to get her life together for her children and her grandchildren.  Patient pleasant and cooperative tonight and continues to complain of opiate withdrawal.  Patient denies SI/HI and denies AVH. A: Staff to monitor Q 15 mins for safety.  Encouragement and support offered.  Scheduled medications administered per orders. R: Patient remains safe on the unit.  Patient did not attend group tonight. Patient visible on the unit and interacting with peers.  Patient taking administered medications.

## 2014-05-23 NOTE — [facility] Group Notes (Addendum)
[facility] LCSW Aftercare Discharge Planning Group Note  05/23/2014  8:45 AM  Participation Quality: Did Not Attend- patient observed sleeping in bed.  Samuella Bruin, MSW, [employer] Clinical Social Worker [facility] [phone]

## 2014-05-23 NOTE — [facility] Group Notes (Signed)
`  [facility] LCSW Group Therapy 05/23/2014  1:15 PM   Type of Therapy: Group Therapy  Participation Level: Did Not Attend.   Samuella Bruin, MSW, [employer] Clinical Social Worker [facility] [phone]

## 2014-05-23 NOTE — Progress Notes (Signed)
Patient ID: Shelby Hatfield, female   DOB: Jun 06, 1976, 38 y.o.   MRN: [id number]  D: Pt. Denies SI/HI and A/V Hallucinations to writer during morning assessment. However, on patient's daily inventory sheet she reports having SI today at times. Patient contracts for safety at this time. Patient does report generalized pain due to withdrawal and moderate withdrawal symptoms bringing patient's COWS score this morning to an 11. Patient reports that she is not feeling well. Patient is mainly staying in bed this morning but did come to the medication window for her morning medication. Patient rates her depression, hopelessness, and anxiety at 9/10 for the day.   A: Support and encouragement provided to the patient to let writer know if patient needs anything and Clinical research associate will be able to give PRN's to patient to help relieve her withdrawal symptoms when the medications can be administered again. Scheduled medication was administered per physician's orders.  R: Patient is receptive and cooperative but minimal and guarded. Patient did not attend morning groups. Q15 minute checks are maintained for safety.

## 2014-05-23 NOTE — Progress Notes (Signed)
Didn't attend group 
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
## 2014-05-23 NOTE — Progress Notes (Signed)
Adult Psychoeducational Group Note  Date:  05/23/2014 Time:  10:29 AM  Group Topic/Focus:  Personal Choices and Values:   The focus of this group is to help patients assess and explore the importance of values in their lives, how their values affect their decisions, how they express their values and what opposes their expression.  Participation Level:  Did Not Attend  Additional Comments: Pt did not attend group.  Berlin Hun 05/23/2014, 10:29 AM
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
## 2014-05-24 MED ORDER — LORAZEPAM 0.5 MG PO TABS
0.5000 mg | ORAL_TABLET | Freq: Three times a day (TID) | ORAL | Status: DC
  Administered 2014-05-24 – 2014-05-25 (×4): 0.5 mg via ORAL
  Filled 2014-05-24 (×4): qty 1

## 2014-05-24 NOTE — Progress Notes (Signed)
[facility] MD Progress Note  05/24/2014 12:13 PM Shelby Hatfield  MRN:  [id number] Subjective:  " I still feel terrible" Objective: Patient reports ongoing symptoms of Withdrawal- these include nausea, muscular aches, and rhinorrhea. Her most significant symptom/complaint  Is a sense of " crawling on my legs" and inability to keep them still , particularly at night. These symptoms have been present only since she stopped opiates, and not before, so it is unlikely it represents restless leg syndrome, but more likely is part of her withdrawal syndrome. She has been on her current medication regimen in the past without side effects, so unlikely it is a side effect from these meds either. Denies vomiting.  She does appear better- better groomed, less labile, better eye contact, less intensely uncomfortable. Tolerating medications well at this time. No disruptive behaviors on unit. (+) cravings for opiates. I encouraged her to consider inpatient rehab as disposition if possible. She is ambivalent about this option at this time. Diagnosis:  Opiate Dependence, Opiate Withdrawal, Depression NOS  Total Time spent with patient: 25 minutes   ADL's:  improved  Sleep:fair  Appetite:  fair  Suicidal Ideation:  Denies  Homicidal Ideation:  denies AEB (as evidenced by):  Psychiatric Specialty Exam: Physical Exam  Review of Systems  Constitutional: Negative for fever and chills.  Respiratory: Negative for cough and shortness of breath.   Cardiovascular: Negative for chest pain.  Gastrointestinal: Positive for nausea.  Musculoskeletal: Positive for myalgias.  Psychiatric/Behavioral: Positive for substance abuse.    Blood pressure 106/47, pulse 60, temperature 98 F (36.7 C), temperature source Oral, resp. rate 18, height  (1.575 m), weight 106.142 kg (234 lb), last menstrual period 04/22/2014, SpO2 100.00%.Body mass index is 42.79 kg/(m^2).  General Appearance: improved grooming  Eye Contact::   Good  Speech:  Normal Rate  Volume:  Normal  Mood:  Depressed and but improved compared to admission  Affect:  Constricted and but reactive  Thought Process:  Goal Directed and Linear  Orientation:  Full (Time, Place, and Person)  Thought Content:  no hallucinations, no delusions  Suicidal Thoughts:  No- at this time denying any suicidal or homicidal ideations  Homicidal Thoughts:  No  Memory:  NA  Judgement:  Fair  Insight:  Fair  Psychomotor Activity:  Restlessness  Concentration:  Good  Recall:  Good  Fund of Knowledge:Good  Language: Good  Akathisia:  Negative  Handed:  Right  AIMS (if indicated):     Assets:  Communication Skills Desire for Improvement Resilience  Sleep:  Number of Hours: 5.25   Musculoskeletal: Strength & Muscle Tone: within normal limits Gait & Station: normal Patient leans: N/A  Current Medications: Current Facility-Administered Medications  Medication Dose Route Frequency Provider Last Rate Last Dose  . acetaminophen (TYLENOL) tablet 650 mg  650 mg Oral Q6H PRN Kerry Hough, PA-C   650 mg at 05/24/14 1007  . alum & mag hydroxide-simeth (MAALOX/MYLANTA) 200-200-20 MG/5ML suspension 30 mL  30 mL Oral Q4H PRN Kerry Hough, PA-C      . cloNIDine (CATAPRES) tablet 0.1 mg  0.1 mg Oral BH-qamhs Nehemiah Massed, MD       Followed by  . [START ON 05/26/2014] cloNIDine (CATAPRES) tablet 0.1 mg  0.1 mg Oral QAC breakfast Nehemiah Massed, MD      . dicyclomine (BENTYL) tablet 20 mg  20 mg Oral Q6H PRN Nehemiah Massed, MD   20 mg at 05/24/14 0800  . FLUoxetine (PROZAC) capsule 20 mg  20 mg Oral Daily Nehemiah Massed, MD   20 mg at 05/24/14 0753  . gabapentin (NEURONTIN) capsule 100 mg  100 mg Oral TID Nehemiah Massed, MD   100 mg at 05/24/14 1111  . hydrOXYzine (ATARAX/VISTARIL) tablet 25 mg  25 mg Oral Q6H PRN Nehemiah Massed, MD   25 mg at 05/24/14 0758  . lamoTRIgine (LAMICTAL) tablet 25 mg  25 mg Oral Daily Nehemiah Massed, MD   25 mg at 05/24/14 0753  .  loperamide (IMODIUM) capsule 2-4 mg  2-4 mg Oral PRN Nehemiah Massed, MD      . magnesium hydroxide (MILK OF MAGNESIA) suspension 30 mL  30 mL Oral Daily PRN Kerry Hough, PA-C   30 mL at 05/23/14 1609  . methocarbamol (ROBAXIN) tablet 500 mg  500 mg Oral Q8H PRN Nehemiah Massed, MD   500 mg at 05/24/14 1007  . naproxen (NAPROSYN) tablet 500 mg  500 mg Oral BID PRN Nehemiah Massed, MD   500 mg at 05/24/14 0501  . nicotine (NICODERM CQ - dosed in mg/24 hours) patch 21 mg  21 mg Transdermal Daily Nehemiah Massed, MD   21 mg at 05/24/14 0754  . ondansetron (ZOFRAN-ODT) disintegrating tablet 4 mg  4 mg Oral Q6H PRN Nehemiah Massed, MD   4 mg at 05/24/14 1007  . traZODone (DESYREL) tablet 50 mg  50 mg Oral QHS,MR X 1 Kerry Hough, PA-C   50 mg at 05/23/14 2133    Lab Results: No results found for this or any previous visit (from the past 48 hour(s)).  Physical Findings: AIMS: Facial and Oral Movements Muscles of Facial Expression: None, normal Lips and Perioral Area: None, normal Jaw: None, normal Tongue: None, normal,Extremity Movements Upper (arms, wrists, hands, fingers): None, normal Lower (legs, knees, ankles, toes): None, normal, Trunk Movements Neck, shoulders, hips: None, normal, Overall Severity Severity of abnormal movements (highest score from questions above): None, normal Incapacitation due to abnormal movements: None, normal Patient's awareness of abnormal movements (rate only patient's report): No Awareness, Dental Status Current problems with teeth and/or dentures?: No Does patient usually wear dentures?: No  CIWA:    COWS:  COWS Total Score: 7  Assessment- patient continues to present with significant opiate withdrawal symptoms, to include nausea, myalgias, rhinorrhea, a sense of skin " crawling". Vitals are stable, but still appears somewhat restless. Mood is improving and is tolerating  Her mediations, which she has been on before and remembers as helpful and well  tolerated.  Treatment Plan Summary: Daily contact with patient to assess and evaluate symptoms and progress in treatment Medication management See below  Plan: Continue inpatient treatment. Continue detoxification efforts/ treatment with clonidine and symptomatic treatment Continue current meds - Prozac , Neurontin, Lamictal. Will start Ativan 0.5 mgrs TID for a brief period of time ( will not continue BZD after discharge) to assist with significant anxiety and WDL symptoms.   Medical Decision Making Problem Points:  Established problem, stable/improving (1), Review of last therapy session (1) and Review of psycho-social stressors (1) Data Points:  Review of medication regiment & side effects (2) Review of new medications or change in dosage (2)  I certify that inpatient services furnished can reasonably be expected to improve the patient's condition.   Lashana Spang 05/24/2014, 12:13 PM
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
## 2014-05-24 NOTE — Clinical Social Work Note (Signed)
CSW attempted to provide SPE to patient's mother Delorise Jackson [phone], left message. Awaiting return call.  Samuella Bruin, MSW, [employer] Clinical Social Worker [facility] [phone]

## 2014-05-24 NOTE — ED Provider Notes (Signed)
Medical screening examination/treatment/procedure(s) were performed by non-physician practitioner and as supervising physician I was immediately available for consultation/collaboration.   EKG Interpretation None        Shenee Wignall M Finola Rosal, MD 05/24/14 0719 

## 2014-05-24 NOTE — Progress Notes (Addendum)
D:  Patient's self inventory sheet, patient did not sleep well last night, sleep medication was not helpful.  Poor appetite, low energy level, poor concentration.  Rated depression and hopeless 9, anxiety 10.  Continues to experience tremors, diarrhea, chilling, cravings, cramping, agitation, nausea, runny nose, irritability, sneezing.  Denied SI.  Physical problems, pain, headaches.  Worst pain #8, back, legs, arms, head.  Pain medication is not working.  Plans to go to group.  Will try to stay motivated.  Just need to talk to MD about meds.  Staff overall have been very caring, quick to respond to needs.  No discharge plans.  No problems with meds after discharge. A:  Medications administered per MD orders.  Emotional support and encouragement given patient. R:  Denied SI and HI.  Denied A/V hallucinations.  Safety maintained with 15 minute checks.  Patient stated she tells herself she is worthless, no good.  Continues to ask for medications.  1730  Patient continues to ask for medications, stated her medications are not working.   Continues to ask for restless leg syndrome, ativan given per MD orders.

## 2014-05-24 NOTE — Progress Notes (Signed)
The focus of this group is to educate the patient on the purpose and policies of crisis stabilization and provide a format to answer questions about their admission.  The group details unit policies and expectations of patients while admitted.  Patient attended 0900 nurse education orientation group this morning.  Patient actively participated, appropriate affect, alert, appropriate insight and engagement.  Today patient will work on 3 goals for discharge.  

## 2014-05-24 NOTE — B[facility] Group Notes (Signed)
B[facility] LCSW Group Therapy 05/24/2014  1:15 pm   Type of Therapy: Group Therapy Participation Level: Active  Participation Quality: Attentive, Sharing and Supportive  Affect: Depressed and Flat  Cognitive: Alert and Oriented  Insight: Developing/Improving and Engaged  Engagement in Therapy: Developing/Improving and Engaged  Modes of Intervention: Clarification, Confrontation, Discussion, Education, Exploration, Limit-setting, Orientation, Problem-solving, Rapport Building, Dance movement psychotherapist, Socialization and Support  Summary of Progress/Problems: The topic for group was balance in life. Today's group focused on defining balance in one's own words, identifying things that can knock one off balance, and exploring healthy ways to maintain balance in life. Group members were asked to provide an example of a time when they felt off balance, describe how they handled that situation,and process healthier ways to regain balance in the future. Group members were asked to share the most important tool for maintaining balance that they learned while at [facility] and how they plan to apply this method after discharge. Patient reports that she would like to improve her communication with her children. She would like to spend more time on herself and step back from raising her daughter's child. Patient also discussed the need to set boundaries with her mother. CSWs and other group members provided patient with emotional support and encouragement.    Samuella Bruin, MSW, [employer] Clinical Social Worker [facility] [phone]

## 2014-05-25 DIAGNOSIS — F19939 Other psychoactive substance use, unspecified with withdrawal, unspecified: Secondary | ICD-10-CM

## 2014-05-25 DIAGNOSIS — F329 Major depressive disorder, single episode, unspecified: Secondary | ICD-10-CM

## 2014-05-25 DIAGNOSIS — F3289 Other specified depressive episodes: Secondary | ICD-10-CM

## 2014-05-25 DIAGNOSIS — F1120 Opioid dependence, uncomplicated: Principal | ICD-10-CM

## 2014-05-25 LAB — HEMOGLOBIN A1C
Hgb A1c MFr Bld: 6.4 % — ABNORMAL HIGH (ref <5.7)
MEAN PLASMA GLUCOSE: 137 mg/dL — AB (ref <117)

## 2014-05-25 MED ORDER — LORAZEPAM 0.5 MG PO TABS
0.5000 mg | ORAL_TABLET | Freq: Two times a day (BID) | ORAL | Status: DC
  Administered 2014-05-26 – 2014-05-27 (×3): 0.5 mg via ORAL
  Filled 2014-05-25 (×3): qty 1

## 2014-05-25 MED ORDER — GABAPENTIN 100 MG PO CAPS
200.0000 mg | ORAL_CAPSULE | Freq: Three times a day (TID) | ORAL | Status: DC
  Administered 2014-05-25 – 2014-05-27 (×5): 200 mg via ORAL
  Filled 2014-05-25: qty 2
  Filled 2014-05-25 (×3): qty 84
  Filled 2014-05-25 (×2): qty 2
  Filled 2014-05-25: qty 84
  Filled 2014-05-25 (×2): qty 2
  Filled 2014-05-25: qty 84
  Filled 2014-05-25 (×3): qty 2
  Filled 2014-05-25: qty 84

## 2014-05-25 MED ORDER — IBUPROFEN 200 MG PO TABS
400.0000 mg | ORAL_TABLET | Freq: Four times a day (QID) | ORAL | Status: DC | PRN
  Administered 2014-05-25 – 2014-05-27 (×5): 400 mg via ORAL
  Filled 2014-05-25 (×5): qty 2

## 2014-05-25 NOTE — Progress Notes (Signed)
D: Pt is blunted in affect and anxious in mood. Pt reports the following withdrawal symptoms: restlessness, abdominal cramping, anxiety, and sneezing. Pt is currently denying any SI/HI/AVH. Pt did not attend group this evening.  A: Writer administered scheduled and prn medications to pt. Continued support and availability as needed was extended to this pt. Staff continue to monitor pt with q52min checks.  R: No adverse drug reactions noted. Pt receptive to treatment. Pt remains safe at this time.

## 2014-05-25 NOTE — BHH Group Notes (Signed)
   [facility] LCSW Aftercare Discharge Planning Group Note  05/25/2014  8:45 AM   Participation Quality: Alert, Appropriate and Oriented  Mood/Affect: Depressed and Flat  Depression Rating: 3  Anxiety Rating: 5  Thoughts of Suicide: Pt denies SI/HI  Will you contract for safety? Yes  Current AVH: Pt denies  Plan for Discharge/Comments: Pt attended discharge planning group and actively participated in group. CSW provided pt with today's workbook. Patient reports that she is feeling less depressed and anxious this morning compared to yesterday due to her medications and sleeping longer last night. Patient reports that she was restless in her sleep last night and experienced nightmares. Patient denies current SI/HI. Patient is considering residential treatment at this time but wants to discuss it with her family first before making a decision. Also agreeable to follow up with [facility] for outpatient therapy and medication management.  Transportation Means: Pt reports access to transportation  Supports: No supports mentioned at this time  Samuella Bruin, MSW, [employer] Clinical Social Worker [employer] [phone]

## 2014-05-25 NOTE — Progress Notes (Signed)
[facility] Adult Case Management Discharge Plan :  Will you be returning to the same living situation after discharge: Yes,  patient will return home with her family At discharge, do you have transportation home?:Yes,  patient reports that she will have transportation home Do you have the ability to pay for your medications:Yes,  patient will be provided with necessary medication samples and prescriptions at discharge.   Release of information consent forms completed and in the chart;  Patient's signature needed at discharge.  Patient to Follow up at: Follow-up Information   Follow up with [facility] On 05/31/2014. (Please present to [facility] Monday-Friday between 8 am- 2 pm for walk-in assessment for therapy and medication management. Please call office if you need to reschedule appointment. )    Contact information:   Address: [address] [place], [place] [address] [phone]      Follow up with [facility] . (Please follow up with [facility] residential treatment services regarding status of referral sent on 05/25/14.)    Contact information:   [address] [place], [place] [address] [phone]      Patient denies SI/HI:   Yes,  denies    Safety Planning and Suicide Prevention discussed:  Yes,  with patient  Gianpaolo Mindel, [place] 05/25/2014, 5:12 PM

## 2014-05-25 NOTE — Tx Team (Signed)
Interdisciplinary Treatment Plan Update (Adult) Date: 05/25/2014   Time Reviewed: 9:30 AM  Progress in Treatment: Attending groups: Yes Participating in groups: Yes Taking medication as prescribed: Yes Tolerating medication: Yes Family/Significant other contact made: CSW continuing to assess. Patient understands diagnosis: Yes Discussing patient identified problems/goals with staff: Yes Medical problems stabilized or resolved: Yes Denies suicidal/homicidal ideation: Yes Issues/concerns per patient self-inventory: Yes Other:  New problem(s) identified: N/A  Discharge Plan or Barriers: CSW continuing to assess. Patient considering inpatient treatment at this time.  Reason for Continuation of Hospitalization:  Depression Anxiety Medication Stabilization   Comments: N/A  Estimated length of stay: 3-5 days  For review of initial/current patient goals, please see plan of care.  Attendees: Patient:    Family:    Physician: Dr. Jama Flavors 05/25/2014 9:30 AM  Nursing: Corrie Dandy, RN 05/25/2014 9:30 AM  Clinical Social Worker: Samuella Bruin,  LCSWA 05/25/2014 9:30 AM  Other: Chad Cordial, LCSWA 05/25/2014 9:30 AM  Other: Leisa Lenz, Vesta [employer]er Liaison 05/25/2014 9:30 AM  Other: Onnie Boer, Case Manager 05/25/2014 9:30 AM  Other: Burnetta Sabin, RN 05/25/2014 9:30 AM  Other: Harrell Lark., RN 05/25/2014 9:30 AM  Other:    Other:    Other:    Other:      Scribe for Treatment Team:  Samuella Bruin, MSW, [employer] [phone]

## 2014-05-25 NOTE — Progress Notes (Addendum)
Patient ID: Shelby Hatfield, female   DOB: 07-13-1976, 38 y.o.   MRN: [id number] [facility] MD Progress Note  05/25/2014 1:11 PM Shelby Hatfield  MRN:  [id number] Subjective:   " I am starting to feel better."  Objective:   I have discussed case with treatment team and met with patient. Patient continues to have some withdrawal symptoms, with some residual cramping and aches. Restless legs do persist. She appears much improved compared to admission ,and presents calmer, better related, with fuller range of affect, and less anxiety/restlessness.  States she met with her adult children yesterday, and she told them about her opiate/heroin abuse. She had been feeling guilty about them not knowing and states there is a sense of relief that she told them. Feels a sense of relief she was able to share this. At this time patient is tolerating medications well and denies side effects. Patient has met with SW and at this time her plan is to go back home after discharge, follow up at [facility] for outpatient psychiatric treatment, and contact residential rehab setting until a bed becomes available. No disruptive behaviors on unit  Diagnosis:  Opiate Dependence, Opiate Withdrawal, Depression NOS  Total Time spent with patient: 25 minutes   ADL's:  improved  Sleep: slept better last night  Appetite:  fair  Suicidal Ideation:  Denies  Homicidal Ideation:  denies AEB (as evidenced by):  Psychiatric Specialty Exam: Physical Exam  Review of Systems  Constitutional: Negative for fever and chills.  Respiratory: Negative for cough and shortness of breath.   Cardiovascular: Negative for chest pain.  Gastrointestinal: Positive for nausea.  Musculoskeletal: Positive for myalgias.  Psychiatric/Behavioral: Positive for substance abuse.    Blood pressure 102/67, pulse 62, temperature 98.2 F (36.8 C), temperature source Oral, resp. rate 24, height $RemoveBe'5\' 2"'auNoQigCx$  (1.575 m), weight 106.142 kg (234 lb), last menstrual period  04/22/2014, SpO2 100.00%.Body mass index is 42.79 kg/(m^2).  General Appearance: improved grooming  Eye Contact::  Good  Speech:  Normal Rate  Volume:  Normal  Mood:  mood gradually improving , still depressed, but better than upon admission  Affect:  less constricted affect  Thought Process:  Goal Directed and Linear  Orientation:  Full (Time, Place, and Person)  Thought Content:  no hallucinations, no delusions  Suicidal Thoughts:  No- at this time denying any suicidal or homicidal ideations  Homicidal Thoughts:  No  Memory:  NA  Judgement:  Fair  Insight:  Fair  Psychomotor Activity:  Normal  Concentration:  Good  Recall:  Good  Fund of Knowledge:Good  Language: Good  Akathisia:  Negative  Handed:  Right  AIMS (if indicated):     Assets:  Communication Skills Desire for Improvement Resilience  Sleep:  Number of Hours: 6   Musculoskeletal: Strength & Muscle Tone: within normal limits Gait & Station: normal Patient leans: N/A  Current Medications: Current Facility-Administered Medications  Medication Dose Route Frequency Provider Last Rate Last Dose  . acetaminophen (TYLENOL) tablet 650 mg  650 mg Oral Q6H PRN Laverle Hobby, PA-C   650 mg at 05/25/14 0354  . alum & mag hydroxide-simeth (MAALOX/MYLANTA) 200-200-20 MG/5ML suspension 30 mL  30 mL Oral Q4H PRN Laverle Hobby, PA-C      . cloNIDine (CATAPRES) tablet 0.1 mg  0.1 mg Oral BH-qamhs Neita Garnet, MD   0.1 mg at 05/25/14 0802   Followed by  . [START ON 05/26/2014] cloNIDine (CATAPRES) tablet 0.1 mg  0.1 mg Oral QAC  breakfast Neita Garnet, MD      . dicyclomine (BENTYL) tablet 20 mg  20 mg Oral Q6H PRN Neita Garnet, MD   20 mg at 05/25/14 [phone]  . FLUoxetine (PROZAC) capsule 20 mg  20 mg Oral Daily Neita Garnet, MD   20 mg at 05/25/14 0802  . gabapentin (NEURONTIN) capsule 100 mg  100 mg Oral TID Neita Garnet, MD   100 mg at 05/25/14 1202  . hydrOXYzine (ATARAX/VISTARIL) tablet 25 mg  25 mg Oral Q6H PRN  Neita Garnet, MD   25 mg at 05/25/14 [phone]  . lamoTRIgine (LAMICTAL) tablet 25 mg  25 mg Oral Daily Neita Garnet, MD   25 mg at 05/25/14 0802  . loperamide (IMODIUM) capsule 2-4 mg  2-4 mg Oral PRN Neita Garnet, MD      . LORazepam (ATIVAN) tablet 0.5 mg  0.5 mg Oral TID Neita Garnet, MD   0.5 mg at 05/25/14 1202  . magnesium hydroxide (MILK OF MAGNESIA) suspension 30 mL  30 mL Oral Daily PRN Laverle Hobby, PA-C   30 mL at 05/25/14 0616  . methocarbamol (ROBAXIN) tablet 500 mg  500 mg Oral Q8H PRN Neita Garnet, MD   500 mg at 05/25/14 6301  . naproxen (NAPROSYN) tablet 500 mg  500 mg Oral BID PRN Neita Garnet, MD   500 mg at 05/25/14 1118  . nicotine (NICODERM CQ - dosed in mg/24 hours) patch 21 mg  21 mg Transdermal Daily Neita Garnet, MD   21 mg at 05/25/14 0803  . ondansetron (ZOFRAN-ODT) disintegrating tablet 4 mg  4 mg Oral Q6H PRN Neita Garnet, MD   4 mg at 05/24/14 1007  . traZODone (DESYREL) tablet 50 mg  50 mg Oral QHS,MR X 1 Laverle Hobby, PA-C   50 mg at 05/24/14 2243    Lab Results:  Results for orders placed during the hospital encounter of 05/22/14 (from the past 48 hour(s))  HEMOGLOBIN A1C     Status: Abnormal   Collection Time    05/25/14  6:09 AM      Result Value Ref Range   Hemoglobin A1C 6.4 (*) <5.7 %   Comment: (NOTE)                                                                               According to the ADA Clinical Practice Recommendations for 2011, when     HbA1c is used as a screening test:      >=6.5%   Diagnostic of Diabetes Mellitus               (if abnormal result is confirmed)     5.7-6.4%   Increased risk of developing Diabetes Mellitus     References:Diagnosis and Classification of Diabetes Mellitus,Diabetes     SWFU,9323,55(DDUKG 1):S62-S69 and Standards of Medical Care in             Diabetes - 2011,Diabetes Care,2011,34 (Suppl 1):S11-S61.   Mean Plasma Glucose 137 (*) <117 mg/dL   Comment: Performed at [facility]     Physical Findings: AIMS: Facial and Oral Movements Muscles of Facial Expression: None, normal Lips and Perioral Area: None, normal Jaw: None, normal  Tongue: None, normal,Extremity Movements Upper (arms, wrists, hands, fingers): None, normal Lower (legs, knees, ankles, toes): None, normal, Trunk Movements Neck, shoulders, hips: None, normal, Overall Severity Severity of abnormal movements (highest score from questions above): None, normal Incapacitation due to abnormal movements: None, normal Patient's awareness of abnormal movements (rate only patient's report): No Awareness, Dental Status Current problems with teeth and/or dentures?: No Does patient usually wear dentures?: No  CIWA:  CIWA-Ar Total: 3 COWS:  COWS Total Score: 3  Assessment-  At this time patient is improved compared to admission, although she does continue to have some symptoms of withdrawal ( myalgias, rhinorrhea). Her mood is less depressed, although not normalized. She is tolerating medications well.  Wants to go home after discharge, but does plan to try to get into an inpatient rehab program in the near future.  Treatment Plan Summary: Daily contact with patient to assess and evaluate symptoms and progress in treatment Medication management See below  Plan: Continue inpatient treatment. CONSIDER DISCHARGE ON Sunday ( HOME, with follow up at [facility] and recommendation of going to an inpatient rehab and to NA on a daily basis. D/C Ativan prior to discharge- it is being used short term to help with withdrawal related anxiety)  Continue detoxification treatment Continue current meds - Prozac , Neurontin, Lamictal. Increase Neurontin to 200 mgrs TID. Ativan 0.5 mgrs TID  We discussed elevated Hgb A1C, importance of weight loss, dietary changes, increased physical activity and following up with her PCP. Medical Decision Making Problem Points:  Established problem, stable/improving (1), Review of last therapy  session (1) and Review of psycho-social stressors (1) Data Points:  Review of medication regiment & side effects (2) Review of new medications or change in dosage (2)  I certify that inpatient services furnished can reasonably be expected to improve the patient's condition.   Sinahi Knights, [place] 05/25/2014, 1:11 PM

## 2014-05-25 NOTE — [facility] Suicide Risk Assessment (Signed)
[facility] INPATIENT:  Family/Significant Other Suicide Prevention Education  Suicide Prevention Education:  Contact Attempts: Mother Delorise Jackson [phone] (name of family member/significant other) has been identified by the patient as the family member/significant other with whom the patient will be residing, and identified as the person(s) who will aid the patient in the event of a mental health crisis.  With written consent from the patient, two attempts were made to provide suicide prevention education, prior to and/or following the patient's discharge.  We were unsuccessful in providing suicide prevention education.  A suicide education pamphlet was given to the patient to share with family/significant other.  Date and time of first attempt: 05/24/14 at 12:25 pm Date and time of second attempt: 05/25/14  At 12:35 pm  Shelby Hatfield, [place] 05/25/2014, 12:36 PM

## 2014-05-25 NOTE — Progress Notes (Signed)
Writer spoke with patient at medication window 1:1 before she attended group. Patient requested medication for anxiety and reported that she was informed that she may possibly discharge on Satyrday/Sunday and she is not sure that she is ready. Writer inquired as to what made her feel that she was not ready and she reports that she still feels anxious but as far as her detox she reports that she even feels and looks better since coming in. Patient reports that she has a referral to contact a 14 day treatment facility and she plans on following through with this d/t to her grand-daughter and she wants to be clean for this reason. Writer encouraged her to also do this for her self as well and she was receptive. She denies si/hi/a/v hallucinations. Support and encouragement given, safety maintained on unit wit 15 min checks.

## 2014-05-25 NOTE — Clinical Social Work Note (Signed)
CSW attempted to provide SPE to patient's mother Delorise Jackson [phone], no answer. CSW left voicemail, awaiting return call.  Samuella Bruin, MSW, [facility] Clinical Social Worker [facility] [phone]'

## 2014-05-25 NOTE — Progress Notes (Signed)
Shelby Hatfield is handling her withdrawal moderately well. She is up in the milieu...dressed and interacting this morning. She takes her scheduled meds as ordered and requests robaxin at 0822 and naproxen at 1118. She rates her body aches as " 8 out of 10".   A She completes her morning assessment and on it she writes  She denies SI within the past 24 hrs she rates her depression, hopelessness and anxiety "01/30/09". She says " I'm going to stay clean.ibuprofen'm going to kick this ".    R Safety is in place and poc cont.,

## 2014-05-25 NOTE — [facility] Group Notes (Signed)
[facility] LCSW Group Therapy 05/25/2014  1:15 PM   Type of Therapy: Group Therapy  Participation Level: Did Not Attend.   Darielys Giglia, MSW, LCSWA Clinical Social Worker [facility] [phone]   

## 2014-05-26 NOTE — Progress Notes (Addendum)
D Renesha is seen out in the  Milieu today.She  admits that she feels very anxious and worried  Today, anticipating her DC from [facility] tomorrow. She completes her daily assessment and on it she writes she denies SI within the past 24 hrs, and she designates "3/`1/6" as her degrees of depression, hopelessness and anxiety.   A She has requested ibuprofen,, robaxin, bently, vistaril and zofran prn.    R  Safety is in place and poc includes preparing for pt DC home tomorrow. Marland Kitchen

## 2014-05-26 NOTE — Plan of Care (Signed)
Problem: Ineffective individual coping Goal: STG: Patient will remain free from self harm Outcome: Progressing Patient remained free from self harm this shift as evidenced by 15 min checks.  Problem: Alteration in mood Goal: LTG-Patient reports reduction in suicidal thoughts (Patient reports reduction in suicidal thoughts and is able to verbalize a safety plan for whenever patient is feeling suicidal)  Outcome: Progressing Patient currently denies si  Problem: Alteration in mood & ability to function due to Goal: STG-Patient will attend groups Patient attended AA/NA group this evening  Outcome: Progressing Patient attended  AA/NA group this evening.

## 2014-05-26 NOTE — Progress Notes (Signed)
Patient ID: Shelby Hatfield, female   DOB: 1976-07-29, 38 y.o.   MRN: [id number] [facility] MD Progress Note  05/26/2014 11:27 AM Shelby Hatfield  MRN:  [id number] Subjective:   " I woke up anxious this morning"   Objective:   I have reviewed the chart and met with patient.  Patient continues to have some withdrawal symptoms, with some residual cramping and aches in stomach and sneezing. Restless legs do persist. No opiate/herion cravings today. She has been eating a lot of ice cream. Pt is concerned about her sister convincing pt to use again. States sister is her best friend and they spend a lot of time together. Pt is going to change her phone number and find support outside of her family.   Overall she has a better attitude and irritability is decreased. Depression has decreased to 2/10. Anxiety is increased. Energy is low today. Sleep was great. Appetite is decreased. Concentration is poor and she is unable to focus on one task.   At this time patient is tolerating medications well and denies side effects.  Patient has met with SW and at this time her plan is to go back home after discharge, follow up at [facility] for outpatient psychiatric treatment, and contact residential rehab setting until a bed becomes available. Her preference is [facility].   No disruptive behaviors on unit  Diagnosis:  Opiate Dependence, Opiate Withdrawal, Depression NOS  Total Time spent with patient: 25 minutes   ADL's:  improved  Sleep: slept great last night  Appetite:  decreased  Suicidal Ideation:  Denies  Homicidal Ideation:  denies AEB (as evidenced by):  Psychiatric Specialty Exam: Physical Exam  Review of Systems  Constitutional: Negative for fever and chills.  Respiratory: Negative for cough and shortness of breath.   Cardiovascular: Negative for chest pain.  Gastrointestinal: Positive for nausea.  Musculoskeletal: Positive for myalgias.  Psychiatric/Behavioral: Positive for substance abuse.     Blood pressure 106/75, pulse 86, temperature 98.2 F (36.8 C), temperature source Oral, resp. rate 18, height _0  (1.575 m), weight 106.142 kg (234 lb), last menstrual period 04/22/2014, SpO2 100.00%.Body mass index is 42.79 kg/(m^2).  General Appearance: improved grooming  Eye Contact::  Good  Speech:  Normal Rate  Volume:  Normal  Mood:  mood gradually improving , still mildly depressed, but better than upon admission  Affect:  less constricted affect  Thought Process:  Goal Directed and Linear  Orientation:  Full (Time, Place, and Person)  Thought Content:  no hallucinations, no delusions  Suicidal Thoughts:  No- at this time denying any suicidal or homicidal ideations  Homicidal Thoughts:  No  Memory:  NA  Judgement:  Fair  Insight:  Fair  Psychomotor Activity:  Normal  Concentration:  Good  Recall:  Good  Fund of Knowledge:Good  Language: Good  Akathisia:  Negative  Handed:  Right  AIMS (if indicated):     Assets:  Communication Skills Desire for Improvement Resilience  Sleep:  Number of Hours: 6.5   Musculoskeletal: Strength & Muscle Tone: within normal limits Gait & Station: normal Patient leans: N/A  Current Medications: Current Facility-Administered Medications  Medication Dose Route Frequency Provider Last Rate Last Dose  . alum & mag hydroxide-simeth (MAALOX/MYLANTA) 200-200-20 MG/5ML suspension 30 mL  30 mL Oral Q4H PRN Laverle Hobby, PA-C      . cloNIDine (CATAPRES) tablet 0.1 mg  0.1 mg Oral QAC breakfast Neita Garnet, MD   0.1 mg at 05/26/14 0830  .  dicyclomine (BENTYL) tablet 20 mg  20 mg Oral Q6H PRN Neita Garnet, MD   20 mg at 05/26/14 0841  . FLUoxetine (PROZAC) capsule 20 mg  20 mg Oral Daily Neita Garnet, MD   20 mg at 05/26/14 0830  . gabapentin (NEURONTIN) capsule 200 mg  200 mg Oral TID Neita Garnet, MD   200 mg at 05/26/14 0830  . hydrOXYzine (ATARAX/VISTARIL) tablet 25 mg  25 mg Oral Q6H PRN Neita Garnet, MD   25 mg at 05/26/14  0659  . ibuprofen (ADVIL,MOTRIN) tablet 400 mg  400 mg Oral Q6H PRN Neita Garnet, MD   400 mg at 05/26/14 5916  . lamoTRIgine (LAMICTAL) tablet 25 mg  25 mg Oral Daily Neita Garnet, MD   25 mg at 05/26/14 0830  . loperamide (IMODIUM) capsule 2-4 mg  2-4 mg Oral PRN Neita Garnet, MD      . LORazepam (ATIVAN) tablet 0.5 mg  0.5 mg Oral BID Neita Garnet, MD   0.5 mg at 05/26/14 3846  . magnesium hydroxide (MILK OF MAGNESIA) suspension 30 mL  30 mL Oral Daily PRN Laverle Hobby, PA-C   30 mL at 05/25/14 0616  . methocarbamol (ROBAXIN) tablet 500 mg  500 mg Oral Q8H PRN Neita Garnet, MD   500 mg at 05/26/14 0841  . nicotine (NICODERM CQ - dosed in mg/24 hours) patch 21 mg  21 mg Transdermal Daily Neita Garnet, MD   21 mg at 05/26/14 6599  . ondansetron (ZOFRAN-ODT) disintegrating tablet 4 mg  4 mg Oral Q6H PRN Neita Garnet, MD   4 mg at 05/26/14 1006  . traZODone (DESYREL) tablet 50 mg  50 mg Oral QHS,MR X 1 Laverle Hobby, PA-C   50 mg at 05/25/14 2204    Lab Results:  Results for orders placed during the hospital encounter of 05/22/14 (from the past 48 hour(s))  HEMOGLOBIN A1C     Status: Abnormal   Collection Time    05/25/14  6:09 AM      Result Value Ref Range   Hemoglobin A1C 6.4 (*) <5.7 %   Comment: (NOTE)                                                                               According to the ADA Clinical Practice Recommendations for 2011, when     HbA1c is used as a screening test:      >=6.5%   Diagnostic of Diabetes Mellitus               (if abnormal result is confirmed)     5.7-6.4%   Increased risk of developing Diabetes Mellitus     References:Diagnosis and Classification of Diabetes Mellitus,Diabetes     JTTS,1779,39(QZESP 1):S62-S69 and Standards of Medical Care in             Diabetes - 2011,Diabetes Care,2011,34 (Suppl 1):S11-S61.   Mean Plasma Glucose 137 (*) <117 mg/dL   Comment: Performed at [facility]    Physical Findings: AIMS:  Facial and Oral Movements Muscles of Facial Expression: None, normal Lips and Perioral Area: None, normal Jaw: None, normal Tongue: None, normal,Extremity Movements Upper (arms, wrists, hands, fingers): None,  normal Lower (legs, knees, ankles, toes): None, normal, Trunk Movements Neck, shoulders, hips: None, normal, Overall Severity Severity of abnormal movements (highest score from questions above): None, normal Incapacitation due to abnormal movements: None, normal Patient's awareness of abnormal movements (rate only patient's report): No Awareness, Dental Status Current problems with teeth and/or dentures?: No Does patient usually wear dentures?: No  CIWA:  CIWA-Ar Total: 3 COWS:  COWS Total Score: 1  Assessment-  At this time patient is improved compared to admission, although she does continue to have some symptoms of withdrawal ( myalgias,sneezing). Her mood is less depressed, although not normalized. She is tolerating medications well.  Wants to go home after discharge, but does plan to try to get into an inpatient rehab program in the near future.  Treatment Plan Summary: Daily contact with patient to assess and evaluate symptoms and progress in treatment Medication management See below  Plan: Continue inpatient treatment. Plan DISCHARGE ON Sunday (HOME, with follow up at [facility] and recommendation of going to an inpatient rehab and to NA on a daily basis. D/C Ativan prior to discharge- it is being used short term to help with withdrawal related anxiety) Ativan 0.5 mgrs BID  Continue detoxification treatment Continue current meds - Prozac 71m po qD, Neurontin 2071mTID, Lamictal 2544mD      Medical Decision Making Problem Points:  Established problem, stable/improving (1), Review of last therapy session (1) and Review of psycho-social stressors (1) Data Points:  Review of medication regiment & side effects (2) Review of new medications or change in dosage (2)  I  certify that inpatient services furnished can reasonably be expected to improve the patient's condition.   AGACharlcie Cradle29/2015, 11:27 AM

## 2014-05-26 NOTE — [facility] Group Notes (Signed)
[facility] Group Notes:  (Clinical Social Work)  05/26/2014   1:15-2:15PM  Summary of Progress/Problems:   The main focus of today's process group was for the patient to identify ways in which they have sabotaged their own mental health wellness/recovery.  Motivational interviewing and a handout were used to explore the benefits and costs of their self-sabotaging behavior as well as the benefits and costs of changing this behavior.  The Stages of Change were explained to the group using a handout, and patients identified where they are with regard to changing self-defeating behaviors.  The patient expressed herself throughout group and showed insight in working through the Decision Balance exercise.  Type of Therapy:  Process Group  Participation Level:  Active  Participation Quality:  Attentive and Sharing  Affect:  Defensive and Flat  Cognitive:  Oriented  Insight:  Engaged  Engagement in Therapy:  Engaged  Modes of Intervention:  Education, Motivational Interviewing   Ambrose Mantle, LCSW 05/26/2014, 4:00pm

## 2014-05-26 NOTE — Progress Notes (Signed)
[facility] Group Notes:  (Nursing/MHT/Case Management/Adjunct)  Date:  05/26/2014  Time:  9:41 PM  Type of Therapy:  Psychoeducational Skills  Participation Level:  Minimal  Participation Quality:  Monopolizing  Affect:  Excited  Cognitive:  Disorganized  Insight:  Limited  Engagement in Group:  Monopolizing and Off Topic  Modes of Intervention:  Education  Summary of Progress/Problems: The  Patient arrived late for group this evening and interrupted her peers. The patient stated that she was very upbeat upon hearing that her friend and another family of hers have agreed to go in for drug treatment. The patient did  Not state her relapse prevention technique.   Hazle Coca S 05/26/2014, 9:41 PM

## 2014-05-27 MED ORDER — LAMOTRIGINE 25 MG PO TABS: 25.0000 mg | ORAL_TABLET | Freq: Every day | ORAL | Status: DC

## 2014-05-27 MED ORDER — GABAPENTIN 100 MG PO CAPS: 200.0000 mg | ORAL_CAPSULE | Freq: Three times a day (TID) | ORAL | Status: DC

## 2014-05-27 MED ORDER — HYDROXYZINE HCL 25 MG PO TABS: 25.0000 mg | ORAL_TABLET | Freq: Four times a day (QID) | ORAL | Status: DC | PRN

## 2014-05-27 MED ORDER — TRAZODONE HCL 50 MG PO TABS: 50.0000 mg | ORAL_TABLET | Freq: Every evening | ORAL | Status: DC | PRN

## 2014-05-27 MED ORDER — FLUOXETINE HCL 20 MG PO CAPS: 20.0000 mg | ORAL_CAPSULE | Freq: Every day | ORAL | Status: DC

## 2014-05-27 MED ORDER — TIOTROPIUM BROMIDE MONOHYDRATE 18 MCG IN CAPS: 18.0000 ug | ORAL_CAPSULE | Freq: Every day | RESPIRATORY_TRACT | Status: DC
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
## 2014-05-27 NOTE — Discharge Summary (Signed)
Physician Discharge Summary Note  Patient:  Shelby Hatfield is an 38 y.o., female MRN:  [id number] DOB:  [birth year] Patient phone:  [phone] (home)  Patient address:   [address] 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 Her speech is normal and behavior is normal. Judgment and thought content normal. Cognition and memory are normal.    Review of Systems  Constitutional: Negative.   HENT: Negative.   Eyes: Negative.   Respiratory: Negative.   Cardiovascular: Negative.   Gastrointestinal: Negative.   Genitourinary: Negative.   Musculoskeletal: Negative.   Skin: Negative.   Neurological: Negative.   Endo/Heme/Allergies: Negative.   Psychiatric/Behavioral: Negative.     Blood pressure 116/69, pulse 56, temperature 98.6 F (37 C), temperature source Oral, resp. rate 18, height  (1.575 m), weight 106.142 kg (234 lb), last menstrual period 04/22/2014, SpO2 100.00%.Body mass index is 42.79 kg/(m^2).  See Physician SRA                                                  Past Psychiatric History: See H&P Diagnosis:  Hospitalizations:  Outpatient Care:  Substance Abuse Care:  Self-Mutilation:  Suicidal Attempts:  Violent Behaviors:   Musculoskeletal: Strength & Muscle Tone: within normal limits Gait & Station: normal Patient leans: N/A  DSM5: AXIS I: Opiate Dependence, Opiate withdrawal, Depression d/o NOS  AXIS II: Deferred  AXIS III:  Past Medical History   Diagnosis  Date   .  Bipolar 1 disorder    .  Migraines    .  Wears dentures      upper denture   .  Adenotonsillar hypertrophy  03/2012     snores during  sleep; denies apnea; states occ. wakes up coughing   .  COPD (chronic obstructive pulmonary disease)    .  Anemia    .  Depression    .  Anxiety    .  Obesity    .  Hypercholesterolemia    .  Pneumonia    .  Pancreatitis     AXIS IV: economic problems, other psychosocial or environmental problems, problems with access to health care services and problems with primary support group  AXIS V: 61-70 mild symptoms  Level of Care:  OP  Hospital Course: Shelby Hatfield 38 year old Woman, with a history of opiate dependence, who was using heroin and oxycodone daily . In the context of her drug use there has been increased family tension, and her adult son and daughter threatened to not see her again and keep her from seeing grandchildren. She was also feeling depressed, with frequent mood swings, and recently had developed suicidal ideations of walking into traffic. This motivated patient to seek treatment. Patient came to ER on 8/25- states she last used two days prior. She presented with quite significant opiate withdrawal symptoms and had an initial COWS score of 17. Patient states that she had been doing " all right" on psychiatric medications she had been prescribed, which included Lamictal, Prozac, Neurontin, Trazodone, Zyprexa. She  states she stopped these medications about four months ago , after which she had increased depression and mood lability/swings.         Shelby Hatfield was admitted to the adult 500 unit where she was evaluated and her symptoms were identified. Medication management was discussed and implemented. The patient was not taking any psychiatric medications prior to admission. Her UDS was positive for opiates/marijuana. The patient was given Clonidine for opiate detox. She was started on Neurontin 200 mg TID for anxiety/improved mood stability, Prozac 20 mg daily for depression and Lamictal 25 mg daily for mood control. The patient received prn medications to control symptoms of  opiate withdrawal. These included motrin, robaxin, bentyl, vistaril and zofran. She was encouraged to participate in unit programming. Medical problems were identified and treated appropriately. Home medication was restarted as needed.  She was evaluated each day by a clinical provider to ascertain the patient's response to treatment.  Improvement was noted by the patient's report of decreasing symptoms, improved sleep and appetite, affect, medication tolerance, behavior, and participation in unit programming.  The patient was asked each day to complete a self inventory noting mood, mental status, pain, new symptoms, anxiety and concerns.         She responded well to medication and being in a therapeutic and supportive environment. Positive and appropriate behavior was noted and the patient was motivated for recovery.  She worked closely with the treatment team and case manager to develop a discharge plan with appropriate goals. Coping skills, problem solving as well as relaxation therapies were also part of the unit programming.         By the day of discharge she was in much improved condition than upon admission.  Symptoms were reported as significantly decreased or resolved completely.  The patient denied SI/HI and voiced no AVH. She was motivated to continue taking medication with a goal of continued improvement in mental health. Shelby Hatfield was discharged home with a plan to follow up as noted below.  Consults:  None  Significant Diagnostic Studies:  Chemistry panel, CBC, Hemoglobin A1C, UDS positive for opiates/marijuana   Discharge Vitals:   Blood pressure 116/69, pulse 56, temperature 98.6 F (37 C), temperature source Oral, resp. rate 18, height  (1.575 m), weight 106.142 kg (234 lb), last menstrual period 04/22/2014, SpO2 100.00%. Body mass index is 42.79 kg/(m^2). Lab Results:   Results for orders placed during the hospital encounter of 05/22/14 (from the past 72 hour(s))   HEMOGLOBIN A1C     Status: Abnormal   Collection Time    05/25/14  6:09 AM      Result Value Ref Range   Hemoglobin A1C 6.4 (*) <5.7 %   Comment: (NOTE)                                                                               According to the ADA Clinical Practice Recommendations for 2011, when     HbA1c is used as a screening test:      >=6.5%   Diagnostic of Diabetes Mellitus               (if abnormal result is  confirmed)     5.7-6.4%   Increased risk of developing Diabetes Mellitus     References:Diagnosis and Classification of Diabetes Mellitus,Diabetes     Care,2011,34(Suppl 1):S62-S69 and Standards of Medical Care in             Diabetes - 2011,Diabetes Care,2011,34 (Suppl 1):S11-S61.   Mean Plasma Glucose 137 (*) <117 mg/dL   Comment: Performed at [facility]    Physical Findings: AIMS: Facial and Oral Movements Muscles of Facial Expression: None, normal Lips and Perioral Area: None, normal Jaw: None, normal Tongue: None, normal,Extremity Movements Upper (arms, wrists, hands, fingers): None, normal Lower (legs, knees, ankles, toes): None, normal, Trunk Movements Neck, shoulders, hips: None, normal, Overall Severity Severity of abnormal movements (highest score from questions above): None, normal Incapacitation due to abnormal movements: None, normal Patient's awareness of abnormal movements (rate only patient's report): No Awareness, Dental Status Current problems with teeth and/or dentures?: No Does patient usually wear dentures?: No  CIWA:  CIWA-Ar Total: 3 COWS:  COWS Total Score: 0  Psychiatric Specialty Exam: See Psychiatric Specialty Exam and Suicide Risk Assessment completed by Attending Physician prior to discharge.  Discharge destination:  Home  Is patient on multiple antipsychotic therapies at discharge:  No   Has Patient had three or more failed trials of antipsychotic monotherapy by history:  No  Recommended Plan for Multiple  Antipsychotic Therapies: NA     Medication List       Indication   albuterol 108 (90 BASE) MCG/ACT inhaler  Commonly known as:  PROVENTIL HFA;VENTOLIN HFA  Inhale 2 puffs into the lungs 3 (three) times daily as needed for wheezing or shortness of breath.      FLUoxetine 20 MG capsule  Commonly known as:  PROZAC  Take 1 capsule (20 mg total) by mouth daily.   Indication:  Depressive Phase of Manic-Depression     gabapentin 100 MG capsule  Commonly known as:  NEURONTIN  Take 2 capsules (200 mg total) by mouth 3 (three) times daily.   Indication:  lower back pain     hydrOXYzine 25 MG tablet  Commonly known as:  ATARAX/VISTARIL  Take 1 tablet (25 mg total) by mouth every 6 (six) hours as needed for anxiety.      lamoTRIgine 25 MG tablet  Commonly known as:  LAMICTAL  Take 1 tablet (25 mg total) by mouth daily.   Indication:  Manic-Depression     tiotropium 18 MCG inhalation capsule  Commonly known as:  SPIRIVA  Place 1 capsule (18 mcg total) into inhaler and inhale daily.   Indication:  Chronic Obstructive Lung Disease     traZODone 50 MG tablet  Commonly known as:  DESYREL  Take 1 tablet (50 mg total) by mouth at bedtime and may repeat dose one time if needed.   Indication:  Trouble Sleeping           Follow-up Information   Follow up with [facility] On 05/31/2014. (Please present to [facility] Monday-Friday between 8 am- 2 pm for walk-in assessment for therapy and medication management. Please call office if you need to reschedule appointment. )    Contact information:   Address: [address], [place], [place] [address] [phone]      Follow up with [facility] . (Please follow up with [facility] residential treatment services regarding status of referral sent on 05/25/14.)    Contact information:   [address] [place], [place] [address] [phone]  Follow-up recommendations:   Activity: as tolerated  Diet: normal  Other: f/up with psychiatrist, f/up with  substance abuse groups, f/up with outpatient rehab   Comments:   Take all your medications as prescribed by your mental healthcare provider.  Report any adverse effects and or reactions from your medicines to your outpatient provider promptly.  Patient is instructed and cautioned to not engage in alcohol and or illegal drug use while on prescription medicines.  In the event of worsening symptoms, patient is instructed to call the crisis hotline, 911 and or go to the nearest ED for appropriate evaluation and treatment of symptoms.  Follow-up with your primary care provider for your other medical issues, concerns and or health care needs.   Total Discharge Time:  Greater than 30 minutes.  SignedFransisca Kaufmann NP-C 05/27/2014, 9:27 AM

## 2014-05-27 NOTE — Progress Notes (Addendum)
Pts family member ,Thayer Ohm, came to pick pt up,. Pt was engaged in a group and wanted to leave once the group was completed. Pt denies SI and HI and contracts for safety. She was given all her prescriptions and discharge instructions. 11:40am-Pt stated,"the staff here saved my life." ": I now realize the importance of life and that I have to be there for my young daughter and my 39 year old daughter who is pregnant again and just got kicked out of her BF house."

## 2014-05-27 NOTE — Progress Notes (Signed)
Patient ID: Shelby Hatfield, female   DOB: [birth year], 38 y.o.   MRN: [id number] Patient ID: Shelby Hatfield, female   DOB: 03-22-1976, 38 y.o.   MRN: [id number] [facility] MD Progress Note  05/27/2014 8:35 AM Shelby Hatfield  MRN:  [id number] Subjective:   " I woke up anxious this morning about d/c today. I feel as close to normal as I ever had"   Objective:   I have reviewed the chart and met with patient.  Pt is happy that her sister is detoxing at a place in [place]. Pt is encouraged and happy that her sister is trying salvage a relationship with her. Sister wants to attend substance abuse classes with the pt for support. Pt had planned to cut her off b/c of sister's continued use. Pt has no plans to continue any substance use and denies cravings. Pt is motivated to find support.  Patient continues to have some withdrawal symptoms, with some residual cramping and aches in stomach, chills and headaches. For the most part it is tolerable. Restless legs have calmed down. She has cut back on eating ice cream.  Overall irritability is decreased. Depression is 2/10. Anxiety is 6/10 and she is asking for Vistaril b/c it helps to calm her down. Energy is low today. Sleep was ok but interrupted b/c of her roommate. Appetite is normal. Concentration remains poor and she is unable to focus on one task. Denies SI/HI, AVH.   At this time patient is tolerating medications well and denies side effects.  Patient has met with SW and at this time her plan is to go back home after discharge, follow up at [facility] for outpatient psychiatric treatment, and contact residential rehab setting until a bed becomes available. Her preference is [facility]. Pt states she doesn't have insurance and would like to be b/c with meds.   Diagnosis:  Opiate Dependence, Opiate Withdrawal, Depression NOS  Total Time spent with patient: 25 minutes   ADL's:  improved  Sleep: good  Appetite:  fair  Suicidal Ideation:  Denies   Homicidal Ideation:  denies AEB (as evidenced by):  Psychiatric Specialty Exam: Physical Exam  Psychiatric: She has a normal mood and affect. Her speech is normal and behavior is normal. Judgment and thought content normal. Cognition and memory are normal.    Review of Systems  Constitutional: Negative for fever and chills.  HENT: Negative for congestion, ear pain and sore throat.   Eyes: Negative for blurred vision and double vision.  Respiratory: Negative for cough and shortness of breath.   Cardiovascular: Negative for chest pain and leg swelling.  Gastrointestinal: Positive for nausea and abdominal pain. Negative for vomiting, diarrhea and constipation.  Musculoskeletal: Positive for back pain and myalgias. Negative for joint pain and neck pain.  Skin: Negative for itching and rash.  Neurological: Positive for headaches. Negative for dizziness, tremors, seizures and weakness.  Psychiatric/Behavioral: Positive for depression. Negative for suicidal ideas, hallucinations and substance abuse. The patient is nervous/anxious. The patient does not have insomnia.     Blood pressure 116/69, pulse 56, temperature 98.6 F (37 C), temperature source Oral, resp. rate 18, height _0  (1.575 m), weight 106.142 kg (234 lb), last menstrual period 04/22/2014, SpO2 100.00%.Body mass index is 42.79 kg/(m^2).  General Appearance: Fairly Groomed  Engineer, water::  Good  Speech:  Normal Rate  Volume:  Normal  Mood:  Anxious  Affect:  Full Range  Thought Process:  Goal Directed and Linear  Orientation:  Full (Time, Place, and Person)  Thought Content:  Negative and no hallucinations, no delusions  Suicidal Thoughts:  No- at this time denying any suicidal or homicidal ideations  Homicidal Thoughts:  No  Memory:  NA  Judgement:  Fair  Insight:  Fair  Psychomotor Activity:  Normal  Concentration:  Good  Recall:  Good  Fund of Knowledge:Good  Language: Good  Akathisia:  Negative  Handed:  Right   AIMS (if indicated):     Assets:  Communication Skills Desire for Improvement Resilience  Sleep:  Number of Hours: 6.75   Musculoskeletal: Strength & Muscle Tone: within normal limits Gait & Station: normal Patient leans: N/A  Current Medications: Current Facility-Administered Medications  Medication Dose Route Frequency Provider Last Rate Last Dose  . alum & mag hydroxide-simeth (MAALOX/MYLANTA) 200-200-20 MG/5ML suspension 30 mL  30 mL Oral Q4H PRN Laverle Hobby, PA-C      . dicyclomine (BENTYL) tablet 20 mg  20 mg Oral Q6H PRN Neita Garnet, MD   20 mg at 05/27/14 0605  . FLUoxetine (PROZAC) capsule 20 mg  20 mg Oral Daily Neita Garnet, MD   20 mg at 05/27/14 3875  . gabapentin (NEURONTIN) capsule 200 mg  200 mg Oral TID Neita Garnet, MD   200 mg at 05/27/14 6433  . hydrOXYzine (ATARAX/VISTARIL) tablet 25 mg  25 mg Oral Q6H PRN Neita Garnet, MD   25 mg at 05/26/14 1938  . ibuprofen (ADVIL,MOTRIN) tablet 400 mg  400 mg Oral Q6H PRN Neita Garnet, MD   400 mg at 05/27/14 0605  . lamoTRIgine (LAMICTAL) tablet 25 mg  25 mg Oral Daily Neita Garnet, MD   25 mg at 05/27/14 2951  . loperamide (IMODIUM) capsule 2-4 mg  2-4 mg Oral PRN Neita Garnet, MD      . LORazepam (ATIVAN) tablet 0.5 mg  0.5 mg Oral BID Neita Garnet, MD   0.5 mg at 05/27/14 8841  . magnesium hydroxide (MILK OF MAGNESIA) suspension 30 mL  30 mL Oral Daily PRN Laverle Hobby, PA-C   30 mL at 05/25/14 0616  . methocarbamol (ROBAXIN) tablet 500 mg  500 mg Oral Q8H PRN Neita Garnet, MD   500 mg at 05/26/14 0841  . nicotine (NICODERM CQ - dosed in mg/24 hours) patch 21 mg  21 mg Transdermal Daily Neita Garnet, MD   21 mg at 05/27/14 6606  . ondansetron (ZOFRAN-ODT) disintegrating tablet 4 mg  4 mg Oral Q6H PRN Neita Garnet, MD   4 mg at 05/26/14 1808  . traZODone (DESYREL) tablet 50 mg  50 mg Oral QHS,MR X 1 Laverle Hobby, PA-C   50 mg at 05/26/14 2146    Lab Results:  No results found for this or  any previous visit (from the past 48 hour(s)).  Physical Findings: AIMS: Facial and Oral Movements Muscles of Facial Expression: None, normal Lips and Perioral Area: None, normal Jaw: None, normal Tongue: None, normal,Extremity Movements Upper (arms, wrists, hands, fingers): None, normal Lower (legs, knees, ankles, toes): None, normal, Trunk Movements Neck, shoulders, hips: None, normal, Overall Severity Severity of abnormal movements (highest score from questions above): None, normal Incapacitation due to abnormal movements: None, normal Patient's awareness of abnormal movements (rate only patient's report): No Awareness, Dental Status Current problems with teeth and/or dentures?: No Does patient usually wear dentures?: No  CIWA:  CIWA-Ar Total: 3 COWS:  COWS Total Score: 0  Assessment-  At this time patient is improved compared to admission, although  she does continue to have some mild and tolerable symptoms of withdrawal. Her mood is less depressed. . She is tolerating medications well.  Wants to go home after discharge, but does plan to try to get into an inpatient rehab program in the near future.  Treatment Plan Summary: Daily contact with patient to assess and evaluate symptoms and progress in treatment Medication management See below  Plan: Plan DISCHARGE Today. (HOME, with follow up at [facility] and recommendation of going to an inpatient rehab and to NA on a daily basis. D/C Ativan prior to discharge- it is being used short term to help with withdrawal related anxiety)   Continue current meds - Prozac 43m po qD, Neurontin 2028mTID, Lamictal 2539mD, Vistaril 88m50m q6hrs prn anxiety, Bentyl 20mg51mr prn stomach cramping, robaxin 500mg 49m8 hr prn muscle spasm      Medical Decision Making Problem Points:  Established problem, stable/improving (1), Review of last therapy session (1) and Review of psycho-social stressors (1) Data Points:  Review of medication  regiment & side effects (2) Review of new medications or change in dosage (2)  I certify that inpatient services furnished can reasonably be expected to improve the patient's condition.   AGARWACharlcie Cradle2015, 8:35 AM

## 2014-05-27 NOTE — Plan of Care (Signed)
Problem: Ineffective individual coping Goal: STG: Pt will be able to identify effective and ineffective STG: Pt will be able to identify effective and ineffective coping patterns  Outcome: Progressing Patient can identify positive coping skills when feeling anxious. She reports using deep breathing exercises and taking warm showers when needed. Goal: STG: Patient will remain free from self harm Patient remained free from harm, 15 min checks in place Goal: STG:Pt. will utilize relaxation techniques to reduce stress STG: Patient will utilize relaxation techniques to reduce stress levels  Outcome: Progressing Patient able to utilize relaxation techniques to reduce stress levels.  Problem: Alteration in mood Goal: LTG-Patient reports reduction in suicidal thoughts (Patient reports reduction in suicidal thoughts and is able to verbalize a safety plan for whenever patient is feeling suicidal)  Outcome: Progressing Patient denies suicidal thoughts.

## 2014-05-27 NOTE — Progress Notes (Signed)
Patient approached writer at beginning of shift and requested visteril for anxiety. Patient reports that she had been informed that she will discharge on tomorrow. Writer inquired if she had other coping skills in place that she uses when feeling anxious. She was able to inform writer that she takes warm showers and uses her deep breathing exercises. Writer praised her and encouraged her to use these coping skills in everyday stressful situations. Patient was receptive. She reports that her ex-boyfriend visited on today and he is supportive of her seeking help. She denies si/hi/a/v hallucinations. Safety maintained on unit with 15 min check.

## 2014-05-27 NOTE — Discharge Summary (Signed)
I saw this patient face to face. I discussed the information with the midlevel provider and I have reviewed the note and agree.  

## 2014-05-27 NOTE — [facility] Group Notes (Signed)
[facility] Group Notes: (Clinical Social Work)   05/27/2014      Type of Therapy:  Group Therapy   Participation Level:  Did Not Attend    Ernesta Trabert Grossman-Orr, LCSW 05/27/2014, 5:28 PM     

## 2014-05-30 NOTE — Progress Notes (Signed)
Patient Discharge Instructions:  After Visit Summary (AVS):   Faxed to:  05/30/14 Discharge Summary Note:   Faxed to:  05/30/14 Psychiatric Admission Assessment Note:   Faxed to:  05/30/14 Suicide Risk Assessment - Discharge Assessment:   Faxed to:  05/30/14 Faxed/Sent to the Next Level Care provider:  05/30/14 Faxed to [facility] @ [phone] Per SW documentation was already sent to [facility].  Jerelene Redden, 05/30/2014, 3:43 PM

## 2014-07-30 ENCOUNTER — Encounter (HOSPITAL_COMMUNITY): Payer: Self-pay | Admitting: Emergency Medicine

## 2014-08-12 ENCOUNTER — Emergency Department (HOSPITAL_COMMUNITY): Payer: Medicaid Other

## 2014-08-12 ENCOUNTER — Encounter (HOSPITAL_COMMUNITY): Payer: Self-pay | Admitting: Emergency Medicine

## 2014-08-12 ENCOUNTER — Emergency Department (HOSPITAL_COMMUNITY)
Admission: EM | Admit: 2014-08-12 | Discharge: 2014-08-12 | Disposition: A | Discharge status: Home / Self Care | Payer: Medicaid Other | Attending: Emergency Medicine | Admitting: Emergency Medicine

## 2014-08-12 DIAGNOSIS — Z720 Tobacco use: Secondary | ICD-10-CM | POA: Insufficient documentation

## 2014-08-12 DIAGNOSIS — F419 Anxiety disorder, unspecified: Secondary | ICD-10-CM | POA: Insufficient documentation

## 2014-08-12 DIAGNOSIS — G43909 Migraine, unspecified, not intractable, without status migrainosus: Secondary | ICD-10-CM | POA: Insufficient documentation

## 2014-08-12 DIAGNOSIS — Z8701 Personal history of pneumonia (recurrent): Secondary | ICD-10-CM | POA: Insufficient documentation

## 2014-08-12 DIAGNOSIS — Z880 Allergy status to penicillin: Secondary | ICD-10-CM | POA: Insufficient documentation

## 2014-08-12 DIAGNOSIS — Z98811 Dental restoration status: Secondary | ICD-10-CM | POA: Insufficient documentation

## 2014-08-12 DIAGNOSIS — F319 Bipolar disorder, unspecified: Secondary | ICD-10-CM | POA: Insufficient documentation

## 2014-08-12 DIAGNOSIS — Z8719 Personal history of other diseases of the digestive system: Secondary | ICD-10-CM | POA: Insufficient documentation

## 2014-08-12 DIAGNOSIS — R05 Cough: Secondary | ICD-10-CM

## 2014-08-12 DIAGNOSIS — J449 Chronic obstructive pulmonary disease, unspecified: Secondary | ICD-10-CM | POA: Insufficient documentation

## 2014-08-12 DIAGNOSIS — R1110 Vomiting, unspecified: Secondary | ICD-10-CM | POA: Insufficient documentation

## 2014-08-12 DIAGNOSIS — M791 Myalgia: Secondary | ICD-10-CM | POA: Insufficient documentation

## 2014-08-12 DIAGNOSIS — Z862 Personal history of diseases of the blood and blood-forming organs and certain disorders involving the immune mechanism: Secondary | ICD-10-CM | POA: Insufficient documentation

## 2014-08-12 DIAGNOSIS — Z79899 Other long term (current) drug therapy: Secondary | ICD-10-CM | POA: Insufficient documentation

## 2014-08-12 DIAGNOSIS — R059 Cough, unspecified: Secondary | ICD-10-CM

## 2014-08-12 DIAGNOSIS — E669 Obesity, unspecified: Secondary | ICD-10-CM | POA: Insufficient documentation

## 2014-08-12 LAB — CBC WITH DIFFERENTIAL/PLATELET
BASOS PCT: 0 % (ref 0–1)
Basophils Absolute: 0 10*3/uL (ref 0.0–0.1)
Eosinophils Absolute: 0.2 10*3/uL (ref 0.0–0.7)
Eosinophils Relative: 3 % (ref 0–5)
HEMATOCRIT: 35.7 % — AB (ref 36.0–46.0)
Hemoglobin: 11.1 g/dL — ABNORMAL LOW (ref 12.0–15.0)
LYMPHS ABS: 2 10*3/uL (ref 0.7–4.0)
Lymphocytes Relative: 27 % (ref 12–46)
MCH: 24.4 pg — ABNORMAL LOW (ref 26.0–34.0)
MCHC: 31.1 g/dL (ref 30.0–36.0)
MCV: 78.6 fL (ref 78.0–100.0)
MONOS PCT: 6 % (ref 3–12)
Monocytes Absolute: 0.5 10*3/uL (ref 0.1–1.0)
NEUTROS ABS: 4.7 10*3/uL (ref 1.7–7.7)
Neutrophils Relative %: 64 % (ref 43–77)
Platelets: 313 10*3/uL (ref 150–400)
RBC: 4.54 MIL/uL (ref 3.87–5.11)
RDW: 16.8 % — ABNORMAL HIGH (ref 11.5–15.5)
WBC: 7.4 10*3/uL (ref 4.0–10.5)

## 2014-08-12 LAB — BASIC METABOLIC PANEL
Anion gap: 12 (ref 5–15)
BUN: 7 mg/dL (ref 6–23)
CHLORIDE: 107 meq/L (ref 96–112)
CO2: 22 mEq/L (ref 19–32)
Calcium: 8.7 mg/dL (ref 8.4–10.5)
Creatinine, Ser: 0.71 mg/dL (ref 0.50–1.10)
GFR calc Af Amer: >90 mL/min (ref >90)
GFR calc non Af Amer: >90 mL/min (ref >90)
GLUCOSE: 86 mg/dL (ref 70–99)
Potassium: 4.5 mEq/L (ref 3.7–5.3)
Sodium: 141 mEq/L (ref 137–147)

## 2014-08-12 MED ORDER — AZITHROMYCIN 250 MG PO TABS: 250.0000 mg | ORAL_TABLET | Freq: Every day | ORAL | Status: DC

## 2014-08-12 MED ORDER — AZITHROMYCIN 250 MG PO TABS
500.0000 mg | ORAL_TABLET | Freq: Once | ORAL | Status: AC
  Administered 2014-08-12: 500 mg via ORAL
  Filled 2014-08-12: qty 2

## 2014-08-12 MED ORDER — BENZONATATE 100 MG PO CAPS
100.0000 mg | ORAL_CAPSULE | Freq: Once | ORAL | Status: AC
  Administered 2014-08-12: 100 mg via ORAL
  Filled 2014-08-12: qty 1

## 2014-08-12 NOTE — Discharge Instructions (Signed)
Use your albuterol inhaler 2 puffs every 4 hours as needed for cough or shortness of breath. Use Robitussin for cough. Take the antibiotic prescribed starting tomorrow. Call any of the numbers on the resource guide to get a new primary care physician. Ask your new doctor to help you to stop smoking.  Emergency Department Resource Guide 1) Find a Doctor and Pay Out of Pocket Although you won't have to find out who is covered by your insurance plan, it is a good idea to ask around and get recommendations. You will then need to call the office and see if the doctor you have chosen will accept you as a new patient and what types of options they offer for patients who are self-pay. Some doctors offer discounts or will set up payment plans for their patients who do not have insurance, but you will need to ask so you aren't surprised when you get to your appointment.  2) Contact Your Local Health Department Not all health departments have doctors that can see patients for sick visits, but many do, so it is worth a call to see if yours does. If you don't know where your local health department is, you can check in your phone book. The CDC also has a tool to help you locate your state's health department, and many state websites also have listings of all of their local health departments.  3) Find a Walk-in Clinic If your illness is not likely to be very severe or complicated, you may want to try a walk in clinic. These are popping up all over the country in pharmacies, drugstores, and shopping centers. They're usually staffed by nurse practitioners or physician assistants that have been trained to treat common illnesses and complaints. They're usually fairly quick and inexpensive. However, if you have serious medical issues or chronic medical problems, these are probably not your best option.  No Primary Care Doctor: - Call Health Connect at  [phone] - they can help you locate a primary care doctor that   accepts your insurance, provides certain services, etc. - Physician Referral Service- [phone]  Chronic Pain Problems: Organization         Address  Phone   Notes  [facility]  [phone]) [phone] Patients need to be referred by their primary care doctor.   Medication Assistance: Organization         Address  Phone   Notes  [facility] [address] [place], [place] [address] [phone]) [phone] --Must be a resident of [place] -- Must have NO insurance coverage whatsoever (no Medicaid/ Medicare, etc.) -- The pt. MUST have a primary care doctor that directs their care regularly and follows them in the community   [facility]  [phone]   [employer]  [phone]) [phone]    Agencies that provide inexpensive medical care: Organization         Address  Phone   Notes  [facility]  [phone]   [facility]    [phone]   [facility] [address] [place], [place] [address] [phone]   [facility] [address], [place] [phone](336) [phone]   [facility]    [phone]) [phone]   [facility]    [phone](336) [phone]   [facility]  [address], [place] Phone:  [phone]) [phone], Fax:  [phone]) [phone] Hours of Operation:  9 am - 6 pm, M-F.  Also accepts Medicaid/Medicare and self-pay.  °[facility] ° [address], [place] Phone: [phone], Fax: [phone]. Hours of Operation:  8:30 am - 5:30 pm, M-F.  Also accepts Medicaid and self-pay.  °[facility] [place] [address], [place] Phone: [phone]) [phone]   °[facility] [address], [place], [place] ([phone], Ext. 123 Mondays & Thursdays: 7-9 AM.  First 15 patients are seen on a first come, first serve basis. °  ° °Medicaid-accepting [place] Providers: ° °Organization          Address  Phone   Notes  °[facility] [address], [place] [phone] Also accepts self-pay patients.  °[facility] [address], [place] ° ([phone]) [phone]   °[facility] [address], [place] ([phone]) [phone]   °[facility] [address] [place] [address], [place] ([phone]) [phone]   °Veita Bland [address], [place]  ° ([phone]) [phone] Only accepts [place] Access Medicaid patients after they have their name applied to their card.  ° °Self-Pay (no insurance) in [place]: ° °Organization         Address  Phone   Notes  °Sickle Cell Patients, [facility] [address], [place] ([phone]) [phone]   °[facility] [address], [place] ([phone]) [phone]   °[facility] [place] ° [address], [place] ([phone]) [phone]   °[facility]/Dr. Osei-Bonsu ° [address] [place] [address], [place] or [address], [place] [phone]) [phone] Phone number for both [place] and [place] locations is the same.  °[facility] [address], [place] ([phone]) [phone]   °[facility] [address] [place] [address], [place] or [address] (336) [phone] °(336) [phone]   °[facility] [address], [place] (336) [phone], phone; ([phone]) [phone], fax Sees patients 1st and 3rd Saturday of every month.  Must not qualify for public or private insurance (i.e. Medicaid, Medicare, [place] Health Choice, Veterans' Benefits) • Household income should be no more than 200% of the poverty level •The clinic cannot treat you if you are pregnant or think you are pregnant • Sexually transmitted diseases are not treated at the clinic.  ° ° °Dental Care: °Organization         Address  Phone  Notes  °[place] [facility] [address],  [place] (336) [phone] Accepts children up to age 21 who are enrolled in Medicaid or [place] Health Choice; pregnant women with a Medicaid card; and children who have applied for Medicaid or [place] Health Choice, but were declined, whose parents can pay a reduced fee at time of service.  °[place] [facility] [place]  [address], [place] ([phone]) [phone] Accepts children up to age 21 who are enrolled in Medicaid or [place] Health Choice; pregnant women with a Medicaid card; and children who have applied for Medicaid or  Health Choice, but were declined, whose parents can pay a reduced fee at time of service.  °[facility] ° [address], [place] ([phone]) [phone] Patients are seen by appointment only. Walk-ins are not accepted. [facility] will see patients 18 years   of age and older. °Monday - Tuesday (8am-5pm) °Most Wednesdays (8:30-5pm) °$30 per visit, cash only  °[facility] ° [address], [place] [phone]) [phone] Patients are seen by appointment only. Walk-ins are not accepted. [facility] will see patients 18 years of age and older. °One Wednesday Evening (Monthly: Volunteer Based).  $30 per visit, cash only  °[facility]  [phone] for adults; Children under age 4, call Graduate Pediatric Dentistry at [phone]. Children aged 4-14, please call [phone] to request a pediatric application. ° Dental services are provided in all areas of dental care including fillings, crowns and bridges, complete and partial dentures, implants, gum treatment, root canals, and extractions. Preventive care is also provided. Treatment is provided to both adults and children. °Patients are selected via a lottery and there is often a waiting list. °  °[facility] [address], °[place] ° ([phone]) [phone] [email or web address] °  °[facility] [address], [place], [place]  (336)[phone], Ext. 123 Second and Fourth Thursday of each month, opens at 6:30 AM; Clinic ends at 9 AM.  Patients are seen on a first-come first-served basis, and a limited number are seen during each clinic.  ° °[facility] ° [address], [place], [place] [phone]) [phone]   Eligibility Requirements °You must have lived in [place], [place], or [place] counties for at least the last three months. °  You cannot be eligible for state or federal sponsored healthcare insurance, including Veterans Administration, Medicaid, or Medicare. °  You generally cannot be eligible for healthcare insurance through your employer.  °  How to apply: °Eligibility screenings are held every Tuesday and Wednesday afternoon from 1:00 pm until 4:00 pm. You do not need an appointment for the interview!  °[facility] [address], [place], [place] [phone]   °[facility]  336-342-8273   °[facility]  336-703-3100   °[facility]  336-570-6415   ° °Behavioral Health Resources in the Community: °Intensive Outpatient Programs °Organization         Address  Phone  Notes  °[facility] [address], [place], [place] [phone]   °[facility] [address], [place], [place] [phone]   °[facility] [address], [place], [place] ° 336-882-2125   °[facility] [address],  °[place], [place] 1-800-853-5163 or 336-641-4981   °Substance Abuse Resources °Organization         Address  Phone  Notes  °[facility]  336-882-2125   °[facility]  336-784-9470   °[facility]  336-285-9073   °[facility]  336-845-3988   °Residential & Outpatient Substance Abuse Program  1-800-659-3381   °Psychological Services °Organization         Address  Phone  Notes  °[facility]  336- [phone]   °[facility]  336- 378-7881    °[facility] [address], [place] 1-800-853-5163 or 336-641-4981   ° °Mobile Crisis Teams °Organization         Address  Phone  Notes  °[facility], Mobile Crisis Care Unit  1-877-626-1772   °Assertive °[facility] ° [address] [place], [place] 336-834-9664   °Sharon DeEsch [address] °[place]  336-554-5454   ° °Self-Help/Support Groups °Organization           Address  Phone             Notes  [place] of [place] - variety of support groups  [place] Call for more information  Narcotics Anonymous (NA), [facility] [address], [employer] [place]  2 meetings at this location   Special educational needs teacher         Address  Phone  Notes  [facility] [place],    [place]  1-212-646-8051   [facility]  [address], [place] [address], [place], [place]   [place], [place] [phone] Admissions: 8am-3pm M-F  [facility] [place] [address]    [place], [place] [phone]   [facility] [address] [place], [place], [place]   [facility] [address],  [place], [place]   [facility] - Intensive Outpatient [address], [place], [place]   [facility]  [place], [place] 1-(430)088-3865 or [phone]   [facility] [address], [place], [place] Accepts Medicaid  [facility] [address],  [place] [phone]-(312)435-8742 Substance Abuse/Addiction Treatment   [facility] Organization         Address  Phone  Notes  [facility]  [phone]   Domenic Schwab, PhD [address] [place], [place]   [phone] or [phone]   [place]  [address] [place], [place] [phone]   [facility] [address], [place], [place] [phone] Insurance/Medicaid/sponsorship through [facility] and Families [address]                                    [place], [place] [phone] [facility] [address], [place] [phone]    Dr. Adele Schilder  [phone]   [facility] Dept. 1) [address] [place] 2) [place] 3)  [address], [place] [phone]  [phone]   [place] [phone] or [phone] (After Hours)

## 2014-08-12 NOTE — ED Provider Notes (Signed)
CSN: [id number]     Arrival date & time 08/12/14  1501 History   First MD Initiated Contact with Patient 08/12/14 1631     Chief Complaint  Patient presents with  . Cough     (Consider location/radiation/quality/duration/timing/severity/associated sxs/prior Treatment) HPI Complains of productive cough with yellow sputum onset 3 days ago. Nothing makes symptoms better or worse. She is treated herself with her albuterol inhaler and Spiriva inhaler with partial reliefalso took ibuprofen and TheraFlu this morning.. Other associated symptoms include fever 102.3 yesterday, vomited twice today. No other associated symptoms.  Nothing makes symptoms better or worse. Past Medical History  Diagnosis Date  . Bipolar 1 disorder   . Migraines   . Wears dentures     upper denture  . Adenotonsillar hypertrophy 03/2012    snores during sleep; denies apnea; states occ. wakes up coughing  . COPD (chronic obstructive pulmonary disease)   . Anemia   . Depression   . Anxiety   . Obesity   . Hypercholesterolemia   . Pneumonia   . Pancreatitis    Past Surgical History  Procedure Laterality Date  . Cesarean section  08/31/2001    total of  3  . Appendectomy    . Cholecystectomy    . Tubal ligation  08/31/2001  . Dilation and evacuation  04/09/2000  . Tonsillectomy and adenoidectomy  04/12/2012    Procedure: TONSILLECTOMY AND ADENOIDECTOMY;  Surgeon: Darletta Moll, MD;  Location: [facility];  Service: ENT;  Laterality: Bilateral;  . Tonsillectomy     Family History  Problem Relation Age of Onset  . Cancer Other    History  Substance Use Topics  . Smoking status: Current Every Day Smoker -- 0.33 packs/day for 18 years    Types: Cigarettes  . Smokeless tobacco: Never Used  . Alcohol Use: No   OB History    Gravida Para Term Preterm AB TAB SAB Ectopic Multiple Living   8 3 3  5  5   3      Review of Systems  Respiratory: Positive for cough.   Gastrointestinal: Positive for  vomiting.  Musculoskeletal: Positive for myalgias.  All other systems reviewed and are negative.     Allergies  Penicillins  Home Medications   Prior to Admission medications   Medication Sig Start Date End Date Taking? Authorizing Provider  albuterol (PROVENTIL HFA;VENTOLIN HFA) 108 (90 BASE) MCG/ACT inhaler Inhale 2 puffs into the lungs 3 (three) times daily as needed for wheezing or shortness of breath.   Yes Historical Provider, MD  FLUoxetine (PROZAC) 20 MG capsule Take 1 capsule (20 mg total) by mouth daily. 05/27/14  Yes Fransisca Kaufmann, NP  gabapentin (NEURONTIN) 100 MG capsule Take 2 capsules (200 mg total) by mouth 3 (three) times daily. 05/27/14  Yes Fransisca Kaufmann, NP  hydrOXYzine (ATARAX/VISTARIL) 25 MG tablet Take 1 tablet (25 mg total) by mouth every 6 (six) hours as needed for anxiety. 05/27/14  Yes Fransisca Kaufmann, NP  lamoTRIgine (LAMICTAL) 25 MG tablet Take 1 tablet (25 mg total) by mouth daily. 05/27/14  Yes Fransisca Kaufmann, NP  tiotropium (SPIRIVA) 18 MCG inhalation capsule Place 1 capsule (18 mcg total) into inhaler and inhale daily. 05/27/14  Yes Fransisca Kaufmann, NP  traZODone (DESYREL) 50 MG tablet Take 1 tablet (50 mg total) by mouth at bedtime and may repeat dose one time if needed. 05/27/14  Yes Fransisca Kaufmann, NP   BP 123/73 mmHg  Pulse 87  Temp(Src) 98.6 F (  37 C) (Oral)  Resp 16  Ht 5\' 1"  (1.549 m)  Wt 220 lb (99.791 kg)  BMI 41.59 kg/m2  SpO2 96%  LMP 07/29/2014 (Approximate) Physical Exam  Constitutional: She appears well-developed and well-nourished.  HENT:  Head: Normocephalic and atraumatic.  Eyes: Conjunctivae are normal. Pupils are equal, round, and reactive to light.  Neck: Neck supple. No tracheal deviation present. No thyromegaly present.  Cardiovascular: Normal rate and regular rhythm.   No murmur heard. Pulmonary/Chest: Effort normal.  Scant diffuse rhonchi  Abdominal: Soft. Bowel sounds are normal. She exhibits no distension. There is no tenderness.  obese   Musculoskeletal: Normal range of motion. She exhibits no edema or tenderness.  Neurological: She is alert. Coordination normal.  Skin: Skin is warm and dry. No rash noted.  Psychiatric: She has a normal mood and affect.  Nursing note and vitals reviewed.   ED Course  Procedures (including critical care time) Labs Review Labs Reviewed  CBC WITH DIFFERENTIAL - Abnormal; Notable for the following:    Hemoglobin 11.1 (*)    HCT 35.7 (*)    MCH 24.4 (*)    RDW 16.8 (*)    All other components within normal limits  BASIC METABOLIC PANEL    Imaging Review Dg Chest 2 View  08/12/2014   CLINICAL DATA:  Cough, fever, and chest congestion for 3 days. History of chronic bronchitis, COPD, diabetes, and pneumonia. Smoker.  EXAM: CHEST  2 VIEW  COMPARISON:  02/10/2014  FINDINGS: Again seen is prominent elevation of the left hemidiaphragm as well as a moderate-sized hiatal hernia. Heart is normal in size and is mildly displaced rightward, unchanged. Chronic bronchitic changes are similar to the prior study. No confluent airspace opacity, pleural effusion, overt pulmonary edema, or pneumothorax is identified. No acute osseous abnormality is seen.  IMPRESSION: Unchanged appearance of the chest without evidence of active cardiopulmonary disease.   Electronically Signed   By: Sebastian AcheAllen  Grady   On: 08/12/2014 16:40     EKG Interpretation None     Chest x-ray viewed by me Results for orders placed or performed during the hospital encounter of 08/12/14  CBC with Differential  Result Value Ref Range   WBC 7.4 4.0 - 10.5 K/uL   RBC 4.54 3.87 - 5.11 MIL/uL   Hemoglobin 11.1 (L) 12.0 - 15.0 g/dL   HCT 16.135.7 (L) 09.636.0 - 04.546.0 %   MCV 78.6 78.0 - 100.0 fL   MCH 24.4 (L) 26.0 - 34.0 pg   MCHC 31.1 30.0 - 36.0 g/dL   RDW 40.916.8 (H) 81.111.5 - 91.415.5 %   Platelets 313 150 - 400 K/uL   Neutrophils Relative % 64 43 - 77 %   Neutro Abs 4.7 1.7 - 7.7 K/uL   Lymphocytes Relative 27 12 - 46 %   Lymphs Abs 2.0 0.7 - 4.0  K/uL   Monocytes Relative 6 3 - 12 %   Monocytes Absolute 0.5 0.1 - 1.0 K/uL   Eosinophils Relative 3 0 - 5 %   Eosinophils Absolute 0.2 0.0 - 0.7 K/uL   Basophils Relative 0 0 - 1 %   Basophils Absolute 0.0 0.0 - 0.1 K/uL  Basic metabolic panel  Result Value Ref Range   Sodium 141 137 - 147 mEq/L   Potassium 4.5 3.7 - 5.3 mEq/L   Chloride 107 96 - 112 mEq/L   CO2 22 19 - 32 mEq/L   Glucose, Bld 86 70 - 99 mg/dL   BUN 7 6 -  23 mg/dL   Creatinine, Ser 1.610.71 0.50 - 1.10 mg/dL   Calcium 8.7 8.4 - 09.610.5 mg/dL   GFR calc non Af Amer >90 >90 mL/min   GFR calc Af Amer >90 >90 mL/min   Anion gap 12 5 - 15   Dg Chest 2 View  08/12/2014   CLINICAL DATA:  Cough, fever, and chest congestion for 3 days. History of chronic bronchitis, COPD, diabetes, and pneumonia. Smoker.  EXAM: CHEST  2 VIEW  COMPARISON:  02/10/2014  FINDINGS: Again seen is prominent elevation of the left hemidiaphragm as well as a moderate-sized hiatal hernia. Heart is normal in size and is mildly displaced rightward, unchanged. Chronic bronchitic changes are similar to the prior study. No confluent airspace opacity, pleural effusion, overt pulmonary edema, or pneumothorax is identified. No acute osseous abnormality is seen.  IMPRESSION: Unchanged appearance of the chest without evidence of active cardiopulmonary disease.   Electronically Signed   By: Sebastian AcheAllen  Grady   On: 08/12/2014 16:40    MDM  Patient at risk for atypical pneumonia given his smoking history age and vomiting. Plan prescription Zithromax.she can take Robitussin for cough. Anemia is chronic I counseled patient for 5 minutes on smoking cessation Final diagnoses:  Cough  she is requesting new primary care physician. She'll be given the resource guide. Diagnosis #1 cough #2 tobacco abuse     Doug SouSam Krystyn Picking, MD 08/12/14 1850

## 2014-08-12 NOTE — ED Notes (Signed)
Pt reports cough and congestion x 3 days with fever. Pt taking Theraflu and ibuprofen otc at home. Last dose 3 hours ago. Pt also reports she vomited x2.

## 2014-08-17 ENCOUNTER — Encounter (HOSPITAL_COMMUNITY): Payer: Self-pay | Admitting: Emergency Medicine

## 2014-08-17 ENCOUNTER — Inpatient Hospital Stay (HOSPITAL_COMMUNITY)
Admission: EM | Admit: 2014-08-17 | Discharge: 2014-08-21 | DRG: 191 | Disposition: A | Discharge status: Home / Self Care | Payer: Medicaid Other | Attending: Internal Medicine | Admitting: Internal Medicine

## 2014-08-17 ENCOUNTER — Emergency Department (HOSPITAL_COMMUNITY): Payer: Medicaid Other

## 2014-08-17 DIAGNOSIS — F329 Major depressive disorder, single episode, unspecified: Secondary | ICD-10-CM | POA: Diagnosis present

## 2014-08-17 DIAGNOSIS — E876 Hypokalemia: Secondary | ICD-10-CM | POA: Diagnosis present

## 2014-08-17 DIAGNOSIS — F1120 Opioid dependence, uncomplicated: Secondary | ICD-10-CM | POA: Diagnosis present

## 2014-08-17 DIAGNOSIS — E669 Obesity, unspecified: Secondary | ICD-10-CM | POA: Diagnosis present

## 2014-08-17 DIAGNOSIS — E11649 Type 2 diabetes mellitus with hypoglycemia without coma: Secondary | ICD-10-CM | POA: Diagnosis present

## 2014-08-17 DIAGNOSIS — J40 Bronchitis, not specified as acute or chronic: Secondary | ICD-10-CM | POA: Diagnosis present

## 2014-08-17 DIAGNOSIS — G43909 Migraine, unspecified, not intractable, without status migrainosus: Secondary | ICD-10-CM | POA: Diagnosis present

## 2014-08-17 DIAGNOSIS — Z9049 Acquired absence of other specified parts of digestive tract: Secondary | ICD-10-CM | POA: Diagnosis present

## 2014-08-17 DIAGNOSIS — E119 Type 2 diabetes mellitus without complications: Secondary | ICD-10-CM | POA: Insufficient documentation

## 2014-08-17 DIAGNOSIS — R0602 Shortness of breath: Secondary | ICD-10-CM

## 2014-08-17 DIAGNOSIS — Z880 Allergy status to penicillin: Secondary | ICD-10-CM

## 2014-08-17 DIAGNOSIS — J441 Chronic obstructive pulmonary disease with (acute) exacerbation: Principal | ICD-10-CM | POA: Diagnosis present

## 2014-08-17 DIAGNOSIS — E780 Pure hypercholesterolemia: Secondary | ICD-10-CM | POA: Diagnosis present

## 2014-08-17 DIAGNOSIS — Z6841 Body Mass Index (BMI) 40.0 and over, adult: Secondary | ICD-10-CM

## 2014-08-17 DIAGNOSIS — R0789 Other chest pain: Secondary | ICD-10-CM | POA: Diagnosis present

## 2014-08-17 DIAGNOSIS — E1169 Type 2 diabetes mellitus with other specified complication: Secondary | ICD-10-CM | POA: Insufficient documentation

## 2014-08-17 DIAGNOSIS — Z720 Tobacco use: Secondary | ICD-10-CM

## 2014-08-17 DIAGNOSIS — F319 Bipolar disorder, unspecified: Secondary | ICD-10-CM | POA: Diagnosis present

## 2014-08-17 DIAGNOSIS — J80 Acute respiratory distress syndrome: Secondary | ICD-10-CM | POA: Diagnosis present

## 2014-08-17 DIAGNOSIS — F3160 Bipolar disorder, current episode mixed, unspecified: Secondary | ICD-10-CM | POA: Diagnosis present

## 2014-08-17 DIAGNOSIS — F419 Anxiety disorder, unspecified: Secondary | ICD-10-CM | POA: Diagnosis present

## 2014-08-17 LAB — CBC WITH DIFFERENTIAL/PLATELET
BASOS PCT: 0 % (ref 0–1)
Basophils Absolute: 0 10*3/uL (ref 0.0–0.1)
EOS PCT: 2 % (ref 0–5)
Eosinophils Absolute: 0.2 10*3/uL (ref 0.0–0.7)
HCT: 32.1 % — ABNORMAL LOW (ref 36.0–46.0)
Hemoglobin: 9.9 g/dL — ABNORMAL LOW (ref 12.0–15.0)
LYMPHS ABS: 2.9 10*3/uL (ref 0.7–4.0)
Lymphocytes Relative: 36 % (ref 12–46)
MCH: 23.2 pg — AB (ref 26.0–34.0)
MCHC: 30.8 g/dL (ref 30.0–36.0)
MCV: 75.4 fL — AB (ref 78.0–100.0)
MONO ABS: 0.4 10*3/uL (ref 0.1–1.0)
MONOS PCT: 5 % (ref 3–12)
Neutro Abs: 4.5 10*3/uL (ref 1.7–7.7)
Neutrophils Relative %: 57 % (ref 43–77)
Platelets: 271 10*3/uL (ref 150–400)
RBC: 4.26 MIL/uL (ref 3.87–5.11)
RDW: 16.4 % — ABNORMAL HIGH (ref 11.5–15.5)
WBC: 8 10*3/uL (ref 4.0–10.5)

## 2014-08-17 LAB — COMPREHENSIVE METABOLIC PANEL
ALBUMIN: 3.1 g/dL — AB (ref 3.5–5.2)
ALT: 11 U/L (ref 0–35)
AST: 16 U/L (ref 0–37)
Alkaline Phosphatase: 84 U/L (ref 39–117)
Anion gap: 15 (ref 5–15)
BUN: 8 mg/dL (ref 6–23)
CO2: 23 mEq/L (ref 19–32)
CREATININE: 0.67 mg/dL (ref 0.50–1.10)
Calcium: 8.8 mg/dL (ref 8.4–10.5)
Chloride: 104 mEq/L (ref 96–112)
GFR calc Af Amer: >90 mL/min (ref >90)
GFR calc non Af Amer: >90 mL/min (ref >90)
GLUCOSE: 97 mg/dL (ref 70–99)
Potassium: 3.7 mEq/L (ref 3.7–5.3)
Sodium: 142 mEq/L (ref 137–147)
TOTAL PROTEIN: 6.4 g/dL (ref 6.0–8.3)
Total Bilirubin: 0.2 mg/dL — ABNORMAL LOW (ref 0.3–1.2)

## 2014-08-17 LAB — I-STAT TROPONIN, ED: TROPONIN I, POC: 0 ng/mL (ref 0.00–0.08)

## 2014-08-17 LAB — PRO B NATRIURETIC PEPTIDE: PRO B NATRI PEPTIDE: 60.5 pg/mL (ref 0–125)

## 2014-08-17 MED ORDER — IPRATROPIUM BROMIDE 0.02 % IN SOLN
0.5000 mg | Freq: Once | RESPIRATORY_TRACT | Status: AC
  Administered 2014-08-17: 0.5 mg via RESPIRATORY_TRACT
  Filled 2014-08-17: qty 2.5

## 2014-08-17 MED ORDER — ALBUTEROL (5 MG/ML) CONTINUOUS INHALATION SOLN
15.0000 mg/h | INHALATION_SOLUTION | Freq: Once | RESPIRATORY_TRACT | Status: AC
  Administered 2014-08-17: 15 mg/h via RESPIRATORY_TRACT
  Filled 2014-08-17: qty 20

## 2014-08-17 MED ORDER — METHYLPREDNISOLONE SODIUM SUCC 125 MG IJ SOLR
125.0000 mg | Freq: Once | INTRAMUSCULAR | Status: AC
  Administered 2014-08-17: 125 mg via INTRAVENOUS
  Filled 2014-08-17: qty 2

## 2014-08-17 NOTE — ED Notes (Signed)
Pt. reports persistent dry cough with chest congestion and SOB/ low grade fever for several days , seen here last week prescribed with Azithromax antibiotic with no improvement .

## 2014-08-17 NOTE — ED Provider Notes (Signed)
CSN: [id number]     Arrival date & time 08/17/14  2233 History   First MD Initiated Contact with Patient 08/17/14 2241     Chief Complaint  Patient presents with  . Cough  . Shortness of Breath     (Consider location/radiation/quality/duration/timing/severity/associated sxs/prior Treatment) The history is provided by the patient.  Dawn Marney DoctorS Lech is a 38 y.o. female hx of bipolar, COPD, smoker here with shortness of breath. Patient has been having some dry cough and congestion for the last week. Also has some low-grade fevers for several days. Was seen in the ER 5 days ago was prescribed Z-Pak. She finished Z-Pak but has not been feeling better. She noticed more shortness of breath. She still smokes but smokes only one cigarette today. Denies any nausea or vomiting. She also states that she has leg swelling. No hx of CAD or CHF.    Past Medical History  Diagnosis Date  . Bipolar 1 disorder   . Migraines   . Wears dentures     upper denture  . Adenotonsillar hypertrophy 03/2012    snores during sleep; denies apnea; states occ. wakes up coughing  . COPD (chronic obstructive pulmonary disease)   . Anemia   . Depression   . Anxiety   . Obesity   . Hypercholesterolemia   . Pneumonia   . Pancreatitis    Past Surgical History  Procedure Laterality Date  . Cesarean section  08/31/2001    total of  3  . Appendectomy    . Cholecystectomy    . Tubal ligation  08/31/2001  . Dilation and evacuation  04/09/2000  . Tonsillectomy and adenoidectomy  04/12/2012    Procedure: TONSILLECTOMY AND ADENOIDECTOMY;  Surgeon: Darletta MollSui W Teoh, MD;  Location: [facility];  Service: ENT;  Laterality: Bilateral;  . Tonsillectomy     Family History  Problem Relation Age of Onset  . Cancer Other    History  Substance Use Topics  . Smoking status: Current Every Day Smoker -- 0.33 packs/day for 18 years    Types: Cigarettes  . Smokeless tobacco: Never Used  . Alcohol Use: No   OB History     Gravida Para Term Preterm AB TAB SAB Ectopic Multiple Living   8 3 3  5  5   3      Review of Systems  Respiratory: Positive for cough and shortness of breath.   All other systems reviewed and are negative.     Allergies  Penicillins  Home Medications   Prior to Admission medications   Medication Sig Start Date End Date Taking? Authorizing Provider  albuterol (PROVENTIL HFA;VENTOLIN HFA) 108 (90 BASE) MCG/ACT inhaler Inhale 2 puffs into the lungs 3 (three) times daily as needed for wheezing or shortness of breath.   Yes Historical Provider, MD  azithromycin (ZITHROMAX Z-PAK) 250 MG tablet Take 1 tablet (250 mg total) by mouth daily. 08/12/14  Yes Doug SouSam Jacubowitz, MD  FLUoxetine (PROZAC) 20 MG capsule Take 1 capsule (20 mg total) by mouth daily. 05/27/14  Yes Fransisca KaufmannLaura Davis, NP  gabapentin (NEURONTIN) 100 MG capsule Take 2 capsules (200 mg total) by mouth 3 (three) times daily. 05/27/14  Yes Fransisca KaufmannLaura Davis, NP  hydrOXYzine (ATARAX/VISTARIL) 25 MG tablet Take 1 tablet (25 mg total) by mouth every 6 (six) hours as needed for anxiety. 05/27/14  Yes Fransisca KaufmannLaura Davis, NP  lamoTRIgine (LAMICTAL) 25 MG tablet Take 1 tablet (25 mg total) by mouth daily. 05/27/14  Yes Fransisca KaufmannLaura Davis, NP  tiotropium (SPIRIVA) 18 MCG inhalation capsule Place 1 capsule (18 mcg total) into inhaler and inhale daily. 05/27/14  Yes Fransisca KaufmannLaura Davis, NP  traZODone (DESYREL) 50 MG tablet Take 1 tablet (50 mg total) by mouth at bedtime and may repeat dose one time if needed. 05/27/14  Yes Fransisca KaufmannLaura Davis, NP   BP 120/68 mmHg  Pulse 112  Temp(Src) 99.6 F (37.6 C) (Oral)  Resp 16  Ht 5\' 2"  (1.575 m)  Wt 235 lb (106.595 kg)  BMI 42.97 kg/m2  SpO2 91%  LMP 07/29/2014 (Approximate) Physical Exam  Constitutional: She is oriented to person, place, and time.  Cough, uncomfortable   HENT:  Head: Normocephalic.  Mouth/Throat: Oropharynx is clear and moist.  Eyes: Conjunctivae are normal. Pupils are equal, round, and reactive to light.  Neck: Normal  range of motion. Neck supple.  Cardiovascular: Normal rate, regular rhythm and normal heart sounds.   Pulmonary/Chest:  Tachypneic, + diffuse wheezing. Increased work of breathing, mild retractions   Abdominal: Soft. Bowel sounds are normal. She exhibits no distension. There is no tenderness. There is no rebound and no guarding.  Musculoskeletal: Normal range of motion.  2+ edema   Neurological: She is alert and oriented to person, place, and time. No cranial nerve deficit. Coordination normal.  Skin: Skin is warm and dry.  Psychiatric: She has a normal mood and affect. Her behavior is normal. Judgment and thought content normal.  Nursing note and vitals reviewed.   ED Course  Procedures (including critical care time) Labs Review Labs Reviewed  CBC WITH DIFFERENTIAL - Abnormal; Notable for the following:    Hemoglobin 9.9 (*)    HCT 32.1 (*)    MCV 75.4 (*)    MCH 23.2 (*)    RDW 16.4 (*)    All other components within normal limits  COMPREHENSIVE METABOLIC PANEL - Abnormal; Notable for the following:    Albumin 3.1 (*)    Total Bilirubin 0.2 (*)    All other components within normal limits  PRO B NATRIURETIC PEPTIDE  I-STAT TROPOININ, ED    Imaging Review Dg Chest Port 1 View  08/17/2014   CLINICAL DATA:  Dry cough with chest congestion and shortness of breath. Fever for several days. Seen last week with antibiotic prescription. No improvement.  EXAM: PORTABLE CHEST - 1 VIEW  COMPARISON:  08/12/2014  FINDINGS: Shallow inspiration with elevation of left hemidiaphragm. Normal heart size and pulmonary vascularity. No focal airspace disease or consolidation in the lungs. No blunting of costophrenic angles. No pneumothorax. Mediastinal contours appear intact.  IMPRESSION: No active disease.   Electronically Signed   By: Burman NievesWilliam  Stevens M.D.   On: 08/17/2014 23:17     EKG Interpretation   Date/Time:  Friday August 17 2014 23:14:28 EST Ventricular Rate:  87 PR Interval:   123 QRS Duration: 101 QT Interval:  374 QTC Calculation: 450 R Axis:   74 Text Interpretation:  Sinus rhythm No significant change since last  tracing Confirmed by Linda Biehn  MD, [place] Lasseter ([id number]) on 08/17/2014 11:36:49 PM      MDM   Final diagnoses:  Shortness of breath   Jenniffer S Iodice is a 38 y.o. female here with SOB, cough. Likely COPD vs new onset CHF. Will get labs, BNP, CXR. Will give continuous neb, steroids.   1:42 AM After nebs, steroids, still tachypneic. Desat to 89-90% with ambulation. Place on oxygen. Will admit for COPD. Labs unremarkable. BNP nl.      Richardean Canalavid H Magalie Almon, MD  08/18/14 0143 

## 2014-08-18 ENCOUNTER — Encounter (HOSPITAL_COMMUNITY): Payer: Self-pay | Admitting: *Deleted

## 2014-08-18 DIAGNOSIS — F1120 Opioid dependence, uncomplicated: Secondary | ICD-10-CM

## 2014-08-18 DIAGNOSIS — F3160 Bipolar disorder, current episode mixed, unspecified: Secondary | ICD-10-CM

## 2014-08-18 DIAGNOSIS — R0789 Other chest pain: Secondary | ICD-10-CM

## 2014-08-18 DIAGNOSIS — J449 Chronic obstructive pulmonary disease, unspecified: Secondary | ICD-10-CM | POA: Insufficient documentation

## 2014-08-18 DIAGNOSIS — J441 Chronic obstructive pulmonary disease with (acute) exacerbation: Principal | ICD-10-CM

## 2014-08-18 DIAGNOSIS — R0602 Shortness of breath: Secondary | ICD-10-CM

## 2014-08-18 LAB — STREP PNEUMONIAE URINARY ANTIGEN: STREP PNEUMO URINARY ANTIGEN: NEGATIVE

## 2014-08-18 LAB — IRON AND TIBC
Iron: 10 ug/dL — ABNORMAL LOW (ref 42–135)
Saturation Ratios: 3 % — ABNORMAL LOW (ref 20–55)
TIBC: 325 ug/dL (ref 250–470)
UIBC: 315 ug/dL (ref 125–400)

## 2014-08-18 LAB — TROPONIN I

## 2014-08-18 LAB — COMPREHENSIVE METABOLIC PANEL
ALBUMIN: 3.4 g/dL — AB (ref 3.5–5.2)
ALK PHOS: 81 U/L (ref 39–117)
ALT: 13 U/L (ref 0–35)
AST: 15 U/L (ref 0–37)
Anion gap: 16 — ABNORMAL HIGH (ref 5–15)
BUN: 9 mg/dL (ref 6–23)
CHLORIDE: 101 meq/L (ref 96–112)
CO2: 23 mEq/L (ref 19–32)
CREATININE: 0.73 mg/dL (ref 0.50–1.10)
Calcium: 8.6 mg/dL (ref 8.4–10.5)
GFR calc Af Amer: >90 mL/min (ref >90)
GFR calc non Af Amer: >90 mL/min (ref >90)
Glucose, Bld: 206 mg/dL — ABNORMAL HIGH (ref 70–99)
Potassium: 3 mEq/L — ABNORMAL LOW (ref 3.7–5.3)
Sodium: 140 mEq/L (ref 137–147)
Total Bilirubin: <0.2 mg/dL — ABNORMAL LOW (ref 0.3–1.2)
Total Protein: 6.7 g/dL (ref 6.0–8.3)

## 2014-08-18 LAB — CBC WITH DIFFERENTIAL/PLATELET
BASOS ABS: 0 10*3/uL (ref 0.0–0.1)
Basophils Relative: 0 % (ref 0–1)
EOS PCT: 0 % (ref 0–5)
Eosinophils Absolute: 0 10*3/uL (ref 0.0–0.7)
HEMATOCRIT: 33.2 % — AB (ref 36.0–46.0)
Hemoglobin: 10.3 g/dL — ABNORMAL LOW (ref 12.0–15.0)
LYMPHS PCT: 13 % (ref 12–46)
Lymphs Abs: 1 10*3/uL (ref 0.7–4.0)
MCH: 23.7 pg — ABNORMAL LOW (ref 26.0–34.0)
MCHC: 31 g/dL (ref 30.0–36.0)
MCV: 76.5 fL — AB (ref 78.0–100.0)
MONO ABS: 0.1 10*3/uL (ref 0.1–1.0)
Monocytes Relative: 1 % — ABNORMAL LOW (ref 3–12)
Neutro Abs: 6.9 10*3/uL (ref 1.7–7.7)
Neutrophils Relative %: 86 % — ABNORMAL HIGH (ref 43–77)
Platelets: 293 10*3/uL (ref 150–400)
RBC: 4.34 MIL/uL (ref 3.87–5.11)
RDW: 16.4 % — AB (ref 11.5–15.5)
WBC: 8 10*3/uL (ref 4.0–10.5)

## 2014-08-18 LAB — I-STAT ARTERIAL BLOOD GAS, ED
Acid-base deficit: 2 mmol/L (ref 0.0–2.0)
Bicarbonate: 23.4 mEq/L (ref 20.0–24.0)
O2 Saturation: 93 %
PH ART: 7.364 (ref 7.350–7.450)
Patient temperature: 99.6
TCO2: 25 mmol/L (ref 0–100)
pCO2 arterial: 41.2 mmHg (ref 35.0–45.0)
pO2, Arterial: 71 mmHg — ABNORMAL LOW (ref 80.0–100.0)

## 2014-08-18 LAB — PREGNANCY, URINE: Preg Test, Ur: NEGATIVE

## 2014-08-18 MED ORDER — METHYLPREDNISOLONE SODIUM SUCC 125 MG IJ SOLR
60.0000 mg | Freq: Four times a day (QID) | INTRAMUSCULAR | Status: DC
  Administered 2014-08-18: 60 mg via INTRAVENOUS
  Filled 2014-08-18: qty 0.96
  Filled 2014-08-18: qty 2
  Filled 2014-08-18 (×3): qty 0.96

## 2014-08-18 MED ORDER — LAMOTRIGINE 25 MG PO TABS
25.0000 mg | ORAL_TABLET | Freq: Every day | ORAL | Status: DC
  Administered 2014-08-18 – 2014-08-21 (×4): 25 mg via ORAL
  Filled 2014-08-18 (×4): qty 1

## 2014-08-18 MED ORDER — MORPHINE SULFATE 4 MG/ML IJ SOLN
4.0000 mg | Freq: Once | INTRAMUSCULAR | Status: AC
  Administered 2014-08-18: 4 mg via INTRAVENOUS
  Filled 2014-08-18: qty 1

## 2014-08-18 MED ORDER — TRAZODONE HCL 50 MG PO TABS
50.0000 mg | ORAL_TABLET | Freq: Every evening | ORAL | Status: DC | PRN
  Administered 2014-08-18 – 2014-08-20 (×6): 50 mg via ORAL
  Filled 2014-08-18 (×8): qty 1

## 2014-08-18 MED ORDER — FLUOXETINE HCL 20 MG PO CAPS
20.0000 mg | ORAL_CAPSULE | Freq: Every day | ORAL | Status: DC
Start: 2014-08-18 — End: 2014-08-21
  Administered 2014-08-18 – 2014-08-21 (×4): 20 mg via ORAL
  Filled 2014-08-18 (×4): qty 1

## 2014-08-18 MED ORDER — POTASSIUM CHLORIDE CRYS ER 20 MEQ PO TBCR
40.0000 meq | EXTENDED_RELEASE_TABLET | Freq: Once | ORAL | Status: AC
  Administered 2014-08-18: 40 meq via ORAL
  Filled 2014-08-18: qty 2

## 2014-08-18 MED ORDER — ENOXAPARIN SODIUM 40 MG/0.4ML ~~LOC~~ SOLN
40.0000 mg | SUBCUTANEOUS | Status: DC
  Administered 2014-08-18 – 2014-08-20 (×3): 40 mg via SUBCUTANEOUS
  Filled 2014-08-18 (×4): qty 0.4

## 2014-08-18 MED ORDER — ALBUTEROL SULFATE HFA 108 (90 BASE) MCG/ACT IN AERS: 2.0000 | INHALATION_SPRAY | Freq: Three times a day (TID) | RESPIRATORY_TRACT | Status: DC | PRN

## 2014-08-18 MED ORDER — INFLUENZA VAC SPLIT QUAD 0.5 ML IM SUSY
0.5000 mL | PREFILLED_SYRINGE | INTRAMUSCULAR | Status: AC
  Administered 2014-08-19: 0.5 mL via INTRAMUSCULAR
  Filled 2014-08-18: qty 0.5

## 2014-08-18 MED ORDER — ACETAMINOPHEN-CODEINE #3 300-30 MG PO TABS
1.0000 | ORAL_TABLET | ORAL | Status: DC | PRN
  Administered 2014-08-18 – 2014-08-20 (×4): 2 via ORAL
  Administered 2014-08-20: 1 via ORAL
  Administered 2014-08-21: 2 via ORAL
  Filled 2014-08-18: qty 2
  Filled 2014-08-18: qty 1
  Filled 2014-08-18 (×4): qty 2

## 2014-08-18 MED ORDER — MAGNESIUM SULFATE 2 GM/50ML IV SOLN
2.0000 g | Freq: Once | INTRAVENOUS | Status: AC
  Administered 2014-08-18: 2 g via INTRAVENOUS
  Filled 2014-08-18: qty 50

## 2014-08-18 MED ORDER — OXYCODONE HCL 5 MG PO TABS
5.0000 mg | ORAL_TABLET | Freq: Four times a day (QID) | ORAL | Status: DC | PRN
  Administered 2014-08-18 – 2014-08-21 (×11): 5 mg via ORAL
  Filled 2014-08-18 (×11): qty 1

## 2014-08-18 MED ORDER — METHYLPREDNISOLONE SODIUM SUCC 125 MG IJ SOLR
80.0000 mg | Freq: Two times a day (BID) | INTRAMUSCULAR | Status: DC
  Administered 2014-08-18 – 2014-08-21 (×6): 80 mg via INTRAVENOUS
  Filled 2014-08-18 (×6): qty 1.28

## 2014-08-18 MED ORDER — BENZONATATE 100 MG PO CAPS
100.0000 mg | ORAL_CAPSULE | Freq: Three times a day (TID) | ORAL | Status: DC | PRN
  Filled 2014-08-18: qty 1

## 2014-08-18 MED ORDER — ACETAMINOPHEN 325 MG PO TABS
650.0000 mg | ORAL_TABLET | Freq: Four times a day (QID) | ORAL | Status: DC | PRN
  Filled 2014-08-18: qty 2

## 2014-08-18 MED ORDER — GUAIFENESIN ER 600 MG PO TB12
600.0000 mg | ORAL_TABLET | Freq: Two times a day (BID) | ORAL | Status: DC
  Administered 2014-08-18 – 2014-08-21 (×8): 600 mg via ORAL
  Filled 2014-08-18 (×9): qty 1

## 2014-08-18 MED ORDER — HYDROXYZINE HCL 25 MG PO TABS
25.0000 mg | ORAL_TABLET | Freq: Four times a day (QID) | ORAL | Status: DC | PRN
Start: 2014-08-18 — End: 2014-08-21
  Administered 2014-08-18 – 2014-08-21 (×2): 25 mg via ORAL
  Filled 2014-08-18 (×2): qty 1

## 2014-08-18 MED ORDER — ALBUTEROL SULFATE (2.5 MG/3ML) 0.083% IN NEBU
3.0000 mL | INHALATION_SOLUTION | Freq: Three times a day (TID) | RESPIRATORY_TRACT | Status: DC | PRN
  Administered 2014-08-18 – 2014-08-19 (×2): 3 mL via RESPIRATORY_TRACT
  Filled 2014-08-18 (×2): qty 3

## 2014-08-18 MED ORDER — BUDESONIDE 0.25 MG/2ML IN SUSP
0.2500 mg | Freq: Two times a day (BID) | RESPIRATORY_TRACT | Status: DC
  Administered 2014-08-18 – 2014-08-21 (×6): 0.25 mg via RESPIRATORY_TRACT
  Filled 2014-08-18 (×9): qty 2

## 2014-08-18 MED ORDER — GABAPENTIN 100 MG PO CAPS
200.0000 mg | ORAL_CAPSULE | Freq: Three times a day (TID) | ORAL | Status: DC
  Administered 2014-08-18 – 2014-08-21 (×11): 200 mg via ORAL
  Filled 2014-08-18 (×12): qty 2

## 2014-08-18 MED ORDER — LEVOFLOXACIN IN D5W 750 MG/150ML IV SOLN
750.0000 mg | INTRAVENOUS | Status: DC
  Administered 2014-08-18 – 2014-08-20 (×3): 750 mg via INTRAVENOUS
  Filled 2014-08-18 (×3): qty 150

## 2014-08-18 NOTE — Plan of Care (Signed)
Problem: ICU Phase Progression Outcomes Goal: Pain controlled with appropriate interventions Outcome: Progressing     

## 2014-08-18 NOTE — Plan of Care (Signed)
Problem: Phase I Progression Outcomes Goal: Tolerating diet Outcome: Completed/Met Date Met:  08/18/14

## 2014-08-18 NOTE — ED Notes (Signed)
Respiratory at bedside for blood gas.

## 2014-08-18 NOTE — Progress Notes (Signed)
Utilization review completed.  

## 2014-08-18 NOTE — ED Notes (Signed)
Ambulated patient down hallway. Patient was diaphoretic and lightheaded. O2 stats stayed at 89 to 90%.

## 2014-08-18 NOTE — Progress Notes (Signed)
Patient seen and examined; admitted after midnight secondary to SOB, productive cough and chills. Had hx of tobacco abuse and COPD. Please referred to Dr. Allena KatzPatel H&P for further info/details on admission. Patient hass been admitted for COPD exacerbation and tracheobronchitis..  Plan: -since patient is complaining of bilateral calf pain (presented with SOB, fever and hypoxia) will check LE duplex. I believe symptoms most likely related low potassium. Will replete electrolytes -start pulmicort -tobacco cessation provided -continue solumedrol, flutter valve, nebulizer tx and antibiotics  Shelby LollMadera, Shelby Hatfield [phone]

## 2014-08-18 NOTE — H&P (Signed)
[facility] History and Physical  Patient: Shelby Hatfield  RUE:[id number]  DOB: March 01, 1976  DOS: the patient was seen and examined on 08/18/2014 PCP: Lonia BloodGARBA,LAWAL, MD  Chief Complaint: Shortness of breath and cough  HPI: Shelby Hatfield is a 38 y.o. female with Past medical history of bipolar disorder, COPD, anxiety, smoker. The patient presented with complaints of cough and shortness of breath. She was seen in the ER 5 days ago with complaints of cough and shortness of breath admission she was found to be stable and was discharged on azithromycin. She has been taking azithromycin but there is no improvement in her symptoms and there was progressive worsening of shortness of breath. Today she also started having complaints of chest tightness with heavy breathing. She mentions that she also has some bilateral shoulder pain which feels like a sharp stabbing pain. She does not use any oxygen at her baseline. She continues to smoke half to 1 pack a day. She denies any choking episode denies any sick contact. She denies any fever or chills denies any leg swelling denies any diarrhea nausea vomiting or choking episode. Denies any rash. Denies any changes in her medication.  The patient is coming from home And at her baseline independent for most of her ADL.  Review of Systems: as mentioned in the history of present illness.  A Comprehensive review of the other systems is negative.  Past Medical History  Diagnosis Date  . Bipolar 1 disorder   . Migraines   . Wears dentures     upper denture  . Adenotonsillar hypertrophy 03/2012    snores during sleep; denies apnea; states occ. wakes up coughing  . COPD (chronic obstructive pulmonary disease)   . Anemia   . Depression   . Anxiety   . Obesity   . Hypercholesterolemia   . Pneumonia   . Pancreatitis    Past Surgical History  Procedure Laterality Date  . Cesarean section  08/31/2001    total of  3  . Appendectomy    .  Cholecystectomy    . Tubal ligation  08/31/2001  . Dilation and evacuation  04/09/2000  . Tonsillectomy and adenoidectomy  04/12/2012    Procedure: TONSILLECTOMY AND ADENOIDECTOMY;  Surgeon: Darletta MollSui W Teoh, MD;  Location: [facility];  Service: ENT;  Laterality: Bilateral;  . Tonsillectomy     Social History:  reports that she has been smoking Cigarettes.  She has a 5.94 pack-year smoking history. She has never used smokeless tobacco. She reports that she does not drink alcohol or use illicit drugs.  Allergies  Allergen Reactions  . Penicillins Itching and Rash    Family History  Problem Relation Age of Onset  . Cancer Other     Prior to Admission medications   Medication Sig Start Date End Date Taking? Authorizing Provider  albuterol (PROVENTIL HFA;VENTOLIN HFA) 108 (90 BASE) MCG/ACT inhaler Inhale 2 puffs into the lungs 3 (three) times daily as needed for wheezing or shortness of breath.   Yes Historical Provider, MD  azithromycin (ZITHROMAX Z-PAK) 250 MG tablet Take 1 tablet (250 mg total) by mouth daily. 08/12/14  Yes Doug SouSam Jacubowitz, MD  FLUoxetine (PROZAC) 20 MG capsule Take 1 capsule (20 mg total) by mouth daily. 05/27/14  Yes Fransisca KaufmannLaura Davis, NP  gabapentin (NEURONTIN) 100 MG capsule Take 2 capsules (200 mg total) by mouth 3 (three) times daily. 05/27/14  Yes Fransisca KaufmannLaura Davis, NP  hydrOXYzine (ATARAX/VISTARIL) 25 MG tablet Take 1 tablet (25 mg  total) by mouth every 6 (six) hours as needed for anxiety. 05/27/14  Yes Fransisca KaufmannLaura Davis, NP  lamoTRIgine (LAMICTAL) 25 MG tablet Take 1 tablet (25 mg total) by mouth daily. 05/27/14  Yes Fransisca KaufmannLaura Davis, NP  tiotropium (SPIRIVA) 18 MCG inhalation capsule Place 1 capsule (18 mcg total) into inhaler and inhale daily. 05/27/14  Yes Fransisca KaufmannLaura Davis, NP  traZODone (DESYREL) 50 MG tablet Take 1 tablet (50 mg total) by mouth at bedtime and may repeat dose one time if needed. 05/27/14  Yes Fransisca KaufmannLaura Davis, NP    Physical Exam: Filed Vitals:   08/18/14 0230 08/18/14  0331 08/18/14 0409 08/18/14 0500  BP: 124/65 122/66 143/63   Pulse: 82 92 86   Temp:  98.4 F (36.9 C) 98.2 F (36.8 C)   TempSrc:  Oral Oral   Resp: 17 20 22    Height:    5\' 2"  (1.575 m)  Weight:    106.5 kg (234 lb 12.6 oz)  SpO2: 91% 91% 95%     General: Alert, Awake and Oriented to Time, Place and Person. Appear in moderate distress Eyes: PERRL ENT: Oral Mucosa clear moist. Neck: no JVD Cardiovascular: S1 and S2 Present, no Murmur, Peripheral Pulses Present Respiratory: Bilateral Air entry equal and Decreased, no Crackles, extensive bilateral upper airway wheezes Abdomen: Bowel Sound present, Soft and non- tender Skin: No Rash Extremities: No Pedal edema, no calf tenderness Neurologic: Grossly no focal neuro deficit.  Labs on Admission:  CBC:  Recent Labs Lab 08/12/14 1535 08/17/14 2305 08/18/14 0320  WBC 7.4 8.0 8.0  NEUTROABS 4.7 4.5 6.9  HGB 11.1* 9.9* 10.3*  HCT 35.7* 32.1* 33.2*  MCV 78.6 75.4* 76.5*  PLT 313 271 293    CMP     Component Value Date/Time   NA 140 08/18/2014 0320   K 3.0* 08/18/2014 0320   CL 101 08/18/2014 0320   CO2 23 08/18/2014 0320   GLUCOSE 206* 08/18/2014 0320   BUN 9 08/18/2014 0320   CREATININE 0.73 08/18/2014 0320   CALCIUM 8.6 08/18/2014 0320   PROT 6.7 08/18/2014 0320   ALBUMIN 3.4* 08/18/2014 0320   AST 15 08/18/2014 0320   ALT 13 08/18/2014 0320   ALKPHOS 81 08/18/2014 0320   BILITOT <0.2* 08/18/2014 0320   GFRNONAA >90 08/18/2014 0320   GFRAA >90 08/18/2014 0320    No results for input(s): LIPASE, AMYLASE in the last 168 hours. No results for input(s): AMMONIA in the last 168 hours.   Recent Labs Lab 08/18/14 0320  TROPONINI <0.30   BNP (last 3 results)  Recent Labs  02/10/14 0020 08/17/14 2302  PROBNP 124.8 60.5    Radiological Exams on Admission: Dg Chest Port 1 View  08/17/2014   CLINICAL DATA:  Dry cough with chest congestion and shortness of breath. Fever for several days. Seen last week with  antibiotic prescription. No improvement.  EXAM: PORTABLE CHEST - 1 VIEW  COMPARISON:  08/12/2014  FINDINGS: Shallow inspiration with elevation of left hemidiaphragm. Normal heart size and pulmonary vascularity. No focal airspace disease or consolidation in the lungs. No blunting of costophrenic angles. No pneumothorax. Mediastinal contours appear intact.  IMPRESSION: No active disease.   Electronically Signed   By: Burman NievesWilliam  Stevens M.D.   On: 08/17/2014 23:17    EKG: Independently reviewed. normal sinus rhythm, nonspecific ST and T waves changes.  Assessment/Plan Principal Problem:   Chest tightness Active Problems:   Mixed bipolar I disorder   Opiate dependence, continuous   COPD exacerbation  1. Chest tightness The patient is presenting with complaint of chest tightness as well as shortness of breath. She does not have any significant changes on her EKG with her serial troponin 2 is negative. Less likely ACS. Continue to closely monitor on telemetry.  2. Acute COPD exacerbation. The patient is presenting with acute on chronic COPD exacerbation. She appears to have acute tracheobronchitis due to upper airway predominant wheezing. I will treat her with IV Solu-Medrol, levofloxacin, duo nebs, Tessalon Perles. Follow cultures. Patient is hypoxic but ABG does not show any significant hypercarbia.  3. Bipolar disorder. Continuing home medications.  Advance goals of care discussion: Full code   DVT Prophylaxis: subcutaneous Heparin Nutrition: Cardiac  Family Communication: Family was present at bedside, opportunity was given to ask question and all questions were answered satisfactorily at the time of interview. Disposition: Admitted to inpatient in telemetry unit.  Author: Lynden Oxford, MD [facility] Pager: [phone] 08/18/2014, 5:44 AM    If 7PM-7AM, please contact night-coverage [email or web address] Password [email or web address]

## 2014-08-19 DIAGNOSIS — R0602 Shortness of breath: Secondary | ICD-10-CM

## 2014-08-19 DIAGNOSIS — R609 Edema, unspecified: Secondary | ICD-10-CM

## 2014-08-19 LAB — BASIC METABOLIC PANEL
ANION GAP: 13 (ref 5–15)
BUN: 14 mg/dL (ref 6–23)
CALCIUM: 9 mg/dL (ref 8.4–10.5)
CO2: 22 mEq/L (ref 19–32)
Chloride: 105 mEq/L (ref 96–112)
Creatinine, Ser: 0.73 mg/dL (ref 0.50–1.10)
GFR calc Af Amer: >90 mL/min (ref >90)
Glucose, Bld: 224 mg/dL — ABNORMAL HIGH (ref 70–99)
Potassium: 5.2 mEq/L (ref 3.7–5.3)
SODIUM: 140 meq/L (ref 137–147)

## 2014-08-19 LAB — GLUCOSE, CAPILLARY
GLUCOSE-CAPILLARY: 209 mg/dL — AB (ref 70–99)
GLUCOSE-CAPILLARY: 247 mg/dL — AB (ref 70–99)

## 2014-08-19 MED ORDER — MORPHINE SULFATE 2 MG/ML IJ SOLN: 2.0000 mg | Freq: Once | INTRAMUSCULAR | Status: DC

## 2014-08-19 MED ORDER — INSULIN ASPART 100 UNIT/ML ~~LOC~~ SOLN
0.0000 [IU] | Freq: Three times a day (TID) | SUBCUTANEOUS | Status: DC
  Administered 2014-08-19: 5 [IU] via SUBCUTANEOUS
  Administered 2014-08-20: 3 [IU] via SUBCUTANEOUS
  Administered 2014-08-20: 5 [IU] via SUBCUTANEOUS
  Administered 2014-08-20: 3 [IU] via SUBCUTANEOUS
  Administered 2014-08-21: 5 [IU] via SUBCUTANEOUS
  Administered 2014-08-21: 3 [IU] via SUBCUTANEOUS

## 2014-08-19 MED ORDER — LORATADINE 10 MG PO TABS
10.0000 mg | ORAL_TABLET | Freq: Every day | ORAL | Status: DC
  Administered 2014-08-19 – 2014-08-21 (×3): 10 mg via ORAL
  Filled 2014-08-19 (×3): qty 1

## 2014-08-19 MED ORDER — SALINE SPRAY 0.65 % NA SOLN
2.0000 | NASAL | Status: DC | PRN
  Administered 2014-08-19 (×2): 2 via NASAL
  Filled 2014-08-19: qty 44

## 2014-08-19 NOTE — Progress Notes (Signed)
[facility] PROGRESS NOTE  Shelby Hatfield RUE:[id number] DOB: [birth year] DOA: 08/17/2014 PCP: Shelby Hatfield,LAWAL, Shelby Hatfield  Assessment/Plan: 1-acute resp distress due to COPD exacerbation and tracheobronchitis  -continue solumedrol, PRN nebulizer, pulmicort and mucinex -will start flutter valve and continue antibiotics -follow clinical response -no DVT on her LE bilat -advise to quit smoking  2-tobacco abuse: continue nicotine patch -cessation counseling provided  3-bipolar disorder: mood stable; no SI or hallucinations -continue home medications  4-hypokalemia: repleted  5-hx of opiate abuse: will be cautious with pain meds and minimize/avoid IV narcotics  6-DVT: lovenox.  Code Status: Full Family Communication: children at bedside (son and daughter) Disposition Plan: remains inpatient; home when breathing stable   Consultants:  None   Procedures:  LE duplex: no evidence of DVT  Antibiotics:  levaquin   HPI/Subjective: Feeling slight better; but still SOB and complaining of pain in her chest with breathing (due to coughing)  Objective: Filed Vitals:   08/19/14 0625  BP: 105/57  Pulse: 64  Temp: 98.3 F (36.8 C)  Resp: 16    Intake/Output Summary (Last 24 hours) at 08/19/14 1505 Last data filed at 08/19/14 0900  Gross per 24 hour  Intake    390 ml  Output      0 ml  Net    390 ml   Filed Weights   08/17/14 2244 08/18/14 0500  Weight: 106.595 kg (235 lb) 106.5 kg (234 lb 12.6 oz)    Exam:   General:  Still complaining of some pain in her chest from coughing; but breathing a little better; no fever  Cardiovascular: S1 and S2, no rubs or gallops  Respiratory: diffuse wheezing, poor air movement, and positive rhonchi  Abdomen: soft, NT, ND, positive BS  Musculoskeletal: no erythema, no warm sensation or cordlike feelings on her legs; positive trace edema bilaterally. No cyanosis  Data Reviewed: Basic Metabolic Panel:  Recent Labs Lab  08/12/14 1535 08/17/14 2305 08/18/14 0320 08/19/14 0540  NA 141 142 140 140  K 4.5 3.7 3.0* 5.2  CL 107 104 101 105  CO2 22 23 23 22   GLUCOSE 86 97 206* 224*  BUN 7 8 9 14   CREATININE 0.71 0.67 0.73 0.73  CALCIUM 8.7 8.8 8.6 9.0   Liver Function Tests:  Recent Labs Lab 08/17/14 2305 08/18/14 0320  AST 16 15  ALT 11 13  ALKPHOS 84 81  BILITOT 0.2* <0.2*  PROT 6.4 6.7  ALBUMIN 3.1* 3.4*   CBC:  Recent Labs Lab 08/12/14 1535 08/17/14 2305 08/18/14 0320  WBC 7.4 8.0 8.0  NEUTROABS 4.7 4.5 6.9  HGB 11.1* 9.9* 10.3*  HCT 35.7* 32.1* 33.2*  MCV 78.6 75.4* 76.5*  PLT 313 271 293   Cardiac Enzymes:  Recent Labs Lab 08/18/14 0320 08/18/14 0857  TROPONINI <0.30 <0.30   BNP (last 3 results)  Recent Labs  02/10/14 0020 08/17/14 2302  PROBNP 124.8 60.5    Studies: Dg Chest Port 1 View  08/17/2014   CLINICAL DATA:  Dry cough with chest congestion and shortness of breath. Fever for several days. Seen last week with antibiotic prescription. No improvement.  EXAM: PORTABLE CHEST - 1 VIEW  COMPARISON:  08/12/2014  FINDINGS: Shallow inspiration with elevation of left hemidiaphragm. Normal heart size and pulmonary vascularity. No focal airspace disease or consolidation in the lungs. No blunting of costophrenic angles. No pneumothorax. Mediastinal contours appear intact.  IMPRESSION: No active disease.   Electronically Signed   By: Shelby Hatfield  Shelby Hatfield M.D.   On:  08/17/2014 23:17    Scheduled Meds: . budesonide (PULMICORT) nebulizer solution  0.25 mg Nebulization BID  . enoxaparin (LOVENOX) injection  40 mg Subcutaneous Q24H  . FLUoxetine  20 mg Oral Daily  . gabapentin  200 mg Oral TID  . guaiFENesin  600 mg Oral BID  . lamoTRIgine  25 mg Oral Daily  . levofloxacin (LEVAQUIN) IV  750 mg Intravenous Q24H  . loratadine  10 mg Oral Daily  . methylPREDNISolone (SOLU-MEDROL) injection  80 mg Intravenous Q12H  . traZODone  50 mg Oral QHS,MR X 1   Continuous Infusions:    Principal Problem:   Chest tightness Active Problems:   Mixed bipolar I disorder   Opiate dependence, continuous   COPD exacerbation   Shortness of breath    Time spent: 30 minutes    Shelby Hatfield, Shelby Hatfield  [facility] Pager [phone]. If 7PM-7AM, please contact night-coverage at [email or web address], password [email or web address] 08/19/2014, 3:05 PM  LOS: 2 days

## 2014-08-19 NOTE — Plan of Care (Signed)
Problem: ICU Phase Progression Outcomes Goal: Dyspnea controlled at rest Outcome: Progressing Goal: Flu/PneumoVaccines if indicated Outcome: Completed/Met Date Met:  08/19/14 Goal: Initial discharge plan identified Outcome: Completed/Met Date Met:  08/19/14 Goal: Voiding-avoid urinary catheter unless indicated Outcome: Completed/Met Date Met:  08/19/14  Problem: Phase I Progression Outcomes Goal: Flu/PneumoVaccines if indicated Outcome: Completed/Met Date Met:  08/19/14 Goal: Pain controlled Outcome: Progressing Goal: Discharge plan established Outcome: Completed/Met Date Met:  08/19/14  Problem: Phase II Progression Outcomes Goal: ADLs completed with minimal assistance Outcome: Completed/Met Date Met:  08/19/14

## 2014-08-19 NOTE — Progress Notes (Signed)
*  PRELIMINARY RESULTS* Vascular Ultrasound Lower extremity venous duplex has been completed.  Preliminary findings: No evidence of DVT  Farrel DemarkJill Eunice, RDMS, RVT  08/19/2014, 10:39 AM

## 2014-08-20 LAB — RESPIRATORY VIRUS PANEL
Adenovirus: NOT DETECTED
INFLUENZA A: NOT DETECTED
Influenza A H1: NOT DETECTED
Influenza A H3: NOT DETECTED
Influenza B: NOT DETECTED
Metapneumovirus: NOT DETECTED
PARAINFLUENZA 2 A: NOT DETECTED
Parainfluenza 1: NOT DETECTED
Parainfluenza 3: NOT DETECTED
Respiratory Syncytial Virus A: NOT DETECTED
Respiratory Syncytial Virus B: NOT DETECTED
Rhinovirus: DETECTED — AB

## 2014-08-20 LAB — GLUCOSE, CAPILLARY
GLUCOSE-CAPILLARY: 202 mg/dL — AB (ref 70–99)
Glucose-Capillary: 198 mg/dL — ABNORMAL HIGH (ref 70–99)
Glucose-Capillary: 194 mg/dL — ABNORMAL HIGH (ref 70–99)
Glucose-Capillary: 229 mg/dL — ABNORMAL HIGH (ref 70–99)
Glucose-Capillary: 182 mg/dL — ABNORMAL HIGH (ref 70–99)
Glucose-Capillary: 264 mg/dL — ABNORMAL HIGH (ref 70–99)

## 2014-08-20 LAB — LEGIONELLA ANTIGEN, URINE

## 2014-08-20 LAB — HEMOGLOBIN A1C
Hgb A1c MFr Bld: 6.5 % — ABNORMAL HIGH (ref <5.7)
MEAN PLASMA GLUCOSE: 140 mg/dL — AB (ref <117)

## 2014-08-20 MED ORDER — LEVOFLOXACIN 750 MG PO TABS
750.0000 mg | ORAL_TABLET | Freq: Every day | ORAL | Status: DC
  Administered 2014-08-20: 750 mg via ORAL
  Filled 2014-08-20 (×2): qty 1

## 2014-08-20 NOTE — Progress Notes (Signed)
[facility] PROGRESS NOTE  Shelby Hatfield HQI:[id number] DOB: [birth year] DOA: 08/17/2014 PCP: Lonia BloodGARBA,LAWAL, MD  Assessment/Plan: 1-acute resp distress due to COPD exacerbation and tracheobronchitis  -continue solumedrol, PRN nebulizer, pulmicort and antitussives  -will start flutter valve and continue antibiotics (but switch to PO) -follow clinical response -no DVT on her LE bilat -advise to quit smoking -strep Pneumoniae in urine neg; legionella pending and resp viral panel pending -will assess Oxygen needs at rest and exertion, to determine home needs  2-tobacco abuse: continue nicotine patch -cessation counseling provided  3-bipolar disorder: mood stable; no SI or hallucinations -continue home medications  4-hypokalemia: repleted -will monitor and continue repletion as needed  5-hx of opiate abuse: will be cautious with pain meds and minimize/avoid IV narcotics  6-DVT: lovenox.  Code Status: Full Family Communication: children at bedside (son and daughter) Disposition Plan: remains inpatient; home when breathing stable   Consultants:  None   Procedures:  LE duplex: no evidence of DVT  Antibiotics:  levaquin   HPI/Subjective: No Acute distress, afebrile and with improvement in her breathing.  Objective: Filed Vitals:   08/20/14 0458  BP: 108/69  Pulse: 61  Temp: 97.7 F (36.5 C)  Resp: 18    Intake/Output Summary (Last 24 hours) at 08/20/14 1141 Last data filed at 08/20/14 1008  Gross per 24 hour  Intake    670 ml  Output      0 ml  Net    670 ml   Filed Weights   08/17/14 2244 08/18/14 0500  Weight: 106.595 kg (235 lb) 106.5 kg (234 lb 12.6 oz)    Exam:   General:  Still complaining of some pain in her chest from coughing (but improved); patient is breathing better, but still with significant wheezing and requiring O2 supplementation. No fever  Cardiovascular: S1 and S2, no rubs or gallops  Respiratory: diffuse wheezing, poor air  movement, and positive rhonchi  Abdomen: soft, NT, ND, positive BS  Musculoskeletal: no erythema, no warm sensation or cordlike feelings on her legs; positive trace edema bilaterally. No cyanosis  Data Reviewed: Basic Metabolic Panel:  Recent Labs Lab 08/17/14 2305 08/18/14 0320 08/19/14 0540  NA 142 140 140  K 3.7 3.0* 5.2  CL 104 101 105  CO2 23 23 22   GLUCOSE 97 206* 224*  BUN 8 9 14   CREATININE 0.67 0.73 0.73  CALCIUM 8.8 8.6 9.0   Liver Function Tests:  Recent Labs Lab 08/17/14 2305 08/18/14 0320  AST 16 15  ALT 11 13  ALKPHOS 84 81  BILITOT 0.2* <0.2*  PROT 6.4 6.7  ALBUMIN 3.1* 3.4*   CBC:  Recent Labs Lab 08/17/14 2305 08/18/14 0320  WBC 8.0 8.0  NEUTROABS 4.5 6.9  HGB 9.9* 10.3*  HCT 32.1* 33.2*  MCV 75.4* 76.5*  PLT 271 293   Cardiac Enzymes:  Recent Labs Lab 08/18/14 0320 08/18/14 0857  TROPONINI <0.30 <0.30   BNP (last 3 results)  Recent Labs  02/10/14 0020 08/17/14 2302  PROBNP 124.8 60.5    Studies: No results found.  Scheduled Meds: . budesonide (PULMICORT) nebulizer solution  0.25 mg Nebulization BID  . enoxaparin (LOVENOX) injection  40 mg Subcutaneous Q24H  . FLUoxetine  20 mg Oral Daily  . gabapentin  200 mg Oral TID  . guaiFENesin  600 mg Oral BID  . insulin aspart  0-15 Units Subcutaneous TID WC  . lamoTRIgine  25 mg Oral Daily  . levofloxacin  750 mg Oral Daily  .  loratadine  10 mg Oral Daily  . methylPREDNISolone (SOLU-MEDROL) injection  80 mg Intravenous Q12H  .  morphine injection  2 mg Intravenous Once  . traZODone  50 mg Oral QHS,MR X 1   Continuous Infusions:   Principal Problem:   Chest tightness Active Problems:   Mixed bipolar I disorder   Opiate dependence, continuous   COPD exacerbation   Shortness of breath    Time spent: 30 minutes    [name], Camay Pedigo  [facility] Pager [phone]. If 7PM-7AM, please contact night-coverage at [email or web address], password [email or web address] 08/20/2014, 11:41 AM   LOS: 3 days

## 2014-08-20 NOTE — Progress Notes (Signed)
Inpatient Diabetes Program Recommendations  AACE/ADA: New Consensus Statement on Inpatient Glycemic Control (2013)  Target Ranges:  Prepandial:   less than 140 mg/dL      Peak postprandial:   less than 180 mg/dL (1-2 hours)      Critically ill patients:  140 - 180 mg/dL   Reason for Assessment: Hyperglycemia  Diabetes history: No history of DM Outpatient Diabetes medications: N/A Current orders for Inpatient glycemic control: Moderate Novolog correction tid  Results for Shelby Hatfield, Shelby S (MRN [id number]) as of 08/20/2014 13:39  Ref. Range 08/19/2014 18:22 08/19/2014 21:03 08/20/2014 00:29 08/20/2014 04:54 08/20/2014 08:02 08/20/2014 12:04  Glucose-Capillary Latest Range: 70-99 mg/dL 914209 (H) 782247 (H) 956202 (H) 198 (H) 194 (H) 229 (H)   Note:  May benefit from addition of 3 to 4 units Novolog meal coverage tid with meals in addition to correction to help prevent some of her hyperglycemia while on steroids.  (To be given if patient eats at least 50% and CBG is at least 80 mg/dl.)  Thank you.  Shelby Achorn S. Elsie Lincolnouth, RN, CNS, CDE Inpatient Diabetes Program, team pager [phone]

## 2014-08-21 DIAGNOSIS — E1169 Type 2 diabetes mellitus with other specified complication: Secondary | ICD-10-CM | POA: Insufficient documentation

## 2014-08-21 DIAGNOSIS — E669 Obesity, unspecified: Secondary | ICD-10-CM

## 2014-08-21 DIAGNOSIS — E119 Type 2 diabetes mellitus without complications: Secondary | ICD-10-CM

## 2014-08-21 LAB — GLUCOSE, CAPILLARY
GLUCOSE-CAPILLARY: 209 mg/dL — AB (ref 70–99)
Glucose-Capillary: 191 mg/dL — ABNORMAL HIGH (ref 70–99)

## 2014-08-21 MED ORDER — BUDESONIDE-FORMOTEROL FUMARATE 160-4.5 MCG/ACT IN AERO: 2.0000 | INHALATION_SPRAY | Freq: Two times a day (BID) | RESPIRATORY_TRACT | Status: DC

## 2014-08-21 MED ORDER — PREDNISONE 20 MG PO TABS: ORAL_TABLET | ORAL | Status: DC

## 2014-08-21 MED ORDER — DOXYCYCLINE HYCLATE 100 MG PO TABS: 100.0000 mg | ORAL_TABLET | Freq: Two times a day (BID) | ORAL | Status: DC

## 2014-08-21 MED ORDER — LORATADINE 10 MG PO TABS: 10.0000 mg | ORAL_TABLET | Freq: Every day | ORAL | Status: DC

## 2014-08-21 MED ORDER — GUAIFENESIN ER 600 MG PO TB12: 600.0000 mg | ORAL_TABLET | Freq: Two times a day (BID) | ORAL | Status: DC

## 2014-08-21 MED ORDER — METFORMIN HCL 500 MG PO TABS: 500.0000 mg | ORAL_TABLET | Freq: Two times a day (BID) | ORAL | Status: DC

## 2014-08-21 MED ORDER — BLOOD GLUCOSE METER KIT: PACK | Status: DC

## 2014-08-21 NOTE — Progress Notes (Signed)
Patient discharge teaching given, including activity, diet, follow-up appoints, and medications. Patient verbalized understanding of all discharge instructions. IV access was d/c'd. Vitals are stable. Skin is intact except as charted in most recent assessments. Pt to be escorted out by NT, to be driven home by family. 

## 2014-08-21 NOTE — Progress Notes (Signed)
SATURATION QUALIFICATIONS: (This note is used to comply with regulatory documentation for home oxygen)  Patient Saturations on Room Air at Rest = 92%  Patient Saturations on Room Air while Ambulating = 88%  Patient Saturations on 2 Liters of oxygen while Ambulating = 92%  Please briefly explain why patient needs home oxygen: 

## 2014-08-21 NOTE — Care Management Note (Addendum)
    Page 1 of 1   08/21/2014     4:32:20 PM CARE MANAGEMENT NOTE 08/21/2014  Patient:  Maisie FusMEDFORD,Annajulia S   Account Number:  [id number]  Date Initiated:  08/21/2014  Documentation initiated by:  Letha CapeAYLOR,Syanna Remmert  Subjective/Objective Assessment:   dx copd  admit-from home.     Action/Plan:   Anticipated DC Date:  08/21/2014   Anticipated DC Plan:  HOME/SELF CARE      DC Planning Services  CM consult  Follow-up appt scheduled  MATCH Program      [facility]  DURABLE MEDICAL EQUIPMENT   Choice offered to / List presented to:  C-1 Patient   DME arranged  OXYGEN      DME agency  [facility]        Status of service:  Completed, signed off Medicare Important Message given?  NO (If response is "NO", the following Medicare IM given date fields will be blank) Date Medicare IM given:   Medicare IM given by:   Date Additional Medicare IM given:   Additional Medicare IM given by:    Discharge Disposition:  HOME/SELF CARE  Per UR Regulation:  Reviewed for med. necessity/level of care/duration of stay  If discussed at Long Length of Stay Meetings, dates discussed:    Comments:  08/21/14 1509 Letha Capeeborah Pamla Pangle RN, BSN [phone] patient is from home and is for dc today .  Patient will need ast with meds, [facility] pharmacy is closed.  NCM assited patient with Match program,  also notified [facility] for home oxygen.

## 2014-08-21 NOTE — Discharge Summary (Signed)
Physician Discharge Summary  Shelby Hatfield HCW:[id number] DOB: 10-06-1975 DOA: 08/17/2014  PCP: Barbette Merino, MD  Admit date: 08/17/2014 Discharge date: 08/21/2014  Time spent: >30 minutes  Recommendations for Outpatient Follow-up:  Follow CBG and A1C closely; patient started on metformin Arrange follow up with pulmonologist for PFT's  Discharge Diagnoses:  Principal Problem:   Chest tightness Active Problems:   Mixed bipolar I disorder   Opiate dependence, continuous   COPD exacerbation   Shortness of breath type 2 diabetes  Discharge Condition: stable and improved.  Diet recommendation: low carbohydrates diet  Filed Weights   08/17/14 2244 08/18/14 0500  Weight: 106.595 kg (235 lb) 106.5 kg (234 lb 12.6 oz)    History of present illness:  38y/o female with PMH significant for tobacco abuse, COPD and bipolar disorders; presented with worsening SOB and productive cough for approx 4 days prior to admission. Despite long nebulizer breathing treatments, she continue to struggle to breath and was admitted for further evaluation and treatment.  Hospital Course:  1-acute resp distress due to COPD exacerbation and tracheobronchitis  -tapering steroids and continue use of PRN inhalers   -will discharge with continue use of flutter valve, symbicort inhaler BID and continue antibiotics (doxycycline to finish treatment) -no DVT on her LE bilat -advise to quit smoking -strep Pneumoniae in urine neg and legionella negative. Patient Resp panel positive for Rhinovirus -patient demonstrated to require oxygen supplementation on exertion; Home oxygen arranged at discharge  2-tobacco abuse: continue nicotine patch (patient instructed to buy OTC nicotine patch) -cessation counseling provided  3-bipolar disorder: mood stable; no SI or hallucinations -continue home medications  4-hypokalemia: repleted -K WNL at discharge  5-hx of opiate abuse: very cautious with pain meds during  admission and no narcotics prescribed at discharge  6-type 2 diabetes: patient discharge on metformin -will need close follow up from PCP, for further adjustment to her hypoglycemic regimen -advise to follow low carb diet  Procedures:  See below for x-ray reports  LE duplex: neg for DVT  Consultations:  None   Discharge Exam: Filed Vitals:   08/21/14 0522  BP: 118/59  Pulse: 51  Temp: 97.8 F (36.6 C)  Resp: 20    General: Still complaining of some pain in her chest from coughing (but much improved); patient is breathing better and with just mild end exp wheezing and requiring O2 supplementation just for exertion (2L [place]). No Fever  Cardiovascular: S1 and S2, no rubs or gallops  Respiratory: mild end exp wheezing, improve air movement, and scattered rhonchi  Abdomen: soft, NT, ND, positive BS  Musculoskeletal: no erythema, no warm sensation or cordlike feelings on her legs; positive trace edema bilaterally. No cyanosis or clubbing   Discharge Instructions You were cared for by a hospitalist during your hospital stay. If you have any questions about your discharge medications or the care you received while you were in the hospital after you are discharged, you can call the unit and asked to speak with the hospitalist on call if the hospitalist that took care of you is not available. Once you are discharged, your primary care physician will handle any further medical issues. Please note that NO REFILLS for any discharge medications will be authorized once you are discharged, as it is imperative that you return to your primary care physician (or establish a relationship with a primary care physician if you do not have one) for your aftercare needs so that they can reassess your need for medications and monitor your  lab values.  Discharge Instructions    Diet - low sodium heart healthy    Complete by:  As directed      Discharge instructions    Complete by:  As directed   Stop  smoking Take medications as prescribed Follow a low carbohydrates diet Follow with PCP as instructed Limit exposure to low temp to prevent bronchospasm and inside the house use humidifier with the heating Oxygen has been arranged for you to use mainly on exertion (2L)          Current Discharge Medication List    START taking these medications   Details  Blood Glucose Monitoring Suppl (BLOOD GLUCOSE METER KIT AND SUPPLIES) Dispense based on patient and insurance preference. Use to check sugar two times a day (fasting in am and at bedtime). (FOR ICD-9 250.00, 250.01). Please provide the glucometer with cheapest strips Qty: 1 each, Refills: 5    budesonide-formoterol (SYMBICORT) 160-4.5 MCG/ACT inhaler Inhale 2 puffs into the lungs 2 (two) times daily. Qty: 1 Inhaler, Refills: 12    doxycycline (VIBRA-TABS) 100 MG tablet Take 1 tablet (100 mg total) by mouth 2 (two) times daily. Qty: 14 tablet, Refills: 0    guaiFENesin (MUCINEX) 600 MG 12 hr tablet Take 1 tablet (600 mg total) by mouth 2 (two) times daily. Qty: 60 tablet, Refills: 0    loratadine (CLARITIN) 10 MG tablet Take 1 tablet (10 mg total) by mouth daily.    metFORMIN (GLUCOPHAGE) 500 MG tablet Take 1 tablet (500 mg total) by mouth 2 (two) times daily with a meal. Qty: 60 tablet, Refills: 1    predniSONE (DELTASONE) 20 MG tablet Take 3 tablets by mouth daily X 2 days; then 2 tablets by mouth daily X 2 days; then 1 tablet by mouth daily X 2 days; then 1/2 tablet by mouth daily X 3 days and stop prednisone. Qty: 15 tablet, Refills: 0      CONTINUE these medications which have NOT CHANGED   Details  albuterol (PROVENTIL HFA;VENTOLIN HFA) 108 (90 BASE) MCG/ACT inhaler Inhale 2 puffs into the lungs 3 (three) times daily as needed for wheezing or shortness of breath.    FLUoxetine (PROZAC) 20 MG capsule Take 1 capsule (20 mg total) by mouth daily. Qty: 30 capsule, Refills: 0    gabapentin (NEURONTIN) 100 MG capsule Take 2  capsules (200 mg total) by mouth 3 (three) times daily. Qty: 180 capsule, Refills: 0    hydrOXYzine (ATARAX/VISTARIL) 25 MG tablet Take 1 tablet (25 mg total) by mouth every 6 (six) hours as needed for anxiety. Qty: 30 tablet, Refills: 0    lamoTRIgine (LAMICTAL) 25 MG tablet Take 1 tablet (25 mg total) by mouth daily. Qty: 30 tablet, Refills: 0    tiotropium (SPIRIVA) 18 MCG inhalation capsule Place 1 capsule (18 mcg total) into inhaler and inhale daily. Qty: 30 capsule, Refills: 12    traZODone (DESYREL) 50 MG tablet Take 1 tablet (50 mg total) by mouth at bedtime and may repeat dose one time if needed. Qty: 60 tablet, Refills: 0      STOP taking these medications     azithromycin (ZITHROMAX Z-PAK) 250 MG tablet        Allergies  Allergen Reactions  . Penicillins Itching and Rash   Follow-up Information    Follow up with [facility]     On 08/30/2014.   Why:  11:15 am for hospital follow up   Contact information:  [address] [place] [address] [phone]       The results of significant diagnostics from this hospitalization (including imaging, microbiology, ancillary and laboratory) are listed below for reference.    Significant Diagnostic Studies: Dg Chest 2 View  08/12/2014   CLINICAL DATA:  Cough, fever, and chest congestion for 3 days. History of chronic bronchitis, COPD, diabetes, and pneumonia. Smoker.  EXAM: CHEST  2 VIEW  COMPARISON:  02/10/2014  FINDINGS: Again seen is prominent elevation of the left hemidiaphragm as well as a moderate-sized hiatal hernia. Heart is normal in size and is mildly displaced rightward, unchanged. Chronic bronchitic changes are similar to the prior study. No confluent airspace opacity, pleural effusion, overt pulmonary edema, or pneumothorax is identified. No acute osseous abnormality is seen.  IMPRESSION: Unchanged appearance of the chest without evidence of active  cardiopulmonary disease.   Electronically Signed   By: Logan Bores   On: 08/12/2014 16:40   Dg Chest Port 1 View  08/17/2014   CLINICAL DATA:  Dry cough with chest congestion and shortness of breath. Fever for several days. Seen last week with antibiotic prescription. No improvement.  EXAM: PORTABLE CHEST - 1 VIEW  COMPARISON:  08/12/2014  FINDINGS: Shallow inspiration with elevation of left hemidiaphragm. Normal heart size and pulmonary vascularity. No focal airspace disease or consolidation in the lungs. No blunting of costophrenic angles. No pneumothorax. Mediastinal contours appear intact.  IMPRESSION: No active disease.   Electronically Signed   By: Lucienne Capers M.D.   On: 08/17/2014 23:17    Microbiology: Recent Results (from the past 240 hour(s))  Respiratory virus panel (routine influenza)     Status: Abnormal   Collection Time: 08/19/14 11:22 AM  Result Value Ref Range Status   Source - RVPAN NASAL SWAB  Corrected    Comment: CORRECTED ON 11/23 AT 2119: PREVIOUSLY REPORTED AS NASAL SWAB   Respiratory Syncytial Virus A NOT DETECTED  Final   Respiratory Syncytial Virus B NOT DETECTED  Final   Influenza A NOT DETECTED  Final   Influenza B NOT DETECTED  Final   Parainfluenza 1 NOT DETECTED  Final   Parainfluenza 2 NOT DETECTED  Final   Parainfluenza 3 NOT DETECTED  Final   Metapneumovirus NOT DETECTED  Final   Rhinovirus DETECTED (A)  Final   Adenovirus NOT DETECTED  Final   Influenza A H1 NOT DETECTED  Final   Influenza A H3 NOT DETECTED  Final    Comment: (NOTE)       Normal Reference Range for each Analyte: NOT DETECTED Testing performed using the Luminex xTAG Respiratory Viral Panel test kit. The analytical performance characteristics of this assay have been determined by [employer].  The modifications have not been cleared or approved by the FDA. This assay has been validated pursuant to the CLIA regulations and is used for clinical purposes. Performed at  [employer]: Basic Metabolic Panel:  Recent Labs Lab 08/17/14 2305 08/18/14 0320 08/19/14 0540  NA 142 140 140  K 3.7 3.0* 5.2  CL 104 101 105  CO2 _0 GLUCOSE 97 206* 224*  BUN _1 CREATININE 0.67 0.73 0.73  CALCIUM 8.8 8.6 9.0   Liver Function Tests:  Recent Labs Lab 08/17/14 2305 08/18/14 0320  AST 16 15  ALT 11 13  ALKPHOS 84 81  BILITOT 0.2* <0.2*  PROT 6.4 6.7  ALBUMIN 3.1* 3.4*  CBC:  Recent Labs Lab 08/17/14 2305 08/18/14 0320  WBC 8.0 8.0  NEUTROABS 4.5 6.9  HGB 9.9* 10.3*  HCT 32.1* 33.2*  MCV 75.4* 76.5*  PLT 271 293   Cardiac Enzymes:  Recent Labs Lab 08/18/14 0320 08/18/14 0857  TROPONINI <0.30 <0.30   BNP: BNP (last 3 results)  Recent Labs  02/10/14 0020 08/17/14 2302  PROBNP 124.8 60.5   CBG:  Recent Labs Lab 08/20/14 1204 08/20/14 1703 08/20/14 2133 08/21/14 0828 08/21/14 1221  GLUCAP 229* 182* 264* 209* 191*    Signed:  Barton Dubois  [facility] 08/21/2014, 1:09 PM

## 2014-08-30 ENCOUNTER — Ambulatory Visit: Payer: MEDICAID | Attending: Internal Medicine | Admitting: Internal Medicine

## 2014-08-30 ENCOUNTER — Encounter: Payer: Self-pay | Admitting: Internal Medicine

## 2014-08-30 VITALS — BP 114/83 | HR 74 | Temp 98.0°F | Resp 16 | Wt 230.4 lb

## 2014-08-30 DIAGNOSIS — J441 Chronic obstructive pulmonary disease with (acute) exacerbation: Secondary | ICD-10-CM | POA: Insufficient documentation

## 2014-08-30 DIAGNOSIS — F3160 Bipolar disorder, current episode mixed, unspecified: Secondary | ICD-10-CM | POA: Insufficient documentation

## 2014-08-30 DIAGNOSIS — Z9981 Dependence on supplemental oxygen: Secondary | ICD-10-CM | POA: Insufficient documentation

## 2014-08-30 DIAGNOSIS — Z0000 Encounter for general adult medical examination without abnormal findings: Secondary | ICD-10-CM

## 2014-08-30 DIAGNOSIS — Z09 Encounter for follow-up examination after completed treatment for conditions other than malignant neoplasm: Secondary | ICD-10-CM | POA: Insufficient documentation

## 2014-08-30 DIAGNOSIS — Z87898 Personal history of other specified conditions: Secondary | ICD-10-CM | POA: Insufficient documentation

## 2014-08-30 DIAGNOSIS — Z79899 Other long term (current) drug therapy: Secondary | ICD-10-CM | POA: Insufficient documentation

## 2014-08-30 DIAGNOSIS — Z139 Encounter for screening, unspecified: Secondary | ICD-10-CM

## 2014-08-30 DIAGNOSIS — Z87891 Personal history of nicotine dependence: Secondary | ICD-10-CM | POA: Insufficient documentation

## 2014-08-30 DIAGNOSIS — F419 Anxiety disorder, unspecified: Secondary | ICD-10-CM | POA: Insufficient documentation

## 2014-08-30 DIAGNOSIS — Z23 Encounter for immunization: Secondary | ICD-10-CM | POA: Insufficient documentation

## 2014-08-30 DIAGNOSIS — E119 Type 2 diabetes mellitus without complications: Secondary | ICD-10-CM | POA: Insufficient documentation

## 2014-08-30 DIAGNOSIS — E669 Obesity, unspecified: Secondary | ICD-10-CM | POA: Insufficient documentation

## 2014-08-30 DIAGNOSIS — E780 Pure hypercholesterolemia: Secondary | ICD-10-CM | POA: Insufficient documentation

## 2014-08-30 DIAGNOSIS — E139 Other specified diabetes mellitus without complications: Secondary | ICD-10-CM

## 2014-08-30 LAB — GLUCOSE, POCT (MANUAL RESULT ENTRY): POC Glucose: 137 mg/dl — AB (ref 70–99)

## 2014-08-30 NOTE — Patient Instructions (Signed)
Diabetes Mellitus and Food It is important for you to manage your blood sugar (glucose) level. Your blood glucose level can be greatly affected by what you eat. Eating healthier foods in the appropriate amounts throughout the day at about the same time each day will help you control your blood glucose level. It can also help slow or prevent worsening of your diabetes mellitus. Healthy eating may even help you improve the level of your blood pressure and reach or maintain a healthy weight.  HOW CAN FOOD AFFECT ME? Carbohydrates Carbohydrates affect your blood glucose level more than any other type of food. Your dietitian will help you determine how many carbohydrates to eat at each meal and teach you how to count carbohydrates. Counting carbohydrates is important to keep your blood glucose at a healthy level, especially if you are using insulin or taking certain medicines for diabetes mellitus. Alcohol Alcohol can cause sudden decreases in blood glucose (hypoglycemia), especially if you use insulin or take certain medicines for diabetes mellitus. Hypoglycemia can be a life-threatening condition. Symptoms of hypoglycemia (sleepiness, dizziness, and disorientation) are similar to symptoms of having too much alcohol.  If your health care provider has given you approval to drink alcohol, do so in moderation and use the following guidelines:  Women should not have more than one drink per day, and men should not have more than two drinks per day. One drink is equal to:  12 oz of beer.  5 oz of wine.  1 oz of hard liquor.  Do not drink on an empty stomach.  Keep yourself hydrated. Have water, diet soda, or unsweetened iced tea.  Regular soda, juice, and other mixers might contain a lot of carbohydrates and should be counted. WHAT FOODS ARE NOT RECOMMENDED? As you make food choices, it is important to remember that all foods are not the same. Some foods have fewer nutrients per serving than other  foods, even though they might have the same number of calories or carbohydrates. It is difficult to get your body what it needs when you eat foods with fewer nutrients. Examples of foods that you should avoid that are high in calories and carbohydrates but low in nutrients include:  Trans fats (most processed foods list trans fats on the Nutrition Facts label).  Regular soda.  Juice.  Candy.  Sweets, such as cake, pie, doughnuts, and cookies.  Fried foods. WHAT FOODS CAN I EAT? Have nutrient-rich foods, which will nourish your body and keep you healthy. The food you should eat also will depend on several factors, including:  The calories you need.  The medicines you take.  Your weight.  Your blood glucose level.  Your blood pressure level.  Your cholesterol level. You also should eat a variety of foods, including:  Protein, such as meat, poultry, fish, tofu, nuts, and seeds (lean animal proteins are best).  Fruits.  Vegetables.  Dairy products, such as milk, cheese, and yogurt (low fat is best).  Breads, grains, pasta, cereal, rice, and beans.  Fats such as olive oil, trans fat-free margarine, canola oil, avocado, and olives. DOES EVERYONE WITH DIABETES MELLITUS HAVE THE SAME MEAL PLAN? Because every person with diabetes mellitus is different, there is not one meal plan that works for everyone. It is very important that you meet with a dietitian who will help you create a meal plan that is just right for you. Document Released: 06/11/2005 Document Revised: 09/19/2013 Document Reviewed: 08/11/2013 Exit[employer] Patient Information 2015 [employer]. This   information is not intended to replace advice given to you by your health care provider. Make sure you discuss any questions you have with your health care provider.  

## 2014-08-30 NOTE — Progress Notes (Signed)
Patient here to establish care Has history or COPD  Recently diagnosed with diabetes and currently taking metformin Patient did state her AM fasting sugars have been running in the 200's

## 2014-08-30 NOTE — Progress Notes (Signed)
Patient Demographics  Shelby Hatfield, is a 38 y.o. female  PZW:[id number]  [id number]  DOB - Aug 10, 1976  CC:  Chief Complaint  Patient presents with  . Hospitalization Follow-up    establish care       HPI: Shelby Hatfield is a 38 y.o. female here today to establish medical care.she has history of tobacco abuse, COPD bipolar disorder, recently hospitalized with symptoms of shortness of breath productive cough, EMR reviewed her she had acute respiratory distress secondary to COPD exacerbation tracheobronchitis, patient was treated and discharged on antibiotic doxycycline was given tapering dose of steroid patient also required oxygen supplementation on exertion the arrangements were done patient was advised to quit smoking patient also history of narcotic abuse in the past she has type 2 diabetes and is on metformin, her last hemoglobin A1c was 6.5%. Patient acute symptoms. Used to be on medication for bipolar disorder but has not followed  with her psychiatrist. Patient denies any SI or HI. Patient has No headache, No chest pain, No abdominal pain - No Nausea, No new weakness tingling or numbness, No Cough - SOB.  Allergies  Allergen Reactions  . Penicillins Itching and Rash   Past Medical History  Diagnosis Date  . Bipolar 1 disorder   . Migraines   . Wears dentures     upper denture  . Adenotonsillar hypertrophy 03/2012    snores during sleep; denies apnea; states occ. wakes up coughing  . COPD (chronic obstructive pulmonary disease)   . Anemia   . Depression   . Anxiety   . Obesity   . Hypercholesterolemia   . Pneumonia   . Pancreatitis   . Diabetes mellitus without complication    Current Outpatient Prescriptions on File Prior to Visit  Medication Sig Dispense Refill  . albuterol (PROVENTIL HFA;VENTOLIN HFA) 108 (90 BASE) MCG/ACT inhaler Inhale 2 puffs into the lungs 3 (three) times daily as needed for wheezing or shortness of breath.    . Blood Glucose Monitoring  Suppl (BLOOD GLUCOSE METER KIT AND SUPPLIES) Dispense based on patient and insurance preference. Use to check sugar two times a day (fasting in am and at bedtime). (FOR ICD-9 250.00, 250.01). Please provide the glucometer with cheapest strips 1 each 5  . budesonide-formoterol (SYMBICORT) 160-4.5 MCG/ACT inhaler Inhale 2 puffs into the lungs 2 (two) times daily. 1 Inhaler 12  . doxycycline (VIBRA-TABS) 100 MG tablet Take 1 tablet (100 mg total) by mouth 2 (two) times daily. 14 tablet 0  . FLUoxetine (PROZAC) 20 MG capsule Take 1 capsule (20 mg total) by mouth daily. 30 capsule 0  . gabapentin (NEURONTIN) 100 MG capsule Take 2 capsules (200 mg total) by mouth 3 (three) times daily. 180 capsule 0  . guaiFENesin (MUCINEX) 600 MG 12 hr tablet Take 1 tablet (600 mg total) by mouth 2 (two) times daily. 60 tablet 0  . hydrOXYzine (ATARAX/VISTARIL) 25 MG tablet Take 1 tablet (25 mg total) by mouth every 6 (six) hours as needed for anxiety. 30 tablet 0  . lamoTRIgine (LAMICTAL) 25 MG tablet Take 1 tablet (25 mg total) by mouth daily. 30 tablet 0  . loratadine (CLARITIN) 10 MG tablet Take 1 tablet (10 mg total) by mouth daily.    . metFORMIN (GLUCOPHAGE) 500 MG tablet Take 1 tablet (500 mg total) by mouth 2 (two) times daily with a meal. 60 tablet 1  . predniSONE (DELTASONE) 20 MG tablet Take 3 tablets by mouth daily X 2 days; then 2  tablets by mouth daily X 2 days; then 1 tablet by mouth daily X 2 days; then 1/2 tablet by mouth daily X 3 days and stop prednisone. 15 tablet 0  . tiotropium (SPIRIVA) 18 MCG inhalation capsule Place 1 capsule (18 mcg total) into inhaler and inhale daily. 30 capsule 12  . traZODone (DESYREL) 50 MG tablet Take 1 tablet (50 mg total) by mouth at bedtime and may repeat dose one time if needed. 60 tablet 0   No current facility-administered medications on file prior to visit.   Family History  Problem Relation Age of Onset  . Cancer Other   . Hypertension Mother   . Heart disease  Mother   . Cancer Mother   . Cancer Maternal Aunt   . Cancer Maternal Uncle     lung cancer   . Stroke Maternal Grandfather    History   Social History  . Marital Status: Single    Spouse Name: N/A    Number of Children: N/A  . Years of Education: N/A   Occupational History  . Not on file.   Social History Main Topics  . Smoking status: Former Smoker -- 0.33 packs/day for 18 years    Types: Cigarettes  . Smokeless tobacco: Never Used  . Alcohol Use: No  . Drug Use: No     Comment: history of coccaine   . Sexual Activity: Yes    Birth Control/ Protection: Surgical   Other Topics Concern  . Not on file   Social History Narrative    Review of Systems: Constitutional: Negative for fever, chills, diaphoresis, activity change, appetite change and fatigue. HENT: Negative for ear pain, nosebleeds, congestion, facial swelling, rhinorrhea, neck pain, neck stiffness and ear discharge.  Eyes: Negative for pain, discharge, redness, itching and visual disturbance. Respiratory: Negative for cough, choking, chest tightness, shortness of breath, wheezing and stridor.  Cardiovascular: Negative for chest pain, palpitations and leg swelling. Gastrointestinal: Negative for abdominal distention. Genitourinary: Negative for dysuria, urgency, frequency, hematuria, flank pain, decreased urine volume, difficulty urinating and dyspareunia.  Musculoskeletal: Negative for back pain, joint swelling, arthralgia and gait problem. Neurological: Negative for dizziness, tremors, seizures, syncope, facial asymmetry, speech difficulty, weakness, light-headedness, numbness and headaches.  Hematological: Negative for adenopathy. Does not bruise/bleed easily. Psychiatric/Behavioral: Negative for hallucinations, behavioral problems, confusion, dysphoric mood, decreased concentration and agitation.    Objective:   Filed Vitals:   08/30/14 1141  BP: 114/83  Pulse: 74  Temp: 98 F (36.7 C)  Resp: 16     Physical Exam: Constitutional: Patient appears well-developed and well-nourished. No distress. HENT: Normocephalic, atraumatic, External right and left ear normal. Oropharynx is clear and moist.  Eyes: Conjunctivae and EOM are normal. PERRLA, no scleral icterus. Neck: Normal ROM. Neck supple. No JVD. No tracheal deviation. No thyromegaly. CVS: RRR, S1/S2 +, no murmurs, no gallops, no carotid bruit.  Pulmonary: Effort and breath sounds normal, no stridor, rhonchi, wheezes, rales.  Abdominal: Soft. BS +, no distension, tenderness, rebound or guarding.  Musculoskeletal: Normal range of motion. No edema and no tenderness.  Neuro: Alert. Normal reflexes, muscle tone coordination. No cranial nerve deficit. Skin: Skin is warm and dry. No rash noted. Not diaphoretic. No erythema. No pallor. Psychiatric: Normal mood and affect. Behavior, judgment, thought content normal.  Lab Results  Component Value Date   WBC 8.0 08/18/2014   HGB 10.3* 08/18/2014   HCT 33.2* 08/18/2014   MCV 76.5* 08/18/2014   PLT 293 08/18/2014   Lab Results  Component Value Date   CREATININE 0.73 08/19/2014   BUN 14 08/19/2014   NA 140 08/19/2014   K 5.2 08/19/2014   CL 105 08/19/2014   CO2 22 08/19/2014    Lab Results  Component Value Date   HGBA1C 6.5* 08/19/2014   Lipid Panel  No results found for: CHOL, TRIG, HDL, CHOLHDL, VLDL, LDLCALC     Assessment and plan:   1. Other specified diabetes mellitus without complications Results for orders placed or performed in visit on 08/30/14  Glucose (CBG)  Result Value Ref Range   POC Glucose 137 (A) 70 - 99 mg/dl   Her last hemoglobin A1c was 6.5%, advised patient for diabetes meal planning continues metformin, repeat A1c in 3 months.  2. Preventative health care  - Tdap vaccine greater than or equal to 7yo IM  3. COPD exacerbation Currently patient is on Symbicort, albuterol, oxygen at home, patient already finish the course of antibiotic and  prednisone. - Ambulatory referral to Pulmonology  4. Mixed bipolar I disorder  - Ambulatory referral to Psychiatry  5. Screening  - Ambulatory referral to Gynecology      Health Maintenance  -Pap Smear: referred to GYN  -Vaccinations:  -TdAP  uptodate with flu shot   Return in about 3 months (around 11/29/2014).  Lorayne Marek, MD

## 2014-10-14 ENCOUNTER — Emergency Department: Payer: Self-pay | Admitting: Emergency Medicine

## 2014-10-14 LAB — COMPREHENSIVE METABOLIC PANEL
ALT: 16 U/L
ANION GAP: 8 (ref 7–16)
Albumin: 3.5 g/dL (ref 3.4–5.0)
Alkaline Phosphatase: 84 U/L
BUN: 8 mg/dL (ref 7–18)
Bilirubin,Total: 0.2 mg/dL (ref 0.2–1.0)
CHLORIDE: 110 mmol/L — AB (ref 98–107)
CREATININE: 0.75 mg/dL (ref 0.60–1.30)
Calcium, Total: 8.6 mg/dL (ref 8.5–10.1)
Co2: 23 mmol/L (ref 21–32)
Glucose: 113 mg/dL — ABNORMAL HIGH (ref 65–99)
Osmolality: 280 (ref 275–301)
POTASSIUM: 4 mmol/L (ref 3.5–5.1)
SGOT(AST): 18 U/L (ref 15–37)
Sodium: 141 mmol/L (ref 136–145)
Total Protein: 6.9 g/dL (ref 6.4–8.2)

## 2014-10-14 LAB — CBC
HCT: 36.6 % (ref 35.0–47.0)
HGB: 11.7 g/dL — ABNORMAL LOW (ref 12.0–16.0)
MCH: 24.4 pg — ABNORMAL LOW (ref 26.0–34.0)
MCHC: 31.9 g/dL — ABNORMAL LOW (ref 32.0–36.0)
MCV: 76 fL — ABNORMAL LOW (ref 80–100)
PLATELETS: 353 10*3/uL (ref 150–440)
RBC: 4.8 10*6/uL (ref 3.80–5.20)
RDW: 19 % — ABNORMAL HIGH (ref 11.5–14.5)
WBC: 10.4 10*3/uL (ref 3.6–11.0)

## 2014-10-14 LAB — APTT: ACTIVATED PTT: 29.2 s (ref 23.6–35.9)

## 2014-10-14 LAB — PROTIME-INR
INR: 1.1
Prothrombin Time: 13.6 secs (ref 11.5–14.7)

## 2014-11-29 ENCOUNTER — Encounter: Payer: Self-pay | Admitting: Family Medicine

## 2015-01-10 ENCOUNTER — Encounter (HOSPITAL_COMMUNITY): Payer: Self-pay | Admitting: *Deleted

## 2015-01-10 ENCOUNTER — Emergency Department (HOSPITAL_COMMUNITY)
Admission: EM | Admit: 2015-01-10 | Discharge: 2015-01-10 | Disposition: A | Discharge status: Home / Self Care | Payer: Self-pay | Attending: Emergency Medicine | Admitting: Emergency Medicine

## 2015-01-10 DIAGNOSIS — Z8719 Personal history of other diseases of the digestive system: Secondary | ICD-10-CM | POA: Insufficient documentation

## 2015-01-10 DIAGNOSIS — Z8701 Personal history of pneumonia (recurrent): Secondary | ICD-10-CM | POA: Insufficient documentation

## 2015-01-10 DIAGNOSIS — Z880 Allergy status to penicillin: Secondary | ICD-10-CM | POA: Insufficient documentation

## 2015-01-10 DIAGNOSIS — Z792 Long term (current) use of antibiotics: Secondary | ICD-10-CM | POA: Insufficient documentation

## 2015-01-10 DIAGNOSIS — Z862 Personal history of diseases of the blood and blood-forming organs and certain disorders involving the immune mechanism: Secondary | ICD-10-CM | POA: Insufficient documentation

## 2015-01-10 DIAGNOSIS — K002 Abnormalities of size and form of teeth: Secondary | ICD-10-CM | POA: Insufficient documentation

## 2015-01-10 DIAGNOSIS — F419 Anxiety disorder, unspecified: Secondary | ICD-10-CM | POA: Insufficient documentation

## 2015-01-10 DIAGNOSIS — K047 Periapical abscess without sinus: Secondary | ICD-10-CM | POA: Insufficient documentation

## 2015-01-10 DIAGNOSIS — Z79899 Other long term (current) drug therapy: Secondary | ICD-10-CM | POA: Insufficient documentation

## 2015-01-10 DIAGNOSIS — Z87891 Personal history of nicotine dependence: Secondary | ICD-10-CM | POA: Insufficient documentation

## 2015-01-10 DIAGNOSIS — K029 Dental caries, unspecified: Secondary | ICD-10-CM | POA: Insufficient documentation

## 2015-01-10 DIAGNOSIS — Z7951 Long term (current) use of inhaled steroids: Secondary | ICD-10-CM | POA: Insufficient documentation

## 2015-01-10 DIAGNOSIS — J449 Chronic obstructive pulmonary disease, unspecified: Secondary | ICD-10-CM | POA: Insufficient documentation

## 2015-01-10 DIAGNOSIS — E669 Obesity, unspecified: Secondary | ICD-10-CM | POA: Insufficient documentation

## 2015-01-10 DIAGNOSIS — E119 Type 2 diabetes mellitus without complications: Secondary | ICD-10-CM | POA: Insufficient documentation

## 2015-01-10 DIAGNOSIS — F319 Bipolar disorder, unspecified: Secondary | ICD-10-CM | POA: Insufficient documentation

## 2015-01-10 DIAGNOSIS — G43909 Migraine, unspecified, not intractable, without status migrainosus: Secondary | ICD-10-CM | POA: Insufficient documentation

## 2015-01-10 LAB — CBG MONITORING, ED: Glucose-Capillary: 141 mg/dL — ABNORMAL HIGH (ref 70–99)

## 2015-01-10 MED ORDER — TRAMADOL HCL 50 MG PO TABS: 50.0000 mg | ORAL_TABLET | Freq: Four times a day (QID) | ORAL | Status: DC | PRN

## 2015-01-10 MED ORDER — CLINDAMYCIN HCL 150 MG PO CAPS: 150.0000 mg | ORAL_CAPSULE | Freq: Four times a day (QID) | ORAL | Status: DC

## 2015-01-10 NOTE — ED Notes (Signed)
Dental pain , 2 teeth hurting lt mandibular .

## 2015-01-10 NOTE — ED Provider Notes (Signed)
CSN: [id number]     Arrival date & time 01/10/15  1452 History   First MD Initiated Contact with Patient 01/10/15 1620     Chief Complaint  Patient presents with  . Dental Pain     (Consider location/radiation/quality/duration/timing/severity/associated sxs/prior Treatment) The history is provided by the patient.   Shelby Hatfield is a 39 y.o. female presenting with a 1 week history of dental pain, gingival swelling and new dental fracture.   The patient has a history of injury and/or decay in the tooth involved which has recently started to cause increased  pain.  There has been no fevers, chills, nausea or vomiting, also no complaint of difficulty swallowing, although chewing makes pain worse.  The patient has tried tylenol without relief of symptoms. She also took left over keflex which has not been helpful.  She is in the process of having her teeth slowly extracted, has upper denture,  Has lower molars removed, pending premolars and incisors, but is limited by lack of dental insurance.      Past Medical History  Diagnosis Date  . Bipolar 1 disorder   . Migraines   . Wears dentures     upper denture  . Adenotonsillar hypertrophy 03/2012    snores during sleep; denies apnea; states occ. wakes up coughing  . COPD (chronic obstructive pulmonary disease)   . Anemia   . Depression   . Anxiety   . Obesity   . Hypercholesterolemia   . Pneumonia   . Pancreatitis   . Diabetes mellitus without complication    Past Surgical History  Procedure Laterality Date  . Cesarean section  08/31/2001    total of  3  . Appendectomy    . Cholecystectomy    . Tubal ligation  08/31/2001  . Dilation and evacuation  04/09/2000  . Tonsillectomy and adenoidectomy  04/12/2012    Procedure: TONSILLECTOMY AND ADENOIDECTOMY;  Surgeon: Ascencion Dike, MD;  Location: [facility];  Service: ENT;  Laterality: Bilateral;  . Tonsillectomy     Family History  Problem Relation Age of Onset  . Cancer  Other   . Hypertension Mother   . Heart disease Mother   . Cancer Mother   . Cancer Maternal Aunt   . Cancer Maternal Uncle     lung cancer   . Stroke Maternal Grandfather    History  Substance Use Topics  . Smoking status: Former Smoker -- 0.33 packs/day for 18 years    Types: Cigarettes  . Smokeless tobacco: Never Used  . Alcohol Use: No   OB History    Gravida Para Term Preterm AB TAB SAB Ectopic Multiple Living   _0 Review of Systems  Constitutional: Negative for fever.  HENT: Positive for dental problem. Negative for facial swelling and sore throat.   Respiratory: Negative for shortness of breath.   Musculoskeletal: Negative for neck pain and neck stiffness.      Allergies  Penicillins  Home Medications   Prior to Admission medications   Medication Sig Start Date End Date Taking? Authorizing Provider  Blood Glucose Monitoring Suppl (BLOOD GLUCOSE METER KIT AND SUPPLIES) Dispense based on patient and insurance preference. Use to check sugar two times a day (fasting in am and at bedtime). (FOR ICD-9 250.00, 250.01). Please provide the glucometer with cheapest strips 08/21/14   Barton Dubois, MD  budesonide-formoterol [facility]) 160-4.5 MCG/ACT inhaler Inhale 2 puffs into  the lungs 2 (two) times daily. 08/21/14   Barton Dubois, MD  clindamycin (CLEOCIN) 150 MG capsule Take 1 capsule (150 mg total) by mouth every 6 (six) hours. 01/10/15   Evalee Jefferson, PA-C  doxycycline (VIBRA-TABS) 100 MG tablet Take 1 tablet (100 mg total) by mouth 2 (two) times daily. 08/21/14   Barton Dubois, MD  FLUoxetine (PROZAC) 20 MG capsule Take 1 capsule (20 mg total) by mouth daily. 05/27/14   Niel Hummer, NP  gabapentin (NEURONTIN) 100 MG capsule Take 2 capsules (200 mg total) by mouth 3 (three) times daily. 05/27/14   Niel Hummer, NP  guaiFENesin (MUCINEX) 600 MG 12 hr tablet Take 1 tablet (600 mg total) by mouth 2 (two) times daily. 08/21/14   Barton Dubois, MD  hydrOXYzine  (ATARAX/VISTARIL) 25 MG tablet Take 1 tablet (25 mg total) by mouth every 6 (six) hours as needed for anxiety. 05/27/14   Niel Hummer, NP  lamoTRIgine (LAMICTAL) 25 MG tablet Take 1 tablet (25 mg total) by mouth daily. 05/27/14   Niel Hummer, NP  loratadine (CLARITIN) 10 MG tablet Take 1 tablet (10 mg total) by mouth daily. Patient not taking: Reported on 01/10/2015 08/21/14   Barton Dubois, MD  metFORMIN (GLUCOPHAGE) 500 MG tablet Take 1 tablet (500 mg total) by mouth 2 (two) times daily with a meal. Patient not taking: Reported on 01/10/2015 08/21/14   Barton Dubois, MD  predniSONE (DELTASONE) 20 MG tablet Take 3 tablets by mouth daily X 2 days; then 2 tablets by mouth daily X 2 days; then 1 tablet by mouth daily X 2 days; then 1/2 tablet by mouth daily X 3 days and stop prednisone. Patient not taking: Reported on 01/10/2015 08/21/14   Barton Dubois, MD  tiotropium [facility]) 18 MCG inhalation capsule Place 1 capsule (18 mcg total) into inhaler and inhale daily. Patient not taking: Reported on 01/10/2015 05/27/14   Niel Hummer, NP  traMADol (ULTRAM) 50 MG tablet Take 1 tablet (50 mg total) by mouth every 6 (six) hours as needed. 01/10/15   Evalee Jefferson, PA-C  traZODone (DESYREL) 50 MG tablet Take 1 tablet (50 mg total) by mouth at bedtime and may repeat dose one time if needed. Patient not taking: Reported on 01/10/2015 05/27/14   Niel Hummer, NP   BP 136/76 mmHg  Pulse 94  Temp(Src) 98.7 F (37.1 C) (Oral)  Resp 20  Ht _0  (1.549 m)  Wt 230 lb (104.327 kg)  BMI 43.48 kg/m2  SpO2 99%  LMP 12/27/2014 Physical Exam  Constitutional: She is oriented to person, place, and time. She appears well-developed and well-nourished. No distress.  HENT:  Head: Normocephalic and atraumatic.  Right Ear: Tympanic membrane and external ear normal.  Left Ear: Tympanic membrane and external ear normal.  Mouth/Throat: Oropharynx is clear and moist and mucous membranes are normal. No oral lesions. No trismus  in the jaw. Abnormal dentition. Dental abscesses and dental caries present.    Edentulous except for lower central, lateral incisors and premolars.  Moderate decay noted. New appearing fracture of the left lower lateral incisor.  Gingival edema without fluctuance or induration. Sublingual space is soft.  Eyes: Conjunctivae are normal.  Neck: Normal range of motion. Neck supple.  Cardiovascular: Normal rate and normal heart sounds.   Pulmonary/Chest: Effort normal.  Abdominal: She exhibits no distension.  Musculoskeletal: Normal range of motion.  Lymphadenopathy:    She has no cervical adenopathy.  Neurological: She is alert and oriented  to person, place, and time.  Skin: Skin is warm and dry. No erythema.  Psychiatric: She has a normal mood and affect.    ED Course  Procedures (including critical care time) Labs Review Labs Reviewed  CBG MONITORING, ED - Abnormal; Notable for the following:    Glucose-Capillary 141 (*)    All other components within normal limits    Imaging Review No results found.   EKG Interpretation None      MDM   Final diagnoses:  Dental abscess    Dental referrals given.  Clindamycin, tramadol.  Prn fu anticipated.    Evalee Jefferson, PA-C 01/10/15 [place], MD 01/14/15 [phone]

## 2015-01-10 NOTE — Discharge Instructions (Signed)
Dental Abscess A dental abscess is a collection of infected fluid (pus) from a bacterial infection in the inner part of the tooth (pulp). It usually occurs at the end of the tooth's root.  CAUSES   Severe tooth decay.  Trauma to the tooth that allows bacteria to enter into the pulp, such as a broken or chipped tooth. SYMPTOMS   Severe pain in and around the infected tooth.  Swelling and redness around the abscessed tooth or in the mouth or face.  Tenderness.  Pus drainage.  Bad breath.  Bitter taste in the mouth.  Difficulty swallowing.  Difficulty opening the mouth.  Nausea.  Vomiting.  Chills.  Swollen neck glands. DIAGNOSIS   A medical and dental history will be taken.  An examination will be performed by tapping on the abscessed tooth.  X-rays may be taken of the tooth to identify the abscess. TREATMENT The goal of treatment is to eliminate the infection. You may be prescribed antibiotic medicine to stop the infection from spreading. A root canal may be performed to save the tooth. If the tooth cannot be saved, it may be pulled (extracted) and the abscess may be drained.  HOME CARE INSTRUCTIONS  Only take over-the-counter or prescription medicines for pain, fever, or discomfort as directed by your caregiver.  Rinse your mouth (gargle) often with salt water ( tsp salt in 8 oz [250 ml] of warm water) to relieve pain or swelling.  Do not drive after taking pain medicine (narcotics).  Do not apply heat to the outside of your face.  Return to your dentist for further treatment as directed. SEEK MEDICAL CARE IF:  Your pain is not helped by medicine.  Your pain is getting worse instead of better. SEEK IMMEDIATE MEDICAL CARE IF:  You have a fever or persistent symptoms for more than 2-3 days.  You have a fever and your symptoms suddenly get worse.  You have chills or a very bad headache.  You have problems breathing or swallowing.  You have trouble  opening your mouth.  You have swelling in the neck or around the eye. Document Released: 09/14/2005 Document Revised: 06/08/2012 Document Reviewed: 12/23/2010 [facility] Patient Information 2015 [employer] [place]. This information is not intended to replace advice given to you by your health care provider. Make sure you discuss any questions you have with your health care provider.   Complete your entire course of antibiotics as prescribed.  You  may use the tramadol for pain relief but do not drive within 4 hours of taking as this will make you drowsy.  Avoid applying heat or ice to this abscess area which can worsen your symptoms.  You may use warm salt water swish and spit treatment or half peroxide and water swish and spit after meals to keep this area clean as discussed.  Call your dental clinic for further management of your symptoms.

## 2015-01-16 ENCOUNTER — Emergency Department: Admit: 2015-01-16 | Discharge status: Against Medical Advice | Payer: Self-pay | Admitting: Emergency Medicine

## 2015-01-16 LAB — URINALYSIS, COMPLETE
Bacteria: NONE SEEN
Bilirubin,UR: NEGATIVE
Blood: NEGATIVE
GLUCOSE, UR: NEGATIVE mg/dL (ref 0–75)
Ketone: NEGATIVE
LEUKOCYTE ESTERASE: NEGATIVE
NITRITE: NEGATIVE
Ph: 9 (ref 4.5–8.0)
Protein: NEGATIVE
RBC, UR: NONE SEEN /HPF (ref 0–5)
Specific Gravity: 1.006 (ref 1.003–1.030)

## 2015-01-16 LAB — BASIC METABOLIC PANEL
Anion Gap: 4 — ABNORMAL LOW (ref 7–16)
BUN: 5 mg/dL — ABNORMAL LOW
CHLORIDE: 105 mmol/L
CO2: 27 mmol/L
CREATININE: 0.67 mg/dL
Calcium, Total: 8.7 mg/dL — ABNORMAL LOW
EGFR (African American): >60
EGFR (Non-African Amer.): >60
Glucose: 109 mg/dL — ABNORMAL HIGH
POTASSIUM: 3.8 mmol/L
Sodium: 136 mmol/L

## 2015-01-16 LAB — CBC
HCT: 34.8 % — ABNORMAL LOW (ref 35.0–47.0)
HGB: 11.2 g/dL — AB (ref 12.0–16.0)
MCH: 24.1 pg — AB (ref 26.0–34.0)
MCHC: 32.1 g/dL (ref 32.0–36.0)
MCV: 75 fL — ABNORMAL LOW (ref 80–100)
PLATELETS: 317 10*3/uL (ref 150–440)
RBC: 4.64 10*6/uL (ref 3.80–5.20)
RDW: 17.7 % — AB (ref 11.5–14.5)
WBC: 10.8 10*3/uL (ref 3.6–11.0)

## 2015-01-16 LAB — TROPONIN I

## 2015-01-18 ENCOUNTER — Emergency Department (HOSPITAL_COMMUNITY)
Admission: EM | Admit: 2015-01-18 | Discharge: 2015-01-18 | Disposition: A | Discharge status: Home / Self Care | Payer: Self-pay | Attending: Emergency Medicine | Admitting: Emergency Medicine

## 2015-01-18 ENCOUNTER — Encounter (HOSPITAL_COMMUNITY): Payer: Self-pay | Admitting: Emergency Medicine

## 2015-01-18 ENCOUNTER — Emergency Department (HOSPITAL_COMMUNITY): Payer: Self-pay

## 2015-01-18 DIAGNOSIS — R0602 Shortness of breath: Secondary | ICD-10-CM | POA: Insufficient documentation

## 2015-01-18 DIAGNOSIS — E669 Obesity, unspecified: Secondary | ICD-10-CM | POA: Insufficient documentation

## 2015-01-18 DIAGNOSIS — E780 Pure hypercholesterolemia: Secondary | ICD-10-CM | POA: Insufficient documentation

## 2015-01-18 DIAGNOSIS — Z8701 Personal history of pneumonia (recurrent): Secondary | ICD-10-CM | POA: Insufficient documentation

## 2015-01-18 DIAGNOSIS — Z792 Long term (current) use of antibiotics: Secondary | ICD-10-CM | POA: Insufficient documentation

## 2015-01-18 DIAGNOSIS — Z862 Personal history of diseases of the blood and blood-forming organs and certain disorders involving the immune mechanism: Secondary | ICD-10-CM | POA: Insufficient documentation

## 2015-01-18 DIAGNOSIS — F319 Bipolar disorder, unspecified: Secondary | ICD-10-CM | POA: Insufficient documentation

## 2015-01-18 DIAGNOSIS — Z79899 Other long term (current) drug therapy: Secondary | ICD-10-CM | POA: Insufficient documentation

## 2015-01-18 DIAGNOSIS — R609 Edema, unspecified: Secondary | ICD-10-CM

## 2015-01-18 DIAGNOSIS — Z87891 Personal history of nicotine dependence: Secondary | ICD-10-CM | POA: Insufficient documentation

## 2015-01-18 DIAGNOSIS — J449 Chronic obstructive pulmonary disease, unspecified: Secondary | ICD-10-CM | POA: Insufficient documentation

## 2015-01-18 DIAGNOSIS — E119 Type 2 diabetes mellitus without complications: Secondary | ICD-10-CM | POA: Insufficient documentation

## 2015-01-18 DIAGNOSIS — G43909 Migraine, unspecified, not intractable, without status migrainosus: Secondary | ICD-10-CM | POA: Insufficient documentation

## 2015-01-18 DIAGNOSIS — Z880 Allergy status to penicillin: Secondary | ICD-10-CM | POA: Insufficient documentation

## 2015-01-18 DIAGNOSIS — R600 Localized edema: Secondary | ICD-10-CM | POA: Insufficient documentation

## 2015-01-18 DIAGNOSIS — F419 Anxiety disorder, unspecified: Secondary | ICD-10-CM | POA: Insufficient documentation

## 2015-01-18 LAB — COMPREHENSIVE METABOLIC PANEL
ALK PHOS: 87 U/L (ref 39–117)
ALT: 15 U/L (ref 0–35)
AST: 16 U/L (ref 0–37)
Albumin: 3.6 g/dL (ref 3.5–5.2)
Anion gap: 7 (ref 5–15)
BILIRUBIN TOTAL: 0.3 mg/dL (ref 0.3–1.2)
BUN: <5 mg/dL — ABNORMAL LOW (ref 6–23)
CHLORIDE: 107 mmol/L (ref 96–112)
CO2: 25 mmol/L (ref 19–32)
Calcium: 9 mg/dL (ref 8.4–10.5)
Creatinine, Ser: 0.71 mg/dL (ref 0.50–1.10)
GFR calc Af Amer: >90 mL/min (ref >90)
Glucose, Bld: 104 mg/dL — ABNORMAL HIGH (ref 70–99)
Potassium: 3.5 mmol/L (ref 3.5–5.1)
Sodium: 139 mmol/L (ref 135–145)
Total Protein: 6.4 g/dL (ref 6.0–8.3)

## 2015-01-18 LAB — CBC WITH DIFFERENTIAL/PLATELET
Basophils Absolute: 0 10*3/uL (ref 0.0–0.1)
Basophils Relative: 0 % (ref 0–1)
EOS ABS: 0.1 10*3/uL (ref 0.0–0.7)
EOS PCT: 1 % (ref 0–5)
HCT: 36.2 % (ref 36.0–46.0)
Hemoglobin: 11.6 g/dL — ABNORMAL LOW (ref 12.0–15.0)
Lymphocytes Relative: 27 % (ref 12–46)
Lymphs Abs: 3.4 10*3/uL (ref 0.7–4.0)
MCH: 24.3 pg — ABNORMAL LOW (ref 26.0–34.0)
MCHC: 32 g/dL (ref 30.0–36.0)
MCV: 75.7 fL — ABNORMAL LOW (ref 78.0–100.0)
Monocytes Absolute: 0.7 10*3/uL (ref 0.1–1.0)
Monocytes Relative: 6 % (ref 3–12)
Neutro Abs: 8.3 10*3/uL — ABNORMAL HIGH (ref 1.7–7.7)
Neutrophils Relative %: 66 % (ref 43–77)
PLATELETS: 333 10*3/uL (ref 150–400)
RBC: 4.78 MIL/uL (ref 3.87–5.11)
RDW: 16.5 % — AB (ref 11.5–15.5)
WBC: 12.5 10*3/uL — ABNORMAL HIGH (ref 4.0–10.5)

## 2015-01-18 LAB — URINALYSIS, ROUTINE W REFLEX MICROSCOPIC
BILIRUBIN URINE: NEGATIVE
Glucose, UA: NEGATIVE mg/dL
Hgb urine dipstick: NEGATIVE
KETONES UR: NEGATIVE mg/dL
LEUKOCYTES UA: NEGATIVE
NITRITE: NEGATIVE
PROTEIN: NEGATIVE mg/dL
Specific Gravity, Urine: 1.017 (ref 1.005–1.030)
UROBILINOGEN UA: 0.2 mg/dL (ref 0.0–1.0)
pH: 8 (ref 5.0–8.0)

## 2015-01-18 LAB — TROPONIN I: Troponin I: <0.03 ng/mL (ref <0.031)

## 2015-01-18 LAB — BRAIN NATRIURETIC PEPTIDE: B Natriuretic Peptide: 233.7 pg/mL — ABNORMAL HIGH (ref 0.0–100.0)

## 2015-01-18 MED ORDER — HYDROCODONE-ACETAMINOPHEN 5-325 MG PO TABS: 2.0000 | ORAL_TABLET | ORAL | Status: DC | PRN

## 2015-01-18 MED ORDER — ONDANSETRON HCL 4 MG/2ML IJ SOLN
4.0000 mg | Freq: Once | INTRAMUSCULAR | Status: AC
  Administered 2015-01-18: 4 mg via INTRAVENOUS
  Filled 2015-01-18: qty 2

## 2015-01-18 MED ORDER — POTASSIUM CHLORIDE CRYS ER 20 MEQ PO TBCR: 20.0000 meq | EXTENDED_RELEASE_TABLET | Freq: Every day | ORAL | Status: DC

## 2015-01-18 MED ORDER — FUROSEMIDE 20 MG PO TABS: 20.0000 mg | ORAL_TABLET | Freq: Every day | ORAL | Status: DC

## 2015-01-18 MED ORDER — MORPHINE SULFATE 4 MG/ML IJ SOLN
4.0000 mg | Freq: Once | INTRAMUSCULAR | Status: AC
  Administered 2015-01-18: 4 mg via INTRAVENOUS
  Filled 2015-01-18: qty 1

## 2015-01-18 MED ORDER — HYDROMORPHONE HCL 1 MG/ML IJ SOLN
1.0000 mg | Freq: Once | INTRAMUSCULAR | Status: AC
  Administered 2015-01-18: 1 mg via INTRAVENOUS
  Filled 2015-01-18: qty 1

## 2015-01-18 NOTE — ED Provider Notes (Signed)
CSN: [id number]     Arrival date & time 01/18/15  1139 History   First MD Initiated Contact with Patient 01/18/15 1332     Chief Complaint  Patient presents with  . Leg Swelling     (Consider location/radiation/quality/duration/timing/severity/associated sxs/prior Treatment) HPI Comments: Patient presents to the ER for evaluation of pain and swelling of both lower legs. Symptoms have been ongoing for several days. Patient reports pins and needles and sharp stabbing pains in the bottoms of her feet when she tries to walk and bear weight. She has noticed some pain in her lower back as well. No known injury. No chest pain. She does report some mild shortness of breath associated with the symptoms.   Past Medical History  Diagnosis Date  . Bipolar 1 disorder   . Migraines   . Wears dentures     upper denture  . Adenotonsillar hypertrophy 03/2012    snores during sleep; denies apnea; states occ. wakes up coughing  . COPD (chronic obstructive pulmonary disease)   . Anemia   . Depression   . Anxiety   . Obesity   . Hypercholesterolemia   . Pneumonia   . Pancreatitis   . Diabetes mellitus without complication    Past Surgical History  Procedure Laterality Date  . Cesarean section  08/31/2001    total of  3  . Appendectomy    . Cholecystectomy    . Tubal ligation  08/31/2001  . Dilation and evacuation  04/09/2000  . Tonsillectomy and adenoidectomy  04/12/2012    Procedure: TONSILLECTOMY AND ADENOIDECTOMY;  Surgeon: Ascencion Dike, MD;  Location: [place];  Service: ENT;  Laterality: Bilateral;  . Tonsillectomy     Family History  Problem Relation Age of Onset  . Cancer Other   . Hypertension Mother   . Heart disease Mother   . Cancer Mother   . Cancer Maternal Aunt   . Cancer Maternal Uncle     lung cancer   . Stroke Maternal Grandfather    History  Substance Use Topics  . Smoking status: Former Smoker -- 0.33 packs/day for 18 years    Types: Cigarettes  .  Smokeless tobacco: Never Used  . Alcohol Use: No   OB History    Gravida Para Term Preterm AB TAB SAB Ectopic Multiple Living   _0 Review of Systems  Cardiovascular: Positive for leg swelling.  All other systems reviewed and are negative.     Allergies  Penicillins  Home Medications   Prior to Admission medications   Medication Sig Start Date End Date Taking? Authorizing Provider  Blood Glucose Monitoring Suppl (BLOOD GLUCOSE METER KIT AND SUPPLIES) Dispense based on patient and insurance preference. Use to check sugar two times a day (fasting in am and at bedtime). (FOR ICD-9 250.00, 250.01). Please provide the glucometer with cheapest strips 08/21/14   Barton Dubois, MD  budesonide-formoterol [facility]) 160-4.5 MCG/ACT inhaler Inhale 2 puffs into the lungs 2 (two) times daily. 08/21/14   Barton Dubois, MD  clindamycin (CLEOCIN) 150 MG capsule Take 1 capsule (150 mg total) by mouth every 6 (six) hours. 01/10/15   Evalee Jefferson, PA-C  doxycycline (VIBRA-TABS) 100 MG tablet Take 1 tablet (100 mg total) by mouth 2 (two) times daily. 08/21/14   Barton Dubois, MD  FLUoxetine (PROZAC) 20 MG capsule Take 1 capsule (20 mg total) by mouth daily. 05/27/14   Hewitt Shorts  Davis, NP  furosemide (LASIX) 20 MG tablet Take 1 tablet (20 mg total) by mouth daily. 01/18/15    J , MD  gabapentin (NEURONTIN) 100 MG capsule Take 2 capsules (200 mg total) by mouth 3 (three) times daily. 05/27/14   Laura A Davis, NP  guaiFENesin (MUCINEX) 600 MG 12 hr tablet Take 1 tablet (600 mg total) by mouth 2 (two) times daily. 08/21/14   Carlos Madera, MD  HYDROcodone-acetaminophen (NORCO/VICODIN) 5-325 MG per tablet Take 2 tablets by mouth every 4 (four) hours as needed for moderate pain. 01/18/15    J , MD  hydrOXYzine (ATARAX/VISTARIL) 25 MG tablet Take 1 tablet (25 mg total) by mouth every 6 (six) hours as needed for anxiety. 05/27/14   Laura A Davis, NP  lamoTRIgine  (LAMICTAL) 25 MG tablet Take 1 tablet (25 mg total) by mouth daily. 05/27/14   Laura A Davis, NP  loratadine (CLARITIN) 10 MG tablet Take 1 tablet (10 mg total) by mouth daily. Patient not taking: Reported on 01/10/2015 08/21/14   Carlos Madera, MD  metFORMIN (GLUCOPHAGE) 500 MG tablet Take 1 tablet (500 mg total) by mouth 2 (two) times daily with a meal. Patient not taking: Reported on 01/10/2015 08/21/14   Carlos Madera, MD  potassium chloride SA (K-DUR,KLOR-CON) 20 MEQ tablet Take 1 tablet (20 mEq total) by mouth daily. 01/18/15    J , MD  predniSONE (DELTASONE) 20 MG tablet Take 3 tablets by mouth daily X 2 days; then 2 tablets by mouth daily X 2 days; then 1 tablet by mouth daily X 2 days; then 1/2 tablet by mouth daily X 3 days and stop prednisone. Patient not taking: Reported on 01/10/2015 08/21/14   Carlos Madera, MD  tiotropium (SPIRIVA) 18 MCG inhalation capsule Place 1 capsule (18 mcg total) into inhaler and inhale daily. Patient not taking: Reported on 01/10/2015 05/27/14   Laura A Davis, NP  traMADol (ULTRAM) 50 MG tablet Take 1 tablet (50 mg total) by mouth every 6 (six) hours as needed. 01/10/15   Julie Idol, PA-C  traZODone (DESYREL) 50 MG tablet Take 1 tablet (50 mg total) by mouth at bedtime and may repeat dose one time if needed. Patient not taking: Reported on 01/10/2015 05/27/14   Laura A Davis, NP   BP 133/83 mmHg  Pulse 72  Temp(Src) 98.7 F (37.1 C) (Oral)  Resp 16  Ht 5' 1" (1.549 m)  Wt 246 lb 1.6 oz (111.63 kg)  BMI 46.52 kg/m2  SpO2 98%  LMP 12/27/2014 Physical Exam  Constitutional: She is oriented to person, place, and time. She appears well-developed and well-nourished. No distress.  HENT:  Head: Normocephalic and atraumatic.  Right Ear: Hearing normal.  Left Ear: Hearing normal.  Nose: Nose normal.  Mouth/Throat: Oropharynx is clear and moist and mucous membranes are normal.  Eyes: Conjunctivae and EOM are normal. Pupils are equal, round, and  reactive to light.  Neck: Normal range of motion. Neck supple.  Cardiovascular: Regular rhythm, S1 normal and S2 normal.  Exam reveals no gallop and no friction rub.   No murmur heard. Pulmonary/Chest: Effort normal and breath sounds normal. No respiratory distress. She exhibits no tenderness.  Abdominal: Soft. Normal appearance and bowel sounds are normal. There is no hepatosplenomegaly. There is no tenderness. There is no rebound, no guarding, no tenderness at McBurney's point and negative Murphy's sign. No hernia.  Musculoskeletal: Normal range of motion. She exhibits edema (bilateral 1-2+ pitting).  Neurological: She is alert and oriented to   person, place, and time. She has normal strength. No cranial nerve deficit or sensory deficit. Coordination normal. GCS eye subscore is 4. GCS verbal subscore is 5. GCS motor subscore is 6.  Skin: Skin is warm, dry and intact. No rash noted. No cyanosis.  Psychiatric: She has a normal mood and affect. Her speech is normal and behavior is normal. Thought content normal.  Nursing note and vitals reviewed.   ED Course  Procedures (including critical care time) Labs Review Labs Reviewed  CBC WITH DIFFERENTIAL/PLATELET - Abnormal; Notable for the following:    WBC 12.5 (*)    Hemoglobin 11.6 (*)    MCV 75.7 (*)    MCH 24.3 (*)    RDW 16.5 (*)    Neutro Abs 8.3 (*)    All other components within normal limits  COMPREHENSIVE METABOLIC PANEL - Abnormal; Notable for the following:    Glucose, Bld 104 (*)    BUN <5 (*)    All other components within normal limits  BRAIN NATRIURETIC PEPTIDE - Abnormal; Notable for the following:    B Natriuretic Peptide 233.7 (*)    All other components within normal limits  URINALYSIS, ROUTINE W REFLEX MICROSCOPIC  TROPONIN I    Imaging Review Dg Chest 2 View  01/18/2015   CLINICAL DATA:  Shortness of breath and cough. RIGHT shoulder pain. Swollen legs for 3 days. History of COPD.  EXAM: CHEST  2 VIEW  COMPARISON:   08/17/2014.  FINDINGS: Low lung volumes persist. Splenic flexure colonic interposition over the spleen. Normal heart size and vascularity. No focal airspace consolidation. No osseous lesions. No pneumothorax.  IMPRESSION: No active cardiopulmonary disease.  Stable exam.   Electronically Signed   By: John  Curnes M.D.   On: 01/18/2015 15:34     EKG Interpretation   Date/Time:  Friday January 18 2015 13:57:16 EDT Ventricular Rate:  66 PR Interval:  112 QRS Duration: 93 QT Interval:  411 QTC Calculation: 431 R Axis:   65 Text Interpretation:  Sinus rhythm Borderline short PR interval Baseline  wander in lead(s) V2 No significant change since last tracing Confirmed by    MD,  ([id number]) on 01/18/2015 3:16:56 PM      MDM   Final diagnoses:  Shortness of breath  Peripheral edema    Patient presents to the ER for evaluation of lower extremity swelling. Patient does report some shortness of breath, but she does not have any wheezing, rales or hypoxia on examination. There is no chest pain. Chest x-ray is clear, no evidence of congestive heart failure. BNP is not significantly elevated. Doubt congestive heart failure at this time. Symptoms more peripheral in nature. No evidence of cellulitis or skin changes. Will treat with Lasix. Patient is to follow-up with her primary care doctor for further evaluation, consider outpatient echo. Return for symptoms worsening.     J , MD 01/18/15 1626 

## 2015-01-18 NOTE — Discharge Instructions (Signed)
Peripheral Edema °You have swelling in your legs (peripheral edema). This swelling is due to excess accumulation of salt and water in your body. Edema may be a sign of heart, kidney or liver disease, or a side effect of a medication. It may also be due to problems in the leg veins. Elevating your legs and using special support stockings may be very helpful, if the cause of the swelling is due to poor venous circulation. Avoid long periods of standing, whatever the cause. °Treatment of edema depends on identifying the cause. Chips, pretzels, pickles and other salty foods should be avoided. Restricting salt in your diet is almost always needed. Water pills (diuretics) are often used to remove the excess salt and water from your body via urine. These medicines prevent the kidney from reabsorbing sodium. This increases urine flow. °Diuretic treatment may also result in lowering of potassium levels in your body. Potassium supplements may be needed if you have to use diuretics daily. Daily weights can help you keep track of your progress in clearing your edema. You should call your caregiver for follow up care as recommended. °SEEK IMMEDIATE MEDICAL CARE IF:  °· You have increased swelling, pain, redness, or heat in your legs. °· You develop shortness of breath, especially when lying down. °· You develop chest or abdominal pain, weakness, or fainting. °· You have a fever. °Document Released: 10/22/2004 Document Revised: 12/07/2011 Document Reviewed: 10/02/2009 °ExitCare® Patient Information ©2015 [employer]. This information is not intended to replace advice given to you by your health care provider. Make sure you discuss any questions you have with your health care provider. ° °

## 2015-01-18 NOTE — ED Notes (Signed)
EKG given to Dr. Pollina 

## 2015-01-18 NOTE — ED Notes (Signed)
Pt here with leg swelling and lower back pain; pt sts swelling x weeks

## 2015-01-18 NOTE — ED Notes (Signed)
Pt tearfully asking for pain medication. Dr. Blinda LeatherwoodPollina informed.

## 2015-01-19 ENCOUNTER — Encounter (HOSPITAL_COMMUNITY): Payer: Self-pay

## 2015-01-19 ENCOUNTER — Inpatient Hospital Stay (HOSPITAL_COMMUNITY)
Admission: EM | Admit: 2015-01-19 | Discharge: 2015-01-22 | DRG: 292 | Disposition: A | Discharge status: Home / Self Care | Payer: Self-pay | Attending: Internal Medicine | Admitting: Internal Medicine

## 2015-01-19 ENCOUNTER — Emergency Department (HOSPITAL_COMMUNITY): Payer: Self-pay

## 2015-01-19 ENCOUNTER — Inpatient Hospital Stay (HOSPITAL_COMMUNITY): Payer: Self-pay

## 2015-01-19 DIAGNOSIS — Z87891 Personal history of nicotine dependence: Secondary | ICD-10-CM

## 2015-01-19 DIAGNOSIS — F419 Anxiety disorder, unspecified: Secondary | ICD-10-CM | POA: Diagnosis present

## 2015-01-19 DIAGNOSIS — E780 Pure hypercholesterolemia: Secondary | ICD-10-CM | POA: Diagnosis present

## 2015-01-19 DIAGNOSIS — E785 Hyperlipidemia, unspecified: Secondary | ICD-10-CM | POA: Diagnosis present

## 2015-01-19 DIAGNOSIS — T368X5A Adverse effect of other systemic antibiotics, initial encounter: Secondary | ICD-10-CM | POA: Diagnosis present

## 2015-01-19 DIAGNOSIS — R0602 Shortness of breath: Secondary | ICD-10-CM | POA: Diagnosis present

## 2015-01-19 DIAGNOSIS — D72829 Elevated white blood cell count, unspecified: Secondary | ICD-10-CM | POA: Diagnosis present

## 2015-01-19 DIAGNOSIS — Z6841 Body Mass Index (BMI) 40.0 and over, adult: Secondary | ICD-10-CM

## 2015-01-19 DIAGNOSIS — E669 Obesity, unspecified: Secondary | ICD-10-CM

## 2015-01-19 DIAGNOSIS — R112 Nausea with vomiting, unspecified: Secondary | ICD-10-CM

## 2015-01-19 DIAGNOSIS — R609 Edema, unspecified: Secondary | ICD-10-CM

## 2015-01-19 DIAGNOSIS — R197 Diarrhea, unspecified: Secondary | ICD-10-CM | POA: Diagnosis present

## 2015-01-19 DIAGNOSIS — Z801 Family history of malignant neoplasm of trachea, bronchus and lung: Secondary | ICD-10-CM

## 2015-01-19 DIAGNOSIS — F3160 Bipolar disorder, current episode mixed, unspecified: Secondary | ICD-10-CM | POA: Diagnosis present

## 2015-01-19 DIAGNOSIS — J449 Chronic obstructive pulmonary disease, unspecified: Secondary | ICD-10-CM | POA: Diagnosis present

## 2015-01-19 DIAGNOSIS — E119 Type 2 diabetes mellitus without complications: Secondary | ICD-10-CM | POA: Diagnosis present

## 2015-01-19 DIAGNOSIS — Z823 Family history of stroke: Secondary | ICD-10-CM

## 2015-01-19 DIAGNOSIS — E1169 Type 2 diabetes mellitus with other specified complication: Secondary | ICD-10-CM

## 2015-01-19 DIAGNOSIS — G8929 Other chronic pain: Secondary | ICD-10-CM | POA: Diagnosis present

## 2015-01-19 DIAGNOSIS — D509 Iron deficiency anemia, unspecified: Secondary | ICD-10-CM | POA: Diagnosis present

## 2015-01-19 DIAGNOSIS — K6389 Other specified diseases of intestine: Secondary | ICD-10-CM

## 2015-01-19 DIAGNOSIS — F1320 Sedative, hypnotic or anxiolytic dependence, uncomplicated: Secondary | ICD-10-CM | POA: Diagnosis present

## 2015-01-19 DIAGNOSIS — R600 Localized edema: Secondary | ICD-10-CM | POA: Diagnosis present

## 2015-01-19 DIAGNOSIS — J9611 Chronic respiratory failure with hypoxia: Secondary | ICD-10-CM | POA: Diagnosis present

## 2015-01-19 DIAGNOSIS — Z008 Encounter for other general examination: Secondary | ICD-10-CM

## 2015-01-19 DIAGNOSIS — Z8249 Family history of ischemic heart disease and other diseases of the circulatory system: Secondary | ICD-10-CM

## 2015-01-19 DIAGNOSIS — I5033 Acute on chronic diastolic (congestive) heart failure: Principal | ICD-10-CM

## 2015-01-19 DIAGNOSIS — K449 Diaphragmatic hernia without obstruction or gangrene: Secondary | ICD-10-CM

## 2015-01-19 DIAGNOSIS — J986 Disorders of diaphragm: Secondary | ICD-10-CM | POA: Diagnosis present

## 2015-01-19 DIAGNOSIS — F1120 Opioid dependence, uncomplicated: Secondary | ICD-10-CM | POA: Diagnosis present

## 2015-01-19 LAB — BASIC METABOLIC PANEL
Anion gap: 10 (ref 5–15)
BUN: <5 mg/dL — ABNORMAL LOW (ref 6–23)
CO2: 24 mmol/L (ref 19–32)
CREATININE: 0.91 mg/dL (ref 0.50–1.10)
Calcium: 8.9 mg/dL (ref 8.4–10.5)
Chloride: 105 mmol/L (ref 96–112)
GFR calc Af Amer: >90 mL/min (ref >90)
GFR calc non Af Amer: 78 mL/min — ABNORMAL LOW (ref >90)
Glucose, Bld: 136 mg/dL — ABNORMAL HIGH (ref 70–99)
POTASSIUM: 3.5 mmol/L (ref 3.5–5.1)
SODIUM: 139 mmol/L (ref 135–145)

## 2015-01-19 LAB — BRAIN NATRIURETIC PEPTIDE: B Natriuretic Peptide: 153 pg/mL — ABNORMAL HIGH (ref 0.0–100.0)

## 2015-01-19 LAB — CBC
HCT: 36.2 % (ref 36.0–46.0)
Hemoglobin: 11.3 g/dL — ABNORMAL LOW (ref 12.0–15.0)
MCH: 23.8 pg — AB (ref 26.0–34.0)
MCHC: 31.2 g/dL (ref 30.0–36.0)
MCV: 76.2 fL — ABNORMAL LOW (ref 78.0–100.0)
Platelets: 322 10*3/uL (ref 150–400)
RBC: 4.75 MIL/uL (ref 3.87–5.11)
RDW: 16.6 % — AB (ref 11.5–15.5)
WBC: 12 10*3/uL — ABNORMAL HIGH (ref 4.0–10.5)

## 2015-01-19 LAB — I-STAT BETA HCG BLOOD, ED (MC, WL, AP ONLY): I-stat hCG, quantitative: <5 m[IU]/mL (ref <5)

## 2015-01-19 LAB — D-DIMER, QUANTITATIVE: D-Dimer, Quant: 0.44 ug/mL-FEU (ref 0.00–0.48)

## 2015-01-19 LAB — I-STAT TROPONIN, ED: Troponin i, poc: 0 ng/mL (ref 0.00–0.08)

## 2015-01-19 MED ORDER — IPRATROPIUM-ALBUTEROL 0.5-2.5 (3) MG/3ML IN SOLN
3.0000 mL | Freq: Once | RESPIRATORY_TRACT | Status: AC
  Administered 2015-01-19: 3 mL via RESPIRATORY_TRACT
  Filled 2015-01-19: qty 3

## 2015-01-19 MED ORDER — ALBUTEROL SULFATE (2.5 MG/3ML) 0.083% IN NEBU: 2.5000 mg | INHALATION_SOLUTION | RESPIRATORY_TRACT | Status: DC | PRN

## 2015-01-19 MED ORDER — LAMOTRIGINE 25 MG PO TABS
25.0000 mg | ORAL_TABLET | Freq: Every day | ORAL | Status: DC
  Administered 2015-01-20 – 2015-01-22 (×3): 25 mg via ORAL
  Filled 2015-01-19 (×3): qty 1

## 2015-01-19 MED ORDER — HYDROXYZINE HCL 25 MG PO TABS
25.0000 mg | ORAL_TABLET | Freq: Four times a day (QID) | ORAL | Status: DC | PRN
  Filled 2015-01-19: qty 1

## 2015-01-19 MED ORDER — GABAPENTIN 100 MG PO CAPS
200.0000 mg | ORAL_CAPSULE | Freq: Three times a day (TID) | ORAL | Status: DC
  Administered 2015-01-20 (×2): 200 mg via ORAL
  Filled 2015-01-19 (×4): qty 2

## 2015-01-19 MED ORDER — ACETAMINOPHEN 325 MG PO TABS
650.0000 mg | ORAL_TABLET | Freq: Four times a day (QID) | ORAL | Status: DC | PRN
  Administered 2015-01-20: 650 mg via ORAL
  Filled 2015-01-19: qty 2

## 2015-01-19 MED ORDER — ONDANSETRON HCL 4 MG/2ML IJ SOLN: 4.0000 mg | Freq: Three times a day (TID) | INTRAMUSCULAR | Status: DC | PRN

## 2015-01-19 MED ORDER — FUROSEMIDE 10 MG/ML IJ SOLN
40.0000 mg | INTRAMUSCULAR | Status: AC
  Administered 2015-01-19: 40 mg via INTRAVENOUS
  Filled 2015-01-19: qty 4

## 2015-01-19 MED ORDER — FUROSEMIDE 10 MG/ML IJ SOLN
40.0000 mg | Freq: Every day | INTRAMUSCULAR | Status: DC
  Administered 2015-01-20 – 2015-01-21 (×2): 40 mg via INTRAVENOUS
  Filled 2015-01-19 (×2): qty 4

## 2015-01-19 MED ORDER — GUAIFENESIN ER 600 MG PO TB12
600.0000 mg | ORAL_TABLET | Freq: Two times a day (BID) | ORAL | Status: DC
  Administered 2015-01-20 – 2015-01-22 (×6): 600 mg via ORAL
  Filled 2015-01-19 (×7): qty 1

## 2015-01-19 MED ORDER — IPRATROPIUM-ALBUTEROL 0.5-2.5 (3) MG/3ML IN SOLN
3.0000 mL | Freq: Four times a day (QID) | RESPIRATORY_TRACT | Status: DC
Start: 2015-01-20 — End: 2015-01-20
  Filled 2015-01-19: qty 3

## 2015-01-19 MED ORDER — IOHEXOL 300 MG/ML  SOLN
100.0000 mL | Freq: Once | INTRAMUSCULAR | Status: AC | PRN
  Administered 2015-01-19: 100 mL via INTRAVENOUS

## 2015-01-19 MED ORDER — INSULIN ASPART 100 UNIT/ML ~~LOC~~ SOLN
0.0000 [IU] | Freq: Three times a day (TID) | SUBCUTANEOUS | Status: DC
  Administered 2015-01-20: 2 [IU] via SUBCUTANEOUS
  Administered 2015-01-21 – 2015-01-22 (×3): 1 [IU] via SUBCUTANEOUS

## 2015-01-19 MED ORDER — MORPHINE SULFATE 4 MG/ML IJ SOLN
4.0000 mg | Freq: Once | INTRAMUSCULAR | Status: AC
Start: 2015-01-19 — End: 2015-01-19
  Administered 2015-01-19: 4 mg via INTRAVENOUS
  Filled 2015-01-19: qty 1

## 2015-01-19 MED ORDER — MORPHINE SULFATE 4 MG/ML IJ SOLN
4.0000 mg | Freq: Once | INTRAMUSCULAR | Status: AC
  Administered 2015-01-19: 4 mg via INTRAVENOUS
  Filled 2015-01-19: qty 1

## 2015-01-19 MED ORDER — ASPIRIN EC 81 MG PO TBEC
81.0000 mg | DELAYED_RELEASE_TABLET | Freq: Every day | ORAL | Status: DC
  Administered 2015-01-20 – 2015-01-22 (×3): 81 mg via ORAL
  Filled 2015-01-19 (×3): qty 1

## 2015-01-19 MED ORDER — FLUOXETINE HCL 20 MG PO CAPS
20.0000 mg | ORAL_CAPSULE | Freq: Every day | ORAL | Status: DC
  Administered 2015-01-20 – 2015-01-22 (×3): 20 mg via ORAL
  Filled 2015-01-19 (×3): qty 1

## 2015-01-19 NOTE — ED Notes (Signed)
Attempted insertion of NG tube, was unsuccessful. Pt refusing another attempt.

## 2015-01-19 NOTE — ED Notes (Signed)
Pt was seen here yesterday for SOB, returns today for same and increase in her lower extremity swelling. Was sent home yesterday with pain meds, potassium pills, and lasix. Still feels SOB .

## 2015-01-19 NOTE — H&P (Signed)
[facility] History and Physical  Shelby Hatfield CLE:[id number] DOB: [birth year] DOA: 01/19/2015  Referring physician: ED physician PCP: Lorayne Marek, MD  Specialists:   Chief Complaint: SOB  HPI: Shelby Hatfield is a 39 y.o. female with past medical history of hyperlipidemia, diabetes mellitus, depression, anxiety, bipolar disorder, migraine headaches, COPD, history of pancreatitis, opioid dependence, benzo dependence, who presents with shortness of breath.  Patient reports that he has been having shortness of breath in the past 4 days, which has been progressively getting worse. She also has bilateral leg edema, which she noticed 5 days ago. She has dry cough and wheezing, but no chest pain. She was evaluated in emergency room yesterday, and was given prescription of Lasix. She has been taking Lasix without any improvement of her shortness of breath.   Patient also reports having diarrhea in the past 5 days. He had 5-6 bowel movements with loose stool, but no blood in the stools. It is associated with nausea, but not vomiting. She does not have abdominal pain, but has pain crossing whole lower back. She also has right shoulder pain. She reports that she completed a course of clindamycin for dental pain yesterday.   ROS: currently patient denies fever, chills, running nose, ear pain, headaches, abdominal pain, constipation, dysuria, urgency, frequency, hematuria, skin rashes. No unilateral weakness, numbness or tingling sensations. No vision change or hearing loss.  In ED, patient was found to have a negative d-dimer, negative urinalysis (on 01/18/15), troponin negative, BNP 153, WBC 12.0, temperature normal, tachypnea, electrolytes okay. Chest x-ray showed considerable worsening of gaseous distention in the LEFT upper quadrant with marked elevation LEFT hemidiaphragm and hiatal hernia. Patient is admitted to inpatient for further evaluation and treatment.   Review of Systems: As presented  in the history of presenting illness, rest negative.  Where does patient live? At home Can patient participate in ADLs? Yes  Allergy:  Allergies  Allergen Reactions  . Penicillins Itching and Rash    Past Medical History  Diagnosis Date  . Bipolar 1 disorder   . Migraines   . Wears dentures     upper denture  . Adenotonsillar hypertrophy 03/2012    snores during sleep; denies apnea; states occ. wakes up coughing  . COPD (chronic obstructive pulmonary disease)   . Anemia   . Depression   . Anxiety   . Obesity   . Hypercholesterolemia   . Pneumonia   . Pancreatitis   . Diabetes mellitus without complication     Past Surgical History  Procedure Laterality Date  . Cesarean section  08/31/2001    total of  3  . Appendectomy    . Cholecystectomy    . Tubal ligation  08/31/2001  . Dilation and evacuation  04/09/2000  . Tonsillectomy and adenoidectomy  04/12/2012    Procedure: TONSILLECTOMY AND ADENOIDECTOMY;  Surgeon: Ascencion Dike, MD;  Location: [place];  Service: ENT;  Laterality: Bilateral;  . Tonsillectomy      Social History:  reports that she has quit smoking. Her smoking use included Cigarettes. She has a 5.94 pack-year smoking history. She has never used smokeless tobacco. She reports that she does not drink alcohol or use illicit drugs.  Family History:  Family History  Problem Relation Age of Onset  . Cancer Other   . Hypertension Mother   . Heart disease Mother   . Cancer Mother   . Cancer Maternal Aunt   . Cancer Maternal Uncle  lung cancer   . Stroke Maternal Grandfather      Prior to Admission medications   Medication Sig Start Date End Date Taking? Authorizing Provider  budesonide-formoterol (SYMBICORT) 160-4.5 MCG/ACT inhaler Inhale 2 puffs into the lungs 2 (two) times daily. 08/21/14  Yes Barton Dubois, MD  FLUoxetine (PROZAC) 20 MG capsule Take 1 capsule (20 mg total) by mouth daily. 05/27/14  Yes Niel Hummer, NP  furosemide  (LASIX) 20 MG tablet Take 1 tablet (20 mg total) by mouth daily. 01/18/15  Yes Orpah Greek, MD  gabapentin (NEURONTIN) 100 MG capsule Take 2 capsules (200 mg total) by mouth 3 (three) times daily. 05/27/14  Yes Niel Hummer, NP  guaiFENesin (MUCINEX) 600 MG 12 hr tablet Take 1 tablet (600 mg total) by mouth 2 (two) times daily. 08/21/14  Yes Barton Dubois, MD  HYDROcodone-acetaminophen (NORCO/VICODIN) 5-325 MG per tablet Take 2 tablets by mouth every 4 (four) hours as needed for moderate pain. 01/18/15  Yes Orpah Greek, MD  hydrOXYzine (ATARAX/VISTARIL) 25 MG tablet Take 1 tablet (25 mg total) by mouth every 6 (six) hours as needed for anxiety. 05/27/14  Yes Niel Hummer, NP  lamoTRIgine (LAMICTAL) 25 MG tablet Take 1 tablet (25 mg total) by mouth daily. 05/27/14  Yes Niel Hummer, NP  potassium chloride SA (K-DUR,KLOR-CON) 20 MEQ tablet Take 1 tablet (20 mEq total) by mouth daily. 01/18/15  Yes Orpah Greek, MD  Blood Glucose Monitoring Suppl (BLOOD GLUCOSE METER KIT AND SUPPLIES) Dispense based on patient and insurance preference. Use to check sugar two times a day (fasting in am and at bedtime). (FOR ICD-9 250.00, 250.01). Please provide the glucometer with cheapest strips 08/21/14   Barton Dubois, MD  clindamycin (CLEOCIN) 150 MG capsule Take 1 capsule (150 mg total) by mouth every 6 (six) hours. Patient not taking: Reported on 01/19/2015 01/10/15   Evalee Jefferson, PA-C  doxycycline (VIBRA-TABS) 100 MG tablet Take 1 tablet (100 mg total) by mouth 2 (two) times daily. 08/21/14   Barton Dubois, MD  loratadine (CLARITIN) 10 MG tablet Take 1 tablet (10 mg total) by mouth daily. Patient not taking: Reported on 01/10/2015 08/21/14   Barton Dubois, MD  metFORMIN (GLUCOPHAGE) 500 MG tablet Take 1 tablet (500 mg total) by mouth 2 (two) times daily with a meal. Patient not taking: Reported on 01/10/2015 08/21/14   Barton Dubois, MD  predniSONE (DELTASONE) 20 MG tablet Take 3 tablets by  mouth daily X 2 days; then 2 tablets by mouth daily X 2 days; then 1 tablet by mouth daily X 2 days; then 1/2 tablet by mouth daily X 3 days and stop prednisone. Patient not taking: Reported on 01/10/2015 08/21/14   Barton Dubois, MD  tiotropium [facility]) 18 MCG inhalation capsule Place 1 capsule (18 mcg total) into inhaler and inhale daily. Patient not taking: Reported on 01/10/2015 05/27/14   Niel Hummer, NP  traMADol (ULTRAM) 50 MG tablet Take 1 tablet (50 mg total) by mouth every 6 (six) hours as needed. 01/10/15   Evalee Jefferson, PA-C  traZODone (DESYREL) 50 MG tablet Take 1 tablet (50 mg total) by mouth at bedtime and may repeat dose one time if needed. Patient not taking: Reported on 01/10/2015 05/27/14   Niel Hummer, NP    Physical Exam: Filed Vitals:   01/19/15 1919 01/19/15 1930 01/19/15 2144 01/19/15 2206  BP: 125/66 127/67 145/87 116/68  Pulse: 84   81  Temp:  TempSrc:      Resp: $Remo'20 30 23 22  'wJkMN$ Height:      Weight:      SpO2: 97% 96% 100% 98%   General: Not in acute distress HEENT:       Eyes: PERRL, EOMI, no scleral icterus       ENT: No discharge from the ears and nose, no pharynx injection, no tonsillar enlargement.        Neck: difficult to assess JVD due to obesity, no bruit, no mass felt. Cardiac: S1/S2, RRR, No murmurs, No gallops or rubs Pulm: Decreased air movement on the left. Has mild wheezing bilaterally, but no rales or rubs. Abd: Soft, nondistended, nontender, no rebound pain, no organomegaly, BS present Ext: 2+ pitting leg edema bilaterally. 2+DP/PT pulse bilaterally Musculoskeletal: No joint deformities. Mild tenderness in the whole lower back.  Skin: No rashes.  Neuro: Alert and oriented X3, cranial nerves II-XII grossly intact, muscle strength 5/5 in all extremeties, sensation to light touch intact.  Psych: Patient is not psychotic, no suicidal or hemocidal ideation.  Labs on Admission:  Basic Metabolic Panel:  Recent Labs Lab 01/18/15 1351  01/19/15 1758  NA 139 139  K 3.5 3.5  CL 107 105  CO2 25 24  GLUCOSE 104* 136*  BUN <5* <5*  CREATININE 0.71 0.91  CALCIUM 9.0 8.9   Liver Function Tests:  Recent Labs Lab 01/18/15 1351  AST 16  ALT 15  ALKPHOS 87  BILITOT 0.3  PROT 6.4  ALBUMIN 3.6   No results for input(s): LIPASE, AMYLASE in the last 168 hours. No results for input(s): AMMONIA in the last 168 hours. CBC:  Recent Labs Lab 01/18/15 1351 01/19/15 1758  WBC 12.5* 12.0*  NEUTROABS 8.3*  --   HGB 11.6* 11.3*  HCT 36.2 36.2  MCV 75.7* 76.2*  PLT 333 322   Cardiac Enzymes:  Recent Labs Lab 01/18/15 1351  TROPONINI <0.03    BNP (last 3 results)  Recent Labs  01/18/15 1351 01/19/15 1758  BNP 233.7* 153.0*    ProBNP (last 3 results)  Recent Labs  02/10/14 0020 08/17/14 2302  PROBNP 124.8 60.5    CBG: No results for input(s): GLUCAP in the last 168 hours.  Radiological Exams on Admission: Dg Chest 2 View  01/19/2015   CLINICAL DATA:  Shortness breath leg swelling for 4 days.  EXAM: CHEST  2 VIEW  COMPARISON:  CT chest 11/28/2013.  Chest radiograph 01/18/2015.  FINDINGS: Normal cardiomediastinal silhouette except for slight left-to-right displacement. Considerable worsening of the gaseous distention under the LEFT hemidiaphragm with marked elevation, representing colonic interposition. Hiatal hernia with air-fluid level also noted. Compressive atelectasis LEFT base. No active infiltrates or failure.  IMPRESSION: Considerable worsening of gaseous distention in the LEFT upper quadrant with marked elevation LEFT hemidiaphragm. Compressive atelectasis LEFT base. Hiatal hernia.   Electronically Signed   By: Rolla Flatten M.D.   On: 01/19/2015 19:29   Dg Chest 2 View  01/18/2015   CLINICAL DATA:  Shortness of breath and cough. RIGHT shoulder pain. Swollen legs for 3 days. History of COPD.  EXAM: CHEST  2 VIEW  COMPARISON:  08/17/2014.  FINDINGS: Low lung volumes persist. Splenic flexure colonic  interposition over the spleen. Normal heart size and vascularity. No focal airspace consolidation. No osseous lesions. No pneumothorax.  IMPRESSION: No active cardiopulmonary disease.  Stable exam.   Electronically Signed   By: Rolla Flatten M.D.   On: 01/18/2015 15:34    EKG: Independently  reviewed. No ischemic change.  Assessment/Plan Principal Problem:   Shortness of breath Active Problems:   Mixed bipolar I disorder   Opiate dependence, continuous   Benzodiazepine dependence   COPD (chronic obstructive pulmonary disease)   Type 2 diabetes mellitus without complication   Elevated diaphragm   SOB (shortness of breath)   Lower leg edema  Shortness of breath: Likely multifactorial, including COPD, possible CHF given bilateral leg edema and elevation of left diaphragm. D-dimer is negative ruling out PE. No pneumonia on chest x-ray.  -will admit to tele bed -put NG Tub per surgeon's -treat COPD and possible CHF as below  Elevated diaphragm: as evidenced by CXR. EDP discussed with surgeon, no urgent surgical issue. Etiology is not clear. -Get CT-abd/pelvis/Chest -NG tube  Diarrhea: Given recent antibiotics use, need to rule out C. difficile. -C. difficile PCR and GI pathogen panel  CODP: Patient does not have productive cough or chest pain. She has very mild wheezing on auscultation. No obvious acute exacerbation. -Nebulizers: scheduled Duoneb and prn albuterol -Mucinex for cough   Leg edema: Etiology is not clear. Urinalysis has no protein, less likely to have renal etiology. Patient may have undiagnosed congestive heart failure, though BNP is 153, it can be falsely low due to obesity. -2d echo -Lasix 40 mg daily -ASA -check LFT to r/o hepatic etiology  Diabetes mellitus: A1c was 6.511/25/15. Well controlled. Patient is supposed to take her metformin, but has not been taking currently. -Sliding-scale insulin  Psych issue: Including depression, anxiety, bipolar disorder. It is  stable. No suicidal or homicidal ideations. -Continue Lamictal, Prozac   DVT ppx: SQ Heparin    Code Status: Full code Family Communication:  Yes, patient's boyfriend and daughter   at bed side Disposition Plan: Admit to inpatient   Date of Service 01/19/2015    Ivor Costa [facility] Pager [phone]  If 7PM-7AM, please contact night-coverage [email or web address] Password [facility] 01/19/2015, 10:27 PM

## 2015-01-19 NOTE — ED Provider Notes (Signed)
CSN: [id number]     Arrival date & time 01/19/15  27 History   First MD Initiated Contact with Patient 01/19/15 1903     Chief Complaint  Patient presents with  . Leg Swelling  . Shortness of Breath     (Consider location/radiation/quality/duration/timing/severity/associated sxs/prior Treatment) HPI Comments: Patient with history of bipolar, COPD, and diabetes presents emergency department with chief complaint of shortness breath and lower extremity swelling. States the symptoms have been ongoing for the past 4 days. States that she was seen yesterday for the same. She was prescribed Lasix and potassium pills. States she has been taking the medications as directed, but without any relief. States that she has had some increased shortness of breath as well as lower extremity swelling today. States that the swelling in her legs causes her legs very painful. She denies any recent surgeries  The history is provided by the patient. No language interpreter was used.    Past Medical History  Diagnosis Date  . Bipolar 1 disorder   . Migraines   . Wears dentures     upper denture  . Adenotonsillar hypertrophy 03/2012    snores during sleep; denies apnea; states occ. wakes up coughing  . COPD (chronic obstructive pulmonary disease)   . Anemia   . Depression   . Anxiety   . Obesity   . Hypercholesterolemia   . Pneumonia   . Pancreatitis   . Diabetes mellitus without complication    Past Surgical History  Procedure Laterality Date  . Cesarean section  08/31/2001    total of  3  . Appendectomy    . Cholecystectomy    . Tubal ligation  08/31/2001  . Dilation and evacuation  04/09/2000  . Tonsillectomy and adenoidectomy  04/12/2012    Procedure: TONSILLECTOMY AND ADENOIDECTOMY;  Surgeon: Ascencion Dike, MD;  Location: [facility];  Service: ENT;  Laterality: Bilateral;  . Tonsillectomy     Family History  Problem Relation Age of Onset  . Cancer Other   . Hypertension Mother    . Heart disease Mother   . Cancer Mother   . Cancer Maternal Aunt   . Cancer Maternal Uncle     lung cancer   . Stroke Maternal Grandfather    History  Substance Use Topics  . Smoking status: Former Smoker -- 0.33 packs/day for 18 years    Types: Cigarettes  . Smokeless tobacco: Never Used  . Alcohol Use: No   OB History    Gravida Para Term Preterm AB TAB SAB Ectopic Multiple Living   _0 Review of Systems  Constitutional: Negative for fever and chills.  Respiratory: Positive for shortness of breath.   Cardiovascular: Negative for chest pain.  Gastrointestinal: Negative for nausea, vomiting, diarrhea and constipation.  Genitourinary: Negative for dysuria.  Musculoskeletal: Positive for back pain.  All other systems reviewed and are negative.     Allergies  Penicillins  Home Medications   Prior to Admission medications   Medication Sig Start Date End Date Taking? Authorizing Provider  Blood Glucose Monitoring Suppl (BLOOD GLUCOSE METER KIT AND SUPPLIES) Dispense based on patient and insurance preference. Use to check sugar two times a day (fasting in am and at bedtime). (FOR ICD-9 250.00, 250.01). Please provide the glucometer with cheapest strips 08/21/14   Barton Dubois, MD  budesonide-formoterol [facility]) 160-4.5 MCG/ACT inhaler Inhale 2 puffs into the lungs 2 (two) times  daily. 08/21/14   Barton Dubois, MD  clindamycin (CLEOCIN) 150 MG capsule Take 1 capsule (150 mg total) by mouth every 6 (six) hours. 01/10/15   Evalee Jefferson, PA-C  doxycycline (VIBRA-TABS) 100 MG tablet Take 1 tablet (100 mg total) by mouth 2 (two) times daily. 08/21/14   Barton Dubois, MD  FLUoxetine (PROZAC) 20 MG capsule Take 1 capsule (20 mg total) by mouth daily. 05/27/14   Niel Hummer, NP  furosemide (LASIX) 20 MG tablet Take 1 tablet (20 mg total) by mouth daily. 01/18/15   Orpah Greek, MD  gabapentin (NEURONTIN) 100 MG capsule Take 2 capsules (200 mg total) by mouth  3 (three) times daily. 05/27/14   Niel Hummer, NP  guaiFENesin (MUCINEX) 600 MG 12 hr tablet Take 1 tablet (600 mg total) by mouth 2 (two) times daily. 08/21/14   Barton Dubois, MD  HYDROcodone-acetaminophen (NORCO/VICODIN) 5-325 MG per tablet Take 2 tablets by mouth every 4 (four) hours as needed for moderate pain. 01/18/15   Orpah Greek, MD  hydrOXYzine (ATARAX/VISTARIL) 25 MG tablet Take 1 tablet (25 mg total) by mouth every 6 (six) hours as needed for anxiety. 05/27/14   Niel Hummer, NP  lamoTRIgine (LAMICTAL) 25 MG tablet Take 1 tablet (25 mg total) by mouth daily. 05/27/14   Niel Hummer, NP  loratadine (CLARITIN) 10 MG tablet Take 1 tablet (10 mg total) by mouth daily. Patient not taking: Reported on 01/10/2015 08/21/14   Barton Dubois, MD  metFORMIN (GLUCOPHAGE) 500 MG tablet Take 1 tablet (500 mg total) by mouth 2 (two) times daily with a meal. Patient not taking: Reported on 01/10/2015 08/21/14   Barton Dubois, MD  potassium chloride SA (K-DUR,KLOR-CON) 20 MEQ tablet Take 1 tablet (20 mEq total) by mouth daily. 01/18/15   Orpah Greek, MD  predniSONE (DELTASONE) 20 MG tablet Take 3 tablets by mouth daily X 2 days; then 2 tablets by mouth daily X 2 days; then 1 tablet by mouth daily X 2 days; then 1/2 tablet by mouth daily X 3 days and stop prednisone. Patient not taking: Reported on 01/10/2015 08/21/14   Barton Dubois, MD  tiotropium [facility]) 18 MCG inhalation capsule Place 1 capsule (18 mcg total) into inhaler and inhale daily. Patient not taking: Reported on 01/10/2015 05/27/14   Niel Hummer, NP  traMADol (ULTRAM) 50 MG tablet Take 1 tablet (50 mg total) by mouth every 6 (six) hours as needed. 01/10/15   Evalee Jefferson, PA-C  traZODone (DESYREL) 50 MG tablet Take 1 tablet (50 mg total) by mouth at bedtime and may repeat dose one time if needed. Patient not taking: Reported on 01/10/2015 05/27/14   Niel Hummer, NP   BP 125/66 mmHg  Pulse 84  Temp(Src) 98.7 F (37.1 C)  (Oral)  Resp 20  Ht _0  (1.575 m)  Wt 246 lb (111.585 kg)  BMI 44.98 kg/m2  SpO2 97%  LMP 12/27/2014 Physical Exam  Constitutional: She is oriented to person, place, and time. She appears well-developed and well-nourished.  HENT:  Head: Normocephalic and atraumatic.  Eyes: Conjunctivae and EOM are normal. Pupils are equal, round, and reactive to light.  Neck: Normal range of motion. Neck supple.  Cardiovascular: Normal rate and regular rhythm.  Exam reveals no gallop and no friction rub.   No murmur heard. Pulmonary/Chest: Effort normal and breath sounds normal. No respiratory distress. She has no wheezes. She has no rales. She exhibits no tenderness.  Lungs are clear  to auscultation  Abdominal: Soft. Bowel sounds are normal. She exhibits no distension and no mass. There is no tenderness. There is no rebound and no guarding.  Musculoskeletal: Normal range of motion. She exhibits edema. She exhibits no tenderness.  2+ pitting edema bilateral lower extremities  Neurological: She is alert and oriented to person, place, and time.  Skin: Skin is warm and dry.  Psychiatric: She has a normal mood and affect. Her behavior is normal. Judgment and thought content normal.  Nursing note and vitals reviewed.   ED Course  Procedures (including critical care time) Labs Review Labs Reviewed  CBC - Abnormal; Notable for the following:    WBC 12.0 (*)    Hemoglobin 11.3 (*)    MCV 76.2 (*)    MCH 23.8 (*)    RDW 16.6 (*)    All other components within normal limits  BASIC METABOLIC PANEL - Abnormal; Notable for the following:    Glucose, Bld 136 (*)    BUN <5 (*)    GFR calc non Af Amer 78 (*)    All other components within normal limits  BRAIN NATRIURETIC PEPTIDE - Abnormal; Notable for the following:    B Natriuretic Peptide 153.0 (*)    All other components within normal limits  I-STAT TROPOININ, ED    Imaging Review Dg Chest 2 View  01/18/2015   CLINICAL DATA:  Shortness of  breath and cough. RIGHT shoulder pain. Swollen legs for 3 days. History of COPD.  EXAM: CHEST  2 VIEW  COMPARISON:  08/17/2014.  FINDINGS: Low lung volumes persist. Splenic flexure colonic interposition over the spleen. Normal heart size and vascularity. No focal airspace consolidation. No osseous lesions. No pneumothorax.  IMPRESSION: No active cardiopulmonary disease.  Stable exam.   Electronically Signed   By: Rolla Flatten M.D.   On: 01/18/2015 15:34     EKG Interpretation None      MDM   Final diagnoses:  Encounter for medical assessment  SOB (shortness of breath)   Patient with COPD history. Has 3+ pitting edema in lower extremities. Also has some shortness breath. Chest x-ray reviewed with Dr. Maryan Rued. Patient does not have any abdominal pain. Will give some Lasix in a breathing treatment. Will reassess.  Patient still complaining of shortness of breath. Reports some improvement after breathing treatment. Lungs remain clear.  Patient reassessed having some back pain. Discussed with Dr. Maryan Rued, who recommends d-dimer.  D-dimer is negative, patient still feeling short of breath. Symptoms still persist.   10:01 PM Patient discussed with Dr. Blaine Hamper, who request that I call general surgery.  Recommends CT chest/abd/pel.    10:03 PM Discussed patient with Dr. Hulen Skains from surgery, who advises placing an NG tube, but states this is not a surgical emergency unless there is free air. Relayed this information to Dr. Blaine Hamper.   Montine Circle, PA-C 01/19/15 [id number]  Blanchie Dessert, MD 01/20/15 [phone]

## 2015-01-20 ENCOUNTER — Encounter (HOSPITAL_COMMUNITY): Payer: Self-pay | Admitting: *Deleted

## 2015-01-20 DIAGNOSIS — R112 Nausea with vomiting, unspecified: Secondary | ICD-10-CM

## 2015-01-20 LAB — OCCULT BLOOD X 1 CARD TO LAB, STOOL: FECAL OCCULT BLD: NEGATIVE

## 2015-01-20 LAB — GLUCOSE, CAPILLARY
GLUCOSE-CAPILLARY: 125 mg/dL — AB (ref 70–99)
Glucose-Capillary: 98 mg/dL (ref 70–99)
Glucose-Capillary: 103 mg/dL — ABNORMAL HIGH (ref 70–99)
Glucose-Capillary: 166 mg/dL — ABNORMAL HIGH (ref 70–99)

## 2015-01-20 LAB — RAPID URINE DRUG SCREEN, HOSP PERFORMED
Amphetamines: NOT DETECTED
BENZODIAZEPINES: NOT DETECTED
Barbiturates: POSITIVE — AB
Cocaine: NOT DETECTED
OPIATES: POSITIVE — AB
Tetrahydrocannabinol: NOT DETECTED

## 2015-01-20 LAB — PROTIME-INR
INR: 0.99 (ref 0.00–1.49)
Prothrombin Time: 13.2 seconds (ref 11.6–15.2)

## 2015-01-20 LAB — LIPASE, BLOOD: LIPASE: 27 U/L (ref 11–59)

## 2015-01-20 LAB — HEPATIC FUNCTION PANEL
ALBUMIN: 3.8 g/dL (ref 3.5–5.2)
ALT: 11 U/L (ref 0–35)
AST: 15 U/L (ref 0–37)
Alkaline Phosphatase: 86 U/L (ref 39–117)
Bilirubin, Direct: <0.1 mg/dL (ref 0.0–0.5)
Total Bilirubin: 0.6 mg/dL (ref 0.3–1.2)
Total Protein: 6.9 g/dL (ref 6.0–8.3)

## 2015-01-20 LAB — HIV ANTIBODY (ROUTINE TESTING W REFLEX): HIV SCREEN 4TH GENERATION: NONREACTIVE

## 2015-01-20 LAB — APTT: APTT: 26 s (ref 24–37)

## 2015-01-20 MED ORDER — GABAPENTIN 100 MG PO CAPS
200.0000 mg | ORAL_CAPSULE | Freq: Once | ORAL | Status: AC
  Administered 2015-01-20: 200 mg via ORAL
  Filled 2015-01-20: qty 2

## 2015-01-20 MED ORDER — MORPHINE SULFATE 4 MG/ML IJ SOLN
1.0000 mg | Freq: Once | INTRAMUSCULAR | Status: AC
  Administered 2015-01-20: 2 mg via INTRAVENOUS
  Filled 2015-01-20: qty 1

## 2015-01-20 MED ORDER — HYDROCODONE-ACETAMINOPHEN 5-325 MG PO TABS
1.0000 | ORAL_TABLET | ORAL | Status: DC | PRN
  Administered 2015-01-20 – 2015-01-22 (×11): 2 via ORAL
  Filled 2015-01-20 (×11): qty 2

## 2015-01-20 MED ORDER — KETOROLAC TROMETHAMINE 30 MG/ML IJ SOLN
30.0000 mg | Freq: Once | INTRAMUSCULAR | Status: AC
  Administered 2015-01-20: 30 mg via INTRAVENOUS
  Filled 2015-01-20: qty 1

## 2015-01-20 MED ORDER — IPRATROPIUM-ALBUTEROL 0.5-2.5 (3) MG/3ML IN SOLN: 3.0000 mL | RESPIRATORY_TRACT | Status: DC | PRN

## 2015-01-20 MED ORDER — IPRATROPIUM-ALBUTEROL 0.5-2.5 (3) MG/3ML IN SOLN
3.0000 mL | Freq: Two times a day (BID) | RESPIRATORY_TRACT | Status: DC
  Filled 2015-01-20: qty 3

## 2015-01-20 MED ORDER — TRAMADOL HCL 50 MG PO TABS: 50.0000 mg | ORAL_TABLET | Freq: Four times a day (QID) | ORAL | Status: DC | PRN

## 2015-01-20 MED ORDER — ENOXAPARIN SODIUM 60 MG/0.6ML ~~LOC~~ SOLN
55.0000 mg | SUBCUTANEOUS | Status: DC
  Administered 2015-01-20 – 2015-01-22 (×3): 55 mg via SUBCUTANEOUS
  Filled 2015-01-20 (×3): qty 0.6

## 2015-01-20 MED ORDER — GABAPENTIN 400 MG PO CAPS
400.0000 mg | ORAL_CAPSULE | Freq: Three times a day (TID) | ORAL | Status: DC
  Administered 2015-01-20 – 2015-01-22 (×7): 400 mg via ORAL
  Filled 2015-01-20 (×8): qty 1

## 2015-01-20 NOTE — Progress Notes (Signed)
Pt c/o pain to bilat lower extremities. 8/10 md made aware.

## 2015-01-20 NOTE — Consult Note (Signed)
Reason for Consult: Paraesophageal hernia Referring Physician: Dr. Janece Canterbury  Shelby Hatfield is an 39 y.o. female.  HPI: We have been asked to evaluate this patient with a chronic hiatal/paraesophageal hernia. She was admitted with several-day history of shortness of breath, diarrhea, nausea, and leg pain. She denies vomiting. She had a CAT scan of the abdomen and pelvis showing the large para esophageal hernia which also contains colon. There was no evidence of obstruction or bowel compromise. There was actually no change from a CAT scan over one year ago showing the same hernia. The patient reports that she was never told she had a paraesophageal hernia. She does report that her mother told her she saw physicians for a hiatal hernia when she was 39 years old. She denies any reflux or heartburn. She does get some cramping abdominal pain per her report when she eats. Again, she is also been having diarrhea. She reports that her shortness of breath has improved.  Past Medical History  Diagnosis Date  . Bipolar 1 disorder   . Migraines   . Wears dentures     upper denture  . Adenotonsillar hypertrophy 03/2012    snores during sleep; denies apnea; states occ. wakes up coughing  . COPD (chronic obstructive pulmonary disease)   . Anemia   . Depression   . Anxiety   . Obesity   . Hypercholesterolemia   . Pneumonia   . Pancreatitis   . Diabetes mellitus without complication     Past Surgical History  Procedure Laterality Date  . Cesarean section  08/31/2001    total of  3  . Appendectomy    . Cholecystectomy    . Tubal ligation  08/31/2001  . Dilation and evacuation  04/09/2000  . Tonsillectomy and adenoidectomy  04/12/2012    Procedure: TONSILLECTOMY AND ADENOIDECTOMY;  Surgeon: Ascencion Dike, MD;  Location: [facility];  Service: ENT;  Laterality: Bilateral;  . Tonsillectomy      Family History  Problem Relation Age of Onset  . Cancer Other   . Hypertension Mother   .  Heart disease Mother   . Cancer Mother   . Cancer Maternal Aunt   . Cancer Maternal Uncle     lung cancer   . Stroke Maternal Grandfather     Social History:  reports that she has quit smoking. Her smoking use included Cigarettes. She has a 5.94 pack-year smoking history. She has never used smokeless tobacco. She reports that she does not drink alcohol or use illicit drugs.  Allergies:  Allergies  Allergen Reactions  . Penicillins Itching and Rash    Medications: I have reviewed the patient's current medications.  Results for orders placed or performed during the hospital encounter of 01/19/15 (from the past 48 hour(s))  I-stat troponin, ED (not at [facility])     Status: None   Collection Time: 01/19/15  5:46 PM  Result Value Ref Range   Troponin i, poc 0.00 0.00 - 0.08 ng/mL   Comment 3            Comment: Due to the release kinetics of cTnI, a negative result within the first hours of the onset of symptoms does not rule out myocardial infarction with certainty. If myocardial infarction is still suspected, repeat the test at appropriate intervals.   CBC     Status: Abnormal   Collection Time: 01/19/15  5:58 PM  Result Value Ref Range   WBC 12.0 (H) 4.0 - 10.5  K/uL   RBC 4.75 3.87 - 5.11 MIL/uL   Hemoglobin 11.3 (L) 12.0 - 15.0 g/dL   HCT 36.2 36.0 - 46.0 %   MCV 76.2 (L) 78.0 - 100.0 fL   MCH 23.8 (L) 26.0 - 34.0 pg   MCHC 31.2 30.0 - 36.0 g/dL   RDW 16.6 (H) 11.5 - 15.5 %   Platelets 322 150 - 400 K/uL  Basic metabolic panel     Status: Abnormal   Collection Time: 01/19/15  5:58 PM  Result Value Ref Range   Sodium 139 135 - 145 mmol/L   Potassium 3.5 3.5 - 5.1 mmol/L   Chloride 105 96 - 112 mmol/L   CO2 24 19 - 32 mmol/L   Glucose, Bld 136 (H) 70 - 99 mg/dL   BUN <5 (L) 6 - 23 mg/dL   Creatinine, Ser 0.91 0.50 - 1.10 mg/dL   Calcium 8.9 8.4 - 10.5 mg/dL   GFR calc non Af Amer 78 (L) >90 mL/min   GFR calc Af Amer >90 >90 mL/min    Comment: (NOTE) The eGFR has been  calculated using the CKD EPI equation. This calculation has not been validated in all clinical situations. eGFR's persistently <90 mL/min signify possible Chronic Kidney Disease.    Anion gap 10 5 - 15  BNP (order ONLY if patient complains of dyspnea/SOB AND you have documented it for THIS visit)     Status: Abnormal   Collection Time: 01/19/15  5:58 PM  Result Value Ref Range   B Natriuretic Peptide 153.0 (H) 0.0 - 100.0 pg/mL  D-dimer, quantitative     Status: None   Collection Time: 01/19/15  8:47 PM  Result Value Ref Range   D-Dimer, Quant 0.44 0.00 - 0.48 ug/mL-FEU    Comment:        AT THE INHOUSE ESTABLISHED CUTOFF VALUE OF 0.48 ug/mL FEU, THIS ASSAY HAS BEEN DOCUMENTED IN THE LITERATURE TO HAVE A SENSITIVITY AND NEGATIVE PREDICTIVE VALUE OF AT LEAST 98 TO 99%.  THE TEST RESULT SHOULD BE CORRELATED WITH AN ASSESSMENT OF THE CLINICAL PROBABILITY OF DVT / VTE.   I-Stat Beta hCG blood, ED ([facility], [facility], [facility] only)     Status: None   Collection Time: 01/19/15 10:19 PM  Result Value Ref Range   I-stat hCG, quantitative <5.0 <5 mIU/mL   Comment 3            Comment:   GEST. AGE      CONC.  (mIU/mL)   <=1 WEEK        5 - 50     2 WEEKS       50 - 500     3 WEEKS       100 - 10,000     4 WEEKS     1,000 - 30,000        FEMALE AND NON-PREGNANT FEMALE:     LESS THAN 5 mIU/mL   Urine rapid drug screen (hosp performed)     Status: Abnormal   Collection Time: 01/20/15 12:27 AM  Result Value Ref Range   Opiates POSITIVE (A) NONE DETECTED   Cocaine NONE DETECTED NONE DETECTED   Benzodiazepines NONE DETECTED NONE DETECTED   Amphetamines NONE DETECTED NONE DETECTED   Tetrahydrocannabinol NONE DETECTED NONE DETECTED   Barbiturates POSITIVE (A) NONE DETECTED    Comment:        DRUG SCREEN FOR MEDICAL PURPOSES ONLY.  IF CONFIRMATION IS NEEDED FOR ANY PURPOSE, NOTIFY LAB WITHIN  5 DAYS.        LOWEST DETECTABLE LIMITS FOR URINE DRUG SCREEN Drug Class       Cutoff (ng/mL) Amphetamine       1000 Barbiturate      200 Benzodiazepine   324 Tricyclics       401 Opiates          300 Cocaine          300 THC              50   Hepatic function panel     Status: None   Collection Time: 01/20/15 12:43 AM  Result Value Ref Range   Total Protein 6.9 6.0 - 8.3 g/dL   Albumin 3.8 3.5 - 5.2 g/dL   AST 15 0 - 37 U/L   ALT 11 0 - 35 U/L   Alkaline Phosphatase 86 39 - 117 U/L   Total Bilirubin 0.6 0.3 - 1.2 mg/dL   Bilirubin, Direct <0.1 0.0 - 0.5 mg/dL   Indirect Bilirubin NOT CALCULATED 0.3 - 0.9 mg/dL  Protime-INR     Status: None   Collection Time: 01/20/15 12:43 AM  Result Value Ref Range   Prothrombin Time 13.2 11.6 - 15.2 seconds   INR 0.99 0.00 - 1.49  APTT     Status: None   Collection Time: 01/20/15 12:43 AM  Result Value Ref Range   aPTT 26 24 - 37 seconds  Lipase, blood     Status: None   Collection Time: 01/20/15 12:43 AM  Result Value Ref Range   Lipase 27 11 - 59 U/L  Glucose, capillary     Status: None   Collection Time: 01/20/15  6:31 AM  Result Value Ref Range   Glucose-Capillary 98 70 - 99 mg/dL  Glucose, capillary     Status: Abnormal   Collection Time: 01/20/15 11:55 AM  Result Value Ref Range   Glucose-Capillary 103 (H) 70 - 99 mg/dL   Comment 1 Notify RN    Comment 2 Document in Chart     Dg Chest 2 View  01/19/2015   CLINICAL DATA:  Shortness breath leg swelling for 4 days.  EXAM: CHEST  2 VIEW  COMPARISON:  CT chest 11/28/2013.  Chest radiograph 01/18/2015.  FINDINGS: Normal cardiomediastinal silhouette except for slight left-to-right displacement. Considerable worsening of the gaseous distention under the LEFT hemidiaphragm with marked elevation, representing colonic interposition. Hiatal hernia with air-fluid level also noted. Compressive atelectasis LEFT base. No active infiltrates or failure.  IMPRESSION: Considerable worsening of gaseous distention in the LEFT upper quadrant with marked elevation LEFT hemidiaphragm. Compressive atelectasis  LEFT base. Hiatal hernia.   Electronically Signed   By: Rolla Flatten M.D.   On: 01/19/2015 19:29   Ct Chest W Contrast  01/19/2015   CLINICAL DATA:  Acute onset of shortness of breath, lower extremity swelling and leg pain. Bowel distention noted on chest radiograph. Initial encounter.  EXAM: CT CHEST, ABDOMEN, AND PELVIS WITH CONTRAST  TECHNIQUE: Multidetector CT imaging of the chest, abdomen and pelvis was performed following the standard protocol during bolus administration of intravenous contrast.  CONTRAST:  186mL OMNIPAQUE IOHEXOL 300 MG/ML  SOLN  COMPARISON:  Chest radiograph performed earlier today at 6:58 p.m., CTA of the chest performed 11/28/2013, and CT of the abdomen and pelvis from 12/02/2013  FINDINGS: CT CHEST FINDINGS  Mild left basilar atelectasis is noted. The lungs are otherwise clear, though evaluation is mildly suboptimal due to motion artifact. Mild right basilar  scarring is seen. No pleural effusion or pneumothorax identified. No masses are seen.  The mediastinum is unremarkable in appearance. No mediastinal lymphadenopathy is seen. No pericardial effusion is identified. The great vessels are grossly unremarkable in appearance. The visualized portions of thyroid gland are unremarkable. No axillary lymphadenopathy is seen.  No acute osseous abnormalities are identified  CT ABDOMEN AND PELVIS FINDINGS  The patient has a very large posterior paraesophageal hernia, including most of the stomach and also part of the transverse and proximal descending colon. There is no definite evidence of obstruction. The stomach is partially filled with fluid and air.  The liver and spleen are unremarkable in appearance. The patient is status post cholecystectomy, with clips noted at the gallbladder fossa. The pancreas and adrenal glands are unremarkable.  The kidneys are unremarkable in appearance. There is no evidence of hydronephrosis. No renal or ureteral stones are seen. No perinephric stranding is  appreciated. The right kidney is difficult to fully assess due to motion artifact.  No free fluid is identified. The small bowel is unremarkable in appearance. No acute vascular abnormalities are seen.  The patient is status post appendectomy. Aside from herniation of the distal transverse and proximal descending colon, the colon is otherwise unremarkable.  The bladder is mildly distended and grossly unremarkable. The uterus is unremarkable in appearance. The ovaries are relatively symmetric. No suspicious adnexal masses are seen. No inguinal lymphadenopathy is seen.  No acute osseous abnormalities are identified.  IMPRESSION: 1. Very large posterior paraesophageal hernia noted, including most of the stomach and also part of the transverse and proximal descending colon. No definite evidence of obstruction. The stomach is partially filled with fluid and air, corresponding to the finding on chest radiograph. 2. Mild left basilar atelectasis noted. Lungs otherwise clear. Mild right basilar scarring seen. 3. No acute abnormality seen to correspond to the patient's lower extremity swelling.   Electronically Signed   By: Garald Balding M.D.   On: 01/19/2015 23:58   Ct Abdomen Pelvis W Contrast  01/19/2015   CLINICAL DATA:  Acute onset of shortness of breath, lower extremity swelling and leg pain. Bowel distention noted on chest radiograph. Initial encounter.  EXAM: CT CHEST, ABDOMEN, AND PELVIS WITH CONTRAST  TECHNIQUE: Multidetector CT imaging of the chest, abdomen and pelvis was performed following the standard protocol during bolus administration of intravenous contrast.  CONTRAST:  12mL OMNIPAQUE IOHEXOL 300 MG/ML  SOLN  COMPARISON:  Chest radiograph performed earlier today at 6:58 p.m., CTA of the chest performed 11/28/2013, and CT of the abdomen and pelvis from 12/02/2013  FINDINGS: CT CHEST FINDINGS  Mild left basilar atelectasis is noted. The lungs are otherwise clear, though evaluation is mildly suboptimal  due to motion artifact. Mild right basilar scarring is seen. No pleural effusion or pneumothorax identified. No masses are seen.  The mediastinum is unremarkable in appearance. No mediastinal lymphadenopathy is seen. No pericardial effusion is identified. The great vessels are grossly unremarkable in appearance. The visualized portions of thyroid gland are unremarkable. No axillary lymphadenopathy is seen.  No acute osseous abnormalities are identified  CT ABDOMEN AND PELVIS FINDINGS  The patient has a very large posterior paraesophageal hernia, including most of the stomach and also part of the transverse and proximal descending colon. There is no definite evidence of obstruction. The stomach is partially filled with fluid and air.  The liver and spleen are unremarkable in appearance. The patient is status post cholecystectomy, with clips noted at the gallbladder fossa. The  pancreas and adrenal glands are unremarkable.  The kidneys are unremarkable in appearance. There is no evidence of hydronephrosis. No renal or ureteral stones are seen. No perinephric stranding is appreciated. The right kidney is difficult to fully assess due to motion artifact.  No free fluid is identified. The small bowel is unremarkable in appearance. No acute vascular abnormalities are seen.  The patient is status post appendectomy. Aside from herniation of the distal transverse and proximal descending colon, the colon is otherwise unremarkable.  The bladder is mildly distended and grossly unremarkable. The uterus is unremarkable in appearance. The ovaries are relatively symmetric. No suspicious adnexal masses are seen. No inguinal lymphadenopathy is seen.  No acute osseous abnormalities are identified.  IMPRESSION: 1. Very large posterior paraesophageal hernia noted, including most of the stomach and also part of the transverse and proximal descending colon. No definite evidence of obstruction. The stomach is partially filled with fluid and  air, corresponding to the finding on chest radiograph. 2. Mild left basilar atelectasis noted. Lungs otherwise clear. Mild right basilar scarring seen. 3. No acute abnormality seen to correspond to the patient's lower extremity swelling.   Electronically Signed   By: Garald Balding M.D.   On: 01/19/2015 23:58    Review of Systems  All other systems reviewed and are negative.  Blood pressure 131/72, pulse 79, temperature 98.1 F (36.7 C), temperature source Oral, resp. rate 20, height $RemoveBe'5\' 1"'gnBhbGrBn$  (1.549 m), weight 108.364 kg (238 lb 14.4 oz), last menstrual period 12/27/2014, SpO2 94 %. Physical Exam  Constitutional: She is oriented to person, place, and time. No distress.  Obese  HENT:  Head: Normocephalic and atraumatic.  Right Ear: External ear normal.  Left Ear: External ear normal.  Nose: Nose normal.  Eyes: Pupils are equal, round, and reactive to light. No scleral icterus.  Neck: No tracheal deviation present.  Cardiovascular: Normal rate, regular rhythm and normal heart sounds.   No murmur heard. Respiratory: Effort normal and breath sounds normal. No respiratory distress.  GI: Soft. There is no tenderness. There is no guarding.  Neurological: She is alert and oriented to person, place, and time.  Skin: She is diaphoretic.    Assessment/Plan: Chronic paraesophageal hernia  I suspect she has had this for many years. Again, there is no evidence of obstruction or need for emergent surgical intervention. It may or may not cause her to have some shortness of breath given the amount of stomach and colon in the chest. I would recommend that she follows up as an outpatient at our office with one of my partners at [facility] surgery who does hiatal hernia surgery. Currently, there is nothing further for [place] to offer so we will see her as an outpatient.  Paulette Rockford A 01/20/2015, 4:36 PM

## 2015-01-20 NOTE — Progress Notes (Signed)
  Chief Walkup L Delano Frate 01/20/2015, 3:52 PM

## 2015-01-20 NOTE — Progress Notes (Signed)
Pharmacy Consult for Lovenox for  DVT prophylaxis:  Patient is 39 years old with BMI >30 and CrCl >30 mL/min therefore qualifies for 0.5mg /kg q24h dosing for prophylaxis. CBC is stable. Pharmacy will sign off.  Link SnufferJessica Avila Albritton, PharmD, BCPS Clinical Pharmacist [phone] 01/20/2015, 3:47 PM

## 2015-01-20 NOTE — Progress Notes (Addendum)
[facility] PROGRESS NOTE  Shelby Hatfield ZOX:[id number] DOB: 01/18/1976 DOA: 01/19/2015 PCP: Doris Cheadle, MD  Brief Summary  Shelby Hatfield is a 39 y.o. female with past medical history of hyperlipidemia, diabetes mellitus, depression, anxiety, bipolar disorder, migraine headaches, COPD, history of pancreatitis, opioid dependence, benzo dependence, who presented with shortness of breath, nausea, vomiting, and back pain over the last two weeks.  She has also had some increased lower extremity swelling for the same time and diarrhea for the last 5 days.    Progressive shortness of breath for 4 days prior to admission with dry cough.  She was evaluated in emergency room teh day prior to admission and was given a prescription of Lasix which she took without relief.  D-dimer negative, UA without proteinuria, troponin negative, albumin normal.  ECHO is pending.    Patient also reported nausea with vomiting of dark but not black, coffee ground or bilious emesis for the last two weeks.  She had 5-6 loose bowel movements per day for the week prior to admission.  During this same time, she has had pain across her lower back. She completed a course of clindamycin the day prior to admission.  C. Diff PCR is pending.  CT scan demonstrated a chronic large paraesophageal hernia with most of the stomach and part of the transverse and proximal colon in the chest.  No evidence of strangulation or edema or obvious obstruction.     Assessment/Plan  Shortness of breath: Likely due to possible acute heart failure given bilateral leg edema and improving with diuresis. D-dimer is negative ruling out PE. No pneumonia on chest x-ray.  Could her hernia be causing some sort of venous compression or problems with cardiac contractility/restrictive cardiomyopathy? -  Tele:  NSR, okay to d/c telemetry -  Treat COPD and possible CHF as below -  General surgery consult  Leg edema: Etiology is not clear. Urinalysis has  no protein, less likely to have renal etiology. Patient may have undiagnosed congestive heart failure, though BNP is 153, it can be falsely low due to obesity.   - 2d echo:  Preserved EF - Continue Lasix 40 mg IV daily - ASA - LFTs and lipase wnl, including albumin - Weight down 3kg - -1.6L  Nausea, vomiting, diarrhea:  Possibly due to C difficile vs. Side effect of clindamycin given recent antibiotics, however, given the timing with her increased SOB and severe back pain, will have surgery review her CT findings to determine if her hernia may have worsened or caused some colitis or obstruction which would also explain these symptoms. -  LFTs, lipase, and UA wnl -  C. Diff PCR pending -  Advance to CLD -  Appreciate general surgery assistance -  Antiemetics prn  COPD, wheezing but without acute exacerbation - Continue Duonebs and prn albuterol - Mucinex for cough   Diabetes mellitus: A1c was 6.511/25/15.  CBG at goal -  Well controlled.  Patient is supposed to take her metformin, but has not been taking currently. -  Sliding-scale insulin  Psych issue: Including depression, anxiety, bipolar disorder. It is stable. No suicidal or homicidal ideations. -Continue Lamictal, Prozac  Chronic pain with hx of narcotic dependence -  Increase gabapentin -  Resume ultram -  Continue hydrocodone, max of three days for acute problem  Leukocytosis, mild and stable, may be due to viral syndrome, workup for C. difficile diarrhea underway -  Repeat CBC in a.m.  Microcytic anemia, concerning for iron deficiency -  Iron studies, B12, folate, TSH -   occult stool   Diet:  CLD Access:  PIV IVF:  off Proph:  lovenox  Code Status: full Family Communication: patient and her husband Disposition Plan:  Pending further diuresis and evaluation by general surgery.  C. Diff testing, tolerating diet.  Home in 1-2 days  Consultants:  General surgery  Procedures:    CXR   CT  chest/abdo  Antibiotics:  none   HPI/Subjective:  States her wheezing and her leg swelling have improved dramatically.  Is hungry.  No diarrhea since admission but still having some nausea.      Objective: Filed Vitals:   01/20/15 0155 01/20/15 0626 01/20/15 0824 01/20/15 1300  BP:  124/59  131/72  Pulse: 77 71 72 79  Temp:  98.1 F (36.7 C)  98.1 F (36.7 C)  TempSrc:  Oral  Oral  Resp: 22 20 20 20   Height:      Weight:      SpO2: 98% 94%  94%    Intake/Output Summary (Last 24 hours) at 01/20/15 1515 Last data filed at 01/20/15 1400  Gross per 24 hour  Intake      0 ml  Output   2000 ml  Net  -2000 ml   Filed Weights   01/19/15 1735 01/19/15 2336  Weight: 111.585 kg (246 lb) 108.364 kg (238 lb 14.4 oz)    Exam:   General:  Obese female, No acute distress  HEENT:  NCAT, MMM  Cardiovascular:  RRR, nl S1, S2 no mrg, 2+ pulses, warm extremities  Respiratory:  Diminished breath sounds with positive bowel sounds in the left anterior and left posterior chest, otherwise clear, no increased WOB  Abdomen:   NABS, soft, nondistended, mild tenderness to deep palpation in the epigastrium and left upper quadrant  MSK:   Normal tone and bulk, 1+ pitting bilateral LEE  Neuro:  Grossly intact  Data Reviewed: Basic Metabolic Panel:  Recent Labs Lab 01/18/15 1351 01/19/15 1758  NA 139 139  K 3.5 3.5  CL 107 105  CO2 25 24  GLUCOSE 104* 136*  BUN <5* <5*  CREATININE 0.71 0.91  CALCIUM 9.0 8.9   Liver Function Tests:  Recent Labs Lab 01/18/15 1351 01/20/15 0043  AST 16 15  ALT 15 11  ALKPHOS 87 86  BILITOT 0.3 0.6  PROT 6.4 6.9  ALBUMIN 3.6 3.8    Recent Labs Lab 01/20/15 0043  LIPASE 27   No results for input(s): AMMONIA in the last 168 hours. CBC:  Recent Labs Lab 01/18/15 1351 01/19/15 1758  WBC 12.5* 12.0*  NEUTROABS 8.3*  --   HGB 11.6* 11.3*  HCT 36.2 36.2  MCV 75.7* 76.2*  PLT 333 322   Cardiac Enzymes:  Recent Labs Lab  01/18/15 1351  TROPONINI <0.03   BNP (last 3 results)  Recent Labs  01/18/15 1351 01/19/15 1758  BNP 233.7* 153.0*    ProBNP (last 3 results)  Recent Labs  02/10/14 0020 08/17/14 2302  PROBNP 124.8 60.5    CBG:  Recent Labs Lab 01/20/15 0631 01/20/15 1155  GLUCAP 98 103*    No results found for this or any previous visit (from the past 240 hour(s)).   Studies: Dg Chest 2 View  01/19/2015   CLINICAL DATA:  Shortness breath leg swelling for 4 days.  EXAM: CHEST  2 VIEW  COMPARISON:  CT chest 11/28/2013.  Chest radiograph 01/18/2015.  FINDINGS: Normal cardiomediastinal silhouette except for slight left-to-right  displacement. Considerable worsening of the gaseous distention under the LEFT hemidiaphragm with marked elevation, representing colonic interposition. Hiatal hernia with air-fluid level also noted. Compressive atelectasis LEFT base. No active infiltrates or failure.  IMPRESSION: Considerable worsening of gaseous distention in the LEFT upper quadrant with marked elevation LEFT hemidiaphragm. Compressive atelectasis LEFT base. Hiatal hernia.   Electronically Signed   By: Davonna Belling M.D.   On: 01/19/2015 19:29   Dg Chest 2 View  01/18/2015   CLINICAL DATA:  Shortness of breath and cough. RIGHT shoulder pain. Swollen legs for 3 days. History of COPD.  EXAM: CHEST  2 VIEW  COMPARISON:  08/17/2014.  FINDINGS: Low lung volumes persist. Splenic flexure colonic interposition over the spleen. Normal heart size and vascularity. No focal airspace consolidation. No osseous lesions. No pneumothorax.  IMPRESSION: No active cardiopulmonary disease.  Stable exam.   Electronically Signed   By: Davonna Belling M.D.   On: 01/18/2015 15:34   Ct Chest W Contrast  01/19/2015   CLINICAL DATA:  Acute onset of shortness of breath, lower extremity swelling and leg pain. Bowel distention noted on chest radiograph. Initial encounter.  EXAM: CT CHEST, ABDOMEN, AND PELVIS WITH CONTRAST  TECHNIQUE:  Multidetector CT imaging of the chest, abdomen and pelvis was performed following the standard protocol during bolus administration of intravenous contrast.  CONTRAST:  OMNIPAQUE IOHEXOL 300 MG/ML  SOLN  COMPARISON:  Chest radiograph performed earlier today at 6:58 p.m., CTA of the chest performed 11/28/2013, and CT of the abdomen and pelvis from 12/02/2013  FINDINGS: CT CHEST FINDINGS  Mild left basilar atelectasis is noted. The lungs are otherwise clear, though evaluation is mildly suboptimal due to motion artifact. Mild right basilar scarring is seen. No pleural effusion or pneumothorax identified. No masses are seen.  The mediastinum is unremarkable in appearance. No mediastinal lymphadenopathy is seen. No pericardial effusion is identified. The great vessels are grossly unremarkable in appearance. The visualized portions of thyroid gland are unremarkable. No axillary lymphadenopathy is seen.  No acute osseous abnormalities are identified  CT ABDOMEN AND PELVIS FINDINGS  The patient has a very large posterior paraesophageal hernia, including most of the stomach and also part of the transverse and proximal descending colon. There is no definite evidence of obstruction. The stomach is partially filled with fluid and air.  The liver and spleen are unremarkable in appearance. The patient is status post cholecystectomy, with clips noted at the gallbladder fossa. The pancreas and adrenal glands are unremarkable.  The kidneys are unremarkable in appearance. There is no evidence of hydronephrosis. No renal or ureteral stones are seen. No perinephric stranding is appreciated. The right kidney is difficult to fully assess due to motion artifact.  No free fluid is identified. The small bowel is unremarkable in appearance. No acute vascular abnormalities are seen.  The patient is status post appendectomy. Aside from herniation of the distal transverse and proximal descending colon, the colon is otherwise unremarkable.   The bladder is mildly distended and grossly unremarkable. The uterus is unremarkable in appearance. The ovaries are relatively symmetric. No suspicious adnexal masses are seen. No inguinal lymphadenopathy is seen.  No acute osseous abnormalities are identified.  IMPRESSION: 1. Very large posterior paraesophageal hernia noted, including most of the stomach and also part of the transverse and proximal descending colon. No definite evidence of obstruction. The stomach is partially filled with fluid and air, corresponding to the finding on chest radiograph. 2. Mild left basilar atelectasis noted. Lungs otherwise clear.  Mild right basilar scarring seen. 3. No acute abnormality seen to correspond to the patient's lower extremity swelling.   Electronically Signed   By: Roanna Raider M.D.   On: 01/19/2015 23:58   Ct Abdomen Pelvis W Contrast  01/19/2015   CLINICAL DATA:  Acute onset of shortness of breath, lower extremity swelling and leg pain. Bowel distention noted on chest radiograph. Initial encounter.  EXAM: CT CHEST, ABDOMEN, AND PELVIS WITH CONTRAST  TECHNIQUE: Multidetector CT imaging of the chest, abdomen and pelvis was performed following the standard protocol during bolus administration of intravenous contrast.  CONTRAST:  OMNIPAQUE IOHEXOL 300 MG/ML  SOLN  COMPARISON:  Chest radiograph performed earlier today at 6:58 p.m., CTA of the chest performed 11/28/2013, and CT of the abdomen and pelvis from 12/02/2013  FINDINGS: CT CHEST FINDINGS  Mild left basilar atelectasis is noted. The lungs are otherwise clear, though evaluation is mildly suboptimal due to motion artifact. Mild right basilar scarring is seen. No pleural effusion or pneumothorax identified. No masses are seen.  The mediastinum is unremarkable in appearance. No mediastinal lymphadenopathy is seen. No pericardial effusion is identified. The great vessels are grossly unremarkable in appearance. The visualized portions of thyroid gland are  unremarkable. No axillary lymphadenopathy is seen.  No acute osseous abnormalities are identified  CT ABDOMEN AND PELVIS FINDINGS  The patient has a very large posterior paraesophageal hernia, including most of the stomach and also part of the transverse and proximal descending colon. There is no definite evidence of obstruction. The stomach is partially filled with fluid and air.  The liver and spleen are unremarkable in appearance. The patient is status post cholecystectomy, with clips noted at the gallbladder fossa. The pancreas and adrenal glands are unremarkable.  The kidneys are unremarkable in appearance. There is no evidence of hydronephrosis. No renal or ureteral stones are seen. No perinephric stranding is appreciated. The right kidney is difficult to fully assess due to motion artifact.  No free fluid is identified. The small bowel is unremarkable in appearance. No acute vascular abnormalities are seen.  The patient is status post appendectomy. Aside from herniation of the distal transverse and proximal descending colon, the colon is otherwise unremarkable.  The bladder is mildly distended and grossly unremarkable. The uterus is unremarkable in appearance. The ovaries are relatively symmetric. No suspicious adnexal masses are seen. No inguinal lymphadenopathy is seen.  No acute osseous abnormalities are identified.  IMPRESSION: 1. Very large posterior paraesophageal hernia noted, including most of the stomach and also part of the transverse and proximal descending colon. No definite evidence of obstruction. The stomach is partially filled with fluid and air, corresponding to the finding on chest radiograph. 2. Mild left basilar atelectasis noted. Lungs otherwise clear. Mild right basilar scarring seen. 3. No acute abnormality seen to correspond to the patient's lower extremity swelling.   Electronically Signed   By: Roanna Raider M.D.   On: 01/19/2015 23:58    Scheduled Meds: . aspirin EC  81 mg Oral  Daily  . FLUoxetine  20 mg Oral Daily  . furosemide  40 mg Intravenous Daily  . gabapentin  400 mg Oral TID  . guaiFENesin  600 mg Oral BID  . insulin aspart  0-9 Units Subcutaneous TID WC  . ipratropium-albuterol  3 mL Nebulization BID  . lamoTRIgine  25 mg Oral Daily   Continuous Infusions:   Principal Problem:   Shortness of breath Active Problems:   Mixed bipolar I disorder  Opiate dependence, continuous   Benzodiazepine dependence   COPD (chronic obstructive pulmonary disease)   Type 2 diabetes mellitus without complication   Elevated diaphragm   SOB (shortness of breath)   Lower leg edema    Time spent: 30 min    Kameshia Madruga, [facility]  [facility] Pager [phone]. If 7PM-7AM, please contact night-coverage at [email or web address], password [facility] 01/20/2015, 3:15 PM  LOS: 1 day

## 2015-01-21 ENCOUNTER — Inpatient Hospital Stay (HOSPITAL_COMMUNITY): Payer: Self-pay

## 2015-01-21 DIAGNOSIS — I5033 Acute on chronic diastolic (congestive) heart failure: Secondary | ICD-10-CM

## 2015-01-21 DIAGNOSIS — K449 Diaphragmatic hernia without obstruction or gangrene: Secondary | ICD-10-CM

## 2015-01-21 LAB — FOLATE: Folate: 9.2 ng/mL

## 2015-01-21 LAB — URINALYSIS, ROUTINE W REFLEX MICROSCOPIC
Bilirubin Urine: NEGATIVE
Glucose, UA: NEGATIVE mg/dL
Hgb urine dipstick: NEGATIVE
Ketones, ur: NEGATIVE mg/dL
Leukocytes, UA: NEGATIVE
Nitrite: NEGATIVE
Protein, ur: NEGATIVE mg/dL
Specific Gravity, Urine: 1.019 (ref 1.005–1.030)
Urobilinogen, UA: 0.2 mg/dL (ref 0.0–1.0)
pH: 7.5 (ref 5.0–8.0)

## 2015-01-21 LAB — CBC
HEMATOCRIT: 37.6 % (ref 36.0–46.0)
Hemoglobin: 11.7 g/dL — ABNORMAL LOW (ref 12.0–15.0)
MCH: 23.9 pg — AB (ref 26.0–34.0)
MCHC: 31.1 g/dL (ref 30.0–36.0)
MCV: 76.9 fL — AB (ref 78.0–100.0)
PLATELETS: 323 10*3/uL (ref 150–400)
RBC: 4.89 MIL/uL (ref 3.87–5.11)
RDW: 16.7 % — ABNORMAL HIGH (ref 11.5–15.5)
WBC: 10 10*3/uL (ref 4.0–10.5)

## 2015-01-21 LAB — GLUCOSE, CAPILLARY
Glucose-Capillary: 120 mg/dL — ABNORMAL HIGH (ref 70–99)
Glucose-Capillary: 127 mg/dL — ABNORMAL HIGH (ref 70–99)
Glucose-Capillary: 101 mg/dL — ABNORMAL HIGH (ref 70–99)
Glucose-Capillary: 145 mg/dL — ABNORMAL HIGH (ref 70–99)

## 2015-01-21 LAB — BASIC METABOLIC PANEL
Anion gap: 12 (ref 5–15)
BUN: 11 mg/dL (ref 6–23)
CALCIUM: 9 mg/dL (ref 8.4–10.5)
CO2: 22 mmol/L (ref 19–32)
CREATININE: 0.8 mg/dL (ref 0.50–1.10)
Chloride: 102 mmol/L (ref 96–112)
Glucose, Bld: 106 mg/dL — ABNORMAL HIGH (ref 70–99)
Potassium: 4 mmol/L (ref 3.5–5.1)
Sodium: 136 mmol/L (ref 135–145)

## 2015-01-21 LAB — IRON AND TIBC
IRON: 30 ug/dL — AB (ref 42–145)
Saturation Ratios: 9 % — ABNORMAL LOW (ref 20–55)
TIBC: 326 ug/dL (ref 250–470)
UIBC: 296 ug/dL (ref 125–400)

## 2015-01-21 LAB — TSH: TSH: 0.932 u[IU]/mL (ref 0.350–4.500)

## 2015-01-21 LAB — VITAMIN B12: Vitamin B-12: 501 pg/mL (ref 211–911)

## 2015-01-21 LAB — CLOSTRIDIUM DIFFICILE BY PCR: Toxigenic C. Difficile by PCR: NEGATIVE

## 2015-01-21 LAB — FERRITIN: FERRITIN: 12 ng/mL (ref 10–291)

## 2015-01-21 MED ORDER — FERROUS SULFATE 325 (65 FE) MG PO TABS
325.0000 mg | ORAL_TABLET | Freq: Every day | ORAL | Status: DC
  Administered 2015-01-22: 325 mg via ORAL
  Filled 2015-01-21 (×2): qty 1

## 2015-01-21 MED ORDER — FUROSEMIDE 10 MG/ML IJ SOLN
40.0000 mg | Freq: Two times a day (BID) | INTRAMUSCULAR | Status: DC
  Administered 2015-01-21 – 2015-01-22 (×2): 40 mg via INTRAVENOUS
  Filled 2015-01-21 (×3): qty 4

## 2015-01-21 NOTE — Progress Notes (Signed)
Utilization review completed. Zoe Goonan, RN, BSN. 

## 2015-01-21 NOTE — Progress Notes (Signed)
Pt ambulated in hallway with NT on RA. Sats at rest on RA 96% . Pt walked with sats maintained at 95-96%, pt walked length of hallway, 1/2 way back had to stop due to feeling SOB. sats maintained 95-96% during entire walk. Dr Malachi BondsShort informed.

## 2015-01-21 NOTE — Discharge Summary (Signed)
Physician Discharge Summary  Shelby Hatfield AXE:[id number] DOB: [birth year] DOA: 01/19/2015  PCP: Lorayne Marek, MD  Admit date: 01/19/2015 Discharge date: 01/22/2015  Recommendations for Outpatient Follow-up:  1. F/u with General surgery in 2-3 weeks regarding hernia 2. Continue PPI and may use lomotil as needed 3. Consider referral to GI if diarrhea continues 4. PCP: Please follow-up on pending GI pathogen panel. Please check BMP to check creatinine and potassium  Discharge Diagnoses:  Principal Problem:   Acute on chronic diastolic heart failure Active Problems:   Mixed bipolar I disorder   Opiate dependence, continuous   Benzodiazepine dependence   COPD (chronic obstructive pulmonary disease)   Shortness of breath   Type 2 diabetes mellitus without complication   Elevated diaphragm   SOB (shortness of breath)   Diarrhea   Lower leg edema   Nausea with vomiting   Paraesophageal hiatal hernia   Discharge Condition: stable, improved  Diet recommendation: diabetic/healthy heart  Wt Readings from Last 3 Encounters:  01/22/15 102.195 kg (225 lb 4.8 oz)  01/18/15 111.63 kg (246 lb 1.6 oz)  01/10/15 104.327 kg (230 lb)    History of present illness:   Shelby Hatfield is a 39 y.o. female with past medical history of hyperlipidemia, diabetes mellitus, depression, anxiety, bipolar disorder, migraine headaches, COPD, history of pancreatitis, opioid dependence, benzo dependence, who presented with shortness of breath, nausea, vomiting, and back pain over the last two weeks. She has also had some increased lower extremity swelling for the same time and diarrhea for the last 5 days.   Progressive shortness of breath for 4 days prior to admission with dry cough. She was evaluated in emergency room teh day prior to admission and was given a prescription of Lasix which she took without relief. D-dimer negative, UA without proteinuria, troponin negative, albumin normal. ECHO is  pending.   Patient also reported nausea with vomiting of dark but not black, coffee ground or bilious emesis for the last two weeks. She had 5-6 loose bowel movements per day for the week prior to admission. During this same time, she has had pain across her lower back. She completed a course of clindamycin the day prior to admission. C. Diff PCR is pending. CT scan demonstrated a chronic large paraesophageal hernia with most of the stomach and part of the transverse and proximal colon in the chest. No evidence of strangulation or edema or obvious obstruction.   Hospital Course:   Acute hypoxic respiratory failure likely due to possible acute on chronic diastolic heart failure given bilateral leg edema and improving with diuresis. D-dimer is negative ruling out PE. No pneumonia on chest x-ray. Her large paraesophageal hernia is likely contributing to her SOB - Tele: NSR, okay to d/c telemetry - Treat COPD and possible CHF as below - Appreciate General surgery assistance -   Follow-up with general surgery and 2-3 weeks to schedule possible hernia repair  Lower extremity edema probably due to acute on chronic diastolic heart failure.  -  Albumin normal, UA without proteinuria -  No signs of cirrhosis.    -  TSH wnl -  2d echo: Preserved EF, nomral valves, RV appears normal in size and function.  IVC normal in size -  Started lasix $RemoveBefor'40mg'aFwJstWjunVP$  IV BID and she diuresed approximately 30 pounds -  Weight down to 225-lbs at discharge -   Able to maintain oxygen saturations on room air  Nausea, vomiting, diarrhea: Possibly due to side effect of clindamycin given recent  antibiotics vs. viral syndrome or complication from hernia - LFTs, lipase, and UA wnl - C. Diff PCR neg - GI pathogen panel pending  COPD, wheezing but without acute exacerbation - Continue Duonebs and prn albuterol - Mucinex for cough   Diabetes mellitus: A1c was 6.5 08/22/14. CBG at goal - Well controlled.  Patient is supposed to take her metformin, but has not been taking currently. - Sliding-scale insulin  Psych issue: Including depression, anxiety, bipolar disorder. It is stable. No suicidal or homicidal ideations. -Continue Lamictal, Prozac  Chronic pain with hx of narcotic dependence - Increased gabapentin while inpatient - Resumed ultram  Leukocytosis, mild and stable, may be due to viral syndrome, workup for C. difficile diarrhea underway - Repeat CBC in a.m.  Microcytic anemia, concerning for iron deficiency - Iron studies c/w iron deficiency - Started iron supplementation - B12 501, folate 9.2, TSH 0.932 - occult stool neg  Right leg anterior shin swelling.  Ddx includes damaged lymphatics causing worsening swelling in this area.  Not a normal area for DVT but could be concerning for early erythema nodosum -  XR shin unremarkable -  CT shin:  Soft tissue swelling without abscess or underlying abnormality -  Continue diuresis as above -  Consider autoimmune/crohn's work up if erythema nodosum declares itself.  Consultants:  General surgery  Procedures:   CXR  CT chest/abdo  Antibiotics:  none  Discharge Exam: Filed Vitals:   01/22/15 0627  BP: 136/86  Pulse: 86  Temp: 98.2 F (36.8 C)  Resp: 20   Filed Vitals:   01/21/15 0630 01/21/15 1456 01/21/15 2130 01/22/15 0627  BP: 121/60 133/77 127/85 136/86  Pulse: 73 77 74 86  Temp: 98.2 F (36.8 C) 98.4 F (36.9 C) 98.5 F (36.9 C) 98.2 F (36.8 C)  TempSrc: Oral Oral Oral Oral  Resp: _0 Height:      Weight: 104.962 kg (231 lb 6.4 oz)   102.195 kg (225 lb 4.8 oz)  SpO2: 94% 96% 96% 98%     General: Obese female, No acute distress  HEENT: NCAT, MMM  Cardiovascular: RRR, nl S1, S2 no mrg, 2+ pulses, warm extremities  Respiratory: Diminished breath sounds with positive bowel sounds in the left anterior and left posterior chest, otherwise clear, no increased WOB  Abdomen:  NABS, soft, nondistended, mild tenderness to deep palpation in the epigastrium and left upper quadrant  MSK: Normal tone and bulk, trace pitting bilateral LEE with persistent mild swelling of anterior right shin with TTP but no overlying erythema or induration  Neuro: Grossly intact  Discharge Instructions      Discharge Instructions    (HEART FAILURE PATIENTS) Call MD:  Anytime you have any of the following symptoms: 1) 3 pound weight gain in 24 hours or 5 pounds in 1 week 2) shortness of breath, with or without a dry hacking cough 3) swelling in the hands, feet or stomach 4) if you have to sleep on extra pillows at night in order to breathe.    Complete by:  As directed      Call MD for:  difficulty breathing, headache or visual disturbances    Complete by:  As directed      Call MD for:  extreme fatigue    Complete by:  As directed      Call MD for:  hives    Complete by:  As directed      Call MD for:  persistant dizziness or  light-headedness    Complete by:  As directed      Call MD for:  persistant nausea and vomiting    Complete by:  As directed      Call MD for:  severe uncontrolled pain    Complete by:  As directed      Call MD for:  temperature >100.4    Complete by:  As directed      Diet - low sodium heart healthy    Complete by:  As directed      Diet Carb Modified    Complete by:  As directed      Discharge instructions    Complete by:  As directed   You were hospitalized with shortness of breath, back pains, leg swelling, and diarrhea.  Please take lasix 35m by mouth twice a day with potassium once a day.  Eat a low salt diet.  If you ever feel you are becoming dehydrated, please stop both your potassium and your lasix.  Please see your primary care doctor in about one week to have your bloodwork repeated to check your kidney function and potassium level.  Please continue to take your symbicort and spiriva.  For your diarrhea, your doctor will need to follow up on a  pending test.  Please stop your antibiotics.  Schedule an appointment the surgeons within the next month to talk about your hernia and possibly repairing your hernia.  If you have worsening pain, fainting spells, difficulty breathing, please call [phone].     Increase activity slowly    Complete by:  As directed             Medication List    STOP taking these medications        clindamycin 150 MG capsule  Commonly known as:  CLEOCIN     doxycycline 100 MG tablet  Commonly known as:  VIBRA-TABS     loratadine 10 MG tablet  Commonly known as:  CLARITIN     predniSONE 20 MG tablet  Commonly known as:  DELTASONE     traMADol 50 MG tablet  Commonly known as:  ULTRAM     traZODone 50 MG tablet  Commonly known as:  DESYREL      TAKE these medications        blood glucose meter kit and supplies  Dispense based on patient and insurance preference. Use to check sugar two times a day (fasting in am and at bedtime). (FOR ICD-9 250.00, 250.01). Please provide the glucometer with cheapest strips     budesonide-formoterol 160-4.5 MCG/ACT inhaler  Commonly known as:  SYMBICORT  Inhale 2 puffs into the lungs 2 (two) times daily.     ferrous sulfate 325 (65 FE) MG tablet  Take 1 tablet (325 mg total) by mouth daily with breakfast.     FLUoxetine 20 MG capsule  Commonly known as:  PROZAC  Take 1 capsule (20 mg total) by mouth daily.     furosemide 20 MG tablet  Commonly known as:  LASIX  Take 1 tablet (20 mg total) by mouth 2 (two) times daily.     gabapentin 100 MG capsule  Commonly known as:  NEURONTIN  Take 2 capsules (200 mg total) by mouth 3 (three) times daily.     guaiFENesin 600 MG 12 hr tablet  Commonly known as:  MUCINEX  Take 1 tablet (600 mg total) by mouth 2 (two) times daily.     HYDROcodone-acetaminophen 5-325 MG per tablet  Commonly known as:  NORCO/VICODIN  Take 2 tablets by mouth every 4 (four) hours as needed for moderate pain.     hydrOXYzine 25 MG tablet   Commonly known as:  ATARAX/VISTARIL  Take 1 tablet (25 mg total) by mouth every 6 (six) hours as needed for anxiety.     lamoTRIgine 25 MG tablet  Commonly known as:  LAMICTAL  Take 1 tablet (25 mg total) by mouth daily.     metFORMIN 500 MG tablet  Commonly known as:  GLUCOPHAGE  Take 1 tablet (500 mg total) by mouth 2 (two) times daily with a meal.     omeprazole 20 MG capsule  Commonly known as:  PRILOSEC  Take 1 capsule (20 mg total) by mouth daily.     potassium chloride SA 20 MEQ tablet  Commonly known as:  K-DUR,KLOR-CON  Take 1 tablet (20 mEq total) by mouth daily.     tiotropium 18 MCG inhalation capsule  Commonly known as:  SPIRIVA  Place 1 capsule (18 mcg total) into inhaler and inhale daily.       Follow-up Information    Follow up with Lorayne Marek, MD In 1 week.   Specialty:  Internal Medicine   Contact information:   [place] [address] [phone]       Follow up with [facility], MD. Schedule an appointment as soon as possible for a visit in 1 month.   Specialty:  General Surgery   Contact information:   [place] [address] [phone]        The results of significant diagnostics from this hospitalization (including imaging, microbiology, ancillary and laboratory) are listed below for reference.    Significant Diagnostic Studies: Dg Chest 2 View  01/19/2015   CLINICAL DATA:  Shortness breath leg swelling for 4 days.  EXAM: CHEST  2 VIEW  COMPARISON:  CT chest 11/28/2013.  Chest radiograph 01/18/2015.  FINDINGS: Normal cardiomediastinal silhouette except for slight left-to-right displacement. Considerable worsening of the gaseous distention under the LEFT hemidiaphragm with marked elevation, representing colonic interposition. Hiatal hernia with air-fluid level also noted. Compressive atelectasis LEFT base. No active infiltrates or failure.  IMPRESSION: Considerable worsening of gaseous  distention in the LEFT upper quadrant with marked elevation LEFT hemidiaphragm. Compressive atelectasis LEFT base. Hiatal hernia.   Electronically Signed   By: Rolla Flatten M.D.   On: 01/19/2015 19:29   Dg Chest 2 View  01/18/2015   CLINICAL DATA:  Shortness of breath and cough. RIGHT shoulder pain. Swollen legs for 3 days. History of COPD.  EXAM: CHEST  2 VIEW  COMPARISON:  08/17/2014.  FINDINGS: Low lung volumes persist. Splenic flexure colonic interposition over the spleen. Normal heart size and vascularity. No focal airspace consolidation. No osseous lesions. No pneumothorax.  IMPRESSION: No active cardiopulmonary disease.  Stable exam.   Electronically Signed   By: Rolla Flatten M.D.   On: 01/18/2015 15:34   Dg Chest 2 View  01/16/2015   CLINICAL DATA:  COPD for 4 years, shortness of breath for several months getting worse  EXAM: CHEST  2 VIEW  COMPARISON:  None  FINDINGS: Normal heart size and pulmonary vascularity.  Bowel loops are identified at the inferior mediastinum and at the inferior LEFT hemi thorax.  This could represent a markedly elevated LEFT diaphragm or a diaphragmatic hernia.  Minimal bronchitic changes and LEFT basilar atelectasis.  No infiltrate, pleural effusion or pneumothorax.  Bones unremarkable.  IMPRESSION: Numerous bowel loops at the inferior mediastinum and inferior LEFT hemi thorax question related to markedly elevated LEFT diaphragm versus diaphragmatic hernia.  Minimal bronchitic changes and LEFT basilar atelectasis.   Electronically Signed   By: Lavonia Dana M.D.   On: 01/16/2015 12:19   Dg Tibia/fibula Right  01/21/2015   CLINICAL DATA:  Right lower leg pain/ swelling x2 weeks, no known injury  EXAM: RIGHT TIBIA AND FIBULA - 2 VIEW  COMPARISON:  None.  FINDINGS: No fracture or dislocation is seen.  The joint spaces are preserved.  Visualized soft tissues are within normal limits.  IMPRESSION: No fracture or dislocation is seen.   Electronically Signed   By: Julian Hy M.D.   On: 01/21/2015 19:04   Ct Chest W Contrast  01/19/2015   CLINICAL DATA:  Acute onset of shortness of breath, lower extremity swelling and leg pain. Bowel distention noted on chest radiograph. Initial encounter.  EXAM: CT CHEST, ABDOMEN, AND PELVIS WITH CONTRAST  TECHNIQUE: Multidetector CT imaging of the chest, abdomen and pelvis was performed following the standard protocol during bolus administration of intravenous contrast.  CONTRAST:  157m OMNIPAQUE IOHEXOL 300 MG/ML  SOLN  COMPARISON:  Chest radiograph performed earlier today at 6:58 p.m., CTA of the chest performed 11/28/2013, and CT of the abdomen and pelvis from 12/02/2013  FINDINGS: CT CHEST FINDINGS  Mild left basilar atelectasis is noted. The lungs are otherwise clear, though evaluation is mildly suboptimal due to motion artifact. Mild right basilar scarring is seen. No pleural effusion or pneumothorax identified. No masses are seen.  The mediastinum is unremarkable in appearance. No mediastinal lymphadenopathy is seen. No pericardial effusion is identified. The great vessels are grossly unremarkable in appearance. The visualized portions of thyroid gland are unremarkable. No axillary lymphadenopathy is seen.  No acute osseous abnormalities are identified  CT ABDOMEN AND PELVIS FINDINGS  The patient has a very large posterior paraesophageal hernia, including most of the stomach and also part of the transverse and proximal descending colon. There is no definite evidence of obstruction. The stomach is partially filled with fluid and air.  The liver and spleen are unremarkable in appearance. The patient is status post cholecystectomy, with clips noted at the gallbladder fossa. The pancreas and adrenal glands are unremarkable.  The kidneys are unremarkable in appearance. There is no evidence of hydronephrosis. No renal or ureteral stones are seen. No perinephric stranding is appreciated. The right kidney is difficult to fully assess due to  motion artifact.  No free fluid is identified. The small bowel is unremarkable in appearance. No acute vascular abnormalities are seen.  The patient is status post appendectomy. Aside from herniation of the distal transverse and proximal descending colon, the colon is otherwise unremarkable.  The bladder is mildly distended and grossly unremarkable. The uterus is unremarkable in appearance. The ovaries are relatively symmetric. No suspicious adnexal masses are seen. No inguinal lymphadenopathy is seen.  No acute osseous abnormalities are identified.  IMPRESSION: 1. Very large posterior paraesophageal hernia noted, including most of the stomach and also part of the transverse and proximal descending colon. No definite evidence of obstruction. The stomach is partially filled with fluid and air, corresponding to the finding on chest radiograph. 2. Mild left basilar atelectasis noted. Lungs otherwise clear. Mild right basilar scarring seen. 3. No acute abnormality seen to correspond to the patient's lower extremity swelling.   Electronically Signed   By: JGarald BaldingM.D.   On: 01/19/2015 23:58  Ct Abdomen Pelvis W Contrast  01/19/2015   CLINICAL DATA:  Acute onset of shortness of breath, lower extremity swelling and leg pain. Bowel distention noted on chest radiograph. Initial encounter.  EXAM: CT CHEST, ABDOMEN, AND PELVIS WITH CONTRAST  TECHNIQUE: Multidetector CT imaging of the chest, abdomen and pelvis was performed following the standard protocol during bolus administration of intravenous contrast.  CONTRAST:  140m OMNIPAQUE IOHEXOL 300 MG/ML  SOLN  COMPARISON:  Chest radiograph performed earlier today at 6:58 p.m., CTA of the chest performed 11/28/2013, and CT of the abdomen and pelvis from 12/02/2013  FINDINGS: CT CHEST FINDINGS  Mild left basilar atelectasis is noted. The lungs are otherwise clear, though evaluation is mildly suboptimal due to motion artifact. Mild right basilar scarring is seen. No  pleural effusion or pneumothorax identified. No masses are seen.  The mediastinum is unremarkable in appearance. No mediastinal lymphadenopathy is seen. No pericardial effusion is identified. The great vessels are grossly unremarkable in appearance. The visualized portions of thyroid gland are unremarkable. No axillary lymphadenopathy is seen.  No acute osseous abnormalities are identified  CT ABDOMEN AND PELVIS FINDINGS  The patient has a very large posterior paraesophageal hernia, including most of the stomach and also part of the transverse and proximal descending colon. There is no definite evidence of obstruction. The stomach is partially filled with fluid and air.  The liver and spleen are unremarkable in appearance. The patient is status post cholecystectomy, with clips noted at the gallbladder fossa. The pancreas and adrenal glands are unremarkable.  The kidneys are unremarkable in appearance. There is no evidence of hydronephrosis. No renal or ureteral stones are seen. No perinephric stranding is appreciated. The right kidney is difficult to fully assess due to motion artifact.  No free fluid is identified. The small bowel is unremarkable in appearance. No acute vascular abnormalities are seen.  The patient is status post appendectomy. Aside from herniation of the distal transverse and proximal descending colon, the colon is otherwise unremarkable.  The bladder is mildly distended and grossly unremarkable. The uterus is unremarkable in appearance. The ovaries are relatively symmetric. No suspicious adnexal masses are seen. No inguinal lymphadenopathy is seen.  No acute osseous abnormalities are identified.  IMPRESSION: 1. Very large posterior paraesophageal hernia noted, including most of the stomach and also part of the transverse and proximal descending colon. No definite evidence of obstruction. The stomach is partially filled with fluid and air, corresponding to the finding on chest radiograph. 2. Mild  left basilar atelectasis noted. Lungs otherwise clear. Mild right basilar scarring seen. 3. No acute abnormality seen to correspond to the patient's lower extremity swelling.   Electronically Signed   By: JGarald BaldingM.D.   On: 01/19/2015 23:58   Ct Tibia Fibula Right W Contrast  01/22/2015   CLINICAL DATA:  Pain and swelling of the right lower leg for 1 week.  EXAM: CT OF THE RIGHT TIBIA AND FIBULA WITH CONTRAST  TECHNIQUE: 80 cc Omnipaque 300  CONTRAST:  832mOMNIPAQUE IOHEXOL 300 MG/ML  SOLN  COMPARISON:  Radiographs dated 01/21/2015  FINDINGS: The tibia and fibula appear normal. There are no ankle or knee joint effusions. The muscles and vascular structures appear normal. There is slight edema in the subcutaneous fat in the distal 1/3 of the lower leg extending to the ankle. No abscesses.  IMPRESSION: Slight subcutaneous edema in the distal right lower leg, nonspecific. Otherwise, normal exam.   Electronically Signed   By: JaDina Rich.  On: 01/22/2015 08:47    Microbiology: Recent Results (from the past 240 hour(s))  Clostridium Difficile by PCR     Status: None   Collection Time: 01/20/15  7:00 PM  Result Value Ref Range Status   C difficile by pcr NEGATIVE NEGATIVE Final     Labs: Basic Metabolic Panel:  Recent Labs Lab 01/18/15 1351 01/19/15 1758 01/21/15 0527 01/22/15 0605  NA 139 139 136 138  K 3.5 3.5 4.0 3.8  CL 107 105 102 104  CO2 _0 GLUCOSE 104* 136* 106* 126*  BUN <5* <5* 11 11  CREATININE 0.71 0.91 0.80 0.74  CALCIUM 9.0 8.9 9.0 9.0   Liver Function Tests:  Recent Labs Lab 01/18/15 1351 01/20/15 0043  AST 16 15  ALT 15 11  ALKPHOS 87 86  BILITOT 0.3 0.6  PROT 6.4 6.9  ALBUMIN 3.6 3.8    Recent Labs Lab 01/20/15 0043  LIPASE 27   No results for input(s): AMMONIA in the last 168 hours. CBC:  Recent Labs Lab 01/18/15 1351 01/19/15 1758 01/21/15 0527  WBC 12.5* 12.0* 10.0  NEUTROABS 8.3*  --   --   HGB 11.6* 11.3* 11.7*   HCT 36.2 36.2 37.6  MCV 75.7* 76.2* 76.9*  PLT 333 322 323   Cardiac Enzymes:  Recent Labs Lab 01/18/15 1351  TROPONINI <0.03   BNP: BNP (last 3 results)  Recent Labs  01/18/15 1351 01/19/15 1758  BNP 233.7* 153.0*    ProBNP (last 3 results)  Recent Labs  02/10/14 0020 08/17/14 2302  PROBNP 124.8 60.5    CBG:  Recent Labs Lab 01/21/15 1128 01/21/15 1634 01/21/15 2128 01/22/15 0611 01/22/15 1135  GLUCAP 127* 101* 145* 122* 135*    Time coordinating discharge: 35 minutes  Signed:  Deania Siguenza  [facility] 01/22/2015, 1:40 PM

## 2015-01-21 NOTE — Progress Notes (Signed)
[facility] PROGRESS NOTE  SYENNA NAZIR WUJ:[id number] DOB: [birth year] DOA: 01/19/2015 PCP: Doris Cheadle, MD  Brief Summary  Shelby Hatfield is a 39 y.o. female with past medical history of hyperlipidemia, diabetes mellitus, depression, anxiety, bipolar disorder, migraine headaches, COPD, history of pancreatitis, opioid dependence, benzo dependence, who presented with shortness of breath, nausea, vomiting, and back pain over the last two weeks.  She has also had some increased lower extremity swelling for the same time and diarrhea for the last 5 days.    Progressive shortness of breath for 4 days prior to admission with dry cough.  She was evaluated in emergency room teh day prior to admission and was given a prescription of Lasix which she took without relief.  D-dimer negative, UA without proteinuria, troponin negative, albumin normal.  ECHO is pending.    Patient also reported nausea with vomiting of dark but not black, coffee ground or bilious emesis for the last two weeks.  She had 5-6 loose bowel movements per day for the week prior to admission.  During this same time, she has had pain across her lower back. She completed a course of clindamycin the day prior to admission.  C. Diff PCR is pending.  CT scan demonstrated a chronic large paraesophageal hernia with most of the stomach and part of the transverse and proximal colon in the chest.  No evidence of strangulation or edema or obvious obstruction.     Assessment/Plan  Shortness of breath: Likely due to possible acute on chronic diastolic heart failure given bilateral leg edema and improving with diuresis. D-dimer is negative ruling out PE. No pneumonia on chest x-ray.  Her hernia is likely contributing to her SOB -  Tele:  NSR, okay to d/c telemetry -  Treat COPD and possible CHF as below -  Appreciate General surgery assistance  Probable acute on chronic diastolic heart failure.  Other causes of lower extremity edema seem  less likely:  Albumin normal, UA without proteinuria, no signs of cirrhosis - 2d echo:  Preserved EF - increase to Lasix 40 mg IV BID - ASA - Weight down to 231-lbs - -2L -  Still SOB with minimal exertion  Nausea, vomiting, diarrhea:  Possibly due to C difficile vs. Side effect of clindamycin given recent antibiotics vs. Viral syndrome -  LFTs, lipase, and UA wnl -  C. Diff PCR neg -   GI pathogen panel pending  COPD, wheezing but without acute exacerbation - Continue Duonebs and prn albuterol - Mucinex for cough   Diabetes mellitus: A1c was 6.511/25/15.  CBG at goal -  Well controlled.  Patient is supposed to take her metformin, but has not been taking currently. -  Sliding-scale insulin  Psych issue: Including depression, anxiety, bipolar disorder. It is stable. No suicidal or homicidal ideations. -Continue Lamictal, Prozac  Chronic pain with hx of narcotic dependence -  Increase gabapentin -  Resumed ultram -  Continue hydrocodone, max of three days for acute problem, day 2 of 3  Leukocytosis, mild and stable, may be due to viral syndrome, workup for C. difficile diarrhea underway -  Repeat CBC in a.m.  Microcytic anemia, concerning for iron deficiency -  Iron studies c/w iron deficiency -  Start iron supplementation -  B12 501, folate 9.2, TSH 0.932 -   occult stool neg  Diet:  Diabetic, healthy heart, 1.2L Access:  PIV IVF:  off Proph:  lovenox  Code Status: full Family Communication: patient  Alone. She was  upset because she was hoping to have surgery for her hernia during this hospitalization.   She understands that she will need to follow-up with general surgery in clinic for ongoing management of her symptoms until outpatient surgery can be arranged. She states that she will have an appointment on May 2 that may allow her to have her Medicaid reinstated. I have offered to write her a letter to assist her in getting her health insurance reinstated sooner so that  she can undergo surgery. Disposition Plan:  Pending further diuresis and improvement in SOB.    Consultants:  General surgery  Procedures:    CXR   CT chest/abdo  Antibiotics:  none   HPI/Subjective:  States her wheezing and her leg swelling have improved  But she was still Taelyr Jantz of breath while walking around her room, showering, and she had to stop to rest when she tried to walk down the hall.   Her dyspnea was witnessed by the nurse. By stopping to rest she was able to maintain her oxygen saturations in the normal range.   She continues to have pain in her back  And nausea when she eats.    Her diarrhea has returned. She states that she has frequent formed stools normally but currently her stools are very liquidy. Objective: Filed Vitals:   01/20/15 1300 01/20/15 2221 01/21/15 0630 01/21/15 1456  BP: 131/72 124/60 121/60 133/77  Pulse: 79 74 73 77  Temp: 98.1 F (36.7 C) 98.1 F (36.7 C) 98.2 F (36.8 C) 98.4 F (36.9 C)  TempSrc: Oral Oral Oral Oral  Resp: Height:      Weight:   104.962 kg (231 lb 6.4 oz)   SpO2: 94% 97% 94% 96%    Intake/Output Summary (Last 24 hours) at 01/21/15 1619 Last data filed at 01/21/15 1200  Gross per 24 hour  Intake   1300 ml  Output   1900 ml  Net   -600 ml   Filed Weights   01/19/15 1735 01/19/15 2336 01/21/15 0630  Weight: 111.585 kg (246 lb) 108.364 kg (238 lb 14.4 oz) 104.962 kg (231 lb 6.4 oz)    Exam:   General:  Obese female, No acute distress  HEENT:  NCAT, MMM  Cardiovascular:  RRR, nl S1, S2 no mrg, 2+ pulses, warm extremities  Respiratory:  Diminished breath sounds with positive bowel sounds in the left anterior and left posterior chest, otherwise clear, no increased WOB  Abdomen:   NABS, soft, nondistended, mild tenderness to deep palpation in the epigastrium and left upper quadrant  MSK:   Normal tone and bulk, 1+ pitting bilateral LEE  Neuro:  Grossly intact  Data Reviewed: Basic Metabolic  Panel:  Recent Labs Lab 01/18/15 1351 01/19/15 1758 01/21/15 0527  NA 139 139 136  K 3.5 3.5 4.0  CL 107 105 102  CO2 GLUCOSE 104* 136* 106*  BUN <5* <5* 11  CREATININE 0.71 0.91 0.80  CALCIUM 9.0 8.9 9.0   Liver Function Tests:  Recent Labs Lab 01/18/15 1351 01/20/15 0043  AST 16 15  ALT 15 11  ALKPHOS 87 86  BILITOT 0.3 0.6  PROT 6.4 6.9  ALBUMIN 3.6 3.8    Recent Labs Lab 01/20/15 0043  LIPASE 27   No results for input(s): AMMONIA in the last 168 hours. CBC:  Recent Labs Lab 01/18/15 1351 01/19/15 1758 01/21/15 0527  WBC 12.5* 12.0* 10.0  NEUTROABS 8.3*  --   --  HGB 11.6* 11.3* 11.7*  HCT 36.2 36.2 37.6  MCV 75.7* 76.2* 76.9*  PLT 333 322 323   Cardiac Enzymes:  Recent Labs Lab 01/18/15 1351  TROPONINI <0.03   BNP (last 3 results)  Recent Labs  01/18/15 1351 01/19/15 1758  BNP 233.7* 153.0*    ProBNP (last 3 results)  Recent Labs  02/10/14 0020 08/17/14 2302  PROBNP 124.8 60.5    CBG:  Recent Labs Lab 01/20/15 1155 01/20/15 1640 01/20/15 2218 01/21/15 0627 01/21/15 1128  GLUCAP 103* 166* 125* 120* 127*    Recent Results (from the past 240 hour(s))  Clostridium Difficile by PCR     Status: None   Collection Time: 01/20/15  7:00 PM  Result Value Ref Range Status   C difficile by pcr NEGATIVE NEGATIVE Final     Studies: Dg Chest 2 View  01/19/2015   CLINICAL DATA:  Shortness breath leg swelling for 4 days.  EXAM: CHEST  2 VIEW  COMPARISON:  CT chest 11/28/2013.  Chest radiograph 01/18/2015.  FINDINGS: Normal cardiomediastinal silhouette except for slight left-to-right displacement. Considerable worsening of the gaseous distention under the LEFT hemidiaphragm with marked elevation, representing colonic interposition. Hiatal hernia with air-fluid level also noted. Compressive atelectasis LEFT base. No active infiltrates or failure.  IMPRESSION: Considerable worsening of gaseous distention in the LEFT upper  quadrant with marked elevation LEFT hemidiaphragm. Compressive atelectasis LEFT base. Hiatal hernia.   Electronically Signed   By: Davonna Belling M.D.   On: 01/19/2015 19:29   Ct Chest W Contrast  01/19/2015   CLINICAL DATA:  Acute onset of shortness of breath, lower extremity swelling and leg pain. Bowel distention noted on chest radiograph. Initial encounter.  EXAM: CT CHEST, ABDOMEN, AND PELVIS WITH CONTRAST  TECHNIQUE: Multidetector CT imaging of the chest, abdomen and pelvis was performed following the standard protocol during bolus administration of intravenous contrast.  CONTRAST:  OMNIPAQUE IOHEXOL 300 MG/ML  SOLN  COMPARISON:  Chest radiograph performed earlier today at 6:58 p.m., CTA of the chest performed 11/28/2013, and CT of the abdomen and pelvis from 12/02/2013  FINDINGS: CT CHEST FINDINGS  Mild left basilar atelectasis is noted. The lungs are otherwise clear, though evaluation is mildly suboptimal due to motion artifact. Mild right basilar scarring is seen. No pleural effusion or pneumothorax identified. No masses are seen.  The mediastinum is unremarkable in appearance. No mediastinal lymphadenopathy is seen. No pericardial effusion is identified. The great vessels are grossly unremarkable in appearance. The visualized portions of thyroid gland are unremarkable. No axillary lymphadenopathy is seen.  No acute osseous abnormalities are identified  CT ABDOMEN AND PELVIS FINDINGS  The patient has a very large posterior paraesophageal hernia, including most of the stomach and also part of the transverse and proximal descending colon. There is no definite evidence of obstruction. The stomach is partially filled with fluid and air.  The liver and spleen are unremarkable in appearance. The patient is status post cholecystectomy, with clips noted at the gallbladder fossa. The pancreas and adrenal glands are unremarkable.  The kidneys are unremarkable in appearance. There is no evidence of  hydronephrosis. No renal or ureteral stones are seen. No perinephric stranding is appreciated. The right kidney is difficult to fully assess due to motion artifact.  No free fluid is identified. The small bowel is unremarkable in appearance. No acute vascular abnormalities are seen.  The patient is status post appendectomy. Aside from herniation of the distal transverse and proximal descending colon, the colon  is otherwise unremarkable.  The bladder is mildly distended and grossly unremarkable. The uterus is unremarkable in appearance. The ovaries are relatively symmetric. No suspicious adnexal masses are seen. No inguinal lymphadenopathy is seen.  No acute osseous abnormalities are identified.  IMPRESSION: 1. Very large posterior paraesophageal hernia noted, including most of the stomach and also part of the transverse and proximal descending colon. No definite evidence of obstruction. The stomach is partially filled with fluid and air, corresponding to the finding on chest radiograph. 2. Mild left basilar atelectasis noted. Lungs otherwise clear. Mild right basilar scarring seen. 3. No acute abnormality seen to correspond to the patient's lower extremity swelling.   Electronically Signed   By: Roanna Raider M.D.   On: 01/19/2015 23:58   Ct Abdomen Pelvis W Contrast  01/19/2015   CLINICAL DATA:  Acute onset of shortness of breath, lower extremity swelling and leg pain. Bowel distention noted on chest radiograph. Initial encounter.  EXAM: CT CHEST, ABDOMEN, AND PELVIS WITH CONTRAST  TECHNIQUE: Multidetector CT imaging of the chest, abdomen and pelvis was performed following the standard protocol during bolus administration of intravenous contrast.  CONTRAST:  OMNIPAQUE IOHEXOL 300 MG/ML  SOLN  COMPARISON:  Chest radiograph performed earlier today at 6:58 p.m., CTA of the chest performed 11/28/2013, and CT of the abdomen and pelvis from 12/02/2013  FINDINGS: CT CHEST FINDINGS  Mild left basilar atelectasis  is noted. The lungs are otherwise clear, though evaluation is mildly suboptimal due to motion artifact. Mild right basilar scarring is seen. No pleural effusion or pneumothorax identified. No masses are seen.  The mediastinum is unremarkable in appearance. No mediastinal lymphadenopathy is seen. No pericardial effusion is identified. The great vessels are grossly unremarkable in appearance. The visualized portions of thyroid gland are unremarkable. No axillary lymphadenopathy is seen.  No acute osseous abnormalities are identified  CT ABDOMEN AND PELVIS FINDINGS  The patient has a very large posterior paraesophageal hernia, including most of the stomach and also part of the transverse and proximal descending colon. There is no definite evidence of obstruction. The stomach is partially filled with fluid and air.  The liver and spleen are unremarkable in appearance. The patient is status post cholecystectomy, with clips noted at the gallbladder fossa. The pancreas and adrenal glands are unremarkable.  The kidneys are unremarkable in appearance. There is no evidence of hydronephrosis. No renal or ureteral stones are seen. No perinephric stranding is appreciated. The right kidney is difficult to fully assess due to motion artifact.  No free fluid is identified. The small bowel is unremarkable in appearance. No acute vascular abnormalities are seen.  The patient is status post appendectomy. Aside from herniation of the distal transverse and proximal descending colon, the colon is otherwise unremarkable.  The bladder is mildly distended and grossly unremarkable. The uterus is unremarkable in appearance. The ovaries are relatively symmetric. No suspicious adnexal masses are seen. No inguinal lymphadenopathy is seen.  No acute osseous abnormalities are identified.  IMPRESSION: 1. Very large posterior paraesophageal hernia noted, including most of the stomach and also part of the transverse and proximal descending colon. No  definite evidence of obstruction. The stomach is partially filled with fluid and air, corresponding to the finding on chest radiograph. 2. Mild left basilar atelectasis noted. Lungs otherwise clear. Mild right basilar scarring seen. 3. No acute abnormality seen to correspond to the patient's lower extremity swelling.   Electronically Signed   By: Roanna Raider M.D.   On: 01/19/2015  23:58    Scheduled Meds: . aspirin EC  81 mg Oral Daily  . enoxaparin (LOVENOX) injection  55 mg Subcutaneous Q24H  . [START ON 01/22/2015] ferrous sulfate  325 mg Oral Q breakfast  . FLUoxetine  20 mg Oral Daily  . furosemide  40 mg Intravenous Daily  . gabapentin  400 mg Oral TID  . guaiFENesin  600 mg Oral BID  . insulin aspart  0-9 Units Subcutaneous TID WC  . lamoTRIgine  25 mg Oral Daily   Continuous Infusions:   Principal Problem:   Shortness of breath Active Problems:   Mixed bipolar I disorder   Opiate dependence, continuous   Benzodiazepine dependence   COPD (chronic obstructive pulmonary disease)   Type 2 diabetes mellitus without complication   Elevated diaphragm   SOB (shortness of breath)   Diarrhea   Lower leg edema   Nausea with vomiting    Time spent: 30 min    Jaylyn Iyer  [facility] Pager [phone]. If 7PM-7AM, please contact night-coverage at [email or web address], password [email or web address] 01/21/2015, 4:19 PM  LOS: 2 days

## 2015-01-21 NOTE — Progress Notes (Signed)
The patient complained of flank pain and foul smelling urine.  Shelby Hatfield was notified and new orders were written.

## 2015-01-22 ENCOUNTER — Inpatient Hospital Stay (HOSPITAL_COMMUNITY): Payer: Medicaid Other

## 2015-01-22 DIAGNOSIS — K449 Diaphragmatic hernia without obstruction or gangrene: Secondary | ICD-10-CM

## 2015-01-22 DIAGNOSIS — F1320 Sedative, hypnotic or anxiolytic dependence, uncomplicated: Secondary | ICD-10-CM

## 2015-01-22 DIAGNOSIS — I5033 Acute on chronic diastolic (congestive) heart failure: Principal | ICD-10-CM

## 2015-01-22 LAB — BASIC METABOLIC PANEL
ANION GAP: 12 (ref 5–15)
BUN: 11 mg/dL (ref 6–23)
CALCIUM: 9 mg/dL (ref 8.4–10.5)
CO2: 22 mmol/L (ref 19–32)
Chloride: 104 mmol/L (ref 96–112)
Creatinine, Ser: 0.74 mg/dL (ref 0.50–1.10)
GFR calc Af Amer: >90 mL/min (ref >90)
GFR calc non Af Amer: >90 mL/min (ref >90)
GLUCOSE: 126 mg/dL — AB (ref 70–99)
Potassium: 3.8 mmol/L (ref 3.5–5.1)
Sodium: 138 mmol/L (ref 135–145)

## 2015-01-22 LAB — URINE CULTURE: Colony Count: 25000

## 2015-01-22 LAB — GLUCOSE, CAPILLARY
Glucose-Capillary: 122 mg/dL — ABNORMAL HIGH (ref 70–99)
Glucose-Capillary: 135 mg/dL — ABNORMAL HIGH (ref 70–99)

## 2015-01-22 LAB — TRANSFERRIN: Transferrin: 280 mg/dL (ref 200–370)

## 2015-01-22 MED ORDER — IOHEXOL 300 MG/ML  SOLN
80.0000 mL | Freq: Once | INTRAMUSCULAR | Status: AC | PRN
  Administered 2015-01-22: 80 mL via INTRAVENOUS

## 2015-01-22 MED ORDER — HYDROCODONE-ACETAMINOPHEN 5-325 MG PO TABS: 2.0000 | ORAL_TABLET | ORAL | Status: DC | PRN

## 2015-01-22 MED ORDER — FERROUS SULFATE 325 (65 FE) MG PO TABS: 325.0000 mg | ORAL_TABLET | Freq: Every day | ORAL | Status: DC

## 2015-01-22 MED ORDER — OMEPRAZOLE 20 MG PO CPDR: 20.0000 mg | DELAYED_RELEASE_CAPSULE | Freq: Every day | ORAL | Status: DC

## 2015-01-22 MED ORDER — POTASSIUM CHLORIDE CRYS ER 20 MEQ PO TBCR: 20.0000 meq | EXTENDED_RELEASE_TABLET | Freq: Every day | ORAL | Status: DC

## 2015-01-22 MED ORDER — FUROSEMIDE 20 MG PO TABS
20.0000 mg | ORAL_TABLET | Freq: Two times a day (BID) | ORAL | Status: DC
Start: 2015-01-22 — End: 2015-09-30

## 2015-01-22 NOTE — Progress Notes (Signed)
1535 discharge instructions and prescriptions given .Vervbalized understanding

## 2015-01-23 LAB — GI PATHOGEN PANEL BY PCR, STOOL
C DIFFICILE TOXIN A/B: NOT DETECTED
CAMPYLOBACTER BY PCR: NOT DETECTED
Cryptosporidium by PCR: NOT DETECTED
E COLI (ETEC) LT/ST: NOT DETECTED
E COLI 0157 BY PCR: NOT DETECTED
E coli (STEC): NOT DETECTED
G lamblia by PCR: NOT DETECTED
Norovirus GI/GII: NOT DETECTED
ROTAVIRUS A BY PCR: NOT DETECTED
SALMONELLA BY PCR: NOT DETECTED
Shigella by PCR: NOT DETECTED

## 2015-01-28 ENCOUNTER — Inpatient Hospital Stay: Payer: Self-pay | Admitting: Family Medicine

## 2015-02-07 ENCOUNTER — Encounter: Payer: Self-pay | Admitting: Emergency Medicine

## 2015-02-07 ENCOUNTER — Emergency Department
Admission: EM | Admit: 2015-02-07 | Discharge: 2015-02-07 | Disposition: A | Discharge status: Home / Self Care | Payer: Medicaid Other | Attending: Emergency Medicine | Admitting: Emergency Medicine

## 2015-02-07 DIAGNOSIS — E119 Type 2 diabetes mellitus without complications: Secondary | ICD-10-CM | POA: Insufficient documentation

## 2015-02-07 DIAGNOSIS — Z792 Long term (current) use of antibiotics: Secondary | ICD-10-CM | POA: Insufficient documentation

## 2015-02-07 DIAGNOSIS — Z880 Allergy status to penicillin: Secondary | ICD-10-CM | POA: Insufficient documentation

## 2015-02-07 DIAGNOSIS — Z79899 Other long term (current) drug therapy: Secondary | ICD-10-CM | POA: Insufficient documentation

## 2015-02-07 DIAGNOSIS — L03011 Cellulitis of right finger: Secondary | ICD-10-CM | POA: Insufficient documentation

## 2015-02-07 DIAGNOSIS — L02212 Cutaneous abscess of back [any part, except buttock]: Secondary | ICD-10-CM

## 2015-02-07 DIAGNOSIS — Z7951 Long term (current) use of inhaled steroids: Secondary | ICD-10-CM | POA: Insufficient documentation

## 2015-02-07 DIAGNOSIS — IMO0001 Reserved for inherently not codable concepts without codable children: Secondary | ICD-10-CM

## 2015-02-07 DIAGNOSIS — Z87891 Personal history of nicotine dependence: Secondary | ICD-10-CM | POA: Insufficient documentation

## 2015-02-07 MED ORDER — SULFAMETHOXAZOLE-TRIMETHOPRIM 800-160 MG PO TABS
ORAL_TABLET | ORAL | Status: AC
  Filled 2015-02-07: qty 1

## 2015-02-07 MED ORDER — HYDROCODONE-ACETAMINOPHEN 5-325 MG PO TABS
1.0000 | ORAL_TABLET | Freq: Once | ORAL | Status: AC
  Administered 2015-02-07: 1 via ORAL

## 2015-02-07 MED ORDER — HYDROCODONE-ACETAMINOPHEN 5-325 MG PO TABS: 1.0000 | ORAL_TABLET | ORAL | Status: DC | PRN

## 2015-02-07 MED ORDER — SULFAMETHOXAZOLE-TRIMETHOPRIM 800-160 MG PO TABS
1.0000 | ORAL_TABLET | Freq: Once | ORAL | Status: AC
  Administered 2015-02-07: 1 via ORAL

## 2015-02-07 MED ORDER — SULFAMETHOXAZOLE-TRIMETHOPRIM 800-160 MG PO TABS: 1.0000 | ORAL_TABLET | Freq: Two times a day (BID) | ORAL | Status: DC

## 2015-02-07 MED ORDER — HYDROCODONE-ACETAMINOPHEN 5-325 MG PO TABS
ORAL_TABLET | ORAL | Status: AC
  Administered 2015-02-07: 1 via ORAL
  Filled 2015-02-07: qty 1

## 2015-02-07 NOTE — Discharge Instructions (Signed)
Abscess An abscess (boil or furuncle) is an infected area on or under the skin. This area is filled with yellowish-white fluid (pus) and other material (debris). HOME CARE   Only take medicines as told by your doctor.  If you were given antibiotic medicine, take it as directed. Finish the medicine even if you start to feel better.  If gauze is used, follow your doctor's directions for changing the gauze.  To avoid spreading the infection:  Keep your abscess covered with a bandage.  Wash your hands well.  Do not share personal care items, towels, or whirlpools with others.  Avoid skin contact with others.  Keep your skin and clothes clean around the abscess.  Keep all doctor visits as told. GET HELP RIGHT AWAY IF:   You have more pain, puffiness (swelling), or redness in the wound site.  You have more fluid or blood coming from the wound site.  You have muscle aches, chills, or you feel sick.  You have a fever. MAKE SURE YOU:   Understand these instructions.  Will watch your condition.  Will get help right away if you are not doing well or get worse. Document Released: 03/02/2008 Document Revised: 03/15/2012 Document Reviewed: 11/27/2011 [facility] Patient Information 2015 [place]. This information is not intended to replace advice given to you by your health care provider. Make sure you discuss any questions you have with your health care provider. Soak in warm water when you get home.  Take all of your medications

## 2015-02-07 NOTE — ED Provider Notes (Signed)
[facility] Emergency Department Provider Note  ____________________________________________  Time seen: Approximately 9:55 PM  I have reviewed the triage vital signs and the nursing notes.   HISTORY  Chief Complaint Nail Problem   HPI Shelby Hatfield is a 39 y.o. female complains of abscess on her right fourth finger. Also she has a place on her back that is tender.She has had abscesses before but states that she has never been told she has MRSA. She did stick a pin in her finger several nights ago but did not get any pus out of it. She denies any fever. She is not taking any medication for this. Touching these areas makes it hurt worse. There is nothing that she has been able to do this made feel better. This patient has been going on for several days   Past Medical History  Diagnosis Date  . Bipolar 1 disorder   . Migraines   . Wears dentures     upper denture  . Adenotonsillar hypertrophy 03/2012    snores during sleep; denies apnea; states occ. wakes up coughing  . COPD (chronic obstructive pulmonary disease)   . Anemia   . Depression   . Anxiety   . Obesity   . Hypercholesterolemia   . Pneumonia   . Pancreatitis   . Diabetes mellitus without complication     Patient Active Problem List   Diagnosis Date Noted  . Acute on chronic diastolic heart failure 83/66/2947  . 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 Nausea with vomiting 01/20/2015  . 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 SOB (shortness of breath) 01/19/2015  . Lower leg edema 01/19/2015  . Diarrhea   . Type 2 diabetes mellitus without complication   . COPD (chronic obstructive pulmonary disease) 08/18/2014  . COPD exacerbation 08/18/2014  . Chest tightness 08/18/2014  . Shortness of breath   . Opiate addiction 05/22/2014  . 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 Mixed bipolar I disorder 04/20/2013  . Opiate dependence, continuous 04/20/2013  . Benzodiazepine dependence  04/20/2013    Past Surgical History  Procedure Laterality Date  . Cesarean section  08/31/2001    total of  3  . Appendectomy    . Cholecystectomy    . Tubal ligation  08/31/2001  . Dilation and evacuation  04/09/2000  . Tonsillectomy and adenoidectomy  04/12/2012    Procedure: TONSILLECTOMY AND ADENOIDECTOMY;  Surgeon: Ascencion Dike, MD;  Location: [place];  Service: ENT;  Laterality: Bilateral;  . Tonsillectomy      Current Outpatient Rx  Name  Route  Sig  Dispense  Refill  . loratadine (CLARITIN) 10 MG tablet   Oral   Take 10 mg by mouth daily.         . Blood Glucose Monitoring Suppl (BLOOD GLUCOSE METER KIT AND SUPPLIES)      Dispense based on patient and insurance preference. Use to check sugar two times a day (fasting in am and at bedtime). (FOR ICD-9 250.00, 250.01). Please provide the glucometer with cheapest strips   1 each   5   . budesonide-formoterol (SYMBICORT) 160-4.5 MCG/ACT inhaler   Inhalation   Inhale 2 puffs into the lungs 2 (two) times daily.   1 Inhaler   12   . ferrous sulfate 325 (65 FE) MG tablet   Oral   Take 1 tablet (325 mg total) by mouth daily with breakfast.   30 tablet   0   . FLUoxetine (PROZAC) 20 MG  capsule   Oral   Take 1 capsule (20 mg total) by mouth daily.   30 capsule   0   . furosemide (LASIX) 20 MG tablet   Oral   Take 1 tablet (20 mg total) by mouth 2 (two) times daily.   60 tablet   0   . gabapentin (NEURONTIN) 100 MG capsule   Oral   Take 2 capsules (200 mg total) by mouth 3 (three) times daily.   180 capsule   0   . guaiFENesin (MUCINEX) 600 MG 12 hr tablet   Oral   Take 1 tablet (600 mg total) by mouth 2 (two) times daily.   60 tablet   0   . HYDROcodone-acetaminophen (NORCO/VICODIN) 5-325 MG per tablet   Oral   Take 2 tablets by mouth every 4 (four) hours as needed for moderate pain.   20 tablet   0   . HYDROcodone-acetaminophen (NORCO/VICODIN) 5-325 MG per tablet   Oral   Take 1  tablet by mouth every 4 (four) hours as needed for moderate pain.   15 tablet   0   . hydrOXYzine (ATARAX/VISTARIL) 25 MG tablet   Oral   Take 1 tablet (25 mg total) by mouth every 6 (six) hours as needed for anxiety.   30 tablet   0   . lamoTRIgine (LAMICTAL) 25 MG tablet   Oral   Take 1 tablet (25 mg total) by mouth daily.   30 tablet   0   . metFORMIN (GLUCOPHAGE) 500 MG tablet   Oral   Take 1 tablet (500 mg total) by mouth 2 (two) times daily with a meal. Patient not taking: Reported on 01/10/2015   60 tablet   1   . omeprazole (PRILOSEC) 20 MG capsule   Oral   Take 1 capsule (20 mg total) by mouth daily.   30 capsule   0   . potassium chloride SA (K-DUR,KLOR-CON) 20 MEQ tablet   Oral   Take 1 tablet (20 mEq total) by mouth daily.   30 tablet   0   . sulfamethoxazole-trimethoprim (BACTRIM DS) 800-160 MG per tablet   Oral   Take 1 tablet by mouth 2 (two) times daily.   20 tablet   0   . tiotropium (SPIRIVA) 18 MCG inhalation capsule   Inhalation   Place 1 capsule (18 mcg total) into inhaler and inhale daily. Patient not taking: Reported on 01/10/2015   30 capsule   12     Allergies Penicillins  Family History  Problem Relation Age of Onset  . Cancer Other   . Hypertension Mother   . Heart disease Mother   . Cancer Mother   . Cancer Maternal Aunt   . Cancer Maternal Uncle     lung cancer   . Stroke Maternal Grandfather     Social History History  Substance Use Topics  . Smoking status: Former Smoker -- 0.33 packs/day for 18 years    Types: Cigarettes  . Smokeless tobacco: Never Used  . Alcohol Use: No    Review of Systems Constitutional: No fever/chills Eyes: No visual changes. ENT: No sore throat. Cardiovascular: Denies chest pain. Respiratory: Denies shortness of breath. Gastrointestinal: No abdominal pain.  No nausea, no vomiting.  Genitourinary: Negative for dysuria. Musculoskeletal: Negative for back pain. Skin: Negative for  rash. Positive for abscesses in the past Neurological: Negative for headaches 10-point ROS otherwise negative.  ____________________________________________   PHYSICAL EXAM:  VITAL SIGNS: ED Triage Vitals  Enc Vitals Group     BP 02/07/15 2103 125/74 mmHg     Pulse Rate 02/07/15 2103 83     Resp 02/07/15 2103 18     Temp 02/07/15 2103 98.2 F (36.8 C)     Temp Source 02/07/15 2103 Oral     SpO2 02/07/15 2103 100 %     Weight 02/07/15 2103 200 lb (90.719 kg)     Height 02/07/15 2103 $RemoveBefor'5\' 2"'[id number]$  (1.575 m)     Head Cir --      Peak Flow --      Pain Score 02/07/15 2103 9     Pain Loc --      Pain Edu? --      Excl. in [place]? --     Constitutional: Alert and oriented. Well appearing and in no acute distress. Eyes: Conjunctivae are normal. PERRL. EOMI. Head: Atraumatic. Nose: No congestion/rhinnorhea. Neck: No stridor.   Cardiovascular: Normal rate, regular rhythm. Grossly normal heart sounds.  Good peripheral circulation. Respiratory: Normal respiratory effort.  No retractions. Lungs CTAB. Gastrointestinal: Soft and nontender. No distention. No abdominal bruits.  Musculoskeletal: No lower extremity tenderness nor edema.  No joint effusions. Neurologic:  Normal speech and language. No gross focal neurologic deficits are appreciated. Speech is normal. No gait instability. Skin:  Skin is warm, dry and intact.  There is a small pea size round tender, erythematous lesion middle of her back. It is very hard on palpation. Also a right fourth finger is an area of pus laterally to the nail.  This is slightly fluctuant, with erythema Psychiatric: Mood and affect are normal. Speech and behavior are normal.  ____________________________________________   LABS (all labs ordered are listed, but only abnormal results are displayed)  Labs Reviewed - No data to  display ____________________________________________  EKG  None ____________________________________________  RADIOLOGY  no ____________________________________________   PROCEDURES  Procedure(s) performed: Area was cleaned with alcohol swab. Area lateral to the fingernail was lanced with an 18-gauge needle. There was small amount of pus removed from the area. Patient tolerated this quite well.  Critical Care performed: No  ____________________________________________   INITIAL IMPRESSION / ASSESSMENT AND PLAN / ED COURSE  Pertinent labs & imaging results that were available during my care of the patient were reviewed by me and considered in my medical decision making (see chart for details).  Patient is to start on Septra DS for infection. She was also given a prescription for Norco for pain she is continue warm compresses to her back and also soak her finger in warm water. She is to return to the emergency room if if any severe worsening or urgent concerns. ____________________________________________   FINAL CLINICAL IMPRESSION(S) / ED DIAGNOSES  Final diagnoses:  Paronychia of fourth finger of right hand  Cutaneous abscess of back excluding buttocks      Johnn Hai, PA-C 02/07/15 2345  I was apparently available to supervise this patient if I have been requested to do so was briefly reviewed appears to be in order  Nena Polio, MD 02/14/15 1811

## 2015-02-07 NOTE — ED Notes (Signed)
Pt with infection and swelling to nail bed of right 4th finger for 4 days, also has area to back that is swollen and red.

## 2015-03-03 ENCOUNTER — Encounter (HOSPITAL_COMMUNITY): Payer: Self-pay | Admitting: *Deleted

## 2015-03-03 ENCOUNTER — Emergency Department (HOSPITAL_COMMUNITY)
Admission: EM | Admit: 2015-03-03 | Discharge: 2015-03-03 | Disposition: A | Discharge status: Home / Self Care | Payer: Medicaid Other | Attending: Emergency Medicine | Admitting: Emergency Medicine

## 2015-03-03 DIAGNOSIS — Z792 Long term (current) use of antibiotics: Secondary | ICD-10-CM | POA: Insufficient documentation

## 2015-03-03 DIAGNOSIS — K088 Other specified disorders of teeth and supporting structures: Secondary | ICD-10-CM | POA: Insufficient documentation

## 2015-03-03 DIAGNOSIS — G8929 Other chronic pain: Secondary | ICD-10-CM | POA: Insufficient documentation

## 2015-03-03 DIAGNOSIS — Z79899 Other long term (current) drug therapy: Secondary | ICD-10-CM | POA: Insufficient documentation

## 2015-03-03 DIAGNOSIS — H9201 Otalgia, right ear: Secondary | ICD-10-CM | POA: Insufficient documentation

## 2015-03-03 DIAGNOSIS — Z87891 Personal history of nicotine dependence: Secondary | ICD-10-CM | POA: Insufficient documentation

## 2015-03-03 DIAGNOSIS — D649 Anemia, unspecified: Secondary | ICD-10-CM | POA: Insufficient documentation

## 2015-03-03 DIAGNOSIS — Z7951 Long term (current) use of inhaled steroids: Secondary | ICD-10-CM | POA: Insufficient documentation

## 2015-03-03 DIAGNOSIS — Z8659 Personal history of other mental and behavioral disorders: Secondary | ICD-10-CM | POA: Insufficient documentation

## 2015-03-03 DIAGNOSIS — Z98811 Dental restoration status: Secondary | ICD-10-CM | POA: Insufficient documentation

## 2015-03-03 DIAGNOSIS — E669 Obesity, unspecified: Secondary | ICD-10-CM | POA: Insufficient documentation

## 2015-03-03 DIAGNOSIS — J449 Chronic obstructive pulmonary disease, unspecified: Secondary | ICD-10-CM | POA: Insufficient documentation

## 2015-03-03 DIAGNOSIS — K0889 Other specified disorders of teeth and supporting structures: Secondary | ICD-10-CM

## 2015-03-03 DIAGNOSIS — E119 Type 2 diabetes mellitus without complications: Secondary | ICD-10-CM | POA: Insufficient documentation

## 2015-03-03 DIAGNOSIS — Z880 Allergy status to penicillin: Secondary | ICD-10-CM | POA: Insufficient documentation

## 2015-03-03 DIAGNOSIS — Z8701 Personal history of pneumonia (recurrent): Secondary | ICD-10-CM | POA: Insufficient documentation

## 2015-03-03 DIAGNOSIS — G43909 Migraine, unspecified, not intractable, without status migrainosus: Secondary | ICD-10-CM | POA: Insufficient documentation

## 2015-03-03 MED ORDER — CLINDAMYCIN HCL 150 MG PO CAPS
300.0000 mg | ORAL_CAPSULE | Freq: Once | ORAL | Status: AC
  Administered 2015-03-03: 300 mg via ORAL
  Filled 2015-03-03: qty 2

## 2015-03-03 MED ORDER — BUPIVACAINE-EPINEPHRINE (PF) 0.5% -1:200000 IJ SOLN
1.8000 mL | Freq: Once | INTRAMUSCULAR | Status: AC
  Administered 2015-03-03: 1.8 mL
  Filled 2015-03-03: qty 1.8

## 2015-03-03 MED ORDER — CLINDAMYCIN HCL 300 MG PO CAPS: 300.0000 mg | ORAL_CAPSULE | Freq: Four times a day (QID) | ORAL | Status: DC

## 2015-03-03 NOTE — ED Notes (Signed)
Patient states "last took ibuprofen at 5:30 this morning.

## 2015-03-03 NOTE — ED Notes (Signed)
Patient refused discharge vitals.

## 2015-03-03 NOTE — ED Notes (Signed)
Pt reports tooth pain on lower teeth. Pt stats that she knows that she has a cavity and broken tooth. Pt states that pain has been ongoing for 3 days.

## 2015-03-03 NOTE — ED Provider Notes (Signed)
CSN: [id number]     Arrival date & time 03/03/15  1039 History  This chart was scribed for non-physician practitioner, Al Corpus, PA-C, working with Lacretia Leigh, MD, by Stephania Fragmin, ED Scribe. This patient was seen in room TR07C/TR07C and the patient's care was started at 12:32 PM.    Chief Complaint  Patient presents with  . Dental Pain   The history is provided by the patient. No language interpreter was used.     HPI Comments: [name] is a 39 y.o. female with a history of Type 2 DM, who presents to the Emergency Department complaining of right lower dental pain radiating to her right ear, ongoing for 3 days. She also complains of associated gingival swelling, chills and nausea due to pain. She has taken Tylenol, BC powder, and ibuprofen, all with no relief. Patient states her DM recently has been well-controlled. She last saw a dentist 1 year ago. She denies any known fever. Patient has known allergies to penicillin. No chest pain, SOB, drooling or fevers.  Past Medical History  Diagnosis Date  . Bipolar 1 disorder   . Migraines   . Wears dentures     upper denture  . Adenotonsillar hypertrophy 03/2012    snores during sleep; denies apnea; states occ. wakes up coughing  . COPD (chronic obstructive pulmonary disease)   . Anemia   . Depression   . Anxiety   . Obesity   . Hypercholesterolemia   . Pneumonia   . Pancreatitis   . Diabetes mellitus without complication    Past Surgical History  Procedure Laterality Date  . Cesarean section  08/31/2001    total of  3  . Appendectomy    . Cholecystectomy    . Tubal ligation  08/31/2001  . Dilation and evacuation  04/09/2000  . Tonsillectomy and adenoidectomy  04/12/2012    Procedure: TONSILLECTOMY AND ADENOIDECTOMY;  Surgeon: Ascencion Dike, MD;  Location: [place];  Service: ENT;  Laterality: Bilateral;  . Tonsillectomy     Family History  Problem Relation Age of Onset  . Cancer Other   . Hypertension  Mother   . Heart disease Mother   . Cancer Mother   . Cancer Maternal Aunt   . Cancer Maternal Uncle     lung cancer   . Stroke Maternal Grandfather    History  Substance Use Topics  . Smoking status: Former Smoker -- 0.33 packs/day for 18 years    Types: Cigarettes  . Smokeless tobacco: Never Used  . Alcohol Use: No   OB History    Gravida Para Term Preterm AB TAB SAB Ectopic Multiple Living   _0 Review of Systems  Constitutional: Positive for chills. Negative for fever.  HENT: Positive for dental problem and ear pain.   Gastrointestinal: Positive for nausea. Negative for vomiting.    Allergies  Penicillins  Home Medications   Prior to Admission medications   Medication Sig Start Date End Date Taking? Authorizing Provider  Blood Glucose Monitoring Suppl (BLOOD GLUCOSE METER KIT AND SUPPLIES) Dispense based on patient and insurance preference. Use to check sugar two times a day (fasting in am and at bedtime). (FOR ICD-9 250.00, 250.01). Please provide the glucometer with cheapest strips 08/21/14   Barton Dubois, MD  budesonide-formoterol [facility]) 160-4.5 MCG/ACT inhaler Inhale 2 puffs into the lungs 2 (two) times daily. 08/21/14   Barton Dubois, MD  clindamycin (CLEOCIN) 300 MG capsule Take 1 capsule (300 mg total) by mouth 4 (four) times daily. X 7 days 03/03/15   Al Corpus, PA-C  ferrous sulfate 325 (65 FE) MG tablet Take 1 tablet (325 mg total) by mouth daily with breakfast. 01/22/15   Janece Canterbury, MD  FLUoxetine (PROZAC) 20 MG capsule Take 1 capsule (20 mg total) by mouth daily. 05/27/14   Niel Hummer, NP  furosemide (LASIX) 20 MG tablet Take 1 tablet (20 mg total) by mouth 2 (two) times daily. 01/22/15   Janece Canterbury, MD  gabapentin (NEURONTIN) 100 MG capsule Take 2 capsules (200 mg total) by mouth 3 (three) times daily. 05/27/14   Niel Hummer, NP  guaiFENesin (MUCINEX) 600 MG 12 hr tablet Take 1 tablet (600 mg total) by mouth 2 (two) times  daily. 08/21/14   Barton Dubois, MD  HYDROcodone-acetaminophen (NORCO/VICODIN) 5-325 MG per tablet Take 2 tablets by mouth every 4 (four) hours as needed for moderate pain. 01/22/15   Janece Canterbury, MD  HYDROcodone-acetaminophen (NORCO/VICODIN) 5-325 MG per tablet Take 1 tablet by mouth every 4 (four) hours as needed for moderate pain. 02/07/15   Johnn Hai, PA-C  hydrOXYzine (ATARAX/VISTARIL) 25 MG tablet Take 1 tablet (25 mg total) by mouth every 6 (six) hours as needed for anxiety. 05/27/14   Niel Hummer, NP  lamoTRIgine (LAMICTAL) 25 MG tablet Take 1 tablet (25 mg total) by mouth daily. 05/27/14   Niel Hummer, NP  loratadine (CLARITIN) 10 MG tablet Take 10 mg by mouth daily.    Historical Provider, MD  metFORMIN (GLUCOPHAGE) 500 MG tablet Take 1 tablet (500 mg total) by mouth 2 (two) times daily with a meal. Patient not taking: Reported on 01/10/2015 08/21/14   Barton Dubois, MD  omeprazole (PRILOSEC) 20 MG capsule Take 1 capsule (20 mg total) by mouth daily. 01/22/15   Janece Canterbury, MD  potassium chloride SA (K-DUR,KLOR-CON) 20 MEQ tablet Take 1 tablet (20 mEq total) by mouth daily. 01/22/15   Janece Canterbury, MD  sulfamethoxazole-trimethoprim (BACTRIM DS) 800-160 MG per tablet Take 1 tablet by mouth 2 (two) times daily. 02/07/15   Johnn Hai, PA-C  tiotropium (SPIRIVA) 18 MCG inhalation capsule Place 1 capsule (18 mcg total) into inhaler and inhale daily. Patient not taking: Reported on 01/10/2015 05/27/14   Niel Hummer, NP   BP 118/66 mmHg  Pulse 96  Temp(Src) 98.2 F (36.8 C) (Oral)  Resp 22  SpO2 94%  LMP 12/13/2014 Physical Exam  Constitutional: She appears well-developed and well-nourished. No distress.  HENT:  Head: Normocephalic and atraumatic.  Mouth/Throat: Oropharynx is clear and moist. No oropharyngeal exudate.  No trismus or uvula deviation No lip, tongue, facial swelling. No swelling under tongue or tenderness. Patient handling secretions. No  drooling. Patient with poor dentition. Patient with significant tenderness to teeth #27, #28 with mild erythema in this gingiva but no drainable abscess.   Eyes: Conjunctivae are normal. Right eye exhibits no discharge. Left eye exhibits no discharge.  Neck: Normal range of motion. Neck supple.  No neck masses or tenderness.  Cardiovascular: Normal rate and regular rhythm.   Pulmonary/Chest: Effort normal and breath sounds normal. No respiratory distress. She has no wheezes.  Abdominal: Soft. She exhibits no distension. There is no tenderness.  Lymphadenopathy:    She has no cervical adenopathy.  Neurological: She is alert. Coordination normal.  Skin: Skin is warm and dry. She is not diaphoretic.  Nursing note and vitals reviewed.  ED Course  Dental Date/Time: 03/03/2015 1:15 PM Performed by: Al Corpus Authorized by: Al Corpus Consent: Verbal consent obtained. Risks and benefits: risks, benefits and alternatives were discussed Consent given by: patient Patient understanding: patient states understanding of the procedure being performed Patient consent: the patient's understanding of the procedure matches consent given Patient identity confirmed: verbally with patient Local anesthesia used: yes Anesthesia: nerve block Local anesthetic: bupivacaine 0.5% with epinephrine Patient tolerance: Patient tolerated the procedure well with no immediate complications   (including critical care time)  DIAGNOSTIC STUDIES: Oxygen Saturation is 94% on RA, normal by my interpretation.    COORDINATION OF CARE: 12:34 PM - Discussed treatment plan with pt at bedside which includes dental nerve block and referral to dentist, and pt agreed to plan.     Meds given in ED:  Medications  bupivacaine-epinephrine (MARCAINE W/ EPI) 0.5% -1:200000 injection 1.8 mL (1.8 mLs Infiltration Given 03/03/15 1323)  clindamycin (CLEOCIN) capsule 300 mg (300 mg Oral Given 03/03/15 1323)    Discharge  Medication List as of 03/03/2015  1:46 PM    START taking these medications   Details  clindamycin (CLEOCIN) 300 MG capsule Take 1 capsule (300 mg total) by mouth 4 (four) times daily. X 7 days, Starting 03/03/2015, Until Discontinued, Print          MDM   Final diagnoses:  Pain, dental   Patient with chronic toothache.  No gross abscess.  Exam unconcerning for Ludwig's angina or spread of infection.  Will treat with clindamycin, dental block and NSAIDs. Urged patient to follow-up with dentist.  ED resources provided. Pt's dental pain is chronic and pt with history of opioid dependence last year. No narcotics provided.  Discussed return precautions with patient. Discussed all results and patient verbalizes understanding and agrees with plan.  I personally performed the services described in this documentation, which was scribed in my presence. The recorded information has been reviewed and is accurate.    Al Corpus, PA-C 03/03/15 [place], MD 03/05/15 [phone]

## 2015-03-03 NOTE — Discharge Instructions (Signed)
Return to the emergency room with worsening of symptoms, new symptoms or with symptoms that are concerning, especially fevers, drooling, unable to open mouth, chest pain, shortness of breath. Please take all of your antibiotics until finished!   You may develop abdominal discomfort or diarrhea from the antibiotic.  You may help offset this with probiotics which you can buy or get in yogurt. Do not eat  or take the probiotics until 2 hours after your antibiotic.  Ibuprofen 400mg  (2 tablets 200mg ) every 5-6 hours for 3-5 days. Follow up with dentist as soon as possible. Use below resources to establish care or call dentist above. Read below information and follow recommendations.  Dental Pain A tooth ache may be caused by cavities (tooth decay). Cavities expose the nerve of the tooth to air and hot or cold temperatures. It may come from an infection or abscess (also called a boil or furuncle) around your tooth. It is also often caused by dental caries (tooth decay). This causes the pain you are having. DIAGNOSIS  Your caregiver can diagnose this problem by exam. TREATMENT   If caused by an infection, it may be treated with medications which kill germs (antibiotics) and pain medications as prescribed by your caregiver. Take medications as directed.  Only take over-the-counter or prescription medicines for pain, discomfort, or fever as directed by your caregiver.  Whether the tooth ache today is caused by infection or dental disease, you should see your dentist as soon as possible for further care. SEEK MEDICAL CARE IF: The exam and treatment you received today has been provided on an emergency basis only. This is not a substitute for complete medical or dental care. If your problem worsens or new problems (symptoms) appear, and you are unable to meet with your dentist, call or return to this location. SEEK IMMEDIATE MEDICAL CARE IF:   You have a fever.  You develop redness and swelling of your  face, jaw, or neck.  You are unable to open your mouth.  You have severe pain uncontrolled by pain medicine. MAKE SURE YOU:   Understand these instructions.  Will watch your condition.  Will get help right away if you are not doing well or get worse. Document Released: 09/14/2005 Document Revised: 12/07/2011 Document Reviewed: 05/02/2008 [facility] Patient Information 2015 [employer], [place]. This information is not intended to replace advice given to you by your health care provider. Make sure you discuss any questions you have with your health care provider.

## 2015-03-03 NOTE — ED Notes (Signed)
Declined W/C at D/C and was escorted to lobby by RN. 

## 2015-05-09 ENCOUNTER — Emergency Department (HOSPITAL_COMMUNITY): Payer: Medicaid Other

## 2015-05-09 ENCOUNTER — Emergency Department (HOSPITAL_COMMUNITY)
Admission: EM | Admit: 2015-05-09 | Discharge: 2015-05-10 | Disposition: A | Discharge status: Home / Self Care | Payer: Self-pay | Attending: Emergency Medicine | Admitting: Emergency Medicine

## 2015-05-09 ENCOUNTER — Encounter (HOSPITAL_COMMUNITY): Payer: Self-pay | Admitting: *Deleted

## 2015-05-09 DIAGNOSIS — E669 Obesity, unspecified: Secondary | ICD-10-CM | POA: Insufficient documentation

## 2015-05-09 DIAGNOSIS — Z720 Tobacco use: Secondary | ICD-10-CM | POA: Insufficient documentation

## 2015-05-09 DIAGNOSIS — D72829 Elevated white blood cell count, unspecified: Secondary | ICD-10-CM | POA: Insufficient documentation

## 2015-05-09 DIAGNOSIS — D649 Anemia, unspecified: Secondary | ICD-10-CM | POA: Insufficient documentation

## 2015-05-09 DIAGNOSIS — Z3202 Encounter for pregnancy test, result negative: Secondary | ICD-10-CM | POA: Insufficient documentation

## 2015-05-09 DIAGNOSIS — K449 Diaphragmatic hernia without obstruction or gangrene: Secondary | ICD-10-CM | POA: Insufficient documentation

## 2015-05-09 DIAGNOSIS — Z880 Allergy status to penicillin: Secondary | ICD-10-CM | POA: Insufficient documentation

## 2015-05-09 DIAGNOSIS — F419 Anxiety disorder, unspecified: Secondary | ICD-10-CM | POA: Insufficient documentation

## 2015-05-09 DIAGNOSIS — E119 Type 2 diabetes mellitus without complications: Secondary | ICD-10-CM | POA: Insufficient documentation

## 2015-05-09 DIAGNOSIS — R110 Nausea: Secondary | ICD-10-CM

## 2015-05-09 DIAGNOSIS — R1013 Epigastric pain: Secondary | ICD-10-CM

## 2015-05-09 DIAGNOSIS — F319 Bipolar disorder, unspecified: Secondary | ICD-10-CM | POA: Insufficient documentation

## 2015-05-09 DIAGNOSIS — E780 Pure hypercholesterolemia: Secondary | ICD-10-CM | POA: Insufficient documentation

## 2015-05-09 DIAGNOSIS — Z79899 Other long term (current) drug therapy: Secondary | ICD-10-CM | POA: Insufficient documentation

## 2015-05-09 DIAGNOSIS — J449 Chronic obstructive pulmonary disease, unspecified: Secondary | ICD-10-CM | POA: Insufficient documentation

## 2015-05-09 DIAGNOSIS — Z8701 Personal history of pneumonia (recurrent): Secondary | ICD-10-CM | POA: Insufficient documentation

## 2015-05-09 DIAGNOSIS — G43909 Migraine, unspecified, not intractable, without status migrainosus: Secondary | ICD-10-CM | POA: Insufficient documentation

## 2015-05-09 LAB — URINALYSIS, ROUTINE W REFLEX MICROSCOPIC
Bilirubin Urine: NEGATIVE
Glucose, UA: NEGATIVE mg/dL
Hgb urine dipstick: NEGATIVE
KETONES UR: NEGATIVE mg/dL
LEUKOCYTES UA: NEGATIVE
NITRITE: NEGATIVE
PROTEIN: NEGATIVE mg/dL
Specific Gravity, Urine: 1.028 (ref 1.005–1.030)
Urobilinogen, UA: 0.2 mg/dL (ref 0.0–1.0)
pH: 5.5 (ref 5.0–8.0)

## 2015-05-09 LAB — CBC WITH DIFFERENTIAL/PLATELET
BASOS ABS: 0 10*3/uL (ref 0.0–0.1)
Basophils Relative: 0 % (ref 0–1)
Eosinophils Absolute: 0.2 10*3/uL (ref 0.0–0.7)
Eosinophils Relative: 2 % (ref 0–5)
HCT: 38.6 % (ref 36.0–46.0)
Hemoglobin: 12.3 g/dL (ref 12.0–15.0)
LYMPHS PCT: 25 % (ref 12–46)
Lymphs Abs: 3.5 10*3/uL (ref 0.7–4.0)
MCH: 24.5 pg — ABNORMAL LOW (ref 26.0–34.0)
MCHC: 31.9 g/dL (ref 30.0–36.0)
MCV: 76.7 fL — ABNORMAL LOW (ref 78.0–100.0)
Monocytes Absolute: 0.7 10*3/uL (ref 0.1–1.0)
Monocytes Relative: 5 % (ref 3–12)
NEUTROS PCT: 68 % (ref 43–77)
Neutro Abs: 9.8 10*3/uL — ABNORMAL HIGH (ref 1.7–7.7)
PLATELETS: 386 10*3/uL (ref 150–400)
RBC: 5.03 MIL/uL (ref 3.87–5.11)
RDW: 17.3 % — ABNORMAL HIGH (ref 11.5–15.5)
WBC: 14.2 10*3/uL — AB (ref 4.0–10.5)

## 2015-05-09 LAB — COMPREHENSIVE METABOLIC PANEL
ALT: 14 U/L (ref 14–54)
AST: 23 U/L (ref 15–41)
Albumin: 3.7 g/dL (ref 3.5–5.0)
Alkaline Phosphatase: 82 U/L (ref 38–126)
Anion gap: 12 (ref 5–15)
BILIRUBIN TOTAL: 0.3 mg/dL (ref 0.3–1.2)
BUN: 8 mg/dL (ref 6–20)
CO2: 18 mmol/L — AB (ref 22–32)
CREATININE: 1.01 mg/dL — AB (ref 0.44–1.00)
Calcium: 9.1 mg/dL (ref 8.9–10.3)
Chloride: 108 mmol/L (ref 101–111)
GFR calc non Af Amer: >60 mL/min (ref >60)
Glucose, Bld: 139 mg/dL — ABNORMAL HIGH (ref 65–99)
POTASSIUM: 4.1 mmol/L (ref 3.5–5.1)
Sodium: 138 mmol/L (ref 135–145)
Total Protein: 6.5 g/dL (ref 6.5–8.1)

## 2015-05-09 LAB — POC URINE PREG, ED: Preg Test, Ur: NEGATIVE

## 2015-05-09 LAB — I-STAT CG4 LACTIC ACID, ED: Lactic Acid, Venous: 3.38 mmol/L (ref 0.5–2.0)

## 2015-05-09 LAB — LIPASE, BLOOD: LIPASE: 37 U/L (ref 22–51)

## 2015-05-09 MED ORDER — ONDANSETRON HCL 4 MG/2ML IJ SOLN
4.0000 mg | Freq: Once | INTRAMUSCULAR | Status: AC
Start: 2015-05-09 — End: 2015-05-09
  Administered 2015-05-09: 4 mg via INTRAVENOUS
  Filled 2015-05-09: qty 2

## 2015-05-09 MED ORDER — IOHEXOL 300 MG/ML  SOLN
80.0000 mL | Freq: Once | INTRAMUSCULAR | Status: AC | PRN
Start: 2015-05-09 — End: 2015-05-09
  Administered 2015-05-09: 100 mL via INTRAVENOUS

## 2015-05-09 MED ORDER — MORPHINE SULFATE 4 MG/ML IJ SOLN
4.0000 mg | Freq: Once | INTRAMUSCULAR | Status: AC
  Administered 2015-05-09: 4 mg via INTRAVENOUS
  Filled 2015-05-09: qty 1

## 2015-05-09 MED ORDER — IOHEXOL 300 MG/ML  SOLN: 25.0000 mL | Freq: Once | INTRAMUSCULAR | Status: DC | PRN

## 2015-05-09 MED ORDER — HYDROMORPHONE HCL 1 MG/ML IJ SOLN
1.0000 mg | Freq: Once | INTRAMUSCULAR | Status: AC
  Administered 2015-05-09: 1 mg via INTRAVENOUS
  Filled 2015-05-09: qty 1

## 2015-05-09 MED ORDER — SODIUM CHLORIDE 0.9 % IV BOLUS (SEPSIS)
1000.0000 mL | Freq: Once | INTRAVENOUS | Status: AC
  Administered 2015-05-09: 1000 mL via INTRAVENOUS

## 2015-05-09 NOTE — ED Notes (Signed)
Pt reports flank and abdominal pain for several days with scant urination, also reports urethral pressure. Pt states she feels like she has to void but does not. Bladder scan completed, observed. Pt also reports 1 episode of diarrhea this morning that looked like "grease" as well as nausea related to the pain. Pt has hx of pancreatitis, appendectomy and choly.

## 2015-05-09 NOTE — ED Notes (Signed)
Patient presents with c/o lower back pain that has been traveling around to the lower abd.  Denies urinary symptoms states she hasn't been urinating as much as usual

## 2015-05-09 NOTE — ED Notes (Signed)
Pt states she is supposed to be on 2L [place] all the time but is non compliant.

## 2015-05-09 NOTE — ED Provider Notes (Signed)
CSN: [id number]     Arrival date & time 05/09/15  1952 History   First MD Initiated Contact with Patient 05/09/15 2044     Chief Complaint  Patient presents with  . Back Pain  . Abdominal Pain     (Consider location/radiation/quality/duration/timing/severity/associated sxs/prior Treatment) Patient is a 39 y.o. female presenting with abdominal pain. The history is provided by the patient and medical records. No language interpreter was used.  Abdominal Pain Associated symptoms: diarrhea (one episode earlier today) and nausea   Associated symptoms: no chest pain, no chills, no cough, no dysuria, no fever, no shortness of breath, no vaginal discharge and no vomiting   Patient is a 39 year old female past medical history of diarrhea, COPD, diabetes on metformin who presents today with 2-3 days of epigastric pain. Patient relates that this is been a gradual onset of illness that has gradually worsened over the last 3 days. She'll A she's felt nauseated with this but denies any vomiting. She relates she had one episode of watery diarrhea this morning. She denies any blood in her emesis or blood in her diarrhea. She denies any chest pain but does relate she feels short of breath and her pain in her stomach it's bad enough. She denies any fevers or chills with this. She denies any hematuria or dysuria although she does endorse having increased urinary frequency. She relates that she has tried over-the-counter pain medications without any improvement in her pain symptoms.  Past Medical History  Diagnosis Date  . Bipolar 1 disorder   . Migraines   . Wears dentures     upper denture  . Adenotonsillar hypertrophy 03/2012    snores during sleep; denies apnea; states occ. wakes up coughing  . COPD (chronic obstructive pulmonary disease)   . Anemia   . Depression   . Anxiety   . Obesity   . Hypercholesterolemia   . Pneumonia   . Pancreatitis   . Diabetes mellitus without complication    Past  Surgical History  Procedure Laterality Date  . Cesarean section  08/31/2001    total of  3  . Appendectomy    . Cholecystectomy    . Tubal ligation  08/31/2001  . Dilation and evacuation  04/09/2000  . Tonsillectomy and adenoidectomy  04/12/2012    Procedure: TONSILLECTOMY AND ADENOIDECTOMY;  Surgeon: Ascencion Dike, MD;  Location: [facility];  Service: ENT;  Laterality: Bilateral;  . Tonsillectomy     Family History  Problem Relation Age of Onset  . Cancer Other   . Hypertension Mother   . Heart disease Mother   . Cancer Mother   . Cancer Maternal Aunt   . Cancer Maternal Uncle     lung cancer   . Stroke Maternal Grandfather    Social History  Substance Use Topics  . Smoking status: Current Some Day Smoker -- 0.33 packs/day for 18 years    Types: Cigarettes  . Smokeless tobacco: Never Used  . Alcohol Use: 0.0 oz/week    0 Standard drinks or equivalent per week     Comment: occasionally   OB History    Gravida Para Term Preterm AB TAB SAB Ectopic Multiple Living   _0 Review of Systems  Constitutional: Negative for fever and chills.  HENT: Negative for congestion and rhinorrhea.   Eyes: Negative for photophobia and visual disturbance.  Respiratory: Negative for cough and shortness of breath.  Cardiovascular: Negative for chest pain.  Gastrointestinal: Positive for nausea, abdominal pain (epigastric) and diarrhea (one episode earlier today). Negative for vomiting and blood in stool.  Genitourinary: Negative for dysuria, vaginal discharge, difficulty urinating and vaginal pain.  Musculoskeletal: Negative for myalgias and back pain.  Skin: Negative for pallor and rash.  Neurological: Negative for light-headedness and numbness.  Psychiatric/Behavioral: Negative for confusion and agitation.  All other systems reviewed and are negative.     Allergies  Penicillins  Home Medications   Prior to Admission medications   Medication Sig Start  Date End Date Taking? Authorizing Provider  Blood Glucose Monitoring Suppl (BLOOD GLUCOSE METER KIT AND SUPPLIES) Dispense based on patient and insurance preference. Use to check sugar two times a day (fasting in am and at bedtime). (FOR ICD-9 250.00, 250.01). Please provide the glucometer with cheapest strips 08/21/14   Barton Dubois, MD  budesonide-formoterol [facility]) 160-4.5 MCG/ACT inhaler Inhale 2 puffs into the lungs 2 (two) times daily. 08/21/14   Barton Dubois, MD  clindamycin (CLEOCIN) 300 MG capsule Take 1 capsule (300 mg total) by mouth 4 (four) times daily. X 7 days 03/03/15   Al Corpus, PA-C  ferrous sulfate 325 (65 FE) MG tablet Take 1 tablet (325 mg total) by mouth daily with breakfast. 01/22/15   Janece Canterbury, MD  FLUoxetine (PROZAC) 20 MG capsule Take 1 capsule (20 mg total) by mouth daily. 05/27/14   Niel Hummer, NP  furosemide (LASIX) 20 MG tablet Take 1 tablet (20 mg total) by mouth 2 (two) times daily. 01/22/15   Janece Canterbury, MD  gabapentin (NEURONTIN) 100 MG capsule Take 2 capsules (200 mg total) by mouth 3 (three) times daily. 05/27/14   Niel Hummer, NP  guaiFENesin (MUCINEX) 600 MG 12 hr tablet Take 1 tablet (600 mg total) by mouth 2 (two) times daily. 08/21/14   Barton Dubois, MD  HYDROcodone-acetaminophen (NORCO) 5-325 MG per tablet Take 1 tablet by mouth every 4 (four) hours as needed for moderate pain or severe pain. 05/10/15   Theodosia Quay, MD  hydrOXYzine (ATARAX/VISTARIL) 25 MG tablet Take 1 tablet (25 mg total) by mouth every 6 (six) hours as needed for anxiety. 05/27/14   Niel Hummer, NP  lamoTRIgine (LAMICTAL) 25 MG tablet Take 1 tablet (25 mg total) by mouth daily. 05/27/14   Niel Hummer, NP  loratadine (CLARITIN) 10 MG tablet Take 10 mg by mouth daily.    Historical Provider, MD  metFORMIN (GLUCOPHAGE) 500 MG tablet Take 1 tablet (500 mg total) by mouth 2 (two) times daily with a meal. Patient not taking: Reported on 01/10/2015 08/21/14   Barton Dubois,  MD  omeprazole (PRILOSEC) 20 MG capsule Take 1 capsule (20 mg total) by mouth daily. 01/22/15   Janece Canterbury, MD  potassium chloride SA (K-DUR,KLOR-CON) 20 MEQ tablet Take 1 tablet (20 mEq total) by mouth daily. 01/22/15   Janece Canterbury, MD  sulfamethoxazole-trimethoprim (BACTRIM DS) 800-160 MG per tablet Take 1 tablet by mouth 2 (two) times daily. 02/07/15   Johnn Hai, PA-C  tiotropium (SPIRIVA) 18 MCG inhalation capsule Place 1 capsule (18 mcg total) into inhaler and inhale daily. Patient not taking: Reported on 01/10/2015 05/27/14   Niel Hummer, NP   BP 126/70 mmHg  Pulse 78  Temp(Src) 98.2 F (36.8 C) (Oral)  Resp 15  Ht _0  (1.575 m)  Wt 217 lb (98.431 kg)  BMI 39.68 kg/m2  SpO2 96%  LMP 05/02/2015 Physical Exam  Constitutional: She  is oriented to person, place, and time. She appears well-developed and well-nourished. No distress.  HENT:  Head: Normocephalic.  Eyes: Conjunctivae and EOM are normal.  Neck: Normal range of motion. Neck supple.  Cardiovascular: Normal rate and regular rhythm.   Pulmonary/Chest: Effort normal and breath sounds normal. No respiratory distress.  Abdominal: Soft. She exhibits no distension. There is tenderness (mild epigastric tenderness to palpation).  Hyperactive bowel sounds  Musculoskeletal: Normal range of motion. She exhibits no tenderness.  Neurological: She is alert and oriented to person, place, and time.  Skin: Skin is warm and dry. She is not diaphoretic.  Psychiatric: She has a normal mood and affect. Her behavior is normal.  Nursing note and vitals reviewed.   ED Course  Procedures (including critical care time) Labs Review Labs Reviewed  COMPREHENSIVE METABOLIC PANEL - Abnormal; Notable for the following:    CO2 18 (*)    Glucose, Bld 139 (*)    Creatinine, Ser 1.01 (*)    All other components within normal limits  CBC WITH DIFFERENTIAL/PLATELET - Abnormal; Notable for the following:    WBC 14.2 (*)    MCV 76.7 (*)     MCH 24.5 (*)    RDW 17.3 (*)    Neutro Abs 9.8 (*)    All other components within normal limits  I-STAT CG4 LACTIC ACID, ED - Abnormal; Notable for the following:    Lactic Acid, Venous 3.38 (*)    All other components within normal limits  URINALYSIS, ROUTINE W REFLEX MICROSCOPIC (NOT AT [facility])  LIPASE, BLOOD  POC URINE PREG, ED  I-STAT CG4 LACTIC ACID, ED    Imaging Review Ct Abdomen Pelvis W Contrast  05/09/2015   CLINICAL DATA:  Acute onset of epigastric abdominal pain, nausea, vomiting and diarrhea. Initial encounter.  EXAM: CT ABDOMEN AND PELVIS WITH CONTRAST  TECHNIQUE: Multidetector CT imaging of the abdomen and pelvis was performed using the standard protocol following bolus administration of intravenous contrast.  CONTRAST:  156mL OMNIPAQUE IOHEXOL 300 MG/ML  SOLN  COMPARISON:  CT of the abdomen and pelvis performed 01/19/2015  FINDINGS: The visualized lung bases are clear. There is a very large paraesophageal hernia, containing the entirety of the stomach and the splenic flexure of the colon. The stomach is filled with contrast and air. Minimal soft tissue inflammation is noted about the neck of the hernia. This is relatively similar to the prior study, aside from increased contrast and air within the herniated stomach.  The liver and spleen are unremarkable in appearance. The patient is status post cholecystectomy, with clips noted at the gallbladder fossa. Vague hypoattenuation about the gallbladder fossa is thought to be postoperative in nature. The pancreas and adrenal glands are unremarkable.  The kidneys are unremarkable in appearance. There is no evidence of hydronephrosis. No renal or ureteral stones are seen. No perinephric stranding is appreciated.  No free fluid is identified. The small bowel is unremarkable in appearance. The stomach is within normal limits. No acute vascular abnormalities are seen.  The patient is status post appendectomy. As described above, there is  herniation of the splenic flexure of the colon into the large paraesophageal hernia, with marked focal narrowing at the entrance and exit from the hernia. The colon is unremarkable in appearance.  The bladder is mildly distended and grossly unremarkable. The uterus is within normal limits. The ovaries are grossly symmetric. No suspicious adnexal masses are seen. No inguinal lymphadenopathy is seen.  No acute osseous abnormalities are identified.  IMPRESSION: Very large paraesophageal hernia, containing the entirety of the stomach and the splenic flexure of the colon. The stomach is filled with contrast and air. Minimal soft tissue inflammation about the neck of the hernia. This is relatively similar to the prior study, aside from increased contrast and air in the herniated stomach. This likely corresponds to the patient's epigastric symptoms.  No definite evidence of obstruction, as a small amount of contrast is seen within the visualized small bowel.  These results were called by telephone at the time of interpretation on 05/09/2015 at 11:36 pm to Dr. Theodosia Quay, who verbally acknowledged these results.   Electronically Signed   By: Garald Balding M.D.   On: 05/09/2015 23:37   I, Theodosia Quay, personally reviewed and evaluated these images and lab results as part of my medical decision-making.   EKG Interpretation   Date/Time:  Thursday May 09 2015 22:20:49 EDT Ventricular Rate:  85 PR Interval:  116 QRS Duration: 90 QT Interval:  365 QTC Calculation: 434 R Axis:   73 Text Interpretation:  Sinus rhythm Borderline short PR interval No  significant change since last tracing Confirmed by Debby Freiberg [phone])  on 05/09/2015 11:35:02 PM      MDM   Final diagnoses:  Epigastric pain  Nausea in adult  Leukocytosis  Paraesophageal hiatal hernia    Patient is a 39 year old female past medical history of diarrhea, COPD, diabetes on metformin who presents today with 2-3 days of epigastric  pain.  -Abdomen is tender in the epigastrium. Patient is afebrile and vital signs are stable. IV pain medication given for pain control here in the emergency department. Obtained a CT abdomen and pelvis given the patient's significant pain and also noted elevated white blood cell count. In addition at this labs notable for elevated lactate and negative lipase. Patient does have a history of pancreatitis however given the negative lipase feel this is unlikely. Patient also has had cholecystectomy in the past as well. Bowel sounds are present and patient has been having normal bowel movements therefore I doubt this SBO. CT abdomen and pelvis did show a large paraesophageal hernia that is unchanged from prior imaging in the past. The inflammatory changes around the neck of the hernia. Discussed with Dr. Barry Dienes with surgery who relates at this point surgical intervention is not indicated given the inflammation and patient would need to wait to this inflammation resolves before consideration of surgical management. After getting 1 L of fluid patient's lactate did notably clear. Given improvement in pain and also cleared lactate we'll discharge the patient home with surgery contact number to call in the next few days to discuss further management. Patient was discharged home in good condition.    Theodosia Quay, MD 05/10/15 1615  Debby Freiberg, MD 05/10/15 [phone]

## 2015-05-10 LAB — I-STAT CG4 LACTIC ACID, ED: Lactic Acid, Venous: 0.71 mmol/L (ref 0.5–2.0)

## 2015-05-10 MED ORDER — HYDROCODONE-ACETAMINOPHEN 5-325 MG PO TABS
1.0000 | ORAL_TABLET | Freq: Once | ORAL | Status: AC
  Administered 2015-05-10: 1 via ORAL
  Filled 2015-05-10: qty 1

## 2015-05-10 MED ORDER — HYDROCODONE-ACETAMINOPHEN 5-325 MG PO TABS: 1.0000 | ORAL_TABLET | ORAL | Status: DC | PRN

## 2015-05-10 NOTE — Discharge Instructions (Signed)

## 2015-05-13 ENCOUNTER — Emergency Department (HOSPITAL_COMMUNITY)
Admission: EM | Admit: 2015-05-13 | Discharge: 2015-05-13 | Disposition: A | Discharge status: Home / Self Care | Payer: Medicaid Other | Attending: Emergency Medicine | Admitting: Emergency Medicine

## 2015-05-13 ENCOUNTER — Encounter (HOSPITAL_COMMUNITY): Payer: Self-pay | Admitting: *Deleted

## 2015-05-13 DIAGNOSIS — J449 Chronic obstructive pulmonary disease, unspecified: Secondary | ICD-10-CM | POA: Insufficient documentation

## 2015-05-13 DIAGNOSIS — E669 Obesity, unspecified: Secondary | ICD-10-CM | POA: Insufficient documentation

## 2015-05-13 DIAGNOSIS — K859 Acute pancreatitis without necrosis or infection, unspecified: Secondary | ICD-10-CM

## 2015-05-13 DIAGNOSIS — Z880 Allergy status to penicillin: Secondary | ICD-10-CM | POA: Insufficient documentation

## 2015-05-13 DIAGNOSIS — IMO0002 Reserved for concepts with insufficient information to code with codable children: Secondary | ICD-10-CM

## 2015-05-13 DIAGNOSIS — F419 Anxiety disorder, unspecified: Secondary | ICD-10-CM | POA: Insufficient documentation

## 2015-05-13 DIAGNOSIS — Z792 Long term (current) use of antibiotics: Secondary | ICD-10-CM | POA: Insufficient documentation

## 2015-05-13 DIAGNOSIS — R079 Chest pain, unspecified: Secondary | ICD-10-CM | POA: Insufficient documentation

## 2015-05-13 DIAGNOSIS — Z8701 Personal history of pneumonia (recurrent): Secondary | ICD-10-CM | POA: Insufficient documentation

## 2015-05-13 DIAGNOSIS — Z3202 Encounter for pregnancy test, result negative: Secondary | ICD-10-CM | POA: Insufficient documentation

## 2015-05-13 DIAGNOSIS — D649 Anemia, unspecified: Secondary | ICD-10-CM | POA: Insufficient documentation

## 2015-05-13 DIAGNOSIS — F319 Bipolar disorder, unspecified: Secondary | ICD-10-CM | POA: Insufficient documentation

## 2015-05-13 DIAGNOSIS — Z720 Tobacco use: Secondary | ICD-10-CM | POA: Insufficient documentation

## 2015-05-13 DIAGNOSIS — G43909 Migraine, unspecified, not intractable, without status migrainosus: Secondary | ICD-10-CM | POA: Insufficient documentation

## 2015-05-13 DIAGNOSIS — Z79899 Other long term (current) drug therapy: Secondary | ICD-10-CM | POA: Insufficient documentation

## 2015-05-13 DIAGNOSIS — K861 Other chronic pancreatitis: Secondary | ICD-10-CM | POA: Insufficient documentation

## 2015-05-13 DIAGNOSIS — E119 Type 2 diabetes mellitus without complications: Secondary | ICD-10-CM | POA: Insufficient documentation

## 2015-05-13 LAB — COMPREHENSIVE METABOLIC PANEL
ALT: 15 U/L (ref 14–54)
AST: 23 U/L (ref 15–41)
Albumin: 3.8 g/dL (ref 3.5–5.0)
Alkaline Phosphatase: 83 U/L (ref 38–126)
Anion gap: 10 (ref 5–15)
BILIRUBIN TOTAL: 0.5 mg/dL (ref 0.3–1.2)
BUN: 7 mg/dL (ref 6–20)
CHLORIDE: 105 mmol/L (ref 101–111)
CO2: 23 mmol/L (ref 22–32)
Calcium: 9.8 mg/dL (ref 8.9–10.3)
Creatinine, Ser: 0.99 mg/dL (ref 0.44–1.00)
GFR calc Af Amer: >60 mL/min (ref >60)
Glucose, Bld: 139 mg/dL — ABNORMAL HIGH (ref 65–99)
Potassium: 4 mmol/L (ref 3.5–5.1)
Sodium: 138 mmol/L (ref 135–145)
TOTAL PROTEIN: 7.2 g/dL (ref 6.5–8.1)

## 2015-05-13 LAB — URINE MICROSCOPIC-ADD ON

## 2015-05-13 LAB — LIPASE, BLOOD: Lipase: 93 U/L — ABNORMAL HIGH (ref 22–51)

## 2015-05-13 LAB — URINALYSIS, ROUTINE W REFLEX MICROSCOPIC
BILIRUBIN URINE: NEGATIVE
Glucose, UA: NEGATIVE mg/dL
KETONES UR: NEGATIVE mg/dL
NITRITE: NEGATIVE
PH: 6 (ref 5.0–8.0)
Protein, ur: 30 mg/dL — AB
Specific Gravity, Urine: 1.037 — ABNORMAL HIGH (ref 1.005–1.030)
Urobilinogen, UA: 0.2 mg/dL (ref 0.0–1.0)

## 2015-05-13 LAB — CBC
HCT: 40.5 % (ref 36.0–46.0)
Hemoglobin: 13.2 g/dL (ref 12.0–15.0)
MCH: 25 pg — ABNORMAL LOW (ref 26.0–34.0)
MCHC: 32.6 g/dL (ref 30.0–36.0)
MCV: 76.9 fL — AB (ref 78.0–100.0)
PLATELETS: 354 10*3/uL (ref 150–400)
RBC: 5.27 MIL/uL — ABNORMAL HIGH (ref 3.87–5.11)
RDW: 17.2 % — AB (ref 11.5–15.5)
WBC: 11.7 10*3/uL — AB (ref 4.0–10.5)

## 2015-05-13 LAB — I-STAT CG4 LACTIC ACID, ED: LACTIC ACID, VENOUS: 0.56 mmol/L (ref 0.5–2.0)

## 2015-05-13 LAB — POC URINE PREG, ED: Preg Test, Ur: NEGATIVE

## 2015-05-13 LAB — TROPONIN I

## 2015-05-13 MED ORDER — HYDROCODONE-ACETAMINOPHEN 5-325 MG PO TABS
1.0000 | ORAL_TABLET | Freq: Once | ORAL | Status: AC
  Administered 2015-05-13: 1 via ORAL

## 2015-05-13 MED ORDER — SODIUM CHLORIDE 0.9 % IV BOLUS (SEPSIS)
1000.0000 mL | Freq: Once | INTRAVENOUS | Status: AC
  Administered 2015-05-13: 1000 mL via INTRAVENOUS

## 2015-05-13 MED ORDER — HYDROCODONE-ACETAMINOPHEN 5-325 MG PO TABS
ORAL_TABLET | ORAL | Status: AC
  Filled 2015-05-13: qty 1

## 2015-05-13 MED ORDER — MORPHINE SULFATE (PF) 4 MG/ML IV SOLN
8.0000 mg | Freq: Once | INTRAVENOUS | Status: AC
  Administered 2015-05-13: 8 mg via INTRAVENOUS
  Filled 2015-05-13: qty 2

## 2015-05-13 MED ORDER — ONDANSETRON HCL 4 MG/2ML IJ SOLN
4.0000 mg | Freq: Once | INTRAMUSCULAR | Status: AC
  Administered 2015-05-13: 4 mg via INTRAVENOUS
  Filled 2015-05-13: qty 2

## 2015-05-13 MED ORDER — ONDANSETRON 4 MG PO TBDP: 4.0000 mg | ORAL_TABLET | Freq: Three times a day (TID) | ORAL | Status: DC | PRN

## 2015-05-13 MED ORDER — HYDROCODONE-ACETAMINOPHEN 5-325 MG PO TABS: 1.0000 | ORAL_TABLET | ORAL | Status: DC | PRN

## 2015-05-13 NOTE — ED Notes (Signed)
Reviewed discharge instructions/prescriptions with patient. Understanding verbalized. Upper abdominal pain remains 5/10, but improved from earlier. Patient refused wheelchair at time of discharge. No acute distress noted at discharge.

## 2015-05-13 NOTE — Care Management (Signed)
ED CM noted patient to have had 6 ED visits and 1 hospitalization in the past  6 months. Patient states, that she does not have health insurance. Discussed the [facility] and the services rendered.  Patient agreeable with re-establishing care at the clinic. Scheduled appointment with Dr. Armen Pickup 8/31 for medication refill. Patient encouraged to keep appointment, if unable to keep appointment call at least 24 hours in advance to cancel, patient verbalized understanding and appreciation for assisting with appointment. No further CM needs at this time.

## 2015-05-13 NOTE — Discharge Instructions (Signed)
Low-Fat Diet for Pancreatitis or Gallbladder Conditions °A low-fat diet can be helpful if you have pancreatitis or a gallbladder condition. With these conditions, your pancreas and gallbladder have trouble digesting fats. A healthy eating plan with less fat will help rest your pancreas and gallbladder and reduce your symptoms. °WHAT DO I NEED TO KNOW ABOUT THIS DIET? °· Eat a low-fat diet. °¨ Reduce your fat intake to less than 20-30% of your total daily calories. This is less than 50-60 g of fat per day. °¨ Remember that you need some fat in your diet. Ask your dietician what your daily goal should be. °¨ Choose nonfat and low-fat healthy foods. Look for the words "nonfat," "low fat," or "fat free." °¨ As a guide, look on the label and choose foods with less than 3 g of fat per serving. Eat only one serving. °· Avoid alcohol. °· Do not smoke. If you need help quitting, talk with your health care provider. °· Eat small frequent meals instead of three large heavy meals. °WHAT FOODS CAN I EAT? °Grains °Include healthy grains and starches such as potatoes, wheat bread, fiber-rich cereal, and brown rice. Choose whole grain options whenever possible. In adults, whole grains should account for 45-65% of your daily calories.  °Fruits and Vegetables °Eat plenty of fruits and vegetables. Fresh fruits and vegetables add fiber to your diet. °Meats and Other Protein Sources °Eat lean meat such as chicken and pork. Trim any fat off of meat before cooking it. Eggs, fish, and beans are other sources of protein. In adults, these foods should account for 10-35% of your daily calories. °Dairy °Choose low-fat milk and dairy options. Dairy includes fat and protein, as well as calcium.  °Fats and Oils °Limit high-fat foods such as fried foods, sweets, baked goods, sugary drinks.  °Other °Creamy sauces and condiments, such as mayonnaise, can add extra fat. Think about whether or not you need to use them, or use smaller amounts or low fat  options. °WHAT FOODS ARE NOT RECOMMENDED? °· High fat foods, such as: °¨ Baked goods. °¨ Ice cream. °¨ French toast. °¨ Sweet rolls. °¨ Pizza. °¨ Cheese bread. °¨ Foods covered with batter, butter, creamy sauces, or cheese. °¨ Fried foods. °¨ Sugary drinks and desserts. °· Foods that cause gas or bloating °Document Released: 09/19/2013 Document Reviewed: 09/19/2013 °[employer]® Patient Information ©2015 [employer]. This information is not intended to replace advice given to you by your health care provider. Make sure you discuss any questions you have with your health care provider. ° °

## 2015-05-13 NOTE — ED Provider Notes (Signed)
CSN: [id number]     Arrival date & time 05/13/15  1908 History   First MD Initiated Contact with Patient 05/13/15 2102     Chief Complaint  Patient presents with  . Abdominal Pain  . Chest Pain     (Consider location/radiation/quality/duration/timing/severity/associated sxs/prior Treatment) Patient is a 39 y.o. female presenting with abdominal pain. The history is provided by the patient.  Abdominal Pain Pain location:  Epigastric Pain quality: aching and sharp   Pain radiates to:  Does not radiate Pain severity:  Moderate Onset quality:  Gradual Duration:  10 days Timing:  Intermittent Progression:  Waxing and waning Chronicity:  Recurrent Context: not alcohol use   Relieved by:  Nothing Worsened by:  Nothing tried Ineffective treatments:  None tried Associated symptoms: diarrhea, nausea and vomiting   Associated symptoms: no fever   Risk factors comment:  Paraesophageal hernia and pancreatitis previously   Past Medical History  Diagnosis Date  . Bipolar 1 disorder   . Migraines   . Wears dentures     upper denture  . Adenotonsillar hypertrophy 03/2012    snores during sleep; denies apnea; states occ. wakes up coughing  . COPD (chronic obstructive pulmonary disease)   . Anemia   . Depression   . Anxiety   . Obesity   . Hypercholesterolemia   . Pneumonia   . Pancreatitis   . Diabetes mellitus without complication    Past Surgical History  Procedure Laterality Date  . Cesarean section  08/31/2001    total of  3  . Appendectomy    . Cholecystectomy    . Tubal ligation  08/31/2001  . Dilation and evacuation  04/09/2000  . Tonsillectomy and adenoidectomy  04/12/2012    Procedure: TONSILLECTOMY AND ADENOIDECTOMY;  Surgeon: Ascencion Dike, MD;  Location: [facility];  Service: ENT;  Laterality: Bilateral;  . Tonsillectomy     Family History  Problem Relation Age of Onset  . Cancer Other   . Hypertension Mother   . Heart disease Mother   . Cancer Mother    . Cancer Maternal Aunt   . Cancer Maternal Uncle     lung cancer   . Stroke Maternal Grandfather    Social History  Substance Use Topics  . Smoking status: Current Some Day Smoker -- 0.33 packs/day for 18 years    Types: Cigarettes  . Smokeless tobacco: Never Used  . Alcohol Use: 0.0 oz/week    0 Standard drinks or equivalent per week     Comment: occasionally   OB History    Gravida Para Term Preterm AB TAB SAB Ectopic Multiple Living   '8 3 3  5  5   3     '$ Review of Systems  Constitutional: Negative for fever.  Gastrointestinal: Positive for nausea, vomiting, abdominal pain and diarrhea.  All other systems reviewed and are negative.     Allergies  Penicillins  Home Medications   Prior to Admission medications   Medication Sig Start Date End Date Taking? Authorizing Provider  Blood Glucose Monitoring Suppl (BLOOD GLUCOSE METER KIT AND SUPPLIES) Dispense based on patient and insurance preference. Use to check sugar two times a day (fasting in am and at bedtime). (FOR ICD-9 250.00, 250.01). Please provide the glucometer with cheapest strips 08/21/14   Barton Dubois, MD  budesonide-formoterol [facility]) 160-4.5 MCG/ACT inhaler Inhale 2 puffs into the lungs 2 (two) times daily. 08/21/14   Barton Dubois, MD  clindamycin (CLEOCIN) 300 MG capsule Take  1 capsule (300 mg total) by mouth 4 (four) times daily. X 7 days 03/03/15   Oswaldo Conroy, PA-C  ferrous sulfate 325 (65 FE) MG tablet Take 1 tablet (325 mg total) by mouth daily with breakfast. 01/22/15   Renae Fickle, MD  FLUoxetine (PROZAC) 20 MG capsule Take 1 capsule (20 mg total) by mouth daily. 05/27/14   Thermon Leyland, NP  furosemide (LASIX) 20 MG tablet Take 1 tablet (20 mg total) by mouth 2 (two) times daily. 01/22/15   Renae Fickle, MD  gabapentin (NEURONTIN) 100 MG capsule Take 2 capsules (200 mg total) by mouth 3 (three) times daily. 05/27/14   Thermon Leyland, NP  guaiFENesin (MUCINEX) 600 MG 12 hr tablet Take 1  tablet (600 mg total) by mouth 2 (two) times daily. 08/21/14   Vassie Loll, MD  HYDROcodone-acetaminophen (NORCO) 5-325 MG per tablet Take 1 tablet by mouth every 4 (four) hours as needed for moderate pain or severe pain. 05/10/15   Madolyn Frieze, MD  hydrOXYzine (ATARAX/VISTARIL) 25 MG tablet Take 1 tablet (25 mg total) by mouth every 6 (six) hours as needed for anxiety. 05/27/14   Thermon Leyland, NP  lamoTRIgine (LAMICTAL) 25 MG tablet Take 1 tablet (25 mg total) by mouth daily. 05/27/14   Thermon Leyland, NP  loratadine (CLARITIN) 10 MG tablet Take 10 mg by mouth daily.    Historical Provider, MD  metFORMIN (GLUCOPHAGE) 500 MG tablet Take 1 tablet (500 mg total) by mouth 2 (two) times daily with a meal. Patient not taking: Reported on 01/10/2015 08/21/14   Vassie Loll, MD  omeprazole (PRILOSEC) 20 MG capsule Take 1 capsule (20 mg total) by mouth daily. 01/22/15   Renae Fickle, MD  potassium chloride SA (K-DUR,KLOR-CON) 20 MEQ tablet Take 1 tablet (20 mEq total) by mouth daily. 01/22/15   Renae Fickle, MD  sulfamethoxazole-trimethoprim (BACTRIM DS) 800-160 MG per tablet Take 1 tablet by mouth 2 (two) times daily. 02/07/15   Tommi Rumps, PA-C  tiotropium (SPIRIVA) 18 MCG inhalation capsule Place 1 capsule (18 mcg total) into inhaler and inhale daily. Patient not taking: Reported on 01/10/2015 05/27/14   Thermon Leyland, NP   BP 117/77 mmHg  Pulse 90  Temp(Src) 98.3 F (36.8 C) (Oral)  Resp 22  Ht 5\' 2"  (1.575 m)  Wt 214 lb 6.4 oz (97.251 kg)  BMI 39.20 kg/m2  SpO2 95%  LMP 05/02/2015 Physical Exam  Constitutional: She is oriented to person, place, and time. She appears well-developed and well-nourished. No distress.  HENT:  Head: Normocephalic.  Eyes: Conjunctivae are normal.  Neck: Neck supple. No tracheal deviation present.  Cardiovascular: Normal rate and regular rhythm.   Pulmonary/Chest: Effort normal and breath sounds normal. No respiratory distress.  Abdominal: Soft. Normal  appearance. She exhibits no distension. There is tenderness in the epigastric area. There is no rigidity, no rebound, no guarding, no tenderness at McBurney's point and negative Murphy's sign.  Neurological: She is alert and oriented to person, place, and time.  Skin: Skin is warm and dry.  Psychiatric: She has a normal mood and affect.    ED Course  Procedures (including critical care time) Labs Review Labs Reviewed  LIPASE, BLOOD - Abnormal; Notable for the following:    Lipase 93 (*)    All other components within normal limits  COMPREHENSIVE METABOLIC PANEL - Abnormal; Notable for the following:    Glucose, Bld 139 (*)    All other components within normal limits  CBC - Abnormal; Notable for the following:    WBC 11.7 (*)    RBC 5.27 (*)    MCV 76.9 (*)    MCH 25.0 (*)    RDW 17.2 (*)    All other components within normal limits  URINALYSIS, ROUTINE W REFLEX MICROSCOPIC (NOT AT [facility]) - Abnormal; Notable for the following:    Color, Urine AMBER (*)    APPearance CLOUDY (*)    Specific Gravity, Urine 1.037 (*)    Hgb urine dipstick TRACE (*)    Protein, ur 30 (*)    Leukocytes, UA SMALL (*)    All other components within normal limits  URINE MICROSCOPIC-ADD ON - Abnormal; Notable for the following:    Squamous Epithelial / LPF MANY (*)    Bacteria, UA FEW (*)    All other components within normal limits  TROPONIN I  POC URINE PREG, ED  I-STAT CG4 LACTIC ACID, ED    Imaging Review No results found. Lucilla Edin, personally reviewed and evaluated these images and lab results as part of my medical decision-making.   EKG Interpretation None      MDM   Final diagnoses:  Pancreatitis, recurrent   39 year old female presents with ongoing upper abdominal pain that is radiating into her back. She is pending an appointment on an outpatient basis with surgery for a periesophageal hernia that showed signs of ischemia intermittently picked up on CT scan last week. She  has not noticed an improvement in her symptoms. Her lactate is normal today and I do not believe she has ongoing ischemic issues. She does have an elevation in her lipase that is nondiagnostic but suggestive of recurrent acute pancreatitis flare that she has had previously. She denies any alcohol use, new medications, or other risk factors. Recent imaging that was normal there is no clinical indication that she has developed necrotizing pancreatitis or other emergent issues requiring hospitalization. She has [facility] She is better following a dose of antiemetics and pain medicine parenterally here. She is status post cholecystectomy so does not have gallstone mediated pancreatitis. She was able to tolerate medication by mouth prior to discharge. Plan to follow up to establish PCP and return precautions discussed for worsening or new concerning symptoms.     Leo Grosser, MD 05/13/15 [phone]

## 2015-05-13 NOTE — ED Notes (Signed)
Patient presents with c/o pain to the upper abd area.  Seen here for the same on 8/11 and dx with paraesophageal hiatal hernia and instructed to follow up but she is waiting for them to call her back with an appointment

## 2015-05-29 ENCOUNTER — Ambulatory Visit: Payer: Self-pay | Admitting: Family Medicine

## 2015-09-29 HISTORY — PX: OTHER SURGICAL HISTORY: SHX169

## 2015-09-30 ENCOUNTER — Emergency Department (HOSPITAL_COMMUNITY): Payer: Medicaid Other

## 2015-09-30 ENCOUNTER — Emergency Department (HOSPITAL_COMMUNITY)
Admission: EM | Admit: 2015-09-30 | Discharge: 2015-09-30 | Disposition: A | Discharge status: Home / Self Care | Payer: Medicaid Other | Attending: Emergency Medicine | Admitting: Emergency Medicine

## 2015-09-30 ENCOUNTER — Encounter (HOSPITAL_COMMUNITY): Payer: Self-pay | Admitting: Emergency Medicine

## 2015-09-30 DIAGNOSIS — Z8659 Personal history of other mental and behavioral disorders: Secondary | ICD-10-CM | POA: Insufficient documentation

## 2015-09-30 DIAGNOSIS — K529 Noninfective gastroenteritis and colitis, unspecified: Secondary | ICD-10-CM | POA: Insufficient documentation

## 2015-09-30 DIAGNOSIS — Z8701 Personal history of pneumonia (recurrent): Secondary | ICD-10-CM | POA: Insufficient documentation

## 2015-09-30 DIAGNOSIS — Z792 Long term (current) use of antibiotics: Secondary | ICD-10-CM | POA: Diagnosis not present

## 2015-09-30 DIAGNOSIS — E119 Type 2 diabetes mellitus without complications: Secondary | ICD-10-CM | POA: Diagnosis not present

## 2015-09-30 DIAGNOSIS — Z880 Allergy status to penicillin: Secondary | ICD-10-CM | POA: Diagnosis not present

## 2015-09-30 DIAGNOSIS — E669 Obesity, unspecified: Secondary | ICD-10-CM | POA: Insufficient documentation

## 2015-09-30 DIAGNOSIS — K449 Diaphragmatic hernia without obstruction or gangrene: Secondary | ICD-10-CM | POA: Diagnosis not present

## 2015-09-30 DIAGNOSIS — G43909 Migraine, unspecified, not intractable, without status migrainosus: Secondary | ICD-10-CM | POA: Diagnosis not present

## 2015-09-30 DIAGNOSIS — Z9851 Tubal ligation status: Secondary | ICD-10-CM | POA: Diagnosis not present

## 2015-09-30 DIAGNOSIS — Z8719 Personal history of other diseases of the digestive system: Secondary | ICD-10-CM | POA: Insufficient documentation

## 2015-09-30 DIAGNOSIS — Z862 Personal history of diseases of the blood and blood-forming organs and certain disorders involving the immune mechanism: Secondary | ICD-10-CM | POA: Insufficient documentation

## 2015-09-30 DIAGNOSIS — J449 Chronic obstructive pulmonary disease, unspecified: Secondary | ICD-10-CM | POA: Insufficient documentation

## 2015-09-30 DIAGNOSIS — Z9049 Acquired absence of other specified parts of digestive tract: Secondary | ICD-10-CM | POA: Diagnosis not present

## 2015-09-30 DIAGNOSIS — Z9889 Other specified postprocedural states: Secondary | ICD-10-CM | POA: Diagnosis not present

## 2015-09-30 DIAGNOSIS — R1084 Generalized abdominal pain: Secondary | ICD-10-CM | POA: Diagnosis present

## 2015-09-30 DIAGNOSIS — F17210 Nicotine dependence, cigarettes, uncomplicated: Secondary | ICD-10-CM | POA: Insufficient documentation

## 2015-09-30 LAB — URINALYSIS, ROUTINE W REFLEX MICROSCOPIC
BILIRUBIN URINE: NEGATIVE
Glucose, UA: NEGATIVE mg/dL
HGB URINE DIPSTICK: NEGATIVE
KETONES UR: NEGATIVE mg/dL
Nitrite: NEGATIVE
PROTEIN: NEGATIVE mg/dL
Specific Gravity, Urine: 1.02 (ref 1.005–1.030)
pH: 8.5 — ABNORMAL HIGH (ref 5.0–8.0)

## 2015-09-30 LAB — COMPREHENSIVE METABOLIC PANEL
ALBUMIN: 3.8 g/dL (ref 3.5–5.0)
ALK PHOS: 82 U/L (ref 38–126)
ALT: 14 U/L (ref 14–54)
AST: 16 U/L (ref 15–41)
Anion gap: 7 (ref 5–15)
BILIRUBIN TOTAL: 0.2 mg/dL — AB (ref 0.3–1.2)
BUN: 5 mg/dL — ABNORMAL LOW (ref 6–20)
CALCIUM: 9.2 mg/dL (ref 8.9–10.3)
CO2: 28 mmol/L (ref 22–32)
CREATININE: 0.63 mg/dL (ref 0.44–1.00)
Chloride: 105 mmol/L (ref 101–111)
GFR calc non Af Amer: >60 mL/min (ref >60)
GLUCOSE: 111 mg/dL — AB (ref 65–99)
Potassium: 3.9 mmol/L (ref 3.5–5.1)
SODIUM: 140 mmol/L (ref 135–145)
Total Protein: 6.7 g/dL (ref 6.5–8.1)

## 2015-09-30 LAB — CBC
HCT: 38.5 % (ref 36.0–46.0)
Hemoglobin: 12.3 g/dL (ref 12.0–15.0)
MCH: 25.2 pg — AB (ref 26.0–34.0)
MCHC: 31.9 g/dL (ref 30.0–36.0)
MCV: 78.9 fL (ref 78.0–100.0)
PLATELETS: 323 10*3/uL (ref 150–400)
RBC: 4.88 MIL/uL (ref 3.87–5.11)
RDW: 15.1 % (ref 11.5–15.5)
WBC: 10.1 10*3/uL (ref 4.0–10.5)

## 2015-09-30 LAB — I-STAT BETA HCG BLOOD, ED (MC, WL, AP ONLY)

## 2015-09-30 LAB — URINE MICROSCOPIC-ADD ON

## 2015-09-30 LAB — LIPASE, BLOOD: Lipase: 21 U/L (ref 11–51)

## 2015-09-30 MED ORDER — ONDANSETRON HCL 4 MG/2ML IJ SOLN
4.0000 mg | Freq: Once | INTRAMUSCULAR | Status: AC
  Administered 2015-09-30: 4 mg via INTRAVENOUS
  Filled 2015-09-30: qty 2

## 2015-09-30 MED ORDER — HYDROCODONE-ACETAMINOPHEN 5-325 MG PO TABS: ORAL_TABLET | ORAL | Status: DC

## 2015-09-30 MED ORDER — IOHEXOL 300 MG/ML  SOLN
100.0000 mL | Freq: Once | INTRAMUSCULAR | Status: AC | PRN
  Administered 2015-09-30: 100 mL via INTRAVENOUS

## 2015-09-30 MED ORDER — ONDANSETRON 4 MG PO TBDP: ORAL_TABLET | ORAL | Status: DC

## 2015-09-30 MED ORDER — HYDROMORPHONE HCL 1 MG/ML IJ SOLN
0.5000 mg | Freq: Once | INTRAMUSCULAR | Status: AC
Start: 2015-09-30 — End: 2015-09-30
  Administered 2015-09-30: 0.5 mg via INTRAVENOUS
  Filled 2015-09-30: qty 1

## 2015-09-30 MED ORDER — SODIUM CHLORIDE 0.9 % IV BOLUS (SEPSIS)
1000.0000 mL | Freq: Once | INTRAVENOUS | Status: AC
  Administered 2015-09-30: 1000 mL via INTRAVENOUS

## 2015-09-30 MED ORDER — HYDROMORPHONE HCL 1 MG/ML IJ SOLN
0.5000 mg | Freq: Once | INTRAMUSCULAR | Status: AC
  Administered 2015-09-30: 18:00:00 via INTRAVENOUS
  Filled 2015-09-30: qty 1

## 2015-09-30 NOTE — ED Notes (Signed)
Pt reports upper abdominal pain and nausea that started on Sat.

## 2015-09-30 NOTE — ED Provider Notes (Signed)
CSN: [id number]     Arrival date & time 09/30/15  1403 History   First MD Initiated Contact with Patient 09/30/15 1545     Chief Complaint  Patient presents with  . Abdominal Pain     (Consider location/radiation/quality/duration/timing/severity/associated sxs/prior Treatment) Patient is a 40 y.o. female presenting with abdominal pain. The history is provided by the patient (Patient complains of some nausea vomiting abdominal distention and some diarrhea for 1-2 days).  Abdominal Pain Pain location:  Generalized Pain quality: aching   Pain radiates to:  Does not radiate Pain severity:  Moderate Onset quality:  Gradual Timing:  Intermittent Progression:  Waxing and waning Chronicity:  New Context: not alcohol use   Associated symptoms: diarrhea, nausea and vomiting   Associated symptoms: no chest pain, no cough, no fatigue and no hematuria     Past Medical History  Diagnosis Date  . Bipolar 1 disorder ([place])   . Migraines   . Wears dentures     upper denture  . Adenotonsillar hypertrophy 03/2012    snores during sleep; denies apnea; states occ. wakes up coughing  . COPD (chronic obstructive pulmonary disease) ([place])   . Anemia   . Depression   . Anxiety   . Obesity   . Hypercholesterolemia   . Pneumonia   . Pancreatitis   . Diabetes mellitus without complication [facility])    Past Surgical History  Procedure Laterality Date  . Cesarean section  08/31/2001    total of  3  . Appendectomy    . Cholecystectomy    . Tubal ligation  08/31/2001  . Dilation and evacuation  04/09/2000  . Tonsillectomy and adenoidectomy  04/12/2012    Procedure: TONSILLECTOMY AND ADENOIDECTOMY;  Surgeon: Ascencion Dike, MD;  Location: [place];  Service: ENT;  Laterality: Bilateral;  . Tonsillectomy     Family History  Problem Relation Age of Onset  . Cancer Other   . Hypertension Mother   . Heart disease Mother   . Cancer Mother   . Cancer Maternal Aunt   . Cancer Maternal Uncle      lung cancer   . Stroke Maternal Grandfather    Social History  Substance Use Topics  . Smoking status: Current Some Day Smoker -- 0.33 packs/day for 18 years    Types: Cigarettes  . Smokeless tobacco: Never Used  . Alcohol Use: 0.0 oz/week    0 Standard drinks or equivalent per week     Comment: occasionally   OB History    Gravida Para Term Preterm AB TAB SAB Ectopic Multiple Living   _0 Review of Systems  Constitutional: Negative for appetite change and fatigue.  HENT: Negative for congestion, ear discharge and sinus pressure.   Eyes: Negative for discharge.  Respiratory: Negative for cough.   Cardiovascular: Negative for chest pain.  Gastrointestinal: Positive for nausea, vomiting, abdominal pain and diarrhea.  Genitourinary: Negative for frequency and hematuria.  Musculoskeletal: Negative for back pain.  Skin: Negative for rash.  Neurological: Negative for seizures and headaches.  Psychiatric/Behavioral: Negative for hallucinations.      Allergies  Penicillins  Home Medications   Prior to Admission medications   Medication Sig Start Date End Date Taking? Authorizing Provider  ondansetron (ZOFRAN ODT) 4 MG disintegrating tablet Take 1 tablet (4 mg total) by mouth every 8 (eight) hours as needed for nausea or vomiting. 05/13/15  Yes Leo Grosser, MD  aspirin-acetaminophen-caffeine (EXCEDRIN MIGRAINE) 250-250-65 MG per tablet Take 2 tablets by mouth every 6 (six) hours as needed for headache.    Historical Provider, MD  Blood Glucose Monitoring Suppl (BLOOD GLUCOSE METER KIT AND SUPPLIES) Dispense based on patient and insurance preference. Use to check sugar two times a day (fasting in am and at bedtime). (FOR ICD-9 250.00, 250.01). Please provide the glucometer with cheapest strips 08/21/14   Barton Dubois, MD  budesonide-formoterol [facility]) 160-4.5 MCG/ACT inhaler Inhale 2 puffs into the lungs 2 (two) times daily. Patient not taking: Reported on  05/13/2015 08/21/14   Barton Dubois, MD  clindamycin (CLEOCIN) 300 MG capsule Take 1 capsule (300 mg total) by mouth 4 (four) times daily. X 7 days 03/03/15   Al Corpus, PA-C  ferrous sulfate 325 (65 FE) MG tablet Take 1 tablet (325 mg total) by mouth daily with breakfast. Patient not taking: Reported on 05/13/2015 01/22/15   Janece Canterbury, MD  FLUoxetine (PROZAC) 20 MG capsule Take 1 capsule (20 mg total) by mouth daily. Patient not taking: Reported on 05/13/2015 05/27/14   Niel Hummer, NP  furosemide (LASIX) 20 MG tablet Take 1 tablet (20 mg total) by mouth 2 (two) times daily. Patient not taking: Reported on 05/13/2015 01/22/15   Janece Canterbury, MD  gabapentin (NEURONTIN) 100 MG capsule Take 2 capsules (200 mg total) by mouth 3 (three) times daily. Patient not taking: Reported on 05/13/2015 05/27/14   Niel Hummer, NP  guaiFENesin (MUCINEX) 600 MG 12 hr tablet Take 1 tablet (600 mg total) by mouth 2 (two) times daily. Patient not taking: Reported on 05/13/2015 08/21/14   Barton Dubois, MD  HYDROcodone-acetaminophen (NORCO/VICODIN) 5-325 MG per tablet Take 1 tablet by mouth every 4 (four) hours as needed for moderate pain. 05/13/15   Leo Grosser, MD  hydrOXYzine (ATARAX/VISTARIL) 25 MG tablet Take 1 tablet (25 mg total) by mouth every 6 (six) hours as needed for anxiety. Patient not taking: Reported on 05/13/2015 05/27/14   Niel Hummer, NP  lamoTRIgine (LAMICTAL) 25 MG tablet Take 1 tablet (25 mg total) by mouth daily. Patient not taking: Reported on 05/13/2015 05/27/14   Niel Hummer, NP  loratadine (CLARITIN) 10 MG tablet Take 10 mg by mouth daily.    Historical Provider, MD  metFORMIN (GLUCOPHAGE) 500 MG tablet Take 1 tablet (500 mg total) by mouth 2 (two) times daily with a meal. Patient not taking: Reported on 01/10/2015 08/21/14   Barton Dubois, MD  omeprazole (PRILOSEC) 20 MG capsule Take 1 capsule (20 mg total) by mouth daily. Patient not taking: Reported on 05/13/2015 01/22/15   Janece Canterbury, MD  potassium chloride SA (K-DUR,KLOR-CON) 20 MEQ tablet Take 1 tablet (20 mEq total) by mouth daily. Patient not taking: Reported on 05/13/2015 01/22/15   Janece Canterbury, MD  sulfamethoxazole-trimethoprim (BACTRIM DS) 800-160 MG per tablet Take 1 tablet by mouth 2 (two) times daily. 02/07/15   Johnn Hai, PA-C  tiotropium (SPIRIVA) 18 MCG inhalation capsule Place 1 capsule (18 mcg total) into inhaler and inhale daily. Patient not taking: Reported on 01/10/2015 05/27/14   Niel Hummer, NP   BP 130/86 mmHg  Pulse 82  Temp(Src) 98.6 F (37 C) (Tympanic)  Resp 18  Ht _0  (1.575 m)  Wt 210 lb (95.255 kg)  BMI 38.40 kg/m2  SpO2 100%  LMP 10/15/2014 Physical Exam  Constitutional: She is oriented to person, place, and time. She appears well-developed.  HENT:  Head: Normocephalic.  Eyes: Conjunctivae and  EOM are normal. No scleral icterus.  Neck: Neck supple. No thyromegaly present.  Cardiovascular: Normal rate and regular rhythm.  Exam reveals no gallop and no friction rub.   No murmur heard. Pulmonary/Chest: No stridor. She has no wheezes. She has no rales. She exhibits no tenderness.  Abdominal: She exhibits distension. There is tenderness. There is no rebound.  Musculoskeletal: Normal range of motion. She exhibits no edema.  Lymphadenopathy:    She has no cervical adenopathy.  Neurological: She is oriented to person, place, and time. She exhibits normal muscle tone. Coordination normal.  Skin: No rash noted. No erythema.  Psychiatric: She has a normal mood and affect. Her behavior is normal.    ED Course  Procedures (including critical care time) Labs Review Labs Reviewed  CBC - Abnormal; Notable for the following:    MCH 25.2 (*)    All other components within normal limits  URINALYSIS, ROUTINE W REFLEX MICROSCOPIC (NOT AT [facility]) - Abnormal; Notable for the following:    pH 8.5 (*)    Leukocytes, UA TRACE (*)    All other components within normal limits  URINE  MICROSCOPIC-ADD ON - Abnormal; Notable for the following:    Squamous Epithelial / LPF TOO NUMEROUS TO COUNT (*)    Bacteria, UA MANY (*)    All other components within normal limits  LIPASE, BLOOD  COMPREHENSIVE METABOLIC PANEL    Imaging Review No results found. I have personally reviewed and evaluated these images and lab results as part of my medical decision-making.   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 Suspect patient has gastroenteritis and diaphragmatic hernia. She has seen a Psychologist, sport and exercise before who wanted to fix this as an outpatient. Patient will be sent home with Vicodin and Zofran instructed to drink plenty of fluids and follow-up her family doctor if not improving. Follow-up with the surgeons sometime next couple weeks to make plans for repairing this hernia    Milton Ferguson, MD 09/30/15 [phone]

## 2015-09-30 NOTE — Discharge Instructions (Signed)
Drink plenty of fluids. Take Imodium for diarrhea. Follow-up with her family doctor if not improving. Follow-up with Dr. Lovell SheehanJenkins to get your hernia repaired

## 2015-10-30 HISTORY — PX: HIATAL HERNIA REPAIR: SHX195

## 2015-11-08 ENCOUNTER — Emergency Department (HOSPITAL_COMMUNITY)
Admission: EM | Admit: 2015-11-08 | Discharge: 2015-11-09 | Disposition: A | Discharge status: Home / Self Care | Payer: Medicaid Other | Attending: Emergency Medicine | Admitting: Emergency Medicine

## 2015-11-08 ENCOUNTER — Encounter (HOSPITAL_COMMUNITY): Payer: Self-pay | Admitting: *Deleted

## 2015-11-08 DIAGNOSIS — K047 Periapical abscess without sinus: Secondary | ICD-10-CM | POA: Diagnosis not present

## 2015-11-08 DIAGNOSIS — Z8701 Personal history of pneumonia (recurrent): Secondary | ICD-10-CM | POA: Insufficient documentation

## 2015-11-08 DIAGNOSIS — E119 Type 2 diabetes mellitus without complications: Secondary | ICD-10-CM | POA: Insufficient documentation

## 2015-11-08 DIAGNOSIS — Z98811 Dental restoration status: Secondary | ICD-10-CM | POA: Diagnosis not present

## 2015-11-08 DIAGNOSIS — Z862 Personal history of diseases of the blood and blood-forming organs and certain disorders involving the immune mechanism: Secondary | ICD-10-CM | POA: Diagnosis not present

## 2015-11-08 DIAGNOSIS — Z8659 Personal history of other mental and behavioral disorders: Secondary | ICD-10-CM | POA: Diagnosis not present

## 2015-11-08 DIAGNOSIS — M542 Cervicalgia: Secondary | ICD-10-CM | POA: Diagnosis not present

## 2015-11-08 DIAGNOSIS — F17210 Nicotine dependence, cigarettes, uncomplicated: Secondary | ICD-10-CM | POA: Insufficient documentation

## 2015-11-08 DIAGNOSIS — J449 Chronic obstructive pulmonary disease, unspecified: Secondary | ICD-10-CM | POA: Insufficient documentation

## 2015-11-08 DIAGNOSIS — E669 Obesity, unspecified: Secondary | ICD-10-CM | POA: Diagnosis not present

## 2015-11-08 DIAGNOSIS — Z880 Allergy status to penicillin: Secondary | ICD-10-CM | POA: Insufficient documentation

## 2015-11-08 DIAGNOSIS — Z7982 Long term (current) use of aspirin: Secondary | ICD-10-CM | POA: Insufficient documentation

## 2015-11-08 DIAGNOSIS — R1310 Dysphagia, unspecified: Secondary | ICD-10-CM | POA: Insufficient documentation

## 2015-11-08 DIAGNOSIS — R221 Localized swelling, mass and lump, neck: Secondary | ICD-10-CM | POA: Insufficient documentation

## 2015-11-08 DIAGNOSIS — Z8679 Personal history of other diseases of the circulatory system: Secondary | ICD-10-CM | POA: Diagnosis not present

## 2015-11-08 DIAGNOSIS — K0889 Other specified disorders of teeth and supporting structures: Secondary | ICD-10-CM | POA: Diagnosis present

## 2015-11-08 NOTE — ED Notes (Signed)
Pt in c/o tenderness under her left jaw line and swelling, states she recently had a tooth problem there and completed antibiotics, denies toothache at this time

## 2015-11-08 NOTE — ED Provider Notes (Signed)
CSN: [id number]     Arrival date & time 11/08/15  2256 History  By signing my name below, I, Rayna Sexton, attest that this documentation has been prepared under the direction and in the presence of [place] Lions PA-C. Electronically Signed: Rayna Sexton, ED Scribe. 11/08/2015. 11:44 PM.    Chief Complaint  Patient presents with  . Neck Pain   The history is provided by the patient. No language interpreter was used.   HPI Comments: Shelby Hatfield is a 40 y.o. female with a PMHx of DM, COPD and upper denture use who presents to the Emergency Department complaining of worsening, moderate, left lower dental pain with recent onset. She notes associated left sided neck swelling, pain and difficulty with swallowing, and chills. Pt notes a SHx including hiatal hernia repair on 10/23/2015. She states she was also treated for dental pain then and prescribed amoxicillin which she completed. She denies SOB, chest pain, fevers or any other associated symptoms at this time.   Past Medical History  Diagnosis Date  . Bipolar 1 disorder ([place])   . Migraines   . Wears dentures     upper denture  . Adenotonsillar hypertrophy 03/2012    snores during sleep; denies apnea; states occ. wakes up coughing  . COPD (chronic obstructive pulmonary disease) ([place])   . Anemia   . Depression   . Anxiety   . Obesity   . Hypercholesterolemia   . Pneumonia   . Pancreatitis   . Diabetes mellitus without complication [facility])    Past Surgical History  Procedure Laterality Date  . Cesarean section  08/31/2001    total of  3  . Appendectomy    . Cholecystectomy    . Tubal ligation  08/31/2001  . Dilation and evacuation  04/09/2000  . Tonsillectomy and adenoidectomy  04/12/2012    Procedure: TONSILLECTOMY AND ADENOIDECTOMY;  Surgeon: Ascencion Dike, MD;  Location: [place];  Service: ENT;  Laterality: Bilateral;  . Tonsillectomy     Family History  Problem Relation Age of Onset  . Cancer Other    . Hypertension Mother   . Heart disease Mother   . Cancer Mother   . Cancer Maternal Aunt   . Cancer Maternal Uncle     lung cancer   . Stroke Maternal Grandfather    Social History  Substance Use Topics  . Smoking status: Current Some Day Smoker -- 0.33 packs/day for 18 years    Types: Cigarettes  . Smokeless tobacco: Never Used  . Alcohol Use: 0.0 oz/week    0 Standard drinks or equivalent per week     Comment: occasionally   OB History    Gravida Para Term Preterm AB TAB SAB Ectopic Multiple Living   _0 Review of Systems A complete 10 system review of systems was obtained and all systems are negative except as noted in the HPI and PMH.   Allergies  Penicillins  Home Medications   Prior to Admission medications   Medication Sig Start Date End Date Taking? Authorizing Provider  aspirin-acetaminophen-caffeine (EXCEDRIN MIGRAINE) [phone] MG per tablet Take 2 tablets by mouth every 6 (six) hours as needed for headache.    Historical Provider, MD  Blood Glucose Monitoring Suppl (BLOOD GLUCOSE METER KIT AND SUPPLIES) Dispense based on patient and insurance preference. Use to check sugar two times a day (fasting in am and at bedtime). (FOR ICD-9  250.00, 250.01). Please provide the glucometer with cheapest strips 08/21/14   Barton Dubois, MD  HYDROcodone-acetaminophen (NORCO/VICODIN) 5-325 MG tablet Take one every 6 hours for pain not helped by tylenol or motrin 09/30/15   Milton Ferguson, MD  ondansetron (ZOFRAN ODT) 4 MG disintegrating tablet 21m ODT q4 hours prn nausea/vomit 09/30/15   JMilton Ferguson MD   BP 122/64 mmHg  Pulse 110  Temp(Src) 98.9 F (37.2 C) (Oral)  Resp 18  Ht _0  (1.575 m)  Wt 200 lb (90.719 kg)  BMI 36.57 kg/m2  SpO2 94%   Physical Exam  Constitutional: She is oriented to person, place, and time. She appears well-developed and well-nourished.  HENT:  Head: Normocephalic and atraumatic.  Nose: Nose normal.  Mouth/Throat: Oropharynx  is clear and moist. No oropharyngeal exudate.  Left sided fluctuant area, adjacent to frenulum, measuring approx 1 cm.  Airway intact, no uvular deviation.  Eyes: EOM are normal.  Neck: Normal range of motion.  Left sided submandibular lymphadenopathy   Cardiovascular: Normal rate.   Pulmonary/Chest: Effort normal. No respiratory distress.  Abdominal: Soft.  Musculoskeletal: Normal range of motion.  Lymphadenopathy:    She has cervical adenopathy.  Neurological: She is alert and oriented to person, place, and time.  Skin: Skin is warm and dry.  Psychiatric: She has a normal mood and affect.  Nursing note and vitals reviewed.     ED Course  Procedures  DIAGNOSTIC STUDIES: Oxygen Saturation is 94% on RA, normal by my interpretation.    COORDINATION OF CARE: 11:39 PM Discussed next steps with pt. She verbalized understanding and is agreeable with the plan.    MDM   Final diagnoses:  Periapical abscess   Patient with slight tacycardia. Most likely periapical abscess leading to submandibular lymphadenopathy. Discussed case with Dr. WLeonides Schanz who also evaluated patient, and advised I&D with clindamycin Rx.  Patient refused I&D.   Medications  lidocaine (PF) (XYLOCAINE) 1 % injection 5 mL (5 mLs Infiltration Given by Other 11/09/15 0016)  clindamycin (CLEOCIN) capsule 300 mg (300 mg Oral Given 11/09/15 0016)   Patient may be safely discharged home with GoodRx coupon and: Discharge Medication List as of 11/09/2015 12:27 AM    START taking these medications   Details  clindamycin (CLEOCIN) 300 MG capsule Take 1 capsule (300 mg total) by mouth 3 (three) times daily., Starting 11/09/2015, Until Discontinued, Print       Discussed reasons for return. Patient to follow-up with dentist. Patient in understanding and agreement with the plan.  I personally performed the services described in this documentation, which was scribed in my presence. The recorded information has been reviewed and  is accurate.   SRaleigh Lions PA-C 11/09/15 0[place] DO 11/09/15 0126

## 2015-11-09 MED ORDER — CLINDAMYCIN HCL 300 MG PO CAPS: 300.0000 mg | ORAL_CAPSULE | Freq: Three times a day (TID) | ORAL | Status: DC

## 2015-11-09 MED ORDER — HYDROCODONE-ACETAMINOPHEN 5-325 MG PO TABS: 1.0000 | ORAL_TABLET | Freq: Four times a day (QID) | ORAL | Status: DC | PRN

## 2015-11-09 MED ORDER — LIDOCAINE HCL (PF) 1 % IJ SOLN
5.0000 mL | Freq: Once | INTRAMUSCULAR | Status: AC
  Administered 2015-11-09: 5 mL
  Filled 2015-11-09: qty 5

## 2015-11-09 MED ORDER — CLINDAMYCIN HCL 150 MG PO CAPS
300.0000 mg | ORAL_CAPSULE | Freq: Once | ORAL | Status: AC
  Administered 2015-11-09: 300 mg via ORAL
  Filled 2015-11-09: qty 2

## 2015-11-09 NOTE — Discharge Instructions (Signed)
Ms. Shelby Hatfield,  Nice meeting you! Please follow-up with a dentist (information attached if you do not have one). Return to the emergency department if you develop shortness of breath, inability to swallow. Feel better soon!  S. Lane Hacker, PA-C

## 2015-12-15 ENCOUNTER — Encounter (HOSPITAL_COMMUNITY): Payer: Self-pay | Admitting: *Deleted

## 2015-12-15 ENCOUNTER — Emergency Department (HOSPITAL_COMMUNITY)
Admission: EM | Admit: 2015-12-15 | Discharge: 2015-12-15 | Disposition: A | Discharge status: Home / Self Care | Payer: Medicaid Other | Attending: Emergency Medicine | Admitting: Emergency Medicine

## 2015-12-15 ENCOUNTER — Emergency Department (HOSPITAL_COMMUNITY): Payer: Medicaid Other

## 2015-12-15 DIAGNOSIS — Z79899 Other long term (current) drug therapy: Secondary | ICD-10-CM | POA: Diagnosis not present

## 2015-12-15 DIAGNOSIS — J449 Chronic obstructive pulmonary disease, unspecified: Secondary | ICD-10-CM | POA: Insufficient documentation

## 2015-12-15 DIAGNOSIS — Z7982 Long term (current) use of aspirin: Secondary | ICD-10-CM | POA: Diagnosis not present

## 2015-12-15 DIAGNOSIS — E119 Type 2 diabetes mellitus without complications: Secondary | ICD-10-CM | POA: Diagnosis not present

## 2015-12-15 DIAGNOSIS — R197 Diarrhea, unspecified: Secondary | ICD-10-CM | POA: Diagnosis not present

## 2015-12-15 DIAGNOSIS — F17210 Nicotine dependence, cigarettes, uncomplicated: Secondary | ICD-10-CM | POA: Insufficient documentation

## 2015-12-15 DIAGNOSIS — R1012 Left upper quadrant pain: Secondary | ICD-10-CM | POA: Diagnosis not present

## 2015-12-15 DIAGNOSIS — F319 Bipolar disorder, unspecified: Secondary | ICD-10-CM | POA: Diagnosis not present

## 2015-12-15 DIAGNOSIS — R112 Nausea with vomiting, unspecified: Secondary | ICD-10-CM | POA: Diagnosis not present

## 2015-12-15 DIAGNOSIS — R1013 Epigastric pain: Secondary | ICD-10-CM | POA: Diagnosis present

## 2015-12-15 LAB — CBC WITH DIFFERENTIAL/PLATELET
BASOS ABS: 0 10*3/uL (ref 0.0–0.1)
BASOS PCT: 0 %
EOS ABS: 0.2 10*3/uL (ref 0.0–0.7)
EOS PCT: 3 %
HCT: 33.5 % — ABNORMAL LOW (ref 36.0–46.0)
Hemoglobin: 10.5 g/dL — ABNORMAL LOW (ref 12.0–15.0)
LYMPHS PCT: 36 %
Lymphs Abs: 3.1 10*3/uL (ref 0.7–4.0)
MCH: 23.6 pg — ABNORMAL LOW (ref 26.0–34.0)
MCHC: 31.3 g/dL (ref 30.0–36.0)
MCV: 75.3 fL — AB (ref 78.0–100.0)
Monocytes Absolute: 0.6 10*3/uL (ref 0.1–1.0)
Monocytes Relative: 7 %
Neutro Abs: 4.6 10*3/uL (ref 1.7–7.7)
Neutrophils Relative %: 54 %
PLATELETS: 323 10*3/uL (ref 150–400)
RBC: 4.45 MIL/uL (ref 3.87–5.11)
RDW: 16.4 % — AB (ref 11.5–15.5)
WBC: 8.5 10*3/uL (ref 4.0–10.5)

## 2015-12-15 LAB — URINALYSIS, ROUTINE W REFLEX MICROSCOPIC
BILIRUBIN URINE: NEGATIVE
Glucose, UA: NEGATIVE mg/dL
HGB URINE DIPSTICK: NEGATIVE
Ketones, ur: NEGATIVE mg/dL
Leukocytes, UA: NEGATIVE
Nitrite: NEGATIVE
PH: 7 (ref 5.0–8.0)
Protein, ur: NEGATIVE mg/dL
SPECIFIC GRAVITY, URINE: 1.015 (ref 1.005–1.030)

## 2015-12-15 LAB — COMPREHENSIVE METABOLIC PANEL
ALBUMIN: 3.4 g/dL — AB (ref 3.5–5.0)
ALT: 30 U/L (ref 14–54)
AST: 27 U/L (ref 15–41)
Alkaline Phosphatase: 89 U/L (ref 38–126)
Anion gap: 8 (ref 5–15)
BUN: 9 mg/dL (ref 6–20)
CHLORIDE: 110 mmol/L (ref 101–111)
CO2: 22 mmol/L (ref 22–32)
CREATININE: 0.66 mg/dL (ref 0.44–1.00)
Calcium: 8.6 mg/dL — ABNORMAL LOW (ref 8.9–10.3)
GFR calc Af Amer: >60 mL/min (ref >60)
GFR calc non Af Amer: >60 mL/min (ref >60)
GLUCOSE: 105 mg/dL — AB (ref 65–99)
POTASSIUM: 3.8 mmol/L (ref 3.5–5.1)
SODIUM: 140 mmol/L (ref 135–145)
Total Bilirubin: 0.6 mg/dL (ref 0.3–1.2)
Total Protein: 6.9 g/dL (ref 6.5–8.1)

## 2015-12-15 LAB — PREGNANCY, URINE: PREG TEST UR: NEGATIVE

## 2015-12-15 LAB — LIPASE, BLOOD: Lipase: 39 U/L (ref 11–51)

## 2015-12-15 LAB — I-STAT CG4 LACTIC ACID, ED: Lactic Acid, Venous: 0.5 mmol/L (ref 0.5–2.0)

## 2015-12-15 MED ORDER — HYDROCODONE-ACETAMINOPHEN 5-325 MG PO TABS: 2.0000 | ORAL_TABLET | ORAL | Status: DC | PRN

## 2015-12-15 MED ORDER — SODIUM CHLORIDE 0.9 % IV BOLUS (SEPSIS)
1000.0000 mL | Freq: Once | INTRAVENOUS | Status: AC
  Administered 2015-12-15: 1000 mL via INTRAVENOUS

## 2015-12-15 MED ORDER — PROMETHAZINE HCL 25 MG PO TABS: 25.0000 mg | ORAL_TABLET | Freq: Four times a day (QID) | ORAL | Status: DC | PRN

## 2015-12-15 MED ORDER — ONDANSETRON HCL 4 MG/2ML IJ SOLN
4.0000 mg | Freq: Once | INTRAMUSCULAR | Status: AC
  Administered 2015-12-15: 4 mg via INTRAVENOUS
  Filled 2015-12-15: qty 2

## 2015-12-15 MED ORDER — IOHEXOL 300 MG/ML  SOLN
50.0000 mL | Freq: Once | INTRAMUSCULAR | Status: AC | PRN
  Administered 2015-12-15: 50 mL via ORAL

## 2015-12-15 MED ORDER — IOHEXOL 300 MG/ML  SOLN
100.0000 mL | Freq: Once | INTRAMUSCULAR | Status: AC | PRN
  Administered 2015-12-15: 100 mL via INTRAVENOUS

## 2015-12-15 MED ORDER — FENTANYL CITRATE (PF) 100 MCG/2ML IJ SOLN
50.0000 ug | INTRAMUSCULAR | Status: DC | PRN
  Administered 2015-12-15 (×2): 50 ug via INTRAVENOUS
  Filled 2015-12-15 (×2): qty 2

## 2015-12-15 NOTE — ED Provider Notes (Signed)
CSN: [id number]     Arrival date & time 12/15/15  1037 History  By signing my name below, I, Stephania Fragmin, attest that this documentation has been prepared under the direction and in the presence of Noemi Chapel, MD. Electronically Signed: Stephania Fragmin, ED Scribe. 12/15/2015. 12:22 PM.   Chief Complaint  Patient presents with  . Abdominal Pain   The history is provided by the patient. No language interpreter was used.   HPI Comments: AVIENDHA AZBELL is a 40 y.o. female with a history of hernia surgery in January 2017, Cesarean section x 3, appendectomy, cholecystectomy, as well as DM, obesity, hypercholesteremia, pancreatitis, anemia, who presents to the Emergency Department complaining of constant, moderate epigastric abdominal pain that began 3 days ago. She also complains of associated emesis, diarrhea, and flatus. She states in January 2017, her had a repair surgery as she was told her "stomach had twisted on itself and gone into her chest," causing significant pain and resulting in a 5-hour surgery. Patient subsequently developed fever following the surgery, and a CT scan that showed the hernia had "busted open." Patient states she last had a CT scan 3 weeks ago. She denies leg swelling.   Past Medical History  Diagnosis Date  . Bipolar 1 disorder ([place])   . Migraines   . Wears dentures     upper denture  . Adenotonsillar hypertrophy 03/2012    snores during sleep; denies apnea; states occ. wakes up coughing  . COPD (chronic obstructive pulmonary disease) ([place])   . Anemia   . Depression   . Anxiety   . Obesity   . Hypercholesterolemia   . Pneumonia   . Pancreatitis   . Diabetes mellitus without complication [facility])    Past Surgical History  Procedure Laterality Date  . Cesarean section  08/31/2001    total of  3  . Appendectomy    . Cholecystectomy    . Tubal ligation  08/31/2001  . Dilation and evacuation  04/09/2000  . Tonsillectomy and adenoidectomy  04/12/2012    Procedure:  TONSILLECTOMY AND ADENOIDECTOMY;  Surgeon: Ascencion Dike, MD;  Location: [place];  Service: ENT;  Laterality: Bilateral;  . Tonsillectomy     Family History  Problem Relation Age of Onset  . Cancer Other   . Hypertension Mother   . Heart disease Mother   . Cancer Mother   . Cancer Maternal Aunt   . Cancer Maternal Uncle     lung cancer   . Stroke Maternal Grandfather    Social History  Substance Use Topics  . Smoking status: Current Some Day Smoker -- 0.33 packs/day for 18 years    Types: Cigarettes  . Smokeless tobacco: Never Used  . Alcohol Use: 0.0 oz/week    0 Standard drinks or equivalent per week     Comment: occasionally   OB History    Gravida Para Term Preterm AB TAB SAB Ectopic Multiple Living   _0 Review of Systems  Cardiovascular: Negative for leg swelling.  Gastrointestinal: Positive for vomiting, abdominal pain and diarrhea.  All other systems reviewed and are negative.  Allergies  Penicillins  Home Medications   Prior to Admission medications   Medication Sig Start Date End Date Taking? Authorizing Provider  aspirin-acetaminophen-caffeine (EXCEDRIN MIGRAINE) [phone] MG per tablet Take 2 tablets by mouth every 6 (six) hours as needed for headache.   Yes Historical Provider, MD  Aspirin-Salicylamide-Caffeine (BC HEADACHE POWDER PO) Take 1 Package by mouth 4 (four) times daily as needed (pain).   Yes Historical Provider, MD  budesonide-formoterol (SYMBICORT) 160-4.5 MCG/ACT inhaler Inhale 2 puffs into the lungs 2 (two) times daily.   Yes Historical Provider, MD  oxyCODONE (ROXICODONE) 5 MG/5ML solution Take 5-10 mg by mouth every 4 (four) hours as needed for moderate pain or severe pain.   Yes Historical Provider, MD  Blood Glucose Monitoring Suppl (BLOOD GLUCOSE METER KIT AND SUPPLIES) Dispense based on patient and insurance preference. Use to check sugar two times a day (fasting in am and at bedtime). (FOR ICD-9 250.00,  250.01). Please provide the glucometer with cheapest strips 08/21/14   Barton Dubois, MD  HYDROcodone-acetaminophen (NORCO/VICODIN) 5-325 MG tablet Take 2 tablets by mouth every 4 (four) hours as needed for moderate pain. 12/15/15   Noemi Chapel, MD  promethazine (PHENERGAN) 25 MG tablet Take 1 tablet (25 mg total) by mouth every 6 (six) hours as needed for nausea or vomiting. 12/15/15   Noemi Chapel, MD   BP 124/76 mmHg  Pulse 80  Temp(Src) 98.3 F (36.8 C) (Oral)  Resp 16  Ht _0  (1.549 m)  Wt 196 lb (88.905 kg)  BMI 37.05 kg/m2  SpO2 98%  LMP 12/01/2015 Physical Exam  Constitutional: She appears well-developed and well-nourished. No distress.  HENT:  Head: Normocephalic and atraumatic.  Mouth/Throat: No oropharyngeal exudate.  Mucous membranes are mildly dry.  Eyes: Conjunctivae and EOM are normal. Pupils are equal, round, and reactive to light. Right eye exhibits no discharge. Left eye exhibits no discharge. No scleral icterus.  Neck: Normal range of motion. Neck supple. No JVD present. No thyromegaly present.  Cardiovascular: Normal rate, regular rhythm, normal heart sounds and intact distal pulses.  Exam reveals no gallop and no friction rub.   No murmur heard. Pulmonary/Chest: Effort normal and breath sounds normal. No respiratory distress. She has no wheezes. She has no rales.  Abdominal: Soft. Bowel sounds are normal. She exhibits no mass. There is tenderness.  Tender and full in epigastric, left upper quadrant. Very soft and non-peritoneal.    Musculoskeletal: Normal range of motion. She exhibits no edema or tenderness.  Lymphadenopathy:    She has no cervical adenopathy.  Neurological: She is alert. Coordination normal.  Skin: Skin is warm and dry. No rash noted. No erythema.  Psychiatric: She has a normal mood and affect. Her behavior is normal.  Nursing note and vitals reviewed.   ED Course  Procedures (including critical care time)  DIAGNOSTIC STUDIES: Oxygen  Saturation is 99% on RA, normal by my interpretation.    COORDINATION OF CARE: 10:56 AM - Discussed treatment plan with pt at bedside which includes CT abdomen and pain/nausea medication. Pt verbalized understanding and agreed to plan.   Labs Review Labs Reviewed  CBC WITH DIFFERENTIAL/PLATELET - Abnormal; Notable for the following:    Hemoglobin 10.5 (*)    HCT 33.5 (*)    MCV 75.3 (*)    MCH 23.6 (*)    RDW 16.4 (*)    All other components within normal limits  COMPREHENSIVE METABOLIC PANEL - Abnormal; Notable for the following:    Glucose, Bld 105 (*)    Calcium 8.6 (*)    Albumin 3.4 (*)    All other components within normal limits  URINALYSIS, ROUTINE W REFLEX MICROSCOPIC (NOT AT [facility])  PREGNANCY, URINE  LIPASE, BLOOD  I-STAT CG4 LACTIC ACID, ED  I-STAT CG4 LACTIC ACID, ED  Imaging Review Ct Abdomen Pelvis W Contrast  12/15/2015  CLINICAL DATA:  Three-day history of upper abdominal pain with nausea and vomiting EXAM: CT ABDOMEN AND PELVIS WITH CONTRAST TECHNIQUE: Multidetector CT imaging of the abdomen and pelvis was performed using the standard protocol following bolus administration of intravenous contrast. CONTRAST:  10m OMNIPAQUE IOHEXOL 300 MG/ML SOLN, 1067mOMNIPAQUE IOHEXOL 300 MG/ML SOLN COMPARISON:  September 30, 2015 FINDINGS: Lower chest: The previously noted hiatal type hernia is significantly smaller and no longer extends anterior to the aorta across the midline toward the right. There is currently a degree of paraesophageal hernia on the left with moderate left effusion and left base atelectatic change. There is mild atelectasis in the medial segment of the right middle lobe inferiorly. Lung bases otherwise are clear. Hepatobiliary: The liver is prominent, measuring 18.5 cm in length. No focal liver lesions are identified. The gallbladder is absent. There is no biliary duct dilatation. Pancreas: There is no pancreatic mass or inflammatory focus. Spleen: Spleen is  prominent measuring 13.5 x 12.7 x 7.0 cm with a measured splenic volume of 600 cubic cm. No focal splenic lesions are identified. Adrenals/Urinary Tract: Adrenals appear normal bilaterally. Kidneys bilaterally show no evidence of mass or hydronephrosis. There is no renal or ureteral calculus on either side. Urinary bladder is midline with wall thickness within normal limits. Stomach/Bowel: There is no bowel wall or mesenteric thickening. 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 No bowel obstruction is evident. No free air or portal venous air. Vascular/Lymphatic: There is no abdominal aortic aneurysm. Major mesenteric vessels appear patent. There is no adenopathy in the abdomen or pelvis. Reproductive: Uterus is anteverted. There is a probable dominant follicle in the right ovary measuring 2.4 x 1.9 cm. There is no other evidence of pelvic mass. There is no pelvic fluid collection. Other: Appendix absent. No abscess or ascites is identified in the abdomen or pelvis. Musculoskeletal: There is edema in the anterior abdominal wall consistent with recent surgery. There is no abscess in the abdominal wall. There are no blastic or lytic bone lesions. No intramuscular lesions are apparent. IMPRESSION: There is a residual left paraesophageal hernia with left pleural effusion and left base atelectatic change. There is slight atelectasis anterior right base. Postoperative changes are noted in the anterior abdominal wall without demonstrable abscess. Gallbladder is absent.  Appendix absent. Liver is prominent.  Spleen is also prominent. No bowel obstruction. No abscess. No renal or ureteral calculus. No hydronephrosis. Probable dominant follicle right ovary. Electronically Signed   By: WiLowella GripII M.D.   On: 12/15/2015 14:18   I have personally reviewed and evaluated these images and lab results as part of my medical decision-making.    MDM   Final diagnoses:  Left upper quadrant pain     I personally performed the services described in this documentation, which was scribed in my presence. The recorded information has been reviewed and is accurate.   The VS are normal Labs reviewed and c/w prior - normal without acute findings Specifically there is normal WBC, slight anemia Normal renal function and LFT's UA clean without infection or ketones Lactic 0.5 Pt informed of results - she appears stable for d/c.  Meds given in ED:  Medications  fentaNYL (SUBLIMAZE) injection 50 mcg (50 mcg Intravenous Given 12/15/15 1243)  sodium chloride 0.9 % bolus 1,000 mL (0 mLs Intravenous Stopped 12/15/15 1243)  ondansetron (ZOFRAN) injection 4 mg (4 mg Intravenous Given 12/15/15 1118)  iohexol (OMNIPAQUE)  300 MG/ML solution 50 mL (50 mLs Oral Contrast Given 12/15/15 1306)  iohexol (OMNIPAQUE) 300 MG/ML solution 100 mL (100 mLs Intravenous Contrast Given 12/15/15 1307)    New Prescriptions   HYDROCODONE-ACETAMINOPHEN (NORCO/VICODIN) 5-325 MG TABLET    Take 2 tablets by mouth every 4 (four) hours as needed for moderate pain.   PROMETHAZINE (PHENERGAN) 25 MG TABLET    Take 1 tablet (25 mg total) by mouth every 6 (six) hours as needed for nausea or vomiting.  Vicodin was a to go of 6 tabs.    Noemi Chapel, MD 12/15/15 [phone]

## 2015-12-15 NOTE — Discharge Instructions (Signed)

## 2015-12-15 NOTE — ED Notes (Signed)
prepack of hydrocodone given to pt as prescribed.

## 2015-12-15 NOTE — ED Notes (Signed)
Pt comes in with abdominal pain accompanied by emesis and diarrhea for 3 days. Pt had abdominal surgery in January on a hernia.

## 2015-12-18 MED FILL — Hydrocodone-Acetaminophen Tab 5-325 MG: ORAL | Qty: 6 | Status: AC

## 2015-12-19 ENCOUNTER — Emergency Department (HOSPITAL_COMMUNITY)
Admission: EM | Admit: 2015-12-19 | Discharge: 2015-12-19 | Disposition: A | Discharge status: Home / Self Care | Payer: Medicaid Other | Attending: Emergency Medicine | Admitting: Emergency Medicine

## 2015-12-19 ENCOUNTER — Emergency Department (HOSPITAL_COMMUNITY): Payer: Medicaid Other

## 2015-12-19 ENCOUNTER — Encounter (HOSPITAL_COMMUNITY): Payer: Self-pay | Admitting: Emergency Medicine

## 2015-12-19 DIAGNOSIS — Z7982 Long term (current) use of aspirin: Secondary | ICD-10-CM | POA: Insufficient documentation

## 2015-12-19 DIAGNOSIS — Y929 Unspecified place or not applicable: Secondary | ICD-10-CM | POA: Diagnosis not present

## 2015-12-19 DIAGNOSIS — E119 Type 2 diabetes mellitus without complications: Secondary | ICD-10-CM | POA: Insufficient documentation

## 2015-12-19 DIAGNOSIS — Z79899 Other long term (current) drug therapy: Secondary | ICD-10-CM | POA: Diagnosis not present

## 2015-12-19 DIAGNOSIS — S2232XA Fracture of one rib, left side, initial encounter for closed fracture: Secondary | ICD-10-CM | POA: Diagnosis not present

## 2015-12-19 DIAGNOSIS — W01198A Fall on same level from slipping, tripping and stumbling with subsequent striking against other object, initial encounter: Secondary | ICD-10-CM | POA: Insufficient documentation

## 2015-12-19 DIAGNOSIS — Y9389 Activity, other specified: Secondary | ICD-10-CM | POA: Insufficient documentation

## 2015-12-19 DIAGNOSIS — Y999 Unspecified external cause status: Secondary | ICD-10-CM | POA: Diagnosis not present

## 2015-12-19 DIAGNOSIS — F319 Bipolar disorder, unspecified: Secondary | ICD-10-CM | POA: Diagnosis not present

## 2015-12-19 DIAGNOSIS — F17210 Nicotine dependence, cigarettes, uncomplicated: Secondary | ICD-10-CM | POA: Insufficient documentation

## 2015-12-19 DIAGNOSIS — Z79891 Long term (current) use of opiate analgesic: Secondary | ICD-10-CM | POA: Insufficient documentation

## 2015-12-19 DIAGNOSIS — S299XXA Unspecified injury of thorax, initial encounter: Secondary | ICD-10-CM | POA: Diagnosis present

## 2015-12-19 DIAGNOSIS — E669 Obesity, unspecified: Secondary | ICD-10-CM | POA: Diagnosis not present

## 2015-12-19 DIAGNOSIS — J449 Chronic obstructive pulmonary disease, unspecified: Secondary | ICD-10-CM | POA: Diagnosis not present

## 2015-12-19 MED ORDER — OXYCODONE-ACETAMINOPHEN 5-325 MG PO TABS
2.0000 | ORAL_TABLET | Freq: Once | ORAL | Status: AC
  Administered 2015-12-19: 2 via ORAL

## 2015-12-19 MED ORDER — OXYCODONE-ACETAMINOPHEN 5-325 MG PO TABS
ORAL_TABLET | ORAL | Status: AC
  Filled 2015-12-19: qty 2

## 2015-12-19 MED ORDER — OXYCODONE-ACETAMINOPHEN 5-325 MG PO TABS: 1.0000 | ORAL_TABLET | ORAL | Status: DC | PRN

## 2015-12-19 NOTE — ED Notes (Signed)
Pt complains of left sided rib pain after slipping in falling onto ceramic tub.

## 2015-12-19 NOTE — Discharge Instructions (Signed)
Rib Fracture A rib fracture is a break or crack in one of the bones of the ribs. The ribs are a group of long, curved bones that wrap around your chest and attach to your spine. They protect your lungs and other organs in the chest cavity. A broken or cracked rib is often painful, but most do not cause other problems. Most rib fractures heal on their own over time. However, rib fractures can be more serious if multiple ribs are broken or if broken ribs move out of place and push against other structures. CAUSES   A direct blow to the chest. For example, this could happen during contact sports, a car accident, or a fall against a hard object.  Repetitive movements with high force, such as pitching a baseball or having severe coughing spells. SYMPTOMS   Pain when you breathe in or cough.  Pain when someone presses on the injured area. DIAGNOSIS  Your caregiver will perform a physical exam. Various imaging tests may be ordered to confirm the diagnosis and to look for related injuries. These tests may include a chest X-ray, computed tomography (CT), magnetic resonance imaging (MRI), or a bone scan. TREATMENT  Rib fractures usually heal on their own in 1-3 months. The longer healing period is often associated with a continued cough or other aggravating activities. During the healing period, pain control is very important. Medication is usually given to control pain. Hospitalization or surgery may be needed for more severe injuries, such as those in which multiple ribs are broken or the ribs have moved out of place.  HOME CARE INSTRUCTIONS   Avoid strenuous activity and any activities or movements that cause pain. Be careful during activities and avoid bumping the injured rib.  Gradually increase activity as directed by your caregiver.  Only take over-the-counter or prescription medications as directed by your caregiver. Do not take other medications without asking your caregiver first.  Apply ice  to the injured area for the first 1-2 days after you have been treated or as directed by your caregiver. Applying ice helps to reduce inflammation and pain.  Put ice in a plastic bag.  Place a towel between your skin and the bag.   Leave the ice on for 15-20 minutes at a time, every 2 hours while you are awake.  Perform deep breathing as directed by your caregiver. This will help prevent pneumonia, which is a common complication of a broken rib. Your caregiver may instruct you to:  Take deep breaths several times a day.  Try to cough several times a day, holding a pillow against the injured area.  Use a device called an incentive spirometer to practice deep breathing several times a day.  Drink enough fluids to keep your urine clear or pale yellow. This will help you avoid constipation.   Do not wear a rib belt or binder. These restrict breathing, which can lead to pneumonia.  SEEK IMMEDIATE MEDICAL CARE IF:   You have a fever.   You have difficulty breathing or shortness of breath.   You develop a continual cough, or you cough up thick or bloody sputum.  You feel sick to your stomach (nausea), throw up (vomit), or have abdominal pain.   You have worsening pain not controlled with medications.  MAKE SURE YOU:  Understand these instructions.  Will watch your condition.  Will get help right away if you are not doing well or get worse.   This information is not intended to  replace advice given to you by your health care provider. Make sure you discuss any questions you have with your health care provider.   Document Released: 09/14/2005 Document Revised: 05/17/2013 Document Reviewed: 11/16/2012 Elsevier Interactive Patient Education 2016 [employer]    You may take the oxycodone prescribed for pain relief.  This will make you drowsy - do not drive within 4 hours of taking this medication.  Use your incentive spirometer as instructed to help minimize the risk of  developing pneumonia as discussed.  Return here for any worsened symptoms including shortness of breath, fevers or any new symptoms.

## 2015-12-19 NOTE — ED Provider Notes (Signed)
CSN: [id number]     Arrival date & time 12/19/15  0941 History   First MD Initiated Contact with Patient 12/19/15 1003     Chief Complaint  Patient presents with  . Fall     (Consider location/radiation/quality/duration/timing/severity/associated sxs/prior Treatment) The history is provided by the patient.   Shelby Hatfield is a 40 y.o. female with past medical history as outlined below presenting with severe left sided chest wall pain which occurred suddenly yesterday when she slipped, falling against her ceramic tub while bathing a grandchild. She endorses immediate pain but was hopeful she would wake with improvement, instead feels worse.  She has persistent with pain with movement, palpation and deep inspiration. She denies shortness of breath, dizziness, fevers, increased cough from her baseline and denies hemoptysis.  She has taken no medicines prior to arrival and has found no alleviators except for avoiding movement and deep inspiration. She did not hit her head during this fall.    Past Medical History  Diagnosis Date  . Bipolar 1 disorder ([place])   . Migraines   . Wears dentures     upper denture  . Adenotonsillar hypertrophy 03/2012    snores during sleep; denies apnea; states occ. wakes up coughing  . COPD (chronic obstructive pulmonary disease) ([place])   . Anemia   . Depression   . Anxiety   . Obesity   . Hypercholesterolemia   . Pneumonia   . Pancreatitis   . Diabetes mellitus without complication [facility])    Past Surgical History  Procedure Laterality Date  . Cesarean section  08/31/2001    total of  3  . Appendectomy    . Cholecystectomy    . Tubal ligation  08/31/2001  . Dilation and evacuation  04/09/2000  . Tonsillectomy and adenoidectomy  04/12/2012    Procedure: TONSILLECTOMY AND ADENOIDECTOMY;  Surgeon: Ascencion Dike, MD;  Location: [place];  Service: ENT;  Laterality: Bilateral;  . Tonsillectomy    . Bariatric surgery     Family History    Problem Relation Age of Onset  . Cancer Other   . Hypertension Mother   . Heart disease Mother   . Cancer Mother   . Cancer Maternal Aunt   . Cancer Maternal Uncle     lung cancer   . Stroke Maternal Grandfather    Social History  Substance Use Topics  . Smoking status: Current Some Day Smoker -- 0.33 packs/day for 18 years    Types: Cigarettes  . Smokeless tobacco: Never Used  . Alcohol Use: 0.0 oz/week    0 Standard drinks or equivalent per week     Comment: occasionally   OB History    Gravida Para Term Preterm AB TAB SAB Ectopic Multiple Living   '8 3 3  5  5   3     '$ Review of Systems  Constitutional: Negative for fever.  HENT: Negative.   Respiratory: Negative for shortness of breath and wheezing.   Cardiovascular: Positive for chest pain. Negative for palpitations and leg swelling.  Gastrointestinal: Negative for nausea, vomiting, abdominal pain, constipation and abdominal distention.  Genitourinary: Negative for dysuria, urgency, frequency, hematuria and flank pain.  Musculoskeletal: Negative for back pain, joint swelling and gait problem.  Skin: Negative for color change, rash and wound.  Neurological: Negative for weakness, numbness and headaches.      Allergies  Penicillins  Home Medications   Prior to Admission medications   Medication Sig Start Date End  Date Taking? Authorizing Provider  aspirin-acetaminophen-caffeine (EXCEDRIN MIGRAINE) [phone] MG per tablet Take 2 tablets by mouth every 6 (six) hours as needed for headache.   Yes Historical Provider, MD  Aspirin-Salicylamide-Caffeine (BC HEADACHE POWDER PO) Take 1 Package by mouth 4 (four) times daily as needed (pain).   Yes Historical Provider, MD  budesonide-formoterol (SYMBICORT) 160-4.5 MCG/ACT inhaler Inhale 2 puffs into the lungs 2 (two) times daily.   Yes Historical Provider, MD  HYDROcodone-acetaminophen (NORCO/VICODIN) 5-325 MG tablet Take 2 tablets by mouth every 4 (four) hours as needed for  moderate pain. 12/15/15  Yes Noemi Chapel, MD  oxyCODONE (ROXICODONE) 5 MG/5ML solution Take 5-10 mg by mouth every 4 (four) hours as needed for moderate pain or severe pain.   Yes Historical Provider, MD  promethazine (PHENERGAN) 25 MG tablet Take 1 tablet (25 mg total) by mouth every 6 (six) hours as needed for nausea or vomiting. 12/15/15  Yes Noemi Chapel, MD  Blood Glucose Monitoring Suppl (BLOOD GLUCOSE METER KIT AND SUPPLIES) Dispense based on patient and insurance preference. Use to check sugar two times a day (fasting in am and at bedtime). (FOR ICD-9 250.00, 250.01). Please provide the glucometer with cheapest strips 08/21/14   Barton Dubois, MD  oxyCODONE-acetaminophen (PERCOCET/ROXICET) 5-325 MG tablet Take 1 tablet by mouth every 4 (four) hours as needed. 12/19/15   Evalee Jefferson, PA-C   BP 138/86 mmHg  Pulse 88  Temp(Src) 97.7 F (36.5 C) (Oral)  Resp 20  Ht '5\' 1"'$  (1.549 m)  Wt 87.544 kg  BMI 36.49 kg/m2  SpO2 100%  LMP 12/01/2015 Physical Exam  Constitutional: She appears well-developed and well-nourished.  HENT:  Head: Normocephalic and atraumatic.  Eyes: Conjunctivae are normal.  Neck: Normal range of motion.  Cardiovascular: Normal rate, regular rhythm, normal heart sounds and intact distal pulses.   Pulmonary/Chest: Effort normal and breath sounds normal. No respiratory distress. She has no wheezes. She exhibits tenderness.    ttp left lower rib cage at mid axillary line.  No flail chest, no crepitus, no bruising,deformity or hematoma.   Abdominal: Soft. Bowel sounds are normal. There is no tenderness. There is no rebound and no guarding.  Musculoskeletal: Normal range of motion.  Neurological: She is alert.  Skin: Skin is warm and dry.  Psychiatric: She has a normal mood and affect.  Nursing note and vitals reviewed.   ED Course  Procedures (including critical care time) Labs Review Labs Reviewed - No data to display  Imaging Review Dg Ribs Unilateral W/chest  Left  12/19/2015  CLINICAL DATA:  Pain following fall EXAM: LEFT RIBS AND CHEST - 3+ VIEW COMPARISON:  Chest radiograph January 19, 2015 and chest CT January 19, 2015 FINDINGS: Frontal chest as well as oblique and cone-down lower rib images were obtained. There is at hiatal type hernia. There is scarring in the left base region medially. There is no frank edema or consolidation. Heart size and pulmonary vascularity are normal. No adenopathy. No pneumothorax. Rather minimal left pleural effusion. There is a slightly displaced fracture of the anterior left tenth rib. No other fracture evident. IMPRESSION: Slightly displaced fracture anterior left tenth rib with rather minimal left pleural effusion. No pneumothorax evident. Scarring medial left base with hiatal hernia. Lungs elsewhere clear. Cardiac silhouette within normal limits. Electronically Signed   By: Lowella Grip III M.D.   On: 12/19/2015 10:43   I have personally reviewed and evaluated these images as part of my medical decision-making.   EKG Interpretation None  MDM   Final diagnoses:  Rib fracture, left, closed, initial encounter    Discussed with Dr. Rogene Houston prior to dc home. Pt with rib fx, discussed pain control, incentive spirometer given. Advised close recheck here for any increased sob, pain, fever, hemoptysis, weakness.     Evalee Jefferson, PA-C 12/19/15 2118  Fredia Sorrow, MD 12/20/15 [phone]

## 2016-01-24 ENCOUNTER — Encounter (HOSPITAL_COMMUNITY): Payer: Self-pay

## 2016-01-24 ENCOUNTER — Emergency Department (HOSPITAL_COMMUNITY): Payer: Medicaid Other

## 2016-01-24 ENCOUNTER — Emergency Department (HOSPITAL_COMMUNITY)
Admission: EM | Admit: 2016-01-24 | Discharge: 2016-01-24 | Disposition: A | Discharge status: Home / Self Care | Payer: Medicaid Other | Attending: Emergency Medicine | Admitting: Emergency Medicine

## 2016-01-24 DIAGNOSIS — Y939 Activity, unspecified: Secondary | ICD-10-CM | POA: Diagnosis not present

## 2016-01-24 DIAGNOSIS — E669 Obesity, unspecified: Secondary | ICD-10-CM | POA: Insufficient documentation

## 2016-01-24 DIAGNOSIS — F329 Major depressive disorder, single episode, unspecified: Secondary | ICD-10-CM | POA: Diagnosis not present

## 2016-01-24 DIAGNOSIS — F17210 Nicotine dependence, cigarettes, uncomplicated: Secondary | ICD-10-CM | POA: Insufficient documentation

## 2016-01-24 DIAGNOSIS — R51 Headache: Secondary | ICD-10-CM | POA: Diagnosis not present

## 2016-01-24 DIAGNOSIS — E119 Type 2 diabetes mellitus without complications: Secondary | ICD-10-CM | POA: Diagnosis not present

## 2016-01-24 DIAGNOSIS — J449 Chronic obstructive pulmonary disease, unspecified: Secondary | ICD-10-CM | POA: Diagnosis not present

## 2016-01-24 DIAGNOSIS — Y929 Unspecified place or not applicable: Secondary | ICD-10-CM | POA: Insufficient documentation

## 2016-01-24 DIAGNOSIS — W010XXA Fall on same level from slipping, tripping and stumbling without subsequent striking against object, initial encounter: Secondary | ICD-10-CM | POA: Insufficient documentation

## 2016-01-24 DIAGNOSIS — S20212A Contusion of left front wall of thorax, initial encounter: Secondary | ICD-10-CM

## 2016-01-24 DIAGNOSIS — S299XXA Unspecified injury of thorax, initial encounter: Secondary | ICD-10-CM | POA: Diagnosis present

## 2016-01-24 DIAGNOSIS — Z8781 Personal history of (healed) traumatic fracture: Secondary | ICD-10-CM

## 2016-01-24 DIAGNOSIS — Y999 Unspecified external cause status: Secondary | ICD-10-CM | POA: Insufficient documentation

## 2016-01-24 MED ORDER — IBUPROFEN 800 MG PO TABS
800.0000 mg | ORAL_TABLET | Freq: Once | ORAL | Status: AC
  Administered 2016-01-24: 800 mg via ORAL
  Filled 2016-01-24: qty 1

## 2016-01-24 MED ORDER — HYDROCODONE-ACETAMINOPHEN 5-325 MG PO TABS
1.0000 | ORAL_TABLET | Freq: Once | ORAL | Status: AC
  Administered 2016-01-24: 1 via ORAL
  Filled 2016-01-24: qty 1

## 2016-01-24 MED ORDER — HYDROCODONE-ACETAMINOPHEN 5-325 MG PO TABS: 1.0000 | ORAL_TABLET | ORAL | Status: DC | PRN

## 2016-01-24 MED ORDER — DIAZEPAM 5 MG PO TABS
10.0000 mg | ORAL_TABLET | Freq: Once | ORAL | Status: AC
  Administered 2016-01-24: 10 mg via ORAL
  Filled 2016-01-24: qty 2

## 2016-01-24 MED ORDER — BACLOFEN 10 MG PO TABS: 10.0000 mg | ORAL_TABLET | Freq: Three times a day (TID) | ORAL | Status: DC

## 2016-01-24 NOTE — ED Notes (Signed)
Pt reports broke her left ribs 1 month ago.  2 days ago pt slipped and fell on left ribs again.

## 2016-01-24 NOTE — Discharge Instructions (Signed)
Your respiratory rate is within normal limits. Your oxygen level is 100%. Your x-ray is negative for new rib fracture. I suspect that you have reinjured your left flank as a result of the fall. It is important that you splint the area with your pillow, and cough and deep breathe several times each hour to prevent other injury and problem with your lungs. Use baclofen 3 times daily for spasm pain, use ibuprofen 600 mg every 6 hours for inflammation and mild pain. May use Norco for more severe pain. Please contact the [place] [facility] to resume your medical care. Rib Contusion A rib contusion is a deep bruise on your rib area. Contusions are the result of a blunt trauma that causes bleeding and injury to the tissues under the skin. A rib contusion may involve bruising of the ribs and of the skin and muscles in the area. The skin overlying the contusion may turn blue, purple, or yellow. Minor injuries will give you a painless contusion, but more severe contusions may stay painful and swollen for a few weeks. CAUSES  A contusion is usually caused by a blow, trauma, or direct force to an area of the body. This often occurs while playing contact sports. SYMPTOMS  Swelling and redness of the injured area.  Discoloration of the injured area.  Tenderness and soreness of the injured area.  Pain with or without movement. DIAGNOSIS  The diagnosis can be made by taking a medical history and performing a physical exam. An X-ray, CT scan, or MRI may be needed to determine if there were any associated injuries, such as broken bones (fractures) or internal injuries. TREATMENT  Often, the best treatment for a rib contusion is rest. Icing or applying cold compresses to the injured area may help reduce swelling and inflammation. Deep breathing exercises may be recommended to reduce the risk of partial lung collapse and pneumonia. Over-the-counter or prescription medicines may also be  recommended for pain control. HOME CARE INSTRUCTIONS   Apply ice to the injured area:  Put ice in a plastic bag.  Place a towel between your skin and the bag.  Leave the ice on for 20 minutes, 2-3 times per day.  Take medicines only as directed by your health care provider.  Rest the injured area. Avoid strenuous activity and any activities or movements that cause pain. Be careful during activities and avoid bumping the injured area.  Perform deep-breathing exercises as directed by your health care provider.  Do not lift anything that is heavier than 5 lb (2.3 kg) until your health care provider approves.  Do not use any tobacco products, including cigarettes, chewing tobacco, or electronic cigarettes. If you need help quitting, ask your health care provider. SEEK MEDICAL CARE IF:   You have increased bruising or swelling.  You have pain that is not controlled with treatment.  You have a fever. SEEK IMMEDIATE MEDICAL CARE IF:   You have difficulty breathing or shortness of breath.  You develop a continual cough, or you cough up thick or bloody sputum.  You feel sick to your stomach (nauseous), you throw up (vomit), or you have abdominal pain.   This information is not intended to replace advice given to you by your health care provider. Make sure you discuss any questions you have with your health care provider.   Document Released: 06/09/2001 Document Revised: 10/05/2014 Document Reviewed: 06/26/2014 Elsevier Interactive Patient Education [employer]2016 [employer] Inc.

## 2016-01-24 NOTE — ED Provider Notes (Signed)
CSN: [id number]     Arrival date & time 01/24/16  4503 History   First MD Initiated Contact with Patient 01/24/16 [phone]     Chief Complaint  Patient presents with  . rib pain      (Consider location/radiation/quality/duration/timing/severity/associated sxs/prior Treatment) HPI Comments: Patient is a 40 year old female who presents to the emergency department with complaint of left rib area pain.  The patient states that approximately one month ago she sustained a fall and broken ribs on the left chest area. 2 days ago she sustained another fall and injured the ribs again. Since that time she's been having increasing soreness. She has some pain with taking a deep breath. She denies excessive cough or shortness of breath. This been no hemoptysis reported. There's been no high fever reported. No other injury reported during the fall. It is of note that the patient is not taking blood thinning type medications. She feels better when she is resting still, but this does not completely resolve her pain. The pain is worse with cough, taking a deep breath, and certain movements.  The history is provided by the patient.    Past Medical History  Diagnosis Date  . Bipolar 1 disorder ([place])   . Migraines   . Wears dentures     upper denture  . Adenotonsillar hypertrophy 03/2012    snores during sleep; denies apnea; states occ. wakes up coughing  . COPD (chronic obstructive pulmonary disease) ([place])   . Anemia   . Depression   . Anxiety   . Obesity   . Hypercholesterolemia   . Pneumonia   . Pancreatitis   . Diabetes mellitus without complication [facility])    Past Surgical History  Procedure Laterality Date  . Cesarean section  08/31/2001    total of  3  . Appendectomy    . Cholecystectomy    . Tubal ligation  08/31/2001  . Dilation and evacuation  04/09/2000  . Tonsillectomy and adenoidectomy  04/12/2012    Procedure: TONSILLECTOMY AND ADENOIDECTOMY;  Surgeon: Ascencion Dike, MD;  Location: [place];  Service: ENT;  Laterality: Bilateral;  . Tonsillectomy    . Bariatric surgery     Family History  Problem Relation Age of Onset  . Cancer Other   . Hypertension Mother   . Heart disease Mother   . Cancer Mother   . Cancer Maternal Aunt   . Cancer Maternal Uncle     lung cancer   . Stroke Maternal Grandfather    Social History  Substance Use Topics  . Smoking status: Current Some Day Smoker -- 0.33 packs/day for 18 years    Types: Cigarettes  . Smokeless tobacco: Never Used  . Alcohol Use: 0.0 oz/week    0 Standard drinks or equivalent per week     Comment: occasionally   OB History    Gravida Para Term Preterm AB TAB SAB Ectopic Multiple Living   _0 Review of Systems  Constitutional: Negative for fever and appetite change.  Respiratory:       Chest wall pain  Neurological: Positive for headaches.  Psychiatric/Behavioral:       Bipolar illness  All other systems reviewed and are negative.     Allergies  Penicillins  Home Medications   Prior to Admission medications   Medication Sig Start Date End Date Taking? Authorizing Provider  aspirin-acetaminophen-caffeine (EXCEDRIN MIGRAINE) [phone] MG per tablet Take  2 tablets by mouth every 6 (six) hours as needed for headache.    Historical Provider, MD  Aspirin-Salicylamide-Caffeine (BC HEADACHE POWDER PO) Take 1 Package by mouth 4 (four) times daily as needed (pain).    Historical Provider, MD  Blood Glucose Monitoring Suppl (BLOOD GLUCOSE METER KIT AND SUPPLIES) Dispense based on patient and insurance preference. Use to check sugar two times a day (fasting in am and at bedtime). (FOR ICD-9 250.00, 250.01). Please provide the glucometer with cheapest strips 08/21/14   Barton Dubois, MD  budesonide-formoterol [facility]) 160-4.5 MCG/ACT inhaler Inhale 2 puffs into the lungs 2 (two) times daily.    Historical Provider, MD  HYDROcodone-acetaminophen (NORCO/VICODIN) 5-325 MG tablet Take 2  tablets by mouth every 4 (four) hours as needed for moderate pain. 12/15/15   Noemi Chapel, MD  oxyCODONE (ROXICODONE) 5 MG/5ML solution Take 5-10 mg by mouth every 4 (four) hours as needed for moderate pain or severe pain.    Historical Provider, MD  oxyCODONE-acetaminophen (PERCOCET/ROXICET) 5-325 MG tablet Take 1 tablet by mouth every 4 (four) hours as needed. 12/19/15   Evalee Jefferson, PA-C  promethazine (PHENERGAN) 25 MG tablet Take 1 tablet (25 mg total) by mouth every 6 (six) hours as needed for nausea or vomiting. 12/15/15   Noemi Chapel, MD   BP 121/79 mmHg  Pulse 107  Temp(Src) 98.3 F (36.8 C) (Oral)  Resp 18  Ht 5' 1" (1.549 m)  Wt 82.555 kg  BMI 34.41 kg/m2  SpO2 100%  LMP 01/24/2016 Physical Exam  Constitutional: She is oriented to person, place, and time. She appears well-developed and well-nourished.  Non-toxic appearance.  HENT:  Head: Normocephalic.  Right Ear: Tympanic membrane and external ear normal.  Left Ear: Tympanic membrane and external ear normal.  Eyes: EOM and lids are normal. Pupils are equal, round, and reactive to light.  Neck: Normal range of motion. Neck supple. Carotid bruit is not present.  Cardiovascular: Normal rate, regular rhythm, normal heart sounds, intact distal pulses and normal pulses.   Pulmonary/Chest: Effort normal and breath sounds normal. No apnea and no tachypnea. No respiratory distress. She exhibits tenderness. She exhibits no crepitus and no retraction.    Abdominal: Soft. Bowel sounds are normal. There is no tenderness. There is no guarding.  Musculoskeletal: Normal range of motion.  Lymphadenopathy:       Head (right side): No submandibular adenopathy present.       Head (left side): No submandibular adenopathy present.    She has no cervical adenopathy.  Neurological: She is alert and oriented to person, place, and time. She has normal strength. No cranial nerve deficit or sensory deficit.  Skin: Skin is warm and dry.  Psychiatric:  She has a normal mood and affect. Her speech is normal.  Nursing note and vitals reviewed.   ED Course  Procedures (including critical care time) Labs Review Labs Reviewed - No data to display  Imaging Review No results found. I have personally reviewed and evaluated these images and lab results as part of my medical decision-making.   EKG Interpretation None      MDM  Patient sustained a fracture to the left ribs one month ago. She sustained another fall 2 days ago. Pulse oximetry is 100%. The patient speaks in complete sentences without problem. The x-ray is negative for new rib fracture. The patient is having quite a bit of pain. She states that her physician who was previously at the [facility]  is no longer there, and she has not been reassigned a physician. She request assistance with her discomfort, as over-the-counter medications have not been helping over the last couple days.  Discussed with the patient the need to get back in touch with the [facility], and make her condition and situation known. Prescription for baclofen, and Norco given to the patient. The patient is asked to use ibuprofen every 6 hours along with these medications. We also discussed the need to splint the flank area with a pillow and cough and deep breathe several times each hour. Patient is in agreement with this discharge plan.    Final diagnoses:  None    *I have reviewed nursing notes, vital signs, and all appropriate lab and imaging results for this patient.Lily Kocher, PA-C 01/24/16 7948  Milton Ferguson, MD 01/24/16 1440

## 2016-02-09 ENCOUNTER — Encounter (HOSPITAL_COMMUNITY): Payer: Self-pay | Admitting: Emergency Medicine

## 2016-02-09 ENCOUNTER — Emergency Department (HOSPITAL_COMMUNITY)
Admission: EM | Admit: 2016-02-09 | Discharge: 2016-02-10 | Disposition: A | Discharge status: Home / Self Care | Payer: Medicaid Other | Attending: Emergency Medicine | Admitting: Emergency Medicine

## 2016-02-09 DIAGNOSIS — D500 Iron deficiency anemia secondary to blood loss (chronic): Secondary | ICD-10-CM | POA: Insufficient documentation

## 2016-02-09 DIAGNOSIS — E669 Obesity, unspecified: Secondary | ICD-10-CM | POA: Diagnosis not present

## 2016-02-09 DIAGNOSIS — R1013 Epigastric pain: Secondary | ICD-10-CM | POA: Diagnosis present

## 2016-02-09 DIAGNOSIS — J449 Chronic obstructive pulmonary disease, unspecified: Secondary | ICD-10-CM | POA: Diagnosis not present

## 2016-02-09 DIAGNOSIS — R112 Nausea with vomiting, unspecified: Secondary | ICD-10-CM | POA: Insufficient documentation

## 2016-02-09 DIAGNOSIS — F17210 Nicotine dependence, cigarettes, uncomplicated: Secondary | ICD-10-CM | POA: Diagnosis not present

## 2016-02-09 DIAGNOSIS — R195 Other fecal abnormalities: Secondary | ICD-10-CM

## 2016-02-09 DIAGNOSIS — F329 Major depressive disorder, single episode, unspecified: Secondary | ICD-10-CM | POA: Diagnosis not present

## 2016-02-09 DIAGNOSIS — R197 Diarrhea, unspecified: Secondary | ICD-10-CM | POA: Insufficient documentation

## 2016-02-09 DIAGNOSIS — Z7984 Long term (current) use of oral hypoglycemic drugs: Secondary | ICD-10-CM | POA: Insufficient documentation

## 2016-02-09 DIAGNOSIS — E119 Type 2 diabetes mellitus without complications: Secondary | ICD-10-CM | POA: Diagnosis not present

## 2016-02-09 NOTE — ED Notes (Signed)
Patient complaining of abdominal pain starting yesterday with vomiting starting today. States "there was blood in my vomit the last time."

## 2016-02-09 NOTE — ED Notes (Signed)
Patient and family upset over wait time, delay explained.

## 2016-02-09 NOTE — ED Provider Notes (Signed)
CSN: [id number]     Arrival date & time 02/09/16  2156 History   First MD Initiated Contact with Patient 02/09/16 2326     Chief Complaint  Patient presents with  . Abdominal Pain  . Emesis     (Consider location/radiation/quality/duration/timing/severity/associated sxs/prior Treatment) Patient is a 40 y.o. female presenting with abdominal pain and vomiting. The history is provided by the patient. No language interpreter was used.  Abdominal Pain Pain location:  Epigastric Pain quality: sharp and squeezing   Pain radiates to:  Does not radiate Pain severity:  Severe Onset quality:  Gradual Duration:  2 days Timing:  Constant Progression:  Worsening Chronicity:  New Context: not diet changes, not recent illness, not sick contacts and not suspicious food intake   Relieved by:  Nothing Worsened by:  Eating and vomiting Ineffective treatments:  OTC medications Associated symptoms: anorexia, diarrhea, flatus, nausea and vomiting   Associated symptoms: no chest pain, no chills, no constipation, no cough, no dysuria, no fever, no shortness of breath, no vaginal bleeding and no vaginal discharge   Emesis Associated symptoms: abdominal pain and diarrhea   Associated symptoms: no chills and no headaches    Shelby Hatfield is a 40 y.o. female who presents to the ED with abdominal pain, n/v/d. The abdominal pain started yesterday followed by the n/v/d today. She reports vomiting tonight with red streaks of blood in the vomit.  Past Medical History  Diagnosis Date  . Bipolar 1 disorder ([place])   . Migraines   . Wears dentures     upper denture  . Adenotonsillar hypertrophy 03/2012    snores during sleep; denies apnea; states occ. wakes up coughing  . COPD (chronic obstructive pulmonary disease) ([place])   . Anemia   . Depression   . Anxiety   . Obesity   . Hypercholesterolemia   . Pneumonia   . Pancreatitis   . Diabetes mellitus without complication [facility])    Past Surgical History   Procedure Laterality Date  . Cesarean section  08/31/2001    total of  3  . Appendectomy    . Cholecystectomy    . Tubal ligation  08/31/2001  . Dilation and evacuation  04/09/2000  . Tonsillectomy and adenoidectomy  04/12/2012    Procedure: TONSILLECTOMY AND ADENOIDECTOMY;  Surgeon: Ascencion Dike, MD;  Location: [facility];  Service: ENT;  Laterality: Bilateral;  . Tonsillectomy    . Bariatric surgery     Family History  Problem Relation Age of Onset  . Cancer Other   . Hypertension Mother   . Heart disease Mother   . Cancer Mother   . Cancer Maternal Aunt   . Cancer Maternal Uncle     lung cancer   . Stroke Maternal Grandfather    Social History  Substance Use Topics  . Smoking status: Current Some Day Smoker -- 0.33 packs/day for 18 years    Types: Cigarettes  . Smokeless tobacco: Never Used  . Alcohol Use: 0.0 oz/week    0 Standard drinks or equivalent per week     Comment: occasionally   OB History    Gravida Para Term Preterm AB TAB SAB Ectopic Multiple Living   '8 3 3  5  5   3     '$ Review of Systems  Constitutional: Negative for fever and chills.  HENT: Negative.   Eyes: Negative for visual disturbance.  Respiratory: Negative for cough, chest tightness and shortness of breath.   Cardiovascular:  Negative for chest pain, palpitations and leg swelling.  Gastrointestinal: Positive for nausea, vomiting, abdominal pain, diarrhea, anorexia and flatus. Negative for constipation.  Genitourinary: Negative for dysuria, frequency, flank pain, decreased urine volume, vaginal bleeding, vaginal discharge and pelvic pain.  Musculoskeletal: Negative for back pain.  Skin: Negative for rash.  Neurological: Negative for seizures, syncope and headaches.  Psychiatric/Behavioral: Negative for confusion. The patient is not nervous/anxious.       Allergies  Penicillins  Home Medications   Prior to Admission medications   Medication Sig Start Date End Date Taking?  Authorizing Provider  aspirin-acetaminophen-caffeine (EXCEDRIN MIGRAINE) [id number] MG per tablet Take 2 tablets by mouth every 6 (six) hours as needed for headache.    Historical Provider, MD  Aspirin-Salicylamide-Caffeine (BC HEADACHE POWDER PO) Take 1 Package by mouth 4 (four) times daily as needed (pain).    Historical Provider, MD  baclofen (LIORESAL) 10 MG tablet Take 1 tablet (10 mg total) by mouth 3 (three) times daily. 01/24/16 02/23/16  Lily Kocher, PA-C  Blood Glucose Monitoring Suppl (BLOOD GLUCOSE METER KIT AND SUPPLIES) Dispense based on patient and insurance preference. Use to check sugar two times a day (fasting in am and at bedtime). (FOR ICD-9 250.00, 250.01). Please provide the glucometer with cheapest strips 08/21/14   Barton Dubois, MD  budesonide-formoterol [facility]) 160-4.5 MCG/ACT inhaler Inhale 2 puffs into the lungs 2 (two) times daily.    Historical Provider, MD  HYDROcodone-acetaminophen (NORCO/VICODIN) 5-325 MG tablet Take 1 tablet by mouth every 4 (four) hours as needed. 01/24/16   Lily Kocher, PA-C  oxyCODONE (ROXICODONE) 5 MG/5ML solution Take 5-10 mg by mouth every 4 (four) hours as needed for moderate pain or severe pain.    Historical Provider, MD  oxyCODONE-acetaminophen (PERCOCET/ROXICET) 5-325 MG tablet Take 1 tablet by mouth every 4 (four) hours as needed. 12/19/15   Evalee Jefferson, PA-C  promethazine (PHENERGAN) 25 MG tablet Take 1 tablet (25 mg total) by mouth every 6 (six) hours as needed for nausea or vomiting. 12/15/15   Noemi Chapel, MD   BP 119/80 mmHg  Pulse 82  Temp(Src) 98.7 F (37.1 C) (Oral)  Resp 16  Ht '5\' 1"'$  (1.549 m)  Wt 84.369 kg  BMI 35.16 kg/m2  SpO2 100%  LMP 01/24/2016 Physical Exam  Constitutional: She is oriented to person, place, and time. She appears well-developed and well-nourished. No distress.  HENT:  Head: Normocephalic and atraumatic.  Eyes: EOM are normal.  Neck: Neck supple.  Cardiovascular: Normal rate and regular rhythm.    Pulmonary/Chest: Effort normal and breath sounds normal.  Abdominal: Soft. Bowel sounds are normal. There is tenderness in the right upper quadrant, epigastric area and left upper quadrant. There is guarding. There is no rigidity, no rebound and no CVA tenderness.  Genitourinary: Rectal exam shows no external hemorrhoid, no mass and anal tone normal. Guaiac positive stool.  Musculoskeletal: Normal range of motion.  Neurological: She is alert and oriented to person, place, and time. No cranial nerve deficit.  Skin: Skin is warm and dry.  Psychiatric: Her speech is normal. She is agitated.  Nursing note and vitals reviewed.   ED Course  Procedures (including critical care time) Labs, IV, medication for nausea and pain,  Since patient has a drop in Hgb. And positive stool guaiac with severe abdominal pain will order CT abdomen and pelvis  Labs Review Results for orders placed or performed during the hospital encounter of 02/09/16 (from the past 24 hour(s))  Comprehensive metabolic panel  Status: Abnormal   Collection Time: 02/10/16 12:28 AM  Result Value Ref Range   Sodium 135 135 - 145 mmol/L   Potassium 3.5 3.5 - 5.1 mmol/L   Chloride 107 101 - 111 mmol/L   CO2 23 22 - 32 mmol/L   Glucose, Bld 84 65 - 99 mg/dL   BUN 12 6 - 20 mg/dL   Creatinine, Ser 0.79 0.44 - 1.00 mg/dL   Calcium 8.9 8.9 - 10.3 mg/dL   Total Protein 6.4 (L) 6.5 - 8.1 g/dL   Albumin 3.6 3.5 - 5.0 g/dL   AST 15 15 - 41 U/L   ALT 14 14 - 54 U/L   Alkaline Phosphatase 73 38 - 126 U/L   Total Bilirubin <0.1 (L) 0.3 - 1.2 mg/dL   GFR calc non Af Amer >60 >60 mL/min   GFR calc Af Amer >60 >60 mL/min   Anion gap 5 5 - 15  Lipase, blood     Status: None   Collection Time: 02/10/16 12:28 AM  Result Value Ref Range   Lipase 25 11 - 51 U/L  CBC with Differential     Status: Abnormal   Collection Time: 02/10/16 12:28 AM  Result Value Ref Range   WBC 8.3 4.0 - 10.5 K/uL   RBC 3.99 3.87 - 5.11 MIL/uL    Hemoglobin 9.0 (L) 12.0 - 15.0 g/dL   HCT 28.9 (L) 36.0 - 46.0 %   MCV 72.4 (L) 78.0 - 100.0 fL   MCH 22.6 (L) 26.0 - 34.0 pg   MCHC 31.1 30.0 - 36.0 g/dL   RDW 17.0 (H) 11.5 - 15.5 %   Platelets 309 150 - 400 K/uL   Neutrophils Relative % 40 %   Neutro Abs 3.3 1.7 - 7.7 K/uL   Lymphocytes Relative 51 %   Lymphs Abs 4.3 (H) 0.7 - 4.0 K/uL   Monocytes Relative 6 %   Monocytes Absolute 0.5 0.1 - 1.0 K/uL   Eosinophils Relative 3 %   Eosinophils Absolute 0.2 0.0 - 0.7 K/uL   Basophils Relative 0 %   Basophils Absolute 0.0 0.0 - 0.1 K/uL  Urinalysis, Routine w reflex microscopic     Status: Abnormal   Collection Time: 02/10/16 12:30 AM  Result Value Ref Range   Color, Urine YELLOW YELLOW   APPearance CLEAR CLEAR   Specific Gravity, Urine 1.010 1.005 - 1.030   pH 6.0 5.0 - 8.0   Glucose, UA NEGATIVE NEGATIVE mg/dL   Hgb urine dipstick NEGATIVE NEGATIVE   Bilirubin Urine NEGATIVE NEGATIVE   Ketones, ur NEGATIVE NEGATIVE mg/dL   Protein, ur NEGATIVE NEGATIVE mg/dL   Nitrite NEGATIVE NEGATIVE   Leukocytes, UA SMALL (A) NEGATIVE  Pregnancy, urine     Status: None   Collection Time: 02/10/16 12:30 AM  Result Value Ref Range   Preg Test, Ur NEGATIVE NEGATIVE  Urine microscopic-add on     Status: Abnormal   Collection Time: 02/10/16 12:30 AM  Result Value Ref Range   Squamous Epithelial / LPF 6-30 (A) NONE SEEN   WBC, UA 6-30 0 - 5 WBC/hpf   RBC / HPF 0-5 0 - 5 RBC/hpf   Bacteria, UA FEW (A) NONE SEEN   Urine-Other YEAST PRESENT      Imaging Review  MDM  40 y.o. female with abdominal pain, n/v/d awaiting CT of abdomen and pelvis. Care turned over to Dr. Roxanne Mins @ [phone]      Ashanna Heinsohn M Damika Harmon, [place] 30/09/23 3007  Delora Fuel,  MD 02/10/16 7782

## 2016-02-10 ENCOUNTER — Emergency Department (HOSPITAL_COMMUNITY): Payer: Medicaid Other

## 2016-02-10 LAB — CBC WITH DIFFERENTIAL/PLATELET
BASOS ABS: 0 10*3/uL (ref 0.0–0.1)
BASOS PCT: 0 %
Eosinophils Absolute: 0.2 10*3/uL (ref 0.0–0.7)
Eosinophils Relative: 3 %
HEMATOCRIT: 28.9 % — AB (ref 36.0–46.0)
HEMOGLOBIN: 9 g/dL — AB (ref 12.0–15.0)
Lymphocytes Relative: 51 %
Lymphs Abs: 4.3 10*3/uL — ABNORMAL HIGH (ref 0.7–4.0)
MCH: 22.6 pg — ABNORMAL LOW (ref 26.0–34.0)
MCHC: 31.1 g/dL (ref 30.0–36.0)
MCV: 72.4 fL — ABNORMAL LOW (ref 78.0–100.0)
Monocytes Absolute: 0.5 10*3/uL (ref 0.1–1.0)
Monocytes Relative: 6 %
NEUTROS ABS: 3.3 10*3/uL (ref 1.7–7.7)
NEUTROS PCT: 40 %
Platelets: 309 10*3/uL (ref 150–400)
RBC: 3.99 MIL/uL (ref 3.87–5.11)
RDW: 17 % — ABNORMAL HIGH (ref 11.5–15.5)
WBC: 8.3 10*3/uL (ref 4.0–10.5)

## 2016-02-10 LAB — URINALYSIS, ROUTINE W REFLEX MICROSCOPIC
Bilirubin Urine: NEGATIVE
Glucose, UA: NEGATIVE mg/dL
Hgb urine dipstick: NEGATIVE
Ketones, ur: NEGATIVE mg/dL
NITRITE: NEGATIVE
PH: 6 (ref 5.0–8.0)
Protein, ur: NEGATIVE mg/dL
SPECIFIC GRAVITY, URINE: 1.01 (ref 1.005–1.030)

## 2016-02-10 LAB — COMPREHENSIVE METABOLIC PANEL
ALBUMIN: 3.6 g/dL (ref 3.5–5.0)
ALT: 14 U/L (ref 14–54)
ANION GAP: 5 (ref 5–15)
AST: 15 U/L (ref 15–41)
Alkaline Phosphatase: 73 U/L (ref 38–126)
BUN: 12 mg/dL (ref 6–20)
CO2: 23 mmol/L (ref 22–32)
Calcium: 8.9 mg/dL (ref 8.9–10.3)
Chloride: 107 mmol/L (ref 101–111)
Creatinine, Ser: 0.79 mg/dL (ref 0.44–1.00)
GFR calc Af Amer: >60 mL/min (ref >60)
GLUCOSE: 84 mg/dL (ref 65–99)
POTASSIUM: 3.5 mmol/L (ref 3.5–5.1)
Sodium: 135 mmol/L (ref 135–145)
TOTAL PROTEIN: 6.4 g/dL — AB (ref 6.5–8.1)

## 2016-02-10 LAB — URINE MICROSCOPIC-ADD ON

## 2016-02-10 LAB — PREGNANCY, URINE: Preg Test, Ur: NEGATIVE

## 2016-02-10 LAB — LIPASE, BLOOD: LIPASE: 25 U/L (ref 11–51)

## 2016-02-10 LAB — POC OCCULT BLOOD, ED: FECAL OCCULT BLD: POSITIVE — AB

## 2016-02-10 MED ORDER — PROCHLORPERAZINE MALEATE 10 MG PO TABS: 10.0000 mg | ORAL_TABLET | Freq: Four times a day (QID) | ORAL | Status: DC | PRN

## 2016-02-10 MED ORDER — DIATRIZOATE MEGLUMINE & SODIUM 66-10 % PO SOLN
ORAL | Status: AC
  Administered 2016-02-10: 30 mL
  Filled 2016-02-10: qty 30

## 2016-02-10 MED ORDER — ONDANSETRON HCL 4 MG/2ML IJ SOLN
4.0000 mg | Freq: Once | INTRAMUSCULAR | Status: AC
  Administered 2016-02-10: 4 mg via INTRAVENOUS
  Filled 2016-02-10: qty 2

## 2016-02-10 MED ORDER — HYDROMORPHONE HCL 1 MG/ML IJ SOLN
1.0000 mg | Freq: Once | INTRAMUSCULAR | Status: AC
  Administered 2016-02-10: 1 mg via INTRAVENOUS
  Filled 2016-02-10: qty 1

## 2016-02-10 MED ORDER — GI COCKTAIL ~~LOC~~
30.0000 mL | Freq: Once | ORAL | Status: AC
  Administered 2016-02-10: 30 mL via ORAL
  Filled 2016-02-10: qty 30

## 2016-02-10 MED ORDER — SODIUM CHLORIDE 0.9 % IV SOLN: INTRAVENOUS | Status: DC

## 2016-02-10 MED ORDER — FAMOTIDINE IN NACL 20-0.9 MG/50ML-% IV SOLN
20.0000 mg | Freq: Once | INTRAVENOUS | Status: AC
  Administered 2016-02-10: 20 mg via INTRAVENOUS
  Filled 2016-02-10: qty 50

## 2016-02-10 MED ORDER — PANTOPRAZOLE SODIUM 40 MG IV SOLR
40.0000 mg | Freq: Once | INTRAVENOUS | Status: AC
  Administered 2016-02-10: 40 mg via INTRAVENOUS
  Filled 2016-02-10: qty 40

## 2016-02-10 MED ORDER — SUCRALFATE 1 G PO TABS: 1.0000 g | ORAL_TABLET | Freq: Three times a day (TID) | ORAL | Status: DC

## 2016-02-10 MED ORDER — METOCLOPRAMIDE HCL 5 MG/ML IJ SOLN
10.0000 mg | Freq: Once | INTRAMUSCULAR | Status: AC
  Administered 2016-02-10: 10 mg via INTRAVENOUS
  Filled 2016-02-10: qty 2

## 2016-02-10 MED ORDER — PROCHLORPERAZINE EDISYLATE 5 MG/ML IJ SOLN
10.0000 mg | Freq: Once | INTRAMUSCULAR | Status: AC
  Administered 2016-02-10: 10 mg via INTRAVENOUS
  Filled 2016-02-10: qty 2

## 2016-02-10 MED ORDER — DIPHENHYDRAMINE HCL 50 MG/ML IJ SOLN
25.0000 mg | Freq: Once | INTRAMUSCULAR | Status: AC
  Administered 2016-02-10: 25 mg via INTRAVENOUS
  Filled 2016-02-10: qty 1

## 2016-02-10 MED ORDER — IOPAMIDOL (ISOVUE-300) INJECTION 61%
100.0000 mL | Freq: Once | INTRAVENOUS | Status: AC | PRN
  Administered 2016-02-10: 100 mL via INTRAVENOUS

## 2016-02-10 MED ORDER — PANTOPRAZOLE SODIUM 40 MG PO TBEC: 40.0000 mg | DELAYED_RELEASE_TABLET | Freq: Every day | ORAL | Status: DC

## 2016-02-10 MED ORDER — OXYCODONE-ACETAMINOPHEN 5-325 MG PO TABS: 1.0000 | ORAL_TABLET | ORAL | Status: DC | PRN

## 2016-02-10 NOTE — ED Notes (Signed)
Medications given, stayed with patient in her room afterward until her family came back per her request. Pt calm and cooperative

## 2016-02-10 NOTE — ED Notes (Signed)
Medication given per order, patient in NAD, on phone all throughout night

## 2016-02-10 NOTE — ED Notes (Signed)
Pt c/o of pain, Shelby Hatfield informed

## 2016-02-10 NOTE — ED Notes (Signed)
In to start IV on patient for IVF and Zofran as ordered. Patient asked about pain medication I informed her that there were no orders right now I would ask her provider. Patient stated "i could stay at home and be in fucking be in pain", "I've already been waiting 3 god damn hours", asked patient if she would like the IV started and she replied "I dont give care". Patient then picked up her call bell and threw it to the floor". I left at that time, [facility] who stated patients nausea needed to be resolved before any pain medication would be administered", I relied this information to the patient. She continued and still is yelling and cussing loudly about wanting pain medication. Security called as a precaution. Charge nurse Inetta Fermoina informed.

## 2016-02-10 NOTE — Discharge Instructions (Signed)
I strongly suspect that your pain and blood loss are from an ulcer in your stomach or duodenum. You need to stop taking anything with aspirin in it - including BC Powder. Also, do not take any non-steroidal anti-inflammatory agents, like ibuprofen or naproxen. You also need to stop smoking.  Peptic Ulcer A peptic ulcer is a sore in the lining of your esophagus (esophageal ulcer), stomach (gastric ulcer), or in the first part of your small intestine (duodenal ulcer). The ulcer causes erosion into the deeper tissue. CAUSES  Normally, the lining of the stomach and the small intestine protects itself from the acid that digests food. The protective lining can be damaged by:  An infection caused by a bacterium called Helicobacter pylori (H. pylori).  Regular use of nonsteroidal anti-inflammatory drugs (NSAIDs), such as ibuprofen or aspirin.  Smoking tobacco. Other risk factors include being older than 50, drinking alcohol excessively, and having a family history of ulcer disease.  SYMPTOMS   Burning pain or gnawing in the area between the chest and the belly button.  Heartburn.  Nausea and vomiting.  Bloating. The pain can be worse on an empty stomach and at night. If the ulcer results in bleeding, it can cause:  Black, tarry stools.  Vomiting of bright red blood.  Vomiting of coffee-ground-looking materials. DIAGNOSIS  A diagnosis is usually made based upon your history and an exam. Other tests and procedures may be performed to find the cause of the ulcer. Finding a cause will help determine the best treatment. Tests and procedures may include:  Blood tests, stool tests, or breath tests to check for the bacterium H. pylori.  An upper gastrointestinal (GI) series of the esophagus, stomach, and small intestine.  An endoscopy to examine the esophagus, stomach, and small intestine.  A biopsy. TREATMENT  Treatment may include:  Eliminating the cause of the ulcer, such as smoking,  NSAIDs, or alcohol.  Medicines to reduce the amount of acid in your digestive tract.  Antibiotic medicines if the ulcer is caused by the H. pylori bacterium.  An upper endoscopy to treat a bleeding ulcer.  Surgery if the bleeding is severe or if the ulcer created a hole somewhere in the digestive system. HOME CARE INSTRUCTIONS   Avoid tobacco, alcohol, and caffeine. Smoking can increase the acid in the stomach, and continued smoking will impair the healing of ulcers.  Avoid foods and drinks that seem to cause discomfort or aggravate your ulcer.  Only take medicines as directed by your caregiver. Do not substitute over-the-counter medicines for prescription medicines without talking to your caregiver.  Keep any follow-up appointments and tests as directed. SEEK MEDICAL CARE IF:   Your do not improve within 7 days of starting treatment.  You have ongoing indigestion or heartburn. SEEK IMMEDIATE MEDICAL CARE IF:   You have sudden, sharp, or persistent abdominal pain.  You have bloody or dark black, tarry stools.  You vomit blood or vomit that looks like coffee grounds.  You become light-headed, weak, or feel faint.  You become sweaty or clammy. MAKE SURE YOU:   Understand these instructions.  Will watch your condition.  Will get help right away if you are not doing well or get worse.   This information is not intended to replace advice given to you by your health care provider. Make sure you discuss any questions you have with your health care provider.   Document Released: 09/11/2000 Document Revised: 10/05/2014 Document Reviewed: 04/13/2012 Elsevier Interactive Patient Education 2016  [employer]  Pantoprazole tablets What is this medicine? PANTOPRAZOLE (pan TOE pra zole) prevents the production of acid in the stomach. It is used to treat gastroesophageal reflux disease (GERD), inflammation of the esophagus, and Zollinger-Ellison syndrome. This medicine may be used  for other purposes; ask your health care provider or pharmacist if you have questions. What should I tell my health care provider before I take this medicine? They need to know if you have any of these conditions: -liver disease -low levels of magnesium in the blood -an unusual or allergic reaction to omeprazole, lansoprazole, pantoprazole, rabeprazole, other medicines, foods, dyes, or preservatives -pregnant or trying to get pregnant -breast-feeding How should I use this medicine? Take this medicine by mouth. Swallow the tablets whole with a drink of water. Follow the directions on the prescription label. Do not crush, break, or chew. Take your medicine at regular intervals. Do not take your medicine more often than directed. Talk to your pediatrician regarding the use of this medicine in children. While this drug may be prescribed for children as young as 5 years for selected conditions, precautions do apply. Overdosage: If you think you have taken too much of this medicine contact a poison control center or emergency room at once. NOTE: This medicine is only for you. Do not share this medicine with others. What if I miss a dose? If you miss a dose, take it as soon as you can. If it is almost time for your next dose, take only that dose. Do not take double or extra doses. What may interact with this medicine? Do not take this medicine with any of the following medications: -atazanavir -nelfinavir This medicine may also interact with the following medications: -ampicillin -delavirdine -erlotinib -iron salts -medicines for fungal infections like ketoconazole, itraconazole and voriconazole -methotrexate -mycophenolate mofetil -warfarin This list may not describe all possible interactions. Give your health care provider a list of all the medicines, herbs, non-prescription drugs, or dietary supplements you use. Also tell them if you smoke, drink alcohol, or use illegal drugs. Some items may  interact with your medicine. What should I watch for while using this medicine? It can take several days before your stomach pain gets better. Check with your doctor or health care professional if your condition does not start to get better, or if it gets worse. You may need blood work done while you are taking this medicine. What side effects may I notice from receiving this medicine? Side effects that you should report to your doctor or health care professional as soon as possible: -allergic reactions like skin rash, itching or hives, swelling of the face, lips, or tongue -bone, muscle or joint pain -breathing problems -chest pain or chest tightness -dark yellow or brown urine -dizziness -fast, irregular heartbeat -feeling faint or lightheaded -fever or sore throat -muscle spasm -palpitations -redness, blistering, peeling or loosening of the skin, including inside the mouth -seizures -tremors -unusual bleeding or bruising -unusually weak or tired -yellowing of the eyes or skin Side effects that usually do not require medical attention (Report these to your doctor or health care professional if they continue or are bothersome.): -constipation -diarrhea -dry mouth -headache -nausea This list may not describe all possible side effects. Call your doctor for medical advice about side effects. You may report side effects to FDA at 1-800-FDA-1088. Where should I keep my medicine? Keep out of the reach of children. Store at room temperature between 15 and 30 degrees C (59 and 86 degrees  F). Protect from light and moisture. Throw away any unused medicine after the expiration date. NOTE: This sheet is a summary. It may not cover all possible information. If you have questions about this medicine, talk to your doctor, pharmacist, or health care provider.    2016, Elsevier/Gold Standard. (2014-11-02 14:45:56)  Sucralfate tablets What is this medicine? SUCRALFATE (SOO kral fate) helps to  treat ulcers of the intestine. This medicine may be used for other purposes; ask your health care provider or pharmacist if you have questions. What should I tell my health care provider before I take this medicine? They need to know if you have any of these conditions: -kidney disease -an unusual or allergic reaction to sucralfate, other medicines, foods, dyes, or preservatives -pregnant or trying to get pregnant -breast-feeding How should I use this medicine? Take this medicine by mouth with a glass of water. Follow the directions on the prescription label. This medicine works best if you take it on an empty stomach, 1 hour before meals. Take your doses at regular intervals. Do not take your medicine more often than directed. Do not stop taking except on your doctor's advice. Talk to your pediatrician regarding the use of this medicine in children. Special care may be needed. Overdosage: If you think you have taken too much of this medicine contact a poison control center or emergency room at once. NOTE: This medicine is only for you. Do not share this medicine with others. What if I miss a dose? If you miss a dose, take it as soon as you can. If it is almost time for your next dose, take only that dose. Do not take double or extra doses. What may interact with this medicine? -antacid -cimetidine -digoxin -ketoconazole -phenytoin -quinidine -ranitidine -some antibiotics like ciprofloxacin, norfloxacin, and ofloxacin -theophylline -thyroid hormones -warfarin This list may not describe all possible interactions. Give your health care provider a list of all the medicines, herbs, non-prescription drugs, or dietary supplements you use. Also tell them if you smoke, drink alcohol, or use illegal drugs. Some items may interact with your medicine. What should I watch for while using this medicine? Visit your doctor or health care professional for regular check ups. Let your doctor know if your  symptoms do not improve or if you feel worse. Antacids should not be taken within one half hour before or after this medicine. What side effects may I notice from receiving this medicine? Side effects that you should report to your doctor or health care professional as soon as possible: -allergic reactions like skin rash, itching or hives, swelling of the face, lips, or tongue -difficulty breathing Side effects that usually do not require medical attention (report to your doctor or health care professional if they continue or are bothersome): -back pain -constipation -drowsy, dizzy -dry mouth -headache -stomach upset, gas -trouble sleeping This list may not describe all possible side effects. Call your doctor for medical advice about side effects. You may report side effects to FDA at 1-800-FDA-1088. Where should I keep my medicine? Keep out of the reach of children. Store at room temperature between 15 and 30 degrees C (59 and 86 degrees F). Keep container tightly closed. Throw away any unused medicine after the expiration date. NOTE: This sheet is a summary. It may not cover all possible information. If you have questions about this medicine, talk to your doctor, pharmacist, or health care provider.    2016, Elsevier/Gold Standard. (2008-05-16 15:46:20)  Acetaminophen; Oxycodone tablets What  is this medicine? ACETAMINOPHEN; OXYCODONE (a set a MEE noe fen; ox i KOE done) is a pain reliever. It is used to treat moderate to severe pain. This medicine may be used for other purposes; ask your health care provider or pharmacist if you have questions. What should I tell my health care provider before I take this medicine? They need to know if you have any of these conditions: -brain tumor -Crohn's disease, inflammatory bowel disease, or ulcerative colitis -drug abuse or addiction -head injury -heart or circulation problems -if you often drink alcohol -kidney disease or problems going to  the bathroom -liver disease -lung disease, asthma, or breathing problems -an unusual or allergic reaction to acetaminophen, oxycodone, other opioid analgesics, other medicines, foods, dyes, or preservatives -pregnant or trying to get pregnant -breast-feeding How should I use this medicine? Take this medicine by mouth with a full glass of water. Follow the directions on the prescription label. You can take it with or without food. If it upsets your stomach, take it with food. Take your medicine at regular intervals. Do not take it more often than directed. Talk to your pediatrician regarding the use of this medicine in children. Special care may be needed. Patients over 25 years old may have a stronger reaction and need a smaller dose. Overdosage: If you think you have taken too much of this medicine contact a poison control center or emergency room at once. NOTE: This medicine is only for you. Do not share this medicine with others. What if I miss a dose? If you miss a dose, take it as soon as you can. If it is almost time for your next dose, take only that dose. Do not take double or extra doses. What may interact with this medicine? -alcohol -antihistamines -barbiturates like amobarbital, butalbital, butabarbital, methohexital, pentobarbital, phenobarbital, thiopental, and secobarbital -benztropine -drugs for bladder problems like solifenacin, trospium, oxybutynin, tolterodine, hyoscyamine, and methscopolamine -drugs for breathing problems like ipratropium and tiotropium -drugs for certain stomach or intestine problems like propantheline, homatropine methylbromide, glycopyrrolate, atropine, belladonna, and dicyclomine -general anesthetics like etomidate, ketamine, nitrous oxide, propofol, desflurane, enflurane, halothane, isoflurane, and sevoflurane -medicines for depression, anxiety, or psychotic disturbances -medicines for sleep -muscle relaxants -naltrexone -narcotic medicines  (opiates) for pain -phenothiazines like perphenazine, thioridazine, chlorpromazine, mesoridazine, fluphenazine, prochlorperazine, promazine, and trifluoperazine -scopolamine -tramadol -trihexyphenidyl This list may not describe all possible interactions. Give your health care provider a list of all the medicines, herbs, non-prescription drugs, or dietary supplements you use. Also tell them if you smoke, drink alcohol, or use illegal drugs. Some items may interact with your medicine. What should I watch for while using this medicine? Tell your doctor or health care professional if your pain does not go away, if it gets worse, or if you have new or a different type of pain. You may develop tolerance to the medicine. Tolerance means that you will need a higher dose of the medication for pain relief. Tolerance is normal and is expected if you take this medicine for a long time. Do not suddenly stop taking your medicine because you may develop a severe reaction. Your body becomes used to the medicine. This does NOT mean you are addicted. Addiction is a behavior related to getting and using a drug for a non-medical reason. If you have pain, you have a medical reason to take pain medicine. Your doctor will tell you how much medicine to take. If your doctor wants you to stop the medicine, the dose will  be slowly lowered over time to avoid any side effects. You may get drowsy or dizzy. Do not drive, use machinery, or do anything that needs mental alertness until you know how this medicine affects you. Do not stand or sit up quickly, especially if you are an older patient. This reduces the risk of dizzy or fainting spells. Alcohol may interfere with the effect of this medicine. Avoid alcoholic drinks. There are different types of narcotic medicines (opiates) for pain. If you take more than one type at the same time, you may have more side effects. Give your health care provider a list of all medicines you use. Your  doctor will tell you how much medicine to take. Do not take more medicine than directed. Call emergency for help if you have problems breathing. The medicine will cause constipation. Try to have a bowel movement at least every 2 to 3 days. If you do not have a bowel movement for 3 days, call your doctor or health care professional. Do not take Tylenol (acetaminophen) or medicines that have acetaminophen with this medicine. Too much acetaminophen can be very dangerous. Many nonprescription medicines contain acetaminophen. Always read the labels carefully to avoid taking more acetaminophen. What side effects may I notice from receiving this medicine? Side effects that you should report to your doctor or health care professional as soon as possible: -allergic reactions like skin rash, itching or hives, swelling of the face, lips, or tongue -breathing difficulties, wheezing -confusion -light headedness or fainting spells -severe stomach pain -unusually weak or tired -yellowing of the skin or the whites of the eyes Side effects that usually do not require medical attention (report to your doctor or health care professional if they continue or are bothersome): -dizziness -drowsiness -nausea -vomiting This list may not describe all possible side effects. Call your doctor for medical advice about side effects. You may report side effects to FDA at 1-800-FDA-1088. Where should I keep my medicine? Keep out of the reach of children. This medicine can be abused. Keep your medicine in a safe place to protect it from theft. Do not share this medicine with anyone. Selling or giving away this medicine is dangerous and against the law. This medicine may cause accidental overdose and death if it taken by other adults, children, or pets. Mix any unused medicine with a substance like cat litter or coffee grounds. Then throw the medicine away in a sealed container like a sealed bag or a coffee can with a lid. Do not  use the medicine after the expiration date. Store at room temperature between 20 and 25 degrees C (68 and 77 degrees F). NOTE: This sheet is a summary. It may not cover all possible information. If you have questions about this medicine, talk to your doctor, pharmacist, or health care provider.    2016, Elsevier/Gold Standard. (2014-08-15 15:18:46)  Prochlorperazine tablets What is this medicine? PROCHLORPERAZINE (proe klor PER a zeen) helps to control severe nausea and vomiting. This medicine is also used to treat schizophrenia. It can also help patients who experience anxiety that is not due to psychological illness. This medicine may be used for other purposes; ask your health care provider or pharmacist if you have questions. What should I tell my health care provider before I take this medicine? They need to know if you have any of these conditions: -blood disorders or disease -dementia -liver disease or jaundice -Parkinson's disease -uncontrollable movement disorder -an unusual or allergic reaction to prochlorperazine, other medicines,  foods, dyes, or preservatives -pregnant or trying to get pregnant -breast-feeding How should I use this medicine? Take this medicine by mouth with a glass of water. Follow the directions on the prescription label. Take your doses at regular intervals. Do not take your medicine more often than directed. Do not stop taking this medicine suddenly. This can cause nausea, vomiting, and dizziness. Ask your doctor or health care professional for advice. Talk to your pediatrician regarding the use of this medicine in children. Special care may be needed. While this drug may be prescribed for children as young as 2 years for selected conditions, precautions do apply. Overdosage: If you think you have taken too much of this medicine contact a poison control center or emergency room at once. NOTE: This medicine is only for you. Do not share this medicine with  others. What if I miss a dose? If you miss a dose, take it as soon as you can. If it is almost time for your next dose, take only that dose. Do not take double or extra doses. What may interact with this medicine? Do not take this medicine with any of the following medications: -amoxapine -antidepressants like citalopram, escitalopram, fluoxetine, paroxetine, and sertraline -deferoxamine -dofetilide -maprotiline -tricyclic antidepressants like amitriptyline, clomipramine, imipramine, nortiptyline and others This medicine may also interact with the following medications: -lithium -medicines for pain -phenytoin -propranolol -warfarin This list may not describe all possible interactions. Give your health care provider a list of all the medicines, herbs, non-prescription drugs, or dietary supplements you use. Also tell them if you smoke, drink alcohol, or use illegal drugs. Some items may interact with your medicine. What should I watch for while using this medicine? Visit your doctor or health care professional for regular checks on your progress. You may get drowsy or dizzy. Do not drive, use machinery, or do anything that needs mental alertness until you know how this medicine affects you. Do not stand or sit up quickly, especially if you are an older patient. This reduces the risk of dizzy or fainting spells. Alcohol may interfere with the effect of this medicine. Avoid alcoholic drinks. This medicine can reduce the response of your body to heat or cold. Dress warm in cold weather and stay hydrated in hot weather. If possible, avoid extreme temperatures like saunas, hot tubs, very hot or cold showers, or activities that can cause dehydration such as vigorous exercise. This medicine can make you more sensitive to the sun. Keep out of the sun. If you cannot avoid being in the sun, wear protective clothing and use sunscreen. Do not use sun lamps or tanning beds/booths. Your mouth may get dry.  Chewing sugarless gum or sucking hard candy, and drinking plenty of water may help. Contact your doctor if the problem does not go away or is severe. What side effects may I notice from receiving this medicine? Side effects that you should report to your doctor or health care professional as soon as possible: -blurred vision -breast enlargement in men or women -breast milk in women who are not breast-feeding -chest pain, fast or irregular heartbeat -confusion, restlessness -dark yellow or brown urine -difficulty breathing or swallowing -dizziness or fainting spells -drooling, shaking, movement difficulty (shuffling walk) or rigidity -fever, chills, sore throat -involuntary or uncontrollable movements of the eyes, mouth, head, arms, and legs -seizures -stomach area pain -unusually weak or tired -unusual bleeding or bruising -yellowing of skin or eyes Side effects that usually do not require medical attention (report to  your doctor or health care professional if they continue or are bothersome): -difficulty passing urine -difficulty sleeping -headache -sexual dysfunction -skin rash, or itching This list may not describe all possible side effects. Call your doctor for medical advice about side effects. You may report side effects to FDA at 1-800-FDA-1088. Where should I keep my medicine? Keep out of the reach of children. Store at room temperature between 15 and 30 degrees C (59 and 86 degrees F). Protect from light. Throw away any unused medicine after the expiration date. NOTE: This sheet is a summary. It may not cover all possible information. If you have questions about this medicine, talk to your doctor, pharmacist, or health care provider.    2016, Elsevier/[employer] Standard. (2012-02-02 16:59:39)

## 2016-02-10 NOTE — ED Notes (Signed)
Pt now calm and cooperative, apologetic for earlier behavior, says due to pain and feeling sick. Informed patient that was okay, IV started by myself and medications given, see Emar for details

## 2016-02-12 ENCOUNTER — Ambulatory Visit (INDEPENDENT_AMBULATORY_CARE_PROVIDER_SITE_OTHER): Payer: Medicaid Other | Admitting: Gastroenterology

## 2016-02-12 ENCOUNTER — Other Ambulatory Visit: Payer: Self-pay

## 2016-02-12 ENCOUNTER — Telehealth: Payer: Self-pay

## 2016-02-12 ENCOUNTER — Encounter: Payer: Self-pay | Admitting: Gastroenterology

## 2016-02-12 VITALS — BP 122/72 | HR 98 | Temp 98.8°F | Ht 67.0 in | Wt 196.0 lb

## 2016-02-12 DIAGNOSIS — R1013 Epigastric pain: Secondary | ICD-10-CM | POA: Diagnosis not present

## 2016-02-12 DIAGNOSIS — D509 Iron deficiency anemia, unspecified: Secondary | ICD-10-CM

## 2016-02-12 DIAGNOSIS — K920 Hematemesis: Secondary | ICD-10-CM

## 2016-02-12 DIAGNOSIS — K449 Diaphragmatic hernia without obstruction or gangrene: Secondary | ICD-10-CM | POA: Diagnosis not present

## 2016-02-12 DIAGNOSIS — G8929 Other chronic pain: Secondary | ICD-10-CM | POA: Insufficient documentation

## 2016-02-12 DIAGNOSIS — R918 Other nonspecific abnormal finding of lung field: Secondary | ICD-10-CM | POA: Insufficient documentation

## 2016-02-12 DIAGNOSIS — R110 Nausea: Secondary | ICD-10-CM

## 2016-02-12 DIAGNOSIS — D649 Anemia, unspecified: Secondary | ICD-10-CM | POA: Insufficient documentation

## 2016-02-12 MED ORDER — OXYCODONE-ACETAMINOPHEN 5-325 MG PO TABS: 1.0000 | ORAL_TABLET | ORAL | Status: DC | PRN

## 2016-02-12 NOTE — Assessment & Plan Note (Signed)
40 year old female with complicated GI history, at least 1-2 years of worsening abdominal pain associated with significant weight loss. Numerous CT scans with evidence of paraesophageal and/or diaphragmatic hernia containing much of her stomach, bowel/mesentery. Ultimately underwent surgery, open paraesophageal hernia repair with gastropexy by Dr. Francee Gentilearl Wescott on 10/24/2015. Unfortunately patient has persisting moderate paraesophageal hernia based on current imaging. Gives a four-day history of epigastric pain, worse postprandially associated with numerous episodes of vomiting and ultimately with hematemesis. Notable drop in her anemia also with microcytic component. Heme positive stool. Reportedly has been on aspirin powders.   Would be concerned about strangulation related to the hernia, ulcers. Cannot exclude Mallory-Weiss tears a cause of her hematemesis. Recommend upper endoscopy for acute evaluation of these symptoms. Patient like to try to stay outpatient for now. She has antiemetics at home. Continue PPI and Carafate. Prescription provided for Percocet. If symptoms worsen, she develops worsening abdominal pain, refractory vomiting, melena, rectal bleeding she should go to the emergency department. Otherwise plan for EGD with deep sedation in the OR (h/o opiate dependency per medical records) on Friday.  I have discussed the risks, alternatives, benefits with regards to but not limited to the risk of reaction to medication, bleeding, infection, perforation and the patient is agreeable to proceed. Written consent to be obtained.  with much of her stomach and bowel/mesentery contained

## 2016-02-12 NOTE — Telephone Encounter (Signed)
Pt is aware of procedure date and time

## 2016-02-12 NOTE — Patient Instructions (Signed)
1. If worsening abdominal pain, black or bloody stools, refractory vomiting go straight to the ER. 2. Upper endoscopy as scheduled.

## 2016-02-12 NOTE — Progress Notes (Signed)
Please let the patient know that [facility] She had pulmonary nodule seen on chest CT. They recommended repeat chest CT in one year. I would like for her to follow-up with her pulmonologist regarding this.

## 2016-02-12 NOTE — Progress Notes (Signed)
Primary Care Physician:  No PCP Per Patient  Primary Gastroenterologist:  Jonette Eva, MD   Chief Complaint  Patient presents with  . Abdominal Pain    HPI:  Shelby Hatfield is a 40 y.o. female for further evaluation of hematemesis, epigastric pain, anemia. She was in the emergency department 2 days ago with abdominal pain, vomiting, hematemesis. Stool was heme positive. CT of the abdomen and pelvis showed esophageal hernia containing about half of the stomach, no other acute findings. Notable decline in her hemoglobin with microcytic anemia as well.  Patient has a history of open paraesophageal hernia repair with gastropexy on 10/24/2015 by Dr. Francee Gentile. Patient presented to ER at [facility] at recommendation of a local surgeon for large diaphragmatic hernia and abdominal pain. CT of the abdomen and chest showed large hiatal diaphragmatic hernia containing the entire stomach, duodenal bulb, the splenic flexure of the colon, small and large bowel mesentery's. Patient states that she slowly recovered from surgery but was starting to feel "normal" for a few weeks. Then she developed recurrent abdominal pain and subsequent CT of the chest/abdomen/pelvis on 11/19/2015 at [facility] showed reduction of much of the large paraesophageal hernia but remaining approximately 3.7 cm diameter diaphragmatic hiatus with distal esophagus and a portion of the stomach at the left lower chest. 2 tiny pulmonary nodules seen as well. Patient tells me she is scheduled to go back to see Dr. Carolynn Sayers in June, plans for additional surgery being contemplated.  Patient tells me over the last couple of months she has done okay but on Saturday she was at a birthday party and ate barbecue and developed acute onset upper abdominal pain associated with vomiting. Several episodes of vomiting, at least 5-6 prior to vomiting coffee grounds and fresh blood. She presented to emergency department because of  hematemesis. For 4 days she has had constant upper abdominal pain. Every time she has a bowel movement she feels like her "insides are being torn up". Has a soft bowel movement with each meal, usually within 30 minutes of eating. No melena or rectal bleeding. She is on Prilosec, Carafate, Compazine. No vomiting or couple of days. Every time she eats feels like there is a ball of fire her epigastrium. She no longer has pain medication, was tolerating symptoms at home with pain medication and antiemetics.  Patient has lost 60 pounds in the past one year.     Current Outpatient Prescriptions  Medication Sig Dispense Refill  . budesonide-formoterol (SYMBICORT) 160-4.5 MCG/ACT inhaler Inhale 2 puffs into the lungs 2 (two) times daily.    . pantoprazole (PROTONIX) 40 MG tablet Take 1 tablet (40 mg total) by mouth daily. 30 tablet 0  . promethazine (PHENERGAN) 25 MG tablet Take 1 tablet (25 mg total) by mouth every 6 (six) hours as needed for nausea or vomiting. 12 tablet 0  . sucralfate (CARAFATE) 1 g tablet Take 1 tablet (1 g total) by mouth 4 (four) times daily -  with meals and at bedtime. 60 tablet 0  . Blood Glucose Monitoring Suppl (BLOOD GLUCOSE METER KIT AND SUPPLIES) Dispense based on patient and insurance preference. Use to check sugar two times a day (fasting in am and at bedtime). (FOR ICD-9 250.00, 250.01). Please provide the glucometer with cheapest strips (Patient not taking: Reported on 02/12/2016) 1 each 5  . HYDROcodone-acetaminophen (NORCO/VICODIN) 5-325 MG tablet Take 1 tablet by mouth every 4 (four) hours as needed. (Patient not taking: Reported on 02/12/2016) 15  tablet 0  . oxyCODONE-acetaminophen (PERCOCET) 5-325 MG tablet Take 1 tablet by mouth every 4 (four) hours as needed for moderate pain. (Patient not taking: Reported on 02/12/2016) 10 tablet 0   No current facility-administered medications for this visit.    Allergies as of 02/12/2016 - Review Complete 02/12/2016  Allergen  Reaction Noted  . Penicillins Itching and Rash 07/17/2011    Past Medical History  Diagnosis Date  . Bipolar 1 disorder ([place])   . Migraines   . Wears dentures     upper denture  . Adenotonsillar hypertrophy 03/2012    snores during sleep; denies apnea; states occ. wakes up coughing  . COPD (chronic obstructive pulmonary disease) (H[facility])     stage 2, 2 liters of oxygen at night for ATX  . Anemia   . Depression   . Anxiety   . Obesity   . Hypercholesterolemia   . Pneumonia   . Diabetes mellitus without complication [facility])     Past Surgical History  Procedure Laterality Date  . Cesarean section  08/31/2001    total of  3  . Appendectomy    . Cholecystectomy    . Tubal ligation  08/31/2001  . Dilation and evacuation  04/09/2000  . Tonsillectomy and adenoidectomy  04/12/2012    Procedure: TONSILLECTOMY AND ADENOIDECTOMY;  Surgeon: Ascencion Dike, MD;  Location: [facility];  Service: ENT;  Laterality: Bilateral;  . Tonsillectomy    . Open paraesophageal hernia repair with gastroexy  09/2015    Dr. Demetrio Lapping    Family History  Problem Relation Age of Onset  . Cancer Other   . Hypertension Mother   . Heart disease Mother   . Cancer Mother   . Cancer Maternal Aunt     not sure primary, in kidney and colon  . Cancer Maternal Uncle     lung cancer   . Stroke Maternal Grandfather     Social History   Social History  . Marital Status: Single    Spouse Name: N/A  . Number of Children: 3  . Years of Education: N/A   Occupational History  . Not on file.   Social History Main Topics  . Smoking status: Current Some Day Smoker -- 0.33 packs/day for 18 years    Types: Cigarettes  . Smokeless tobacco: Never Used  . Alcohol Use: 0.0 oz/week    0 Standard drinks or equivalent per week     Comment: occasionally, twice a year, holiday  . Drug Use: No     Comment: history of cocaine in 2002  . Sexual Activity: Yes    Birth Control/ Protection: Surgical   Other  Topics Concern  . Not on file   Social History Narrative      ROS:  General: Negative for fever, chills, fatigue, weakness. See hpi. Eyes: Negative for vision changes.  ENT: Negative for hoarseness, difficulty swallowing , nasal congestion. CV: Negative for chest pain, angina, palpitations, dyspnea on exertion, peripheral edema.  Respiratory: Negative for dyspnea at rest, dyspnea on exertion, cough, sputum, wheezing.  GI: See history of present illness. GU:  Negative for dysuria, hematuria, urinary incontinence, urinary frequency, nocturnal urination.  MS: Negative for joint pain, low back pain.  Derm: Negative for rash or itching.  Neuro: Negative for weakness, abnormal sensation, seizure, frequent headaches, memory loss, confusion.  Psych: Negative for anxiety, depression, suicidal ideation, hallucinations.  Endo: see hpi Heme: Negative for bruising or bleeding. Allergy: Negative for rash or  hives.    Physical Examination:  BP 122/72 mmHg  Pulse 98  Temp(Src) 98.8 F (37.1 C) (Oral)  Ht '5\' 7"'$  (1.702 m)  Wt 196 lb (88.905 kg)  BMI 30.69 kg/m2  LMP 01/24/2016   General: Well-nourished, well-developed in no acute distress. Appears uncomfortable. Head: Normocephalic, atraumatic.   Eyes: Conjunctiva pink, no icterus. Mouth: Oropharyngeal mucosa moist and pink , no lesions erythema or exudate. Neck: Supple without thyromegaly, masses, or lymphadenopathy.  Lungs: Clear to auscultation bilaterally.  Heart: Regular rate and rhythm, no murmurs rubs or gallops.  Abdomen: Bowel sounds are normal, moderate epigastric tenderness, nondistended, no hepatosplenomegaly or masses, no abdominal bruits or    hernia , no rebound or guarding.  Well-healed midline incision.  Rectal: not performed Extremities: No lower extremity edema. No clubbing or deformities.  Neuro: Alert and oriented x 4 , grossly normal neurologically.  Skin: Warm and dry, no rash or jaundice.   Psych: Alert and  cooperative, normal mood and affect.  Labs: Lab Results  Component Value Date   LIPASE 25 02/10/2016   Lab Results  Component Value Date   CREATININE 0.79 02/10/2016   BUN 12 02/10/2016   NA 135 02/10/2016   K 3.5 02/10/2016   CL 107 02/10/2016   CO2 23 02/10/2016   Lab Results  Component Value Date   ALT 14 02/10/2016   AST 15 02/10/2016   ALKPHOS 73 02/10/2016   BILITOT <0.1* 02/10/2016   Lab Results  Component Value Date   WBC 8.3 02/10/2016   HGB 9.0* 02/10/2016   HCT 28.9* 02/10/2016   MCV 72.4* 02/10/2016   PLT 309 02/10/2016     Imaging Studies: Dg Ribs Unilateral W/chest Left  01/24/2016  CLINICAL DATA:  [place] 1 month ago on left side. [place] again last week. Pain in the left ribs. EXAM: LEFT RIBS AND CHEST - 3+ VIEW COMPARISON:  12/19/2015 FINDINGS: Chest radiograph is negative for a pneumothorax. Right lung is clear. Heart size is normal. Air-fluid level in the retrocardiac region is compatible with a hiatal hernia. Again noted is mild blunting at the left costophrenic angle. Difficult to exclude a small amount of left pleural fluid. No evidence for a displaced left rib fracture. Small elongated nodular structure at the left lung base has a different configuration on different views and may represent overlying shadows. IMPRESSION: No evidence for a displaced left rib fracture. Mild blunting at the left costophrenic angle. Previously, patient had a small left pleural effusion. Findings could be related to residual pleural fluid versus pleural thickening. Hiatal hernia. Electronically Signed   By: Markus Daft M.D.   On: 01/24/2016 09:08   Ct Abdomen Pelvis W Contrast  02/10/2016  CLINICAL DATA:  Sharp intermittent abdominal pain for 1 day. Vomiting today. EXAM: CT ABDOMEN AND PELVIS WITH CONTRAST TECHNIQUE: Multidetector CT imaging of the abdomen and pelvis was performed using the standard protocol following bolus administration of intravenous contrast. CONTRAST:  156m  ISOVUE-300 IOPAMIDOL (ISOVUE-300) INJECTION 61% COMPARISON:  12/15/2015 FINDINGS: Paraesophageal hernia containing about half of the stomach. This has been present on previous studies. Surgical clips are present at the EG junction. Minimal left pleural effusion demonstrating decreased since previous study. Surgical absence of the gallbladder. No bile duct dilatation. The liver, spleen, pancreas, adrenal glands, kidneys, abdominal aorta, inferior vena cava, and retroperitoneal lymph nodes are unremarkable. Surgical scarring along the anterior abdominal wall. Stomach is not abnormally distended. Small bowel are mostly decompressed. Scattered stool in the colon without  distention or wall thickening. No free air or free fluid in the abdomen. Pelvis: Uterus and ovaries are not enlarged. Appendix is not identified. No free or loculated pelvic fluid collections. No pelvic mass or lymphadenopathy. Bladder wall is not thickened. The no destructive bone lesions. IMPRESSION: Large paraesophageal hernia similar to previous study. No evidence of bowel obstruction or inflammation. Surgical clips at the EG junction. Electronically Signed   By: Lucienne Capers M.D.   On: 02/10/2016 03:51

## 2016-02-12 NOTE — Assessment & Plan Note (Signed)
Recommend patient follow up with pulmonologist for pulmonary nodules seen on chest CT at [facility].

## 2016-02-13 ENCOUNTER — Encounter (HOSPITAL_COMMUNITY)
Admission: RE | Admit: 2016-02-13 | Discharge: 2016-02-13 | Disposition: A | Discharge status: Home / Self Care | Payer: Medicaid Other | Source: Ambulatory Visit | Attending: Gastroenterology | Admitting: Gastroenterology

## 2016-02-13 NOTE — Progress Notes (Signed)
No pcp per patient 

## 2016-02-14 ENCOUNTER — Ambulatory Visit (HOSPITAL_COMMUNITY): Payer: Medicaid Other | Admitting: Anesthesiology

## 2016-02-14 ENCOUNTER — Ambulatory Visit (HOSPITAL_COMMUNITY)
Admission: RE | Admit: 2016-02-14 | Discharge: 2016-02-14 | Disposition: A | Discharge status: Home / Self Care | Payer: Medicaid Other | Source: Ambulatory Visit | Attending: Gastroenterology | Admitting: Gastroenterology

## 2016-02-14 ENCOUNTER — Encounter (HOSPITAL_COMMUNITY): Payer: Self-pay | Admitting: *Deleted

## 2016-02-14 ENCOUNTER — Encounter (HOSPITAL_COMMUNITY)
Admission: RE | Disposition: A | Discharge status: Home / Self Care | Payer: Self-pay | Source: Ambulatory Visit | Attending: Gastroenterology

## 2016-02-14 DIAGNOSIS — Z79899 Other long term (current) drug therapy: Secondary | ICD-10-CM | POA: Diagnosis not present

## 2016-02-14 DIAGNOSIS — K2950 Unspecified chronic gastritis without bleeding: Secondary | ICD-10-CM | POA: Diagnosis not present

## 2016-02-14 DIAGNOSIS — J449 Chronic obstructive pulmonary disease, unspecified: Secondary | ICD-10-CM | POA: Insufficient documentation

## 2016-02-14 DIAGNOSIS — R112 Nausea with vomiting, unspecified: Secondary | ICD-10-CM | POA: Diagnosis not present

## 2016-02-14 DIAGNOSIS — F319 Bipolar disorder, unspecified: Secondary | ICD-10-CM | POA: Insufficient documentation

## 2016-02-14 DIAGNOSIS — R1013 Epigastric pain: Secondary | ICD-10-CM | POA: Insufficient documentation

## 2016-02-14 DIAGNOSIS — K2970 Gastritis, unspecified, without bleeding: Secondary | ICD-10-CM

## 2016-02-14 DIAGNOSIS — Z7951 Long term (current) use of inhaled steroids: Secondary | ICD-10-CM | POA: Insufficient documentation

## 2016-02-14 DIAGNOSIS — F17210 Nicotine dependence, cigarettes, uncomplicated: Secondary | ICD-10-CM | POA: Insufficient documentation

## 2016-02-14 DIAGNOSIS — K259 Gastric ulcer, unspecified as acute or chronic, without hemorrhage or perforation: Secondary | ICD-10-CM | POA: Insufficient documentation

## 2016-02-14 DIAGNOSIS — K2980 Duodenitis without bleeding: Secondary | ICD-10-CM | POA: Diagnosis not present

## 2016-02-14 DIAGNOSIS — K449 Diaphragmatic hernia without obstruction or gangrene: Secondary | ICD-10-CM | POA: Diagnosis not present

## 2016-02-14 DIAGNOSIS — F329 Major depressive disorder, single episode, unspecified: Secondary | ICD-10-CM | POA: Diagnosis not present

## 2016-02-14 DIAGNOSIS — F419 Anxiety disorder, unspecified: Secondary | ICD-10-CM | POA: Diagnosis not present

## 2016-02-14 DIAGNOSIS — E119 Type 2 diabetes mellitus without complications: Secondary | ICD-10-CM | POA: Insufficient documentation

## 2016-02-14 DIAGNOSIS — E7800 Pure hypercholesterolemia, unspecified: Secondary | ICD-10-CM | POA: Diagnosis not present

## 2016-02-14 DIAGNOSIS — K269 Duodenal ulcer, unspecified as acute or chronic, without hemorrhage or perforation: Secondary | ICD-10-CM

## 2016-02-14 HISTORY — PX: BIOPSY: SHX5522

## 2016-02-14 HISTORY — PX: ESOPHAGOGASTRODUODENOSCOPY (EGD) WITH PROPOFOL: SHX5813

## 2016-02-14 LAB — GLUCOSE, CAPILLARY
GLUCOSE-CAPILLARY: 84 mg/dL (ref 65–99)
GLUCOSE-CAPILLARY: 70 mg/dL (ref 65–99)
GLUCOSE-CAPILLARY: 86 mg/dL (ref 65–99)

## 2016-02-14 SURGERY — ESOPHAGOGASTRODUODENOSCOPY (EGD) WITH PROPOFOL
Anesthesia: Monitor Anesthesia Care

## 2016-02-14 MED ORDER — ONDANSETRON HCL 4 MG/2ML IJ SOLN
INTRAMUSCULAR | Status: AC
  Filled 2016-02-14: qty 2

## 2016-02-14 MED ORDER — PANTOPRAZOLE SODIUM 40 MG PO TBEC: 40.0000 mg | DELAYED_RELEASE_TABLET | Freq: Two times a day (BID) | ORAL | Status: DC

## 2016-02-14 MED ORDER — PROMETHAZINE HCL 25 MG PO TABS: ORAL_TABLET | ORAL | Status: DC

## 2016-02-14 MED ORDER — BUTAMBEN-TETRACAINE-BENZOCAINE 2-2-14 % EX AERO
1.0000 | INHALATION_SPRAY | Freq: Once | CUTANEOUS | Status: AC
  Administered 2016-02-14: 1 via TOPICAL

## 2016-02-14 MED ORDER — TRAMADOL HCL 50 MG PO TABS
ORAL_TABLET | ORAL | Status: DC
Start: 2016-02-14 — End: 2016-02-26

## 2016-02-14 MED ORDER — FENTANYL CITRATE (PF) 100 MCG/2ML IJ SOLN
25.0000 ug | INTRAMUSCULAR | Status: AC
  Administered 2016-02-14: 25 ug via INTRAVENOUS

## 2016-02-14 MED ORDER — ONDANSETRON HCL 4 MG/2ML IJ SOLN: 4.0000 mg | Freq: Once | INTRAMUSCULAR | Status: DC | PRN

## 2016-02-14 MED ORDER — MIDAZOLAM HCL 2 MG/2ML IJ SOLN
INTRAMUSCULAR | Status: AC
  Filled 2016-02-14: qty 2

## 2016-02-14 MED ORDER — MIDAZOLAM HCL 5 MG/5ML IJ SOLN
INTRAMUSCULAR | Status: DC | PRN
  Administered 2016-02-14: 2 mg via INTRAVENOUS

## 2016-02-14 MED ORDER — DEXTROSE 50 % IV SOLN
12.5000 g | Freq: Once | INTRAVENOUS | Status: AC
  Administered 2016-02-14: 12.5 g via INTRAVENOUS

## 2016-02-14 MED ORDER — MIDAZOLAM HCL 2 MG/2ML IJ SOLN
1.0000 mg | INTRAMUSCULAR | Status: DC | PRN
  Administered 2016-02-14: 2 mg via INTRAVENOUS

## 2016-02-14 MED ORDER — LACTATED RINGERS IV SOLN
INTRAVENOUS | Status: DC | PRN
  Administered 2016-02-14: 13:00:00 via INTRAVENOUS

## 2016-02-14 MED ORDER — SUCCINYLCHOLINE CHLORIDE 20 MG/ML IJ SOLN
INTRAMUSCULAR | Status: AC
  Filled 2016-02-14: qty 1

## 2016-02-14 MED ORDER — FENTANYL CITRATE (PF) 100 MCG/2ML IJ SOLN
INTRAMUSCULAR | Status: AC
  Filled 2016-02-14: qty 2

## 2016-02-14 MED ORDER — ONDANSETRON HCL 4 MG/2ML IJ SOLN
4.0000 mg | Freq: Once | INTRAMUSCULAR | Status: AC
  Administered 2016-02-14: 4 mg via INTRAVENOUS

## 2016-02-14 MED ORDER — DEXTROSE 50 % IV SOLN
INTRAVENOUS | Status: AC
  Filled 2016-02-14: qty 50

## 2016-02-14 MED ORDER — LACTATED RINGERS IV SOLN
INTRAVENOUS | Status: DC
  Administered 2016-02-14: 13:00:00 via INTRAVENOUS

## 2016-02-14 MED ORDER — LIDOCAINE HCL (PF) 1 % IJ SOLN
INTRAMUSCULAR | Status: AC
  Filled 2016-02-14: qty 5

## 2016-02-14 MED ORDER — FENTANYL CITRATE (PF) 100 MCG/2ML IJ SOLN: 25.0000 ug | INTRAMUSCULAR | Status: DC | PRN

## 2016-02-14 MED ORDER — PROPOFOL 500 MG/50ML IV EMUL
INTRAVENOUS | Status: DC | PRN
  Administered 2016-02-14: 150 ug/kg/min via INTRAVENOUS

## 2016-02-14 MED ORDER — LIDOCAINE VISCOUS 2 % MT SOLN: OROMUCOSAL | Status: DC

## 2016-02-14 MED ORDER — PROPOFOL 10 MG/ML IV BOLUS
INTRAVENOUS | Status: AC
  Filled 2016-02-14: qty 20

## 2016-02-14 NOTE — Anesthesia Postprocedure Evaluation (Signed)
Anesthesia Post Note  Patient: Shelby Hatfield  Procedure(s) Performed: Procedure(s) (LRB): ESOPHAGOGASTRODUODENOSCOPY (EGD) WITH PROPOFOL (N/A) BIOPSY  Patient location during evaluation: PACU Anesthesia Type: MAC Level of consciousness: awake and alert and oriented Pain management: pain level controlled Vital Signs Assessment: post-procedure vital signs reviewed and stable Respiratory status: spontaneous breathing Cardiovascular status: blood pressure returned to baseline Postop Assessment: no signs of nausea or vomiting Anesthetic complications: no    Last Vitals:  Filed Vitals:   02/14/16 1345 02/14/16 1421  BP: 113/72 115/72  Pulse:  79  Temp:  37 C  Resp: 15 18    Last Pain:  Filed Vitals:   02/14/16 1452  PainSc: 0-No pain                 Laurabelle Gorczyca

## 2016-02-14 NOTE — Progress Notes (Signed)
Tried to call pt and VM is not set up 

## 2016-02-14 NOTE — Anesthesia Preprocedure Evaluation (Signed)
Anesthesia Evaluation  Patient identified by MRN, date of birth, ID band Patient awake    Reviewed: Allergy & Precautions, H&P , NPO status , Patient's Chart, lab work & pertinent test results, reviewed documented beta blocker date and time   Airway Mallampati: II  TM Distance: >3 FB Neck ROM: full    Dental  (+) Poor Dentition, Missing, Chipped, Dental Advisory Given, Edentulous Upper   Pulmonary neg pulmonary ROS, shortness of breath, COPD, Current Smoker,    breath sounds clear to auscultation       Cardiovascular negative cardio ROS   Rhythm:Regular Rate:Normal     Neuro/Psych  Headaches, PSYCHIATRIC DISORDERS Anxiety Depression Bipolar Disorder    GI/Hepatic   Endo/Other  diabetes (off meds now), Type 2  Renal/GU   negative genitourinary   Musculoskeletal   Abdominal   Peds  Hematology negative hematology ROS (+)   Anesthesia Other Findings Opiate dependence  Reproductive/Obstetrics negative OB ROS                             Anesthesia Physical Anesthesia Plan  ASA: III  Anesthesia Plan: MAC   Post-op Pain Management:    Induction: Intravenous  Airway Management Planned: Simple Face Mask  Additional Equipment:   Intra-op Plan:   Post-operative Plan:   Informed Consent: I have reviewed the patients History and Physical, chart, labs and discussed the procedure including the risks, benefits and alternatives for the proposed anesthesia with the patient or authorized representative who has indicated his/her understanding and acceptance.     Plan Discussed with:   Anesthesia Plan Comments:         Anesthesia Quick Evaluation

## 2016-02-14 NOTE — Progress Notes (Signed)
After dextrose given, no recheck fingerstick bloodsugar needed til PACU.

## 2016-02-14 NOTE — Op Note (Signed)
[facility] Patient Name: Shelby Hatfield Procedure Date: 02/14/2016 1:40 PM MRN: [id number] Date of Birth: 1976/03/06 Attending MD: Jonette Eva , MD CSN: [id number] Age: 40 Admit Type: Outpatient Procedure:                Upper GI endoscopy Indications:              Epigastric abdominal pain, Nausea with                            vomiting-USES 3 BC POWDERS DAILY Providers:                Jonette Eva, MD, Nena Polio, RN, Burke Keels,                            Technician Referring MD:              Medicines:                Propofol per Anesthesia Complications:            No immediate complications. Estimated Blood Loss:     Estimated blood loss was minimal. Procedure:                Pre-Anesthesia Assessment:                           - Prior to the procedure, a History and Physical                            was performed, and patient medications and                            allergies were reviewed. The patient's tolerance of                            previous anesthesia was also reviewed. The risks                            and benefits of the procedure and the sedation                            options and risks were discussed with the patient.                            All questions were answered, and informed consent                            was obtained. Prior Anticoagulants: The patient has                            taken aspirin, last dose was day of procedure. ASA                            Grade Assessment: II - A patient with mild systemic  disease. After reviewing the risks and benefits,                            the patient was deemed in satisfactory condition to                            undergo the procedure.                           After obtaining informed consent, the endoscope was                            passed under direct vision. Throughout the                            procedure, the patient's blood pressure,  pulse, and                            oxygen saturations were monitored continuously. The                            [id number] ([id number] scope was introduced through the                            mouth, and advanced to the second part of duodenum.                            The upper GI endoscopy was accomplished with ease.                            The patient tolerated the procedure well. Scope In: 2:01:06 PM Scope Out: 2:12:34 PM Total Procedure Duration: 0 hours 11 minutes 28 seconds  Findings:      The examined esophagus was normal.      A large hiatal hernia was present. Biopsies were taken with a cold       forceps for histology.      One non-bleeding cratered gastric ulcer with no stigmata of bleeding was       found in the gastric fundus. This was biopsied with a cold forceps for       histology.      Multiple dispersed, small non-bleeding erosions were found in the       gastric fundus and in the gastric body. There were no stigmata of recent       bleeding. Biopsies were taken with a cold forceps for histology.      Diffuse moderate inflammation characterized by congestion (edema),       erosions and erythema was found in the gastric antrum. Biopsies were       taken with a cold forceps for Helicobacter pylori testing.      A few localized erosions without bleeding were found in the duodenal       bulb and in the second portion of the duodenum. Impression:               - Normal esophagus.                           -  Large hiatal hernia.                           - EPIGASTRIC PAIN DUE TO PUD/EROSIVE                            GASTRITIS/DUODENITIS Moderate Sedation:      Per Anesthesia Care Recommendation:           - Patient has a contact number available for                            emergencies. The signs and symptoms of potential                            delayed complications were discussed with the                            patient. Return to normal activities  tomorrow.                            Written discharge instructions were provided to the                            patient.                           - Mechanical soft diet.                           - Continue present medications.                           - Await pathology results.                           - Repeat upper endoscopy in 3 months for                            surveillance.                           - Return to GI office in 4 months.                           DRINK WATER TO KEEP YOUR URINE LIGHT YELLOW.                           STRICTLY AVOID ASPIRIN, BC/GOODY POWDERS,                            IBUPROFEN/MOTRIN, OR NAPROXEN/ALEVE FOR 3 WEEKS                            BECAUSE YOU HAVE A ULCERS IN YOUR STOMACH.  FOLLOW A LOW FAT/SOFT MECHNICAL DIET. MEATS SHOULD                            BE CHOPPED OR GROUND ONLY. DO NOT EAT CHUNKS OF                            ANYTHING. SEE INFO BELOW.                           IF YOU HAVE VOMITING, FOLLOW A FULL LIQUID DIET                            ONLY. SEE INFO BELOW.                           USE TYLENOL & ULTRAM OR PERCOCET AS NEEDED FOR PAIN.                           YOU CAN USE CARAFATE AS NEEDED OR VISCOUS LIDOCAINE                            AS NEEDED FOR PAIN. USE VISCOUS LIDOCAINE 2 TSP                            Q4-6H PRN FOR CHEST, OR UPPER ABDOMINAL PAIN OR                            HEARTBURN. USE NO MORE THAN 8 DOSES A DAY. IT WILL                            MAKE YOUR MOUTH, ESOPHAGUS, AND STOMACH NUMB.                           AVOID ALCOHOL.                           USE PHENERGAN AS NEEDED FOR NAUSEA OR VOMITING.                           CONTINUE PROTONIX BUT INCREASE DOSE AND TAKE 30                            MINUTES PRIOR TO YOUR MEALS TWICE DAILY.                           You should call for a follow up appt with Dr.                            Lorin Picket. YOU MAY NEED A HIATAL HERNIA  REPAIR.                           FOLLOW UP IN 4 MOS. Procedure Code(s):        ---  Professional ---                           [phone], Esophagogastroduodenoscopy, flexible,                            transoral; with biopsy, single or multiple Diagnosis Code(s):        --- Professional ---                           K44.9, Diaphragmatic hernia without obstruction or                            gangrene                           K25.9, Gastric ulcer, unspecified as acute or                            chronic, without hemorrhage or perforation                           K31.89, Other diseases of stomach and duodenum                           K29.70, Gastritis, unspecified, without bleeding                           K26.9, Duodenal ulcer, unspecified as acute or                            chronic, without hemorrhage or perforation                           R10.13, Epigastric pain                           R11.2, Nausea with vomiting, unspecified CPT copyright 2016 [employer]. All rights reserved. The codes documented in this report are preliminary and upon coder review may  be revised to meet current compliance requirements. Jonette Eva, MD Jonette Eva, MD 02/14/2016 9:43:05 PM This report has been signed electronically. Number of Addenda: 0

## 2016-02-14 NOTE — Discharge Instructions (Signed)
YOUR ABDOMINAL PAIN IS DUE TO ULCERS, GASTRITIS, AND DUODENITIS. YOU HAVE small ulcers in your stomach. I BIOPSIED YOUR STOMACH.   DRINK WATER TO KEEP YOUR URINE LIGHT YELLOW.  STRICTLY AVOID ASPIRIN, BC/GOODY POWDERS, IBUPROFEN/MOTRIN, OR NAPROXEN/ALEVE FOR 3 WEEKS BECAUSE YOU HAVE A ULCERS IN YOUR STOMACH.  FOLLOW A LOW FAT/SOFT MECHNICAL DIET.  MEATS SHOULD BE CHOPPED OR GROUND ONLY. DO NOT EAT CHUNKS OF ANYTHING. SEE INFO BELOW.   IF YOU HAVE VOMITING, FOLLOW A FULL LIQUID DIET ONLY. SEE INFO BELOW.  USE TYLENOL & ULTRAM OR PERCOCET AS NEEDED FOR PAIN.  YOU CAN USE CARAFATE AS NEEDED OR VISCOUS LIDOCAINE AS NEEDED FOR PAIN.  USE VISCOUS LIDOCAINE 2 TSP Q4-6H PRN FOR CHEST, OR UPPER ABDOMINAL PAIN OR HEARTBURN. USE NO MORE THAN 8 DOSES A DAY. IT WILL MAKE YOUR MOUTH, ESOPHAGUS, AND STOMACH NUMB.  AVOID ALCOHOL.    USE PHENERGAN AS NEEDED FOR NAUSEA OR VOMITING.  CONTINUE PROTONIX BUT INCREASE DOSE AND TAKE 30 MINUTES PRIOR TO YOUR MEALS TWICE DAILY.  You should call for a follow up appt with Dr. Lorin Picket. YOU MAY NEED A HIATAL HERNIA REPAIR.  FOLLOW UP IN 4 MOS.     UPPER ENDOSCOPY AFTER CARE Read the instructions outlined below and refer to this sheet in the next week. These discharge instructions provide you with general information on caring for yourself after you leave the hospital. While your treatment has been planned according to the most current medical practices available, unavoidable complications occasionally occur. If you have any problems or questions after discharge, call DR. Perl Kerney, [phone].  ACTIVITY  You may resume your regular activity, but move at a slower pace for the next 24 hours.   Take frequent rest periods for the next 24 hours.   Walking will help get rid of the air and reduce the bloated feeling in your belly (abdomen).   No driving for 24 hours (because of the medicine (anesthesia) used during the test).   You may shower.   Do not sign  any important legal documents or operate any machinery for 24 hours (because of the anesthesia used during the test).    NUTRITION  Drink plenty of fluids.   You may resume your normal diet as instructed by your doctor.   Begin with a light meal and progress to your normal diet. Heavy or fried foods are harder to digest and may make you feel sick to your stomach (nauseated).   Avoid alcoholic beverages for 24 hours or as instructed.    MEDICATIONS  You may resume your normal medications.   WHAT YOU CAN EXPECT TODAY  Some feelings of bloating in the abdomen.   Passage of more gas than usual.    IF YOU HAD A BIOPSY TAKEN DURING THE UPPER ENDOSCOPY:    Eat a soft diet IF YOU HAVE NAUSEA, BLOATING, ABDOMINAL PAIN, OR VOMITING.    FINDING OUT THE RESULTS OF YOUR TEST Not all test results are available during your visit. DR. Darrick Penna WILL CALL YOU WITHIN 14 DAYS OF YOUR PROCEDUE WITH YOUR RESULTS. Do not assume everything is normal if you have not heard from DR. Alanis Clift, CALL HER OFFICE AT [phone].  SEEK IMMEDIATE MEDICAL ATTENTION AND CALL THE OFFICE: [phone] IF:  You have more than a spotting of blood in your stool.   Your belly is swollen (abdominal distention).   You are nauseated or vomiting.   You have a temperature over 101F.   You have abdominal pain  or discomfort that is severe or gets worse throughout the day.   Ulcer Disease (Peptic Ulcer, Gastric Ulcer, Duodenal Ulcer) You have an ulcer. This may be in your stomach (gastric ulcer) or in the first part of your small bowel, the duodenum (duodenal ulcer). An ulcer is a break in the lining of the stomach or duodenum. The ulcer causes erosion into the deeper tissue.  CAUSES The stomach has a lining to protect itself from the acid that digests food. The lining can be damaged in two main ways:  The Helico Pylori bacteria (H. Pyolori) can infect the lining of the stomach and cause ulcers.   Nonsteroidal,  anti-inflammatory medications (NSAIDS) can cause gastric ulcerations.   Smoking tobacco can increase the acid in the stomach. This can lead to ulcers, and will impair healing of ulcers.   Other factors, such as alcohol use and stress may contribute to ulcer formation.  ULCERS MAY BE DUE TO LOW BLOOD SUPPLY TO THE STOMACH LINING IF YOU HAVE A LARGE HIATAL HERNIA.  SYMPTOMS The problems (symptoms) of ulcer disease are usually a burning or gnawing of the mid-upper belly (abdomen). This is often worse on an empty stomach and may get better with food. This may be associated with feeling sick to your stomach (nausea), bloating, and vomiting.  HOME CARE INSTRUCTIONS Continue regular work and usual activities unless advised otherwise by your caregiver.  Avoid tobacco, alcohol, and caffeine. Tobacco use will decrease and slow the rates of healing.   Avoid foods that seem to aggravate or cause discomfort.    Full Liquid Diet A high-calorie, high-protein supplement should be used to meet your nutritional requirements when the full liquid diet is continued for more than 2 or 3 days. If this diet is to be used for an extended period of time (more than 7 days), a multivitamin should be considered.  Breads and Starches  Allowed: None are allowed except crackersWHOLE OR pureed (made into a thick, smooth soup) in soup.   Avoid: Any others.    Potatoes/Pasta/Rice  Allowed: ANY ITEM AS A SOUP OR SMALL PLATE OF MASHED POTATOES OR SCRAMBLED EGGS.       Vegetables  Allowed: Strained tomato or vegetable juice. Vegetables pureed in soup.   Avoid: Any others.    Fruit  Allowed: Any strained fruit juices and fruit drinks. Include 1 serving of citrus or vitamin C-enriched fruit juice daily.   Avoid: Any others.  Meat and Meat Substitutes  Allowed: Egg  Avoid: Any meat, fish, or fowl. All cheese.  Milk  Allowed: SOY Milk beverages, including milk shakes and instant breakfast mixes. Smooth  yogurt.   Avoid: Any others. Avoid dairy products if not tolerated.    Soups and Combination Foods  Allowed: Broth, strained cream soups. Strained, broth-based soups.   Avoid: Any others.    Desserts and Sweets  Allowed: flavored gelatin, tapioca, ice cream, sherbet, smooth pudding, junket, fruit ices, frozen ice pops, pudding pops, frozen fudge pops, chocolate syrup. Sugar, honey, jelly, syrup.   Avoid: Any others.  Fats and Oils  Allowed: Margarine, butter, cream, sour cream, oils.   Avoid: Any others.  Beverages  Allowed: All.   Avoid: None.  Condiments  Allowed: Iodized salt, pepper, spices, flavorings. Cocoa powder.   Avoid: Any others.    SAMPLE MEAL PLAN Breakfast   cup orange juice.   1 OR 2 EGGS  1 cup milk.   1 cup beverage (coffee or tea).   Cream or sugar,  if desired.    Midmorning Snack  2 SCRAMBLED OR HARD BOILED EGG   Lunch  1 cup cream soup.    cup fruit juice.   1 cup milk.    cup custard.   1 cup beverage (coffee or tea).   Cream or sugar, if desired.    Midafternoon Snack  1 cup milk shake.  Dinner  1 cup cream soup.    cup fruit juice.   1 cup MILK    cup pudding.   1 cup beverage (coffee or tea).   Cream or sugar, if desired.  Evening Snack  1 cup supplement.  To increase calories, add sugar, cream, butter, or margarine if possible. Nutritional supplements will also increase the total calories.

## 2016-02-14 NOTE — Transfer of Care (Signed)
Immediate Anesthesia Transfer of Care Note  Patient: Shelby Hatfield  Procedure(s) Performed: Procedure(s) with comments: ESOPHAGOGASTRODUODENOSCOPY (EGD) WITH PROPOFOL (N/A) - 1315 BIOPSY - gastric biopsies  Patient Location: PACU  Anesthesia Type:MAC  Level of Consciousness: awake  Airway & Oxygen Therapy: Patient Spontanous Breathing and Patient connected to face mask oxygen  Post-op Assessment: Report given to RN  Post vital signs: Reviewed and stable  Last Vitals:  Filed Vitals:   02/14/16 1340 02/14/16 1345  BP: 123/76 113/72  Pulse:    Temp:    Resp: 16 15    Last Pain:  Filed Vitals:   02/14/16 1356  PainSc: 5       Patients Stated Pain Goal: 7 (02/14/16 1354)  Complications: No apparent anesthesia complications

## 2016-02-14 NOTE — H&P (Signed)
Primary Care Physician:  No PCP Per Patient Primary Gastroenterologist:  Dr. Oneida Alar  Pre-Procedure History & Physical: HPI:  Shelby Hatfield is a 40 y.o. female here for HEMATEMESIS. I PERSONALLY REVIEWED THE CT JAN V. MAY 2017 WITH DR. Entrekin.   Past Medical History  Diagnosis Date  . Bipolar 1 disorder ([place])   . Migraines   . Wears dentures     upper denture  . Adenotonsillar hypertrophy 03/2012    snores during sleep; denies apnea; states occ. wakes up coughing  . COPD (chronic obstructive pulmonary disease) ([facility])     stage 2, 2 liters of oxygen at night for ATX  . Anemia   . Depression   . Anxiety   . Obesity   . Hypercholesterolemia   . Pneumonia   . Diabetes mellitus without complication [facility])     Past Surgical History  Procedure Laterality Date  . Cesarean section  08/31/2001    total of  3  . Appendectomy    . Cholecystectomy    . Tubal ligation  08/31/2001  . Dilation and evacuation  04/09/2000  . Tonsillectomy and adenoidectomy  04/12/2012    Procedure: TONSILLECTOMY AND ADENOIDECTOMY;  Surgeon: Ascencion Dike, MD;  Location: [place];  Service: ENT;  Laterality: Bilateral;  . Tonsillectomy    . Open paraesophageal hernia repair with gastroexy  09/2015    Dr. Demetrio Lapping    Prior to Admission medications   Medication Sig Start Date End Date Taking? Authorizing Provider  budesonide-formoterol (SYMBICORT) 160-4.5 MCG/ACT inhaler Inhale 2 puffs into the lungs 2 (two) times daily.   Yes Historical Provider, MD  oxyCODONE-acetaminophen (PERCOCET) 5-325 MG tablet Take 1 tablet by mouth every 4 (four) hours as needed for moderate pain. 02/12/16  Yes Mahala Menghini, PA-C  pantoprazole (PROTONIX) 40 MG tablet Take 1 tablet (40 mg total) by mouth daily. 2/42/35  Yes Delora Fuel, MD  prochlorperazine (COMPAZINE) 10 MG tablet Take 10 mg by mouth every 6 (six) hours as needed for nausea or vomiting.   Yes Historical Provider, MD  sucralfate (CARAFATE) 1 g tablet  Take 1 tablet (1 g total) by mouth 4 (four) times daily -  with meals and at bedtime. 3/61/44  Yes Delora Fuel, MD  Blood Glucose Monitoring Suppl (BLOOD GLUCOSE METER KIT AND SUPPLIES) Dispense based on patient and insurance preference. Use to check sugar two times a day (fasting in am and at bedtime). (FOR ICD-9 250.00, 250.01). Please provide the glucometer with cheapest strips Patient not taking: Reported on 02/12/2016 08/21/14   Barton Dubois, MD  promethazine (PHENERGAN) 25 MG tablet Take 1 tablet (25 mg total) by mouth every 6 (six) hours as needed for nausea or vomiting. 12/15/15   Noemi Chapel, MD    Allergies as of 02/12/2016 - Review Complete 02/12/2016  Allergen Reaction Noted  . Penicillins Itching and Rash 07/17/2011    Family History  Problem Relation Age of Onset  . Cancer Other   . Hypertension Mother   . Heart disease Mother   . Cancer Mother   . Cancer Maternal Aunt     not sure primary, in kidney and colon  . Cancer Maternal Uncle     lung cancer   . Stroke Maternal Grandfather     Social History   Social History  . Marital Status: Divorced    Spouse Name: N/A  . Number of Children: 3  . Years of Education: N/A   Occupational History  .  Not on file.   Social History Main Topics  . Smoking status: Current Some Day Smoker -- 0.33 packs/day for 18 years    Types: Cigarettes  . Smokeless tobacco: Never Used  . Alcohol Use: 0.0 oz/week    0 Standard drinks or equivalent per week     Comment: occasionally, twice a year, holiday  . Drug Use: No     Comment: history of cocaine in 2002  . Sexual Activity: Yes    Birth Control/ Protection: Surgical   Other Topics Concern  . Not on file   Social History Narrative    Review of Systems: See HPI, otherwise negative ROS   Physical Exam: BP 129/80 mmHg  Pulse 105  Temp(Src) 98.3 F (36.8 C) (Oral)  Resp 15  Ht '5\' 7"'$  (1.702 m)  Wt 196 lb (88.905 kg)  BMI 30.69 kg/m2  SpO2 89%  LMP  01/24/2016 General:   Alert,  pleasant and cooperative in NAD Head:  Normocephalic and atraumatic. Neck:  Supple; Lungs:  Clear throughout to auscultation.    Heart:  Regular rate and rhythm. Abdomen:  Soft, nontender and nondistended. Normal bowel sounds, without guarding, and without rebound.   Neurologic:  Alert and  oriented x4;  grossly normal neurologically.  Impression/Plan:     HEMATEMESIS  PLAN:  EGD TODAY

## 2016-02-14 NOTE — Progress Notes (Signed)
Per Dr Jayme CloudGonzalez instructions.

## 2016-02-15 ENCOUNTER — Emergency Department (HOSPITAL_COMMUNITY)
Admission: EM | Admit: 2016-02-15 | Discharge: 2016-02-16 | Disposition: A | Discharge status: Home / Self Care | Payer: Medicaid Other | Attending: Emergency Medicine | Admitting: Emergency Medicine

## 2016-02-15 DIAGNOSIS — E669 Obesity, unspecified: Secondary | ICD-10-CM | POA: Insufficient documentation

## 2016-02-15 DIAGNOSIS — R110 Nausea: Secondary | ICD-10-CM | POA: Diagnosis present

## 2016-02-15 DIAGNOSIS — F319 Bipolar disorder, unspecified: Secondary | ICD-10-CM | POA: Diagnosis not present

## 2016-02-15 DIAGNOSIS — J449 Chronic obstructive pulmonary disease, unspecified: Secondary | ICD-10-CM | POA: Diagnosis not present

## 2016-02-15 DIAGNOSIS — Z6830 Body mass index (BMI) 30.0-30.9, adult: Secondary | ICD-10-CM | POA: Diagnosis not present

## 2016-02-15 DIAGNOSIS — Z760 Encounter for issue of repeat prescription: Secondary | ICD-10-CM | POA: Diagnosis not present

## 2016-02-15 DIAGNOSIS — Z79891 Long term (current) use of opiate analgesic: Secondary | ICD-10-CM | POA: Diagnosis not present

## 2016-02-15 DIAGNOSIS — Z79899 Other long term (current) drug therapy: Secondary | ICD-10-CM | POA: Diagnosis not present

## 2016-02-15 DIAGNOSIS — F17210 Nicotine dependence, cigarettes, uncomplicated: Secondary | ICD-10-CM | POA: Diagnosis not present

## 2016-02-15 DIAGNOSIS — R109 Unspecified abdominal pain: Secondary | ICD-10-CM | POA: Insufficient documentation

## 2016-02-15 DIAGNOSIS — E119 Type 2 diabetes mellitus without complications: Secondary | ICD-10-CM | POA: Insufficient documentation

## 2016-02-15 LAB — COMPREHENSIVE METABOLIC PANEL
ALBUMIN: 3.5 g/dL (ref 3.5–5.0)
ALK PHOS: 72 U/L (ref 38–126)
ALT: 15 U/L (ref 14–54)
ANION GAP: 7 (ref 5–15)
AST: 19 U/L (ref 15–41)
BILIRUBIN TOTAL: 0.1 mg/dL — AB (ref 0.3–1.2)
BUN: 16 mg/dL (ref 6–20)
CALCIUM: 8.5 mg/dL — AB (ref 8.9–10.3)
CO2: 22 mmol/L (ref 22–32)
Chloride: 107 mmol/L (ref 101–111)
Creatinine, Ser: 1.05 mg/dL — ABNORMAL HIGH (ref 0.44–1.00)
GFR calc non Af Amer: >60 mL/min (ref >60)
GLUCOSE: 120 mg/dL — AB (ref 65–99)
POTASSIUM: 4 mmol/L (ref 3.5–5.1)
SODIUM: 136 mmol/L (ref 135–145)
TOTAL PROTEIN: 6.9 g/dL (ref 6.5–8.1)

## 2016-02-15 LAB — CBC
HEMATOCRIT: 27.8 % — AB (ref 36.0–46.0)
HEMOGLOBIN: 8.8 g/dL — AB (ref 12.0–15.0)
MCH: 22.9 pg — ABNORMAL LOW (ref 26.0–34.0)
MCHC: 31.7 g/dL (ref 30.0–36.0)
MCV: 72.2 fL — ABNORMAL LOW (ref 78.0–100.0)
Platelets: 335 10*3/uL (ref 150–400)
RBC: 3.85 MIL/uL — AB (ref 3.87–5.11)
RDW: 17.6 % — ABNORMAL HIGH (ref 11.5–15.5)
WBC: 7 10*3/uL (ref 4.0–10.5)

## 2016-02-15 LAB — LIPASE, BLOOD: Lipase: 24 U/L (ref 11–51)

## 2016-02-15 MED ORDER — LIDOCAINE VISCOUS 2 % MT SOLN
10.0000 mL | Freq: Once | OROMUCOSAL | Status: AC
  Administered 2016-02-16: 10 mL via OROMUCOSAL
  Filled 2016-02-15: qty 15

## 2016-02-15 MED ORDER — ONDANSETRON 8 MG PO TBDP
8.0000 mg | ORAL_TABLET | Freq: Once | ORAL | Status: AC
  Administered 2016-02-16: 8 mg via ORAL
  Filled 2016-02-15: qty 1
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
## 2016-02-15 NOTE — ED Notes (Signed)
Pt states she is unable to urinate at this time.
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
## 2016-02-16 ENCOUNTER — Telehealth: Payer: Self-pay | Admitting: Gastroenterology

## 2016-02-16 MED ORDER — PROMETHAZINE HCL 25 MG PO TABS: 12.5000 mg | ORAL_TABLET | Freq: Four times a day (QID) | ORAL | Status: DC | PRN

## 2016-02-16 MED ORDER — PANTOPRAZOLE SODIUM 40 MG PO TBEC: 40.0000 mg | DELAYED_RELEASE_TABLET | Freq: Two times a day (BID) | ORAL | Status: DC

## 2016-02-16 MED ORDER — LIDOCAINE VISCOUS 2 % MT SOLN: 10.0000 mL | OROMUCOSAL | Status: DC | PRN

## 2016-02-16 NOTE — Discharge Instructions (Signed)
Medicine Refill at the Emergency Department  We have refilled your medicine today, but it is best for you to get refills through your primary health care provider's office. In the future, please plan ahead so you do not need to get refills from the emergency department.  If the medicine we refilled was a maintenance medicine, you may have received only enough to get you by until you are able to see your regular health care provider.     This information is not intended to replace advice given to you by your health care provider. Make sure you discuss any questions you have with your health care provider.     Document Released: 01/01/2004 Document Revised: 10/05/2014 Document Reviewed: 12/22/2013  Elsevier Interactive Patient Education 2016 [employer]vier Inc[employer]

## 2016-02-16 NOTE — ED Notes (Signed)
Pt unable to urinate at this time.  
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
## 2016-02-17 ENCOUNTER — Telehealth: Payer: Self-pay | Admitting: Gastroenterology
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
## 2016-02-17 NOTE — Telephone Encounter (Signed)
Noted  
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
## 2016-02-17 NOTE — Telephone Encounter (Signed)
Doris, See phone note dated 5/21

## 2016-02-18 MED ORDER — LIDOCAINE VISCOUS 2 % MT SOLN: 10.0000 mL | OROMUCOSAL | Status: DC | PRN
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
## 2016-02-18 NOTE — Telephone Encounter (Addendum)
PLEASE CALL PT. She can use the viscous lidocaine and ultram for pain control. A Rx FOR VISCOUS lidocaine was sent to [facility] [address]. No more Percocet will be given. USE PHENERGAN PRN FOR NAUSEA/VOMTIING.

## 2016-02-18 NOTE — Telephone Encounter (Signed)
Pt is aware.  

## 2016-02-18 NOTE — Addendum Note (Signed)
Addended by: West BaliFIELDS, Jaala Bohle L on: 02/18/2016 08:48 AM   Modules accepted: Orders

## 2016-02-20 ENCOUNTER — Telehealth: Payer: Self-pay

## 2016-02-20 MED ORDER — ONDANSETRON HCL 4 MG PO TABS: 4.0000 mg | ORAL_TABLET | Freq: Four times a day (QID) | ORAL | Status: DC | PRN

## 2016-02-20 NOTE — Progress Notes (Signed)
Tried to call pt. Vm not set up. Mailing a letter for her to call.

## 2016-02-20 NOTE — Telephone Encounter (Signed)
Would be a good idea for her to see a pulmonologist anyway given her COPD and they can follow her pulmonary nodules. Please schedule him provide copy of CT.  Please make sure patient is following Dr. Darrick Pennafields instructions from last week: 1. NSAIDs or aspirin.  2. Soft diet.  3. Carafate and/or viscous lidocaine for pain.  4. Protonix twice a day.  5. Make sure she is call Dr. Carolynn SayersWescott to schedule follow-up appointment with him. 6. Zofran Rx sent to pharmacy.

## 2016-02-20 NOTE — Telephone Encounter (Signed)
Pt called ( after I had tried to call her) and was given the information about [facility].( see note in OV note ) I told her that Verlon AuLeslie would like for her to follow up with her pulmonologist and she said she does not have one. OK to refer her to one.  She also wanted to let Tana CoastLeslie Lewis, PA know that she has vomited for the last couple of days. Cannot eat anything. It took her all day yesterday to get one glass of tea down. Said she has to take a sip and wait at least 30 min and take another sip. The phenergan is not helping. Please advise!

## 2016-02-21 NOTE — Telephone Encounter (Signed)
Tried to call. Vm not set up. Mailing a letter to call.  

## 2016-02-25 ENCOUNTER — Encounter (HOSPITAL_COMMUNITY): Payer: Self-pay | Admitting: Gastroenterology

## 2016-02-25 NOTE — Telephone Encounter (Signed)
Vm from pt to return her call. I called and got a Vm that the naumber has been changed or disconnected.

## 2016-02-26 ENCOUNTER — Other Ambulatory Visit: Payer: Self-pay

## 2016-02-27 ENCOUNTER — Telehealth: Payer: Self-pay | Admitting: Gastroenterology

## 2016-02-27 ENCOUNTER — Other Ambulatory Visit: Payer: Self-pay | Admitting: Gastroenterology

## 2016-02-27 ENCOUNTER — Encounter: Payer: Self-pay | Admitting: Gastroenterology

## 2016-02-27 NOTE — Telephone Encounter (Signed)
[facility] [address]. I tried to call pt and number not working.

## 2016-02-27 NOTE — Telephone Encounter (Signed)
Pt's boyfriend, Alvia Groveorman Scott, called back. I informed him of the ULTRAM and Vicious Lidocaine ( see note of 02/18/2016. He said pt never picked up any meds in [place]. He will tell her to [facility]. He said the pt's phone is not working now, it has been disconnected.

## 2016-02-27 NOTE — Telephone Encounter (Signed)
PT's phone is disconnected. Mailing a letter to call for results.

## 2016-02-27 NOTE — Telephone Encounter (Signed)
APPT MADE AND LETTER SENT  °

## 2016-02-27 NOTE — Telephone Encounter (Signed)
REVIEWED-NO ADDITIONAL RECOMMENDATIONS. 

## 2016-02-27 NOTE — Telephone Encounter (Signed)
She can ask the pharmacist to transfer them if she needs [facility] another [facility].

## 2016-02-27 NOTE — Telephone Encounter (Signed)
Please call pt. HER stomach Bx shows gastritis. HER ABDOMINAL PAIN, NAUSEA,A ND VOMITING IS DUE TO REFLUX, ULCERS, GASTRITIS, AND DUODENITIS.     DRINK WATER TO KEEP YOUR URINE LIGHT YELLOW.  STRICTLY AVOID ASPIRIN, BC/GOODY POWDERS, IBUPROFEN/MOTRIN, OR NAPROXEN/ALEVE FOR 3 WEEKS BECAUSE YOU HAVE A ULCERS IN YOUR STOMACH.  FOLLOW A LOW FAT/SOFT MECHNICAL DIET.  MEATS SHOULD BE CHOPPED OR GROUND ONLY. DO NOT EAT CHUNKS OF ANYTHING.    IF YOU HAVE VOMITING, FOLLOW A FULL LIQUID DIET ONLY.   USE TYLENOL & ULTRAM AS NEEDED FOR PAIN.  USE CARAFATE AS NEEDED OR VISCOUS LIDOCAINE AS NEEDED FOR PAIN.  USE VISCOUS LIDOCAINE 2 TSP Q4-6H PRN FOR CHEST, OR UPPER ABDOMINAL PAIN OR HEARTBURN. USE NO MORE THAN 8 DOSES A DAY. IT WILL MAKE YOUR MOUTH, ESOPHAGUS, AND STOMACH NUMB.  AVOID ALCOHOL.    USE PHENERGAN AS NEEDED FOR NAUSEA OR VOMITING.  CONTINUE PROTONIX BUT INCREASE DOSE AND TAKE 30 MINUTES PRIOR TO YOUR MEALS TWICE DAILY.  You should MAKE A follow up appt with Dr. Lorin PicketWestcott. YOU MAY NEED A HIATAL HERNIA REPAIR.  FOLLOW UP IN 4 MOS E30 NAUSEA/VOMTIING/ABDOMINAL PAIN.

## 2016-02-27 NOTE — Telephone Encounter (Signed)
PATIENT CALLED STATING THAT HER PRESCRIPTION WAS NOT AT THE PHARMACY YESTERDAY

## 2016-02-28 ENCOUNTER — Telehealth: Payer: Self-pay | Admitting: Gastroenterology

## 2016-02-28 MED ORDER — TRAMADOL HCL 50 MG PO TABS: ORAL_TABLET | ORAL | Status: DC

## 2016-02-28 MED ORDER — ONDANSETRON HCL 4 MG PO TABS: 4.0000 mg | ORAL_TABLET | Freq: Four times a day (QID) | ORAL | Status: DC | PRN

## 2016-02-28 NOTE — Telephone Encounter (Signed)
Patient is speaking with Tyler Aasoris. Please see telephone encounter from 02/27/16

## 2016-02-28 NOTE — Telephone Encounter (Signed)
Pt was aware of results. She is aware that Tana CoastLeslie Lewis, PA did receive the refill request for Ultram and she will fill it x 1. [facility] [place] and I called and confirmed that it had been received.

## 2016-02-28 NOTE — Telephone Encounter (Signed)
Pt had called yesterday saying that her prescriptions should've went to a different pharmacy and not the one we had sent them too. Patient was told to call the pharmacy and they could transfer prescription to correct pharmacy. Patient called again today saying that she had her prescriptions transferred and all that was there was lidocaine, phenergan and zofran and she needed her Ultram.  I transferred call to CM

## 2016-02-28 NOTE — Telephone Encounter (Signed)
I spoke to pt when she called. Her phone is still disconnected and she could not give me a number to call. I informed her of the results. She said she had to take Ibuprofen for a couple of days because she was out of the Tramadol. I told her NOT to take the Ibuprofen, to take tylenol instead if she needed it.  She is aware that I will fax the [facility] [place] when Tana CoastLeslie Lewis, PA completes it in the refill box today. Verlon AuLeslie could not do it last night because it will print and has to be done from here.

## 2016-03-02 ENCOUNTER — Telehealth: Payer: Self-pay | Admitting: Gastroenterology

## 2016-03-02 MED ORDER — PANTOPRAZOLE SODIUM 40 MG PO TBEC: 40.0000 mg | DELAYED_RELEASE_TABLET | Freq: Two times a day (BID) | ORAL | Status: DC

## 2016-03-02 NOTE — Telephone Encounter (Signed)
Pt called back and said she is taking the Tramadol, but she is still having abdominal and her abdomin is swollen. She had diarrhea x 4 yesterday and some today. She had some bright red blood in the stool once yesterday and once today. She vomited x 1 today and had some bright red blood.  Please advise!

## 2016-03-02 NOTE — Telephone Encounter (Signed)
LMOM for a return call.  

## 2016-03-02 NOTE — Telephone Encounter (Signed)
Error, did not mean to send to [place].

## 2016-03-02 NOTE — Addendum Note (Signed)
Addended by: Tiffany KocherLEWIS, Wilmar Prabhakar S on: 03/02/2016 03:51 PM   Modules accepted: Orders

## 2016-03-02 NOTE — Telephone Encounter (Signed)
Looking back thru her chart, it is not clear that we ever made her referral for pulmonary nodules. Can we check on this?  For abd pain/vomiting/hematemsis/brbpr:  1. Avoid NSAIDS/ASA and etoh. 2. With nausea and or vomiting, back down to clear liquids only and advance slowly to low fat/soft diet when tolerated.  3. Continue carafate and/or viscous lidocaine for abdominal pain. 4. Continue phenergan every 6 hours and/or zofran every 6 hours, alternate. 5. Continue PPI BID. RX sent in per her request. 6. Go to the emergency room if she sees a lot of blood in emesis or per rectum OR see black tarry stool, feels dizzy, lightheaded, clammy, or worsening abdominal pain.  7. When is her ov with Dr. Carolynn SayersWescott.

## 2016-03-02 NOTE — Telephone Encounter (Signed)
Forwarding to Tana CoastLeslie Lewis, PA for recommendations.

## 2016-03-02 NOTE — Telephone Encounter (Signed)
Pt called to say that she was running low on her Protonix and needed [facility] [place]. She is also saying that for the past couple of days she has been in intense pain and she has noticed blood in her stool and vomit. Please call [phone]

## 2016-03-03 ENCOUNTER — Other Ambulatory Visit: Payer: Self-pay

## 2016-03-03 DIAGNOSIS — R918 Other nonspecific abnormal finding of lung field: Secondary | ICD-10-CM

## 2016-03-03 NOTE — Telephone Encounter (Signed)
Pt is aware of recommendations. OV with Dr. Carolynn SayersWescott on 03/11/2016 at 9:30 Am per Ginger. Ginger said she is making the referral for the pulmonary nodules.

## 2016-03-03 NOTE — Telephone Encounter (Signed)
Noted  

## 2016-03-06 ENCOUNTER — Other Ambulatory Visit: Payer: Self-pay | Admitting: Gastroenterology

## 2016-03-10 ENCOUNTER — Telehealth: Payer: Self-pay | Admitting: Gastroenterology

## 2016-03-10 NOTE — Telephone Encounter (Signed)
Received a request for Ultram.  Will not be filling any further narcotics. Multiple phone notes reviewed. Concern for drug-seeking behaviors.

## 2016-03-10 NOTE — Telephone Encounter (Signed)
Tried to call pt, number not working.

## 2016-03-11 NOTE — Telephone Encounter (Signed)
I have never seen patient. Routing to [place] and any further recommendations.

## 2016-03-11 NOTE — Telephone Encounter (Signed)
Agree with Anna's assessment. Drug-seeking behaviors noted. Also reviewed [place] controlled substance reporting system with multiple narcotics filled from multiple providers and at multiple pharmacies.  No further narcotics from me.   Hopefully she discussed her pain with Dr. Carolynn SayersWescott at her appointment today. Stop lidocaine if causing mouth sores.

## 2016-03-11 NOTE — Telephone Encounter (Signed)
Pt called and I informed her that Tobi Bastosnna was not going to give any further narcotics. She said what is she going to do, since she has sores in her mouth since she started the vicious lidocaine. Said Tylenol does not help. I told her that I would let Tobi Bastosnna know, and that she is off today. She gave me her updated phone number and I put it in epic, [phone].

## 2016-03-12 NOTE — Telephone Encounter (Signed)
I called and informed the pt of Shelby Hatfield's info.  She said she she is going to the ED that she has started bleeding again, and vomiting and she has dark stool. I told her that I would let Shelby AuLeslie know.

## 2016-03-13 NOTE — Telephone Encounter (Signed)
Agree with er evaluation

## 2016-05-21 ENCOUNTER — Encounter (HOSPITAL_COMMUNITY): Payer: Self-pay | Admitting: *Deleted

## 2016-05-21 ENCOUNTER — Emergency Department (HOSPITAL_COMMUNITY)
Admission: EM | Admit: 2016-05-21 | Discharge: 2016-05-21 | Disposition: A | Discharge status: Home / Self Care | Payer: Medicaid Other | Attending: Emergency Medicine | Admitting: Emergency Medicine

## 2016-05-21 ENCOUNTER — Emergency Department (HOSPITAL_COMMUNITY): Payer: Medicaid Other

## 2016-05-21 DIAGNOSIS — F17210 Nicotine dependence, cigarettes, uncomplicated: Secondary | ICD-10-CM | POA: Insufficient documentation

## 2016-05-21 DIAGNOSIS — S93401A Sprain of unspecified ligament of right ankle, initial encounter: Secondary | ICD-10-CM | POA: Diagnosis not present

## 2016-05-21 DIAGNOSIS — W010XXA Fall on same level from slipping, tripping and stumbling without subsequent striking against object, initial encounter: Secondary | ICD-10-CM | POA: Insufficient documentation

## 2016-05-21 DIAGNOSIS — Y929 Unspecified place or not applicable: Secondary | ICD-10-CM | POA: Insufficient documentation

## 2016-05-21 DIAGNOSIS — J449 Chronic obstructive pulmonary disease, unspecified: Secondary | ICD-10-CM | POA: Diagnosis not present

## 2016-05-21 DIAGNOSIS — E119 Type 2 diabetes mellitus without complications: Secondary | ICD-10-CM | POA: Diagnosis not present

## 2016-05-21 DIAGNOSIS — S99911A Unspecified injury of right ankle, initial encounter: Secondary | ICD-10-CM | POA: Diagnosis present

## 2016-05-21 DIAGNOSIS — Y999 Unspecified external cause status: Secondary | ICD-10-CM | POA: Insufficient documentation

## 2016-05-21 DIAGNOSIS — Y939 Activity, unspecified: Secondary | ICD-10-CM | POA: Diagnosis not present

## 2016-05-21 DIAGNOSIS — I5033 Acute on chronic diastolic (congestive) heart failure: Secondary | ICD-10-CM | POA: Insufficient documentation

## 2016-05-21 MED ORDER — IBUPROFEN 400 MG PO TABS
ORAL_TABLET | ORAL | Status: AC
  Filled 2016-05-21: qty 1

## 2016-05-21 MED ORDER — IBUPROFEN 400 MG PO TABS
400.0000 mg | ORAL_TABLET | Freq: Once | ORAL | Status: AC | PRN
  Administered 2016-05-21: 400 mg via ORAL

## 2016-05-21 MED ORDER — OXYCODONE-ACETAMINOPHEN 5-325 MG PO TABS
1.0000 | ORAL_TABLET | Freq: Once | ORAL | Status: AC
  Administered 2016-05-21: 1 via ORAL
  Filled 2016-05-21: qty 1

## 2016-05-21 NOTE — ED Provider Notes (Signed)
[facility] DEPT Provider Note   CSN: [id number] Arrival date & time: 05/21/16  0118     History   Chief Complaint Chief Complaint  Patient presents with  . Foot Pain    HPI Shelby Hatfield is a 40 y.o. female.  HPI  40 y.o. female presents to the Emergency Department today complaining of right foot pain s/p slipping on wet stairs. Ankle turned inward. Occurred x 2 hours go. Reports pain 9/10. Sharp in nature. Worse with movement. OTC with minimal relief. Able to bear weight. No other symptoms noted.   Past Medical History:  Diagnosis Date  . Adenotonsillar hypertrophy 03/2012   snores during sleep; denies apnea; states occ. wakes up coughing  . Anemia   . Anxiety   . Bipolar 1 disorder ([place])   . 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 Depression   . Diabetes mellitus without complication ([place])   . Hypercholesterolemia   . Migraines   . Obesity   . Pneumonia   . Wears dentures    upper denture    Patient Active Problem List   Diagnosis Date Noted  . Abdominal pain, epigastric 02/12/2016  . Microcytic anemia 02/12/2016  . Hematemesis 02/12/2016  . Pulmonary nodules 02/12/2016  . Acute on chronic diastolic heart failure ([place]) 01/21/2015  . 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 Nausea with vomiting 01/20/2015  . 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 SOB (shortness of breath) 01/19/2015  . Lower leg edema 01/19/2015  . Diarrhea   . Type 2 diabetes mellitus without complication ([place])   . COPD (chronic obstructive pulmonary disease) ([place]) 08/18/2014  . COPD exacerbation ([place]) 08/18/2014  . Chest tightness 08/18/2014  . Shortness of breath   . Opiate addiction ([place]) 05/22/2014  . 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 Opiate dependence, continuous ([place]) 04/20/2013  . Benzodiazepine dependence ([place]) 04/20/2013    Past Surgical History:    Procedure Laterality Date  . APPENDECTOMY    . BIOPSY  02/14/2016   Procedure: BIOPSY;  Surgeon: Danie Binder, MD;  Location[facility];  Service: Endoscopy;;  gastric biopsies  . CESAREAN SECTION  08/31/2001   total of  3  . CHOLECYSTECTOMY    . DILATION AND EVACUATION  04/09/2000  . ESOPHAGOGASTRODUODENOSCOPY (EGD) WITH PROPOFOL N/A 02/14/2016   Procedure: ESOPHAGOGASTRODUODENOSCOPY (EGD) WITH PROPOFOL;  Surgeon: Danie Binder, MD;  Location: [facility];  Service: Endoscopy;  Laterality: N/A;  1315  . open paraesophageal hernia repair with gastroexy  09/2015   Dr. Demetrio Lapping  . TONSILLECTOMY    . TONSILLECTOMY AND ADENOIDECTOMY  04/12/2012   Procedure: TONSILLECTOMY AND ADENOIDECTOMY;  Surgeon: Ascencion Dike, MD;  Location: [facility];  Service: ENT;  Laterality: Bilateral;  . TUBAL LIGATION  08/31/2001    OB History    Gravida Para Term Preterm AB Living   '8 3 3   5 3   '$ SAB TAB Ectopic Multiple Live Births   5               Home Medications    Prior to Admission medications   Medication Sig Start Date End Date Taking? Authorizing Provider  Blood Glucose Monitoring Suppl (BLOOD GLUCOSE METER KIT AND SUPPLIES) Dispense based on patient and insurance preference. Use to check sugar two times a day (fasting in am and at bedtime). (FOR ICD-9  250.00, 250.01). Please provide the glucometer with cheapest strips Patient not taking: Reported on 02/12/2016 08/21/14   Vassie Loll, MD  budesonide-formoterol [facility]) 160-4.5 MCG/ACT inhaler Inhale 2 puffs into the lungs 2 (two) times daily.    Historical Provider, MD  lidocaine (XYLOCAINE) 2 % solution Use as directed 10 mLs in the mouth or throat every 4 (four) hours as needed (abdominal pain.  No more than 8 doses per day). 02/18/16   West Bali, MD  ondansetron (ZOFRAN) 4 MG tablet Take 1 tablet (4 mg total) by mouth every 6 (six) hours as needed for nausea or vomiting. 02/28/16   Tiffany Kocher, PA-C   oxyCODONE-acetaminophen (PERCOCET) 5-325 MG tablet Take 1 tablet by mouth every 4 (four) hours as needed for moderate pain. 02/12/16   Tiffany Kocher, PA-C  pantoprazole (PROTONIX) 40 MG tablet Take 1 tablet (40 mg total) by mouth 2 (two) times daily. 03/02/16   Tiffany Kocher, PA-C  promethazine (PHENERGAN) 25 MG tablet Take 0.5-1 tablets (12.5-25 mg total) by mouth every 6 (six) hours as needed for nausea or vomiting. 02/16/16   Burgess Amor, PA-C  sucralfate (CARAFATE) 1 g tablet Take 1 tablet (1 g total) by mouth 4 (four) times daily -  with meals and at bedtime. 02/10/16   Dione Booze, MD  traMADol (ULTRAM) 50 MG tablet 1-2 po q6h prn pain. NO MORE THAN 8 TABS IN 24 HOURS 02/28/16   Tiffany Kocher, PA-C    Family History Family History  Problem Relation Age of Onset  . Cancer Other   . Hypertension Mother   . Heart disease Mother   . Cancer Mother   . Cancer Maternal Aunt     not sure primary, in kidney and colon  . Cancer Maternal Uncle     lung cancer   . Stroke Maternal Grandfather     Social History Social History  Substance Use Topics  . Smoking status: Current Some Day Smoker    Packs/day: 0.33    Years: 18.00    Types: Cigarettes  . Smokeless tobacco: Never Used  . Alcohol use 0.0 oz/week     Comment: occasionally, twice a year, holiday     Allergies   Penicillins   Review of Systems Review of Systems  Constitutional: Negative for fever.  Gastrointestinal: Negative for nausea.  Musculoskeletal: Positive for arthralgias and joint swelling.   Physical Exam Updated Vital Signs BP 112/72 (BP Location: Left Arm)   Pulse 96   Temp 98 F (36.7 C) (Oral)   Resp 19   Ht 5\' 1"  (1.549 m)   Wt 73.5 kg   LMP 05/04/2016   SpO2 95%   BMI 30.61 kg/m   Physical Exam  Constitutional: She is oriented to person, place, and time. Vital signs are normal. She appears well-developed and well-nourished.  HENT:  Head: Normocephalic.  Right Ear: Hearing normal.  Left Ear:  Hearing normal.  Eyes: Conjunctivae and EOM are normal. Pupils are equal, round, and reactive to light.  Cardiovascular: Normal rate and regular rhythm.   Pulmonary/Chest: Effort normal.  Musculoskeletal:  Right ankle with ROM intact. Neurovascularly intact. Pain below lateral malleolus. Able to bear weight on affected ankle. Motor/sensation intact.    Neurological: She is alert and oriented to person, place, and time.  Skin: Skin is warm and dry.  Psychiatric: She has a normal mood and affect. Her speech is normal and behavior is normal. Thought content normal.   ED Treatments /  Results  Labs (all labs ordered are listed, but only abnormal results are displayed) Labs Reviewed - No data to display  EKG  EKG Interpretation None       Radiology Dg Ankle Complete Right  Result Date: 05/21/2016 CLINICAL DATA:  Fall tonight with right ankle pain. Initial encounter. EXAM: RIGHT ANKLE - COMPLETE 3+ VIEW COMPARISON:  None. FINDINGS: There is no evidence of fracture, dislocation, or joint effusion. Nonspecific subcutaneous calcification in the lower leg. IMPRESSION: Negative. Electronically Signed   By: Monte Fantasia M.D.   On: 05/21/2016 02:04   Dg Foot Complete Right  Result Date: 05/21/2016 CLINICAL DATA:  Fall with right ankle pain.  Initial encounter. EXAM: RIGHT FOOT COMPLETE - 3+ VIEW COMPARISON:  None. FINDINGS: There is no evidence of fracture or dislocation. There is no evidence of arthropathy or other focal bone abnormality. Soft tissues are unremarkable. IMPRESSION: Negative. Electronically Signed   By: Monte Fantasia M.D.   On: 05/21/2016 02:05    Procedures Procedures (including critical care time)  Medications Ordered in ED Medications  ibuprofen (ADVIL,MOTRIN) 400 MG tablet (not administered)  ibuprofen (ADVIL,MOTRIN) tablet 400 mg (400 mg Oral Given 05/21/16 0135)     Initial Impression / Assessment and Plan / ED Course  I have reviewed the triage vital signs and  the nursing notes.  Pertinent labs & imaging results that were available during my care of the patient were reviewed by me and considered in my medical decision making (see chart for details).  Clinical Course    Final Clinical Impressions(s) / ED Diagnoses  I have reviewed and evaluated the relevant imaging studies.  I have reviewed the relevant previous healthcare records. I obtained HPI from historian.  ED Course:  Assessment: Pt is a 40yF who presents with left ankle pain s/p mechanical fall today. On exam, pt in NAD. Nontoxic/nonseptic appearing. VSS. Afebrile. ROM intact. Neurovascularly intact. Imaging without acute abnormalities. Likely ankle sprain. Given ankle brace in ED as well as crutches. Plan is to [place] with follow up to PCP. At time of discharge, Patient is in no acute distress. Vital Signs are stable. Patient is able to ambulate. Patient able to tolerate PO.    Disposition/Plan:  DC Home Additional Verbal discharge instructions given and discussed with patient.  Pt Instructed to f/u with PCP in the next week for evaluation and treatment of symptoms. Return precautions given Pt acknowledges and agrees with plan  Supervising Physician Orpah Greek, MD   Final diagnoses:  Ankle sprain, right, initial encounter    New Prescriptions New Prescriptions   No medications on file     Shelby Decamp, PA-C 05/21/16 [place], MD 05/22/16 [phone]

## 2016-05-21 NOTE — ED Triage Notes (Signed)
Pt slipped on wet steps and fell onto R ankle. Reports sharp pain in foot when attempting to walk. Swelling and bruising noted

## 2016-05-21 NOTE — Discharge Instructions (Signed)
Please read and follow all provided instructions.  Your diagnoses today include:  1. Ankle sprain, right, initial encounter    Tests performed today include: Vital signs. See below for your results today.   Medications prescribed:  Take as prescribed. You can use Ibuprofen 400mg  combined with Tylenol 1000mg  for pain relief every 6 hours. Do not exceed 4g of Tylenol in one 24 hour period. Do not exceed 10 days of this regiment.  Home care instructions:  Follow any educational materials contained in this packet.  Follow-up instructions: Please follow-up with your primary care provider for further evaluation of symptoms and treatment   Return instructions:  Please return to the Emergency Department if you do not get better, if you get worse, or new symptoms OR  - Fever (temperature greater than 101.37F)  - Bleeding that does not stop with holding pressure to the area    -Severe pain (please note that you may be more sore the day after your accident)  - Chest Pain  - Difficulty breathing  - Severe nausea or vomiting  - Inability to tolerate food and liquids  - Passing out  - Skin becoming red around your wounds  - Change in mental status (confusion or lethargy)  - New numbness or weakness    Please return if you have any other emergent concerns.  Additional Information:  Your vital signs today were: BP 112/72 (BP Location: Left Arm)    Pulse 96    Temp 98 F (36.7 C) (Oral)    Resp 19    Ht 5\' 1"  (1.549 m)    Wt 73.5 kg    LMP 05/04/2016    SpO2 95%    BMI 30.61 kg/m  If your blood pressure (BP) was elevated above 135/85 this visit, please have this repeated by your doctor within one month. ---------------

## 2016-05-21 NOTE — Progress Notes (Signed)
Orthopedic Tech Progress Note Patient Details:  [name] S Hottel 11-05-1975 [id number]  Ortho Devices Type of Ortho Device: ASO, Crutches Ortho Device/Splint Location: rle  Ortho Device/Splint Interventions: Ordered, Application   Trinna PostMartinez, Tatelyn Vanhecke J 05/21/2016, 2:46 AM

## 2016-05-31 ENCOUNTER — Inpatient Hospital Stay (HOSPITAL_COMMUNITY)
Admission: EM | Admit: 2016-05-31 | Discharge: 2016-06-04 | DRG: 377 | Disposition: A | Discharge status: Home / Self Care | Payer: Medicaid Other | Attending: Internal Medicine | Admitting: Internal Medicine

## 2016-05-31 ENCOUNTER — Encounter (HOSPITAL_COMMUNITY): Payer: Self-pay | Admitting: Nurse Practitioner

## 2016-05-31 DIAGNOSIS — F319 Bipolar disorder, unspecified: Secondary | ICD-10-CM | POA: Diagnosis present

## 2016-05-31 DIAGNOSIS — Z9981 Dependence on supplemental oxygen: Secondary | ICD-10-CM

## 2016-05-31 DIAGNOSIS — J449 Chronic obstructive pulmonary disease, unspecified: Secondary | ICD-10-CM | POA: Diagnosis present

## 2016-05-31 DIAGNOSIS — E669 Obesity, unspecified: Secondary | ICD-10-CM | POA: Diagnosis present

## 2016-05-31 DIAGNOSIS — K922 Gastrointestinal hemorrhage, unspecified: Secondary | ICD-10-CM | POA: Diagnosis present

## 2016-05-31 DIAGNOSIS — K449 Diaphragmatic hernia without obstruction or gangrene: Secondary | ICD-10-CM

## 2016-05-31 DIAGNOSIS — K257 Chronic gastric ulcer without hemorrhage or perforation: Secondary | ICD-10-CM | POA: Diagnosis present

## 2016-05-31 DIAGNOSIS — K2100 Gastro-esophageal reflux disease with esophagitis, without bleeding: Secondary | ICD-10-CM

## 2016-05-31 DIAGNOSIS — R1013 Epigastric pain: Secondary | ICD-10-CM

## 2016-05-31 DIAGNOSIS — I5032 Chronic diastolic (congestive) heart failure: Secondary | ICD-10-CM | POA: Diagnosis present

## 2016-05-31 DIAGNOSIS — Z880 Allergy status to penicillin: Secondary | ICD-10-CM

## 2016-05-31 DIAGNOSIS — K921 Melena: Principal | ICD-10-CM | POA: Diagnosis present

## 2016-05-31 DIAGNOSIS — E43 Unspecified severe protein-calorie malnutrition: Secondary | ICD-10-CM | POA: Insufficient documentation

## 2016-05-31 DIAGNOSIS — E1169 Type 2 diabetes mellitus with other specified complication: Secondary | ICD-10-CM

## 2016-05-31 DIAGNOSIS — K279 Peptic ulcer, site unspecified, unspecified as acute or chronic, without hemorrhage or perforation: Secondary | ICD-10-CM | POA: Diagnosis present

## 2016-05-31 DIAGNOSIS — E785 Hyperlipidemia, unspecified: Secondary | ICD-10-CM | POA: Diagnosis present

## 2016-05-31 DIAGNOSIS — F17210 Nicotine dependence, cigarettes, uncomplicated: Secondary | ICD-10-CM | POA: Diagnosis present

## 2016-05-31 DIAGNOSIS — Z79899 Other long term (current) drug therapy: Secondary | ICD-10-CM

## 2016-05-31 DIAGNOSIS — D649 Anemia, unspecified: Secondary | ICD-10-CM

## 2016-05-31 DIAGNOSIS — Z6837 Body mass index (BMI) 37.0-37.9, adult: Secondary | ICD-10-CM

## 2016-05-31 DIAGNOSIS — E119 Type 2 diabetes mellitus without complications: Secondary | ICD-10-CM

## 2016-05-31 DIAGNOSIS — D62 Acute posthemorrhagic anemia: Secondary | ICD-10-CM | POA: Diagnosis present

## 2016-05-31 DIAGNOSIS — Z7951 Long term (current) use of inhaled steroids: Secondary | ICD-10-CM

## 2016-05-31 DIAGNOSIS — R109 Unspecified abdominal pain: Secondary | ICD-10-CM | POA: Diagnosis present

## 2016-05-31 DIAGNOSIS — R319 Hematuria, unspecified: Secondary | ICD-10-CM | POA: Diagnosis present

## 2016-05-31 DIAGNOSIS — Z7984 Long term (current) use of oral hypoglycemic drugs: Secondary | ICD-10-CM

## 2016-05-31 DIAGNOSIS — F419 Anxiety disorder, unspecified: Secondary | ICD-10-CM | POA: Diagnosis present

## 2016-05-31 DIAGNOSIS — K2960 Other gastritis without bleeding: Secondary | ICD-10-CM | POA: Diagnosis present

## 2016-05-31 DIAGNOSIS — Z9049 Acquired absence of other specified parts of digestive tract: Secondary | ICD-10-CM

## 2016-05-31 LAB — COMPREHENSIVE METABOLIC PANEL
ALBUMIN: 3.5 g/dL (ref 3.5–5.0)
ALK PHOS: 63 U/L (ref 38–126)
ALT: 11 U/L — AB (ref 14–54)
ANION GAP: 8 (ref 5–15)
AST: 16 U/L (ref 15–41)
BILIRUBIN TOTAL: 0.3 mg/dL (ref 0.3–1.2)
BUN: 12 mg/dL (ref 6–20)
CALCIUM: 8.8 mg/dL — AB (ref 8.9–10.3)
CO2: 19 mmol/L — AB (ref 22–32)
CREATININE: 0.86 mg/dL (ref 0.44–1.00)
Chloride: 110 mmol/L (ref 101–111)
GFR calc non Af Amer: >60 mL/min (ref >60)
GLUCOSE: 87 mg/dL (ref 65–99)
Potassium: 3.6 mmol/L (ref 3.5–5.1)
SODIUM: 137 mmol/L (ref 135–145)
TOTAL PROTEIN: 6.1 g/dL — AB (ref 6.5–8.1)

## 2016-05-31 LAB — URINALYSIS, ROUTINE W REFLEX MICROSCOPIC
BILIRUBIN URINE: NEGATIVE
Glucose, UA: NEGATIVE mg/dL
KETONES UR: NEGATIVE mg/dL
Leukocytes, UA: NEGATIVE
NITRITE: NEGATIVE
PROTEIN: NEGATIVE mg/dL
Specific Gravity, Urine: 1.034 — ABNORMAL HIGH (ref 1.005–1.030)
pH: 5.5 (ref 5.0–8.0)

## 2016-05-31 LAB — CBC
HCT: 27.9 % — ABNORMAL LOW (ref 36.0–46.0)
Hemoglobin: 7.7 g/dL — ABNORMAL LOW (ref 12.0–15.0)
MCH: 19.7 pg — AB (ref 26.0–34.0)
MCHC: 27.6 g/dL — AB (ref 30.0–36.0)
MCV: 71.5 fL — ABNORMAL LOW (ref 78.0–100.0)
PLATELETS: 396 10*3/uL (ref 150–400)
RBC: 3.9 MIL/uL (ref 3.87–5.11)
RDW: 22 % — ABNORMAL HIGH (ref 11.5–15.5)
WBC: 8.3 10*3/uL (ref 4.0–10.5)

## 2016-05-31 LAB — URINE MICROSCOPIC-ADD ON

## 2016-05-31 LAB — I-STAT CG4 LACTIC ACID, ED

## 2016-05-31 LAB — POC OCCULT BLOOD, ED: Fecal Occult Bld: POSITIVE — AB

## 2016-05-31 MED ORDER — SODIUM CHLORIDE 0.9 % IV BOLUS (SEPSIS)
1000.0000 mL | Freq: Once | INTRAVENOUS | Status: AC
  Administered 2016-05-31: 1000 mL via INTRAVENOUS

## 2016-05-31 MED ORDER — GI COCKTAIL ~~LOC~~
30.0000 mL | Freq: Once | ORAL | Status: AC
  Administered 2016-05-31: 30 mL via ORAL
  Filled 2016-05-31: qty 30

## 2016-05-31 MED ORDER — METOCLOPRAMIDE HCL 5 MG/ML IJ SOLN
10.0000 mg | Freq: Once | INTRAMUSCULAR | Status: AC
  Administered 2016-05-31: 10 mg via INTRAVENOUS
  Filled 2016-05-31: qty 2

## 2016-05-31 MED ORDER — KETOROLAC TROMETHAMINE 30 MG/ML IJ SOLN
30.0000 mg | Freq: Once | INTRAMUSCULAR | Status: AC
  Administered 2016-05-31: 30 mg via INTRAVENOUS
  Filled 2016-05-31: qty 1

## 2016-05-31 NOTE — ED Provider Notes (Signed)
[facility] DEPT Provider Note   CSN: [id number] Arrival date & time: 05/31/16  1903    History   Chief Complaint Chief Complaint  Patient presents with  . Abdominal Pain    HPI Shelby MARGRETTA Hatfield is a 40 y.o. female.  40 year old female with a history of anemia, anxiety, bipolar 1 disorder, COPD, diabetes mellitus, and dyslipidemia presents to the emergency department for evaluation of abdominal pain. Patient reports constant, waxing and waning pain in her epigastrium. Symptoms have been present over the past 3 days, but worsened today. She states that she has taken Tylenol and ibuprofen for symptoms without relief. She has been unable to tolerate solid foods without feeling as though some of the food is stuck at the end of her esophagus. She has been able to drink fluids and tolerate ice chips. She does report nausea and some vomiting as well as dark, constipated stool. No reported hematochezia. Patient reports eating and drinking less secondary to her symptoms. She believes that her symptoms are secondary to complications from a recent hiatal hernia repair in January 2017. She states that she is scheduled to have repeat surgery for this issue. Abdominal surgical history also significant for appendectomy, cesarean section, and cholecystectomy. Patient denies fever, syncope, urinary symptoms, or vaginal complaints.      Past Medical History:  Diagnosis Date  . Adenotonsillar hypertrophy 03/2012   snores during sleep; denies apnea; states occ. wakes up coughing  . Anemia   . Anxiety   . Bipolar 1 disorder ([place])   . 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 Depression   . Diabetes mellitus without complication ([place])   . Hypercholesterolemia   . Migraines   . Obesity   . Pneumonia   . Wears dentures    upper denture    Patient Active Problem List   Diagnosis Date Noted  . Abdominal pain, epigastric 02/12/2016  .  Microcytic anemia 02/12/2016  . Hematemesis 02/12/2016  . Pulmonary nodules 02/12/2016  . Acute on chronic diastolic heart failure ([place]) 01/21/2015  . 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 Nausea with vomiting 01/20/2015  . 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 SOB (shortness of breath) 01/19/2015  . Lower leg edema 01/19/2015  . Diarrhea   . Type 2 diabetes mellitus without complication ([place])   . COPD (chronic obstructive pulmonary disease) ([place]) 08/18/2014  . COPD exacerbation ([place]) 08/18/2014  . Chest tightness 08/18/2014  . Shortness of breath   . Opiate addiction ([place]) 05/22/2014  . 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 Mixed bipolar I disorder ([place]) 04/20/2013  . Opiate dependence, continuous ([place]) 04/20/2013  . Benzodiazepine dependence ([place]) 04/20/2013    Past Surgical History:  Procedure Laterality Date  . APPENDECTOMY    . BIOPSY  02/14/2016   Procedure: BIOPSY;  Surgeon: Danie Binder, MD;  Location: [facility];  Service: Endoscopy;;  gastric biopsies  . CESAREAN SECTION  08/31/2001   total of  3  . CHOLECYSTECTOMY    . DILATION AND EVACUATION  04/09/2000  . ESOPHAGOGASTRODUODENOSCOPY (EGD) WITH PROPOFOL N/A 02/14/2016   Procedure: ESOPHAGOGASTRODUODENOSCOPY (EGD) WITH PROPOFOL;  Surgeon: Danie Binder, MD;  Location: [facility];  Service: Endoscopy;  Laterality: N/A;  1315  . open paraesophageal hernia repair with gastroexy  09/2015   Dr. Demetrio Lapping  . TONSILLECTOMY    . TONSILLECTOMY AND ADENOIDECTOMY  04/12/2012   Procedure: TONSILLECTOMY AND ADENOIDECTOMY;  Surgeon:  Ascencion Dike, MD;  Location: [facility];  Service: ENT;  Laterality: Bilateral;  . TUBAL LIGATION  08/31/2001    OB History    Gravida Para Term Preterm AB Living   _0 SAB TAB Ectopic Multiple Live Births   5               Home Medications    Prior to Admission medications   Medication Sig Start Date End Date Taking? Authorizing Provider  Blood  Glucose Monitoring Suppl (BLOOD GLUCOSE METER KIT AND SUPPLIES) Dispense based on patient and insurance preference. Use to check sugar two times a day (fasting in am and at bedtime). (FOR ICD-9 250.00, 250.01). Please provide the glucometer with cheapest strips Patient not taking: Reported on 02/12/2016 08/21/14   Barton Dubois, MD  budesonide-formoterol [facility]) 160-4.5 MCG/ACT inhaler Inhale 2 puffs into the lungs 2 (two) times daily.    Historical Provider, MD  lidocaine (XYLOCAINE) 2 % solution Use as directed 10 mLs in the mouth or throat every 4 (four) hours as needed (abdominal pain.  No more than 8 doses per day). 02/18/16   Danie Binder, MD  ondansetron (ZOFRAN) 4 MG tablet Take 1 tablet (4 mg total) by mouth every 6 (six) hours as needed for nausea or vomiting. 02/28/16   Mahala Menghini, PA-C  oxyCODONE-acetaminophen (PERCOCET) 5-325 MG tablet Take 1 tablet by mouth every 4 (four) hours as needed for moderate pain. 02/12/16   Mahala Menghini, PA-C  pantoprazole (PROTONIX) 40 MG tablet Take 1 tablet (40 mg total) by mouth 2 (two) times daily. 03/02/16   Mahala Menghini, PA-C  promethazine (PHENERGAN) 25 MG tablet Take 0.5-1 tablets (12.5-25 mg total) by mouth every 6 (six) hours as needed for nausea or vomiting. 02/16/16   Evalee Jefferson, PA-C  sucralfate (CARAFATE) 1 g tablet Take 1 tablet (1 g total) by mouth 4 (four) times daily -  with meals and at bedtime. 1/70/01   Delora Fuel, MD  traMADol (ULTRAM) 50 MG tablet 1-2 po q6h prn pain. NO MORE THAN 8 TABS IN 24 HOURS 02/28/16   Mahala Menghini, PA-C    Family History Family History  Problem Relation Age of Onset  . Cancer Other   . Hypertension Mother   . Heart disease Mother   . Cancer Mother   . Cancer Maternal Aunt     not sure primary, in kidney and colon  . Cancer Maternal Uncle     lung cancer   . Stroke Maternal Grandfather     Social History Social History  Substance Use Topics  . Smoking status: Current Some Day Smoker     Packs/day: 0.33    Years: 18.00    Types: Cigarettes  . Smokeless tobacco: Never Used  . Alcohol use 0.0 oz/week     Comment: occasionally, twice a year, holiday     Allergies   Penicillins   Review of Systems Review of Systems  Constitutional: Negative for fever.  Gastrointestinal: Positive for abdominal pain, nausea and vomiting.  Genitourinary: Negative for dysuria and hematuria.  Ten systems reviewed and are negative for acute change, except as noted in the HPI.     Physical Exam Updated Vital Signs BP 127/79   Pulse 77   Temp 98.3 F (36.8 C) (Oral)   Resp 16   LMP 05/04/2016   SpO2 100%   Physical Exam  Constitutional: She is oriented to  person, place, and time. She appears well-developed and well-nourished. No distress.  Nontoxic appearing and in NAD  HENT:  Head: Normocephalic and atraumatic.  Eyes: Conjunctivae and EOM are normal. No scleral icterus.  Neck: Normal range of motion.  Cardiovascular: Normal rate, regular rhythm and intact distal pulses.   Pulmonary/Chest: Effort normal. No respiratory distress. She has no wheezes.  Respirations even and unlabored  Abdominal: Soft. There is tenderness.  Soft abdomen with generalized TTP; however, this is appreciated to be worse in the epigastrium. No masses. No peritoneal signs.  Genitourinary:  Genitourinary Comments: Normal rectal tone. Brown stool noted without gross blood. No melena on exam.  Musculoskeletal: Normal range of motion.  Neurological: She is alert and oriented to person, place, and time.  GCS 15. Patient moving all extremities.  Skin: Skin is warm and dry. No rash noted. She is not diaphoretic. No erythema. No pallor.  Psychiatric: She has a normal mood and affect. Her behavior is normal.  Nursing note and vitals reviewed.    ED Treatments / Results  Labs (all labs ordered are listed, but only abnormal results are displayed) Labs Reviewed  COMPREHENSIVE METABOLIC PANEL - Abnormal;  Notable for the following:       Result Value   CO2 19 (*)    Calcium 8.8 (*)    Total Protein 6.1 (*)    ALT 11 (*)    All other components within normal limits  CBC - Abnormal; Notable for the following:    Hemoglobin 7.7 (*)    HCT 27.9 (*)    MCV 71.5 (*)    MCH 19.7 (*)    MCHC 27.6 (*)    RDW 22.0 (*)    All other components within normal limits  URINALYSIS, ROUTINE W REFLEX MICROSCOPIC (NOT AT [facility]) - Abnormal; Notable for the following:    Specific Gravity, Urine 1.034 (*)    Hgb urine dipstick TRACE (*)    All other components within normal limits  URINE MICROSCOPIC-ADD ON - Abnormal; Notable for the following:    Squamous Epithelial / LPF 0-5 (*)    Bacteria, UA RARE (*)    All other components within normal limits  POC OCCULT BLOOD, ED - Abnormal; Notable for the following:    Fecal Occult Bld POSITIVE (*)    All other components within normal limits  I-STAT CG4 LACTIC ACID, ED    EKG  EKG Interpretation None       Radiology Ct Abdomen Pelvis W Contrast  Result Date: 06/01/2016 CLINICAL DATA:  Acute onset of mid upper abdominal pain and left upper abdominal pain, with nausea and vomiting. Initial encounter. EXAM: CT ABDOMEN AND PELVIS WITH CONTRAST TECHNIQUE: Multidetector CT imaging of the abdomen and pelvis was performed using the standard protocol following bolus administration of intravenous contrast. CONTRAST:  126m ISOVUE-300 IOPAMIDOL (ISOVUE-300) INJECTION 61% COMPARISON:  CT of the abdomen and pelvis from 02/10/2016 FINDINGS: The visualized lung bases are clear. A moderate hiatal hernia is noted. Scattered prominent nodes are seen about the hiatal hernia, with apparent mild gastric and esophageal varices. The liver and spleen are unremarkable in appearance. The patient is status post cholecystectomy, with clips noted at the gallbladder fossa. The pancreas and adrenal glands are unremarkable. The kidneys are unremarkable in appearance. There is no evidence of  hydronephrosis. No renal or ureteral stones are seen. No perinephric stranding is appreciated. No free fluid is identified. The small bowel is unremarkable in appearance. The stomach is within normal  limits. No acute vascular abnormalities are seen. The patient is status post appendectomy. The colon is grossly unremarkable in appearance. The bladder is mildly distended and grossly unremarkable. The uterus is unremarkable in appearance. The ovaries are relatively symmetric. No suspicious adnexal masses are seen. No inguinal lymphadenopathy is seen. No acute osseous abnormalities are identified. IMPRESSION: 1. Moderate hiatal hernia noted. 2. Scattered prominent nodes about the hiatal hernia, with apparent mild gastric and esophageal varices. Endoscopy could be considered for further evaluation, as deemed clinically appropriate. Electronically Signed   By: Garald Balding M.D.   On: 06/01/2016 01:42    Procedures Procedures (including critical care time)  Medications Ordered in ED Medications  pantoprazole (PROTONIX) 80 mg in sodium chloride 0.9 % 100 mL IVPB (not administered)  pantoprazole (PROTONIX) 80 mg in sodium chloride 0.9 % 250 mL (0.32 mg/mL) infusion (not administered)  pantoprazole (PROTONIX) injection 40 mg (not administered)  sodium chloride 0.9 % bolus 1,000 mL (1,000 mLs Intravenous New Bag/Given 05/31/16 2251)  ketorolac (TORADOL) 30 MG/ML injection 30 mg (30 mg Intravenous Given 05/31/16 2251)  metoCLOPramide (REGLAN) injection 10 mg (10 mg Intravenous Given 05/31/16 2251)  gi cocktail (Maalox,Lidocaine,Donnatal) (30 mLs Oral Given 05/31/16 2251)  iopamidol (ISOVUE-300) 61 % injection (100 mLs  Contrast Given 06/01/16 0112)     Initial Impression / Assessment and Plan / ED Course  I have reviewed the triage vital signs and the nursing notes.  Pertinent labs & imaging results that were available during my care of the patient were reviewed by me and considered in my medical decision making  (see chart for details).  Clinical Course    11:00 PM Patient assessed for epigastric pain. No evidence of acute surgical abdomen; however, it is possible that pain is corresponding with hiatal hernia repair site. Labs reassuring. No fever or leukocytosis. Patient is slightly more anemic compared to prior, but she has no melena or gross blood on DRE. Hemoccult results likely secondary to microscopic blood. Patient also without tachycardia or hypotension to suggest acute blood loss. Plan to CT to evaluate pain etiology. Toradol and Reglan ordered for symptom management.  12:06 AM Went to reassess patient to assess pain. Patient found to be sleeping. Will continue to monitor. Awaiting CT at this time.  1:40 AM Case discussed with Dr. Ellender Hose who has evaluated the patient. Patient with endoscopy in May 2017 by Dr. Oneida Alar of [facility]. Endoscopy reportedly shows a cratered gastric ulcer in the fundus with multiple other erosions. It is likely that slow Hgb drop is secondary to these areas of ulceration. Plan to admit for serial hemoglobins and GI consult in AM as patient will likely require an additional endoscopy. CT results pending.  1:52 AM CT findings reviewed. Will consult for admission.  2:19 AM Case discussed with Dr. Hal Hope who will admit.   Final Clinical Impressions(s) / ED Diagnoses   Final diagnoses:  Epigastric pain  Anemia, unspecified anemia type  Upper GI bleed    New Prescriptions New Prescriptions   No medications on file     Antonietta Breach, PA-C 06/01/16 [place], MD 06/01/16 1144

## 2016-05-31 NOTE — ED Triage Notes (Signed)
Pt presents with c/o abd pain radiating into L flank x 3 days. She reports n/v, dark stools. She denies any urinary changes. Pain is increased with food intake. She has been told she needs hernia repair but has not scheduled the surgery. She is alert and breathing easily.

## 2016-06-01 ENCOUNTER — Encounter (HOSPITAL_COMMUNITY): Payer: Self-pay | Admitting: Radiology

## 2016-06-01 ENCOUNTER — Emergency Department (HOSPITAL_COMMUNITY): Payer: Medicaid Other

## 2016-06-01 DIAGNOSIS — R1013 Epigastric pain: Secondary | ICD-10-CM

## 2016-06-01 DIAGNOSIS — D62 Acute posthemorrhagic anemia: Secondary | ICD-10-CM | POA: Diagnosis not present

## 2016-06-01 DIAGNOSIS — D649 Anemia, unspecified: Secondary | ICD-10-CM

## 2016-06-01 DIAGNOSIS — R109 Unspecified abdominal pain: Secondary | ICD-10-CM | POA: Diagnosis present

## 2016-06-01 DIAGNOSIS — K922 Gastrointestinal hemorrhage, unspecified: Secondary | ICD-10-CM | POA: Diagnosis not present

## 2016-06-01 DIAGNOSIS — K279 Peptic ulcer, site unspecified, unspecified as acute or chronic, without hemorrhage or perforation: Secondary | ICD-10-CM | POA: Diagnosis present

## 2016-06-01 DIAGNOSIS — J449 Chronic obstructive pulmonary disease, unspecified: Secondary | ICD-10-CM

## 2016-06-01 LAB — HEPATIC FUNCTION PANEL
ALK PHOS: 52 U/L (ref 38–126)
ALT: 12 U/L — AB (ref 14–54)
AST: 17 U/L (ref 15–41)
Albumin: 2.9 g/dL — ABNORMAL LOW (ref 3.5–5.0)
Total Bilirubin: 0.3 mg/dL (ref 0.3–1.2)
Total Protein: 5 g/dL — ABNORMAL LOW (ref 6.5–8.1)

## 2016-06-01 LAB — CBC
HCT: 23.3 % — ABNORMAL LOW (ref 36.0–46.0)
HCT: 28.1 % — ABNORMAL LOW (ref 36.0–46.0)
HEMATOCRIT: 30.8 % — AB (ref 36.0–46.0)
HEMOGLOBIN: 8.2 g/dL — AB (ref 12.0–15.0)
HEMOGLOBIN: 8.8 g/dL — AB (ref 12.0–15.0)
Hemoglobin: 6.6 g/dL — CL (ref 12.0–15.0)
MCH: 20.2 pg — AB (ref 26.0–34.0)
MCH: 20.8 pg — AB (ref 26.0–34.0)
MCH: 21 pg — ABNORMAL LOW (ref 26.0–34.0)
MCHC: 28.3 g/dL — AB (ref 30.0–36.0)
MCHC: 29.2 g/dL — ABNORMAL LOW (ref 30.0–36.0)
MCHC: 28.6 g/dL — AB (ref 30.0–36.0)
MCV: 71.3 fL — AB (ref 78.0–100.0)
MCV: 71.3 fL — AB (ref 78.0–100.0)
MCV: 73.3 fL — AB (ref 78.0–100.0)
PLATELETS: 303 10*3/uL (ref 150–400)
PLATELETS: 303 10*3/uL (ref 150–400)
Platelets: 303 10*3/uL (ref 150–400)
RBC: 3.27 MIL/uL — AB (ref 3.87–5.11)
RBC: 3.94 MIL/uL (ref 3.87–5.11)
RBC: 4.2 MIL/uL (ref 3.87–5.11)
RDW: 22.4 % — ABNORMAL HIGH (ref 11.5–15.5)
RDW: 21.4 % — ABNORMAL HIGH (ref 11.5–15.5)
RDW: 21 % — ABNORMAL HIGH (ref 11.5–15.5)
WBC: 6.1 10*3/uL (ref 4.0–10.5)
WBC: 6.2 10*3/uL (ref 4.0–10.5)
WBC: 6.8 10*3/uL (ref 4.0–10.5)

## 2016-06-01 LAB — BASIC METABOLIC PANEL
Anion gap: 6 (ref 5–15)
BUN: 12 mg/dL (ref 6–20)
CHLORIDE: 113 mmol/L — AB (ref 101–111)
CO2: 19 mmol/L — ABNORMAL LOW (ref 22–32)
CREATININE: 0.93 mg/dL (ref 0.44–1.00)
Calcium: 8.2 mg/dL — ABNORMAL LOW (ref 8.9–10.3)
GFR calc Af Amer: >60 mL/min (ref >60)
GFR calc non Af Amer: >60 mL/min (ref >60)
GLUCOSE: 85 mg/dL (ref 65–99)
POTASSIUM: 3.5 mmol/L (ref 3.5–5.1)
Sodium: 138 mmol/L (ref 135–145)

## 2016-06-01 LAB — ABO/RH: ABO/RH(D): O POS

## 2016-06-01 LAB — GLUCOSE, CAPILLARY
GLUCOSE-CAPILLARY: 92 mg/dL (ref 65–99)
GLUCOSE-CAPILLARY: 154 mg/dL — AB (ref 65–99)
GLUCOSE-CAPILLARY: 107 mg/dL — AB (ref 65–99)
Glucose-Capillary: 89 mg/dL (ref 65–99)
Glucose-Capillary: 151 mg/dL — ABNORMAL HIGH (ref 65–99)

## 2016-06-01 LAB — PREPARE RBC (CROSSMATCH)

## 2016-06-01 LAB — PREGNANCY, URINE: PREG TEST UR: NEGATIVE

## 2016-06-01 MED ORDER — SODIUM CHLORIDE 0.9 % IV SOLN
INTRAVENOUS | Status: AC
  Administered 2016-06-01: 05:00:00 via INTRAVENOUS

## 2016-06-01 MED ORDER — IOPAMIDOL (ISOVUE-300) INJECTION 61%
INTRAVENOUS | Status: AC
  Administered 2016-06-01: 100 mL
  Filled 2016-06-01: qty 100

## 2016-06-01 MED ORDER — MOMETASONE FURO-FORMOTEROL FUM 200-5 MCG/ACT IN AERO
2.0000 | INHALATION_SPRAY | Freq: Two times a day (BID) | RESPIRATORY_TRACT | Status: DC
  Administered 2016-06-01 – 2016-06-04 (×5): 2 via RESPIRATORY_TRACT
  Filled 2016-06-01: qty 8.8

## 2016-06-01 MED ORDER — PANTOPRAZOLE SODIUM 40 MG IV SOLR: 40.0000 mg | Freq: Two times a day (BID) | INTRAVENOUS | Status: DC

## 2016-06-01 MED ORDER — SODIUM CHLORIDE 0.9 % IV SOLN
Freq: Once | INTRAVENOUS | Status: AC
  Administered 2016-06-01: 07:00:00 via INTRAVENOUS

## 2016-06-01 MED ORDER — SODIUM CHLORIDE 0.9 % IV SOLN
80.0000 mg | Freq: Once | INTRAVENOUS | Status: AC
  Administered 2016-06-01: 80 mg via INTRAVENOUS
  Filled 2016-06-01: qty 80

## 2016-06-01 MED ORDER — SODIUM CHLORIDE 0.9 % IV SOLN
8.0000 mg/h | INTRAVENOUS | Status: AC
  Administered 2016-06-01 – 2016-06-04 (×7): 8 mg/h via INTRAVENOUS
  Filled 2016-06-01 (×12): qty 80

## 2016-06-01 MED ORDER — ENSURE ENLIVE PO LIQD
237.0000 mL | Freq: Two times a day (BID) | ORAL | Status: DC
  Administered 2016-06-01 – 2016-06-04 (×5): 237 mL via ORAL

## 2016-06-01 MED ORDER — ADULT MULTIVITAMIN W/MINERALS CH
1.0000 | ORAL_TABLET | Freq: Every day | ORAL | Status: DC
  Administered 2016-06-01 – 2016-06-04 (×4): 1 via ORAL
  Filled 2016-06-01 (×3): qty 1

## 2016-06-01 MED ORDER — OCTREOTIDE LOAD VIA INFUSION
50.0000 ug | Freq: Once | INTRAVENOUS | Status: AC
  Administered 2016-06-01: 50 ug via INTRAVENOUS
  Filled 2016-06-01: qty 25

## 2016-06-01 MED ORDER — ONDANSETRON HCL 4 MG/2ML IJ SOLN
4.0000 mg | Freq: Four times a day (QID) | INTRAMUSCULAR | Status: DC | PRN
  Administered 2016-06-02 – 2016-06-04 (×4): 4 mg via INTRAVENOUS
  Filled 2016-06-01 (×4): qty 2

## 2016-06-01 MED ORDER — ONDANSETRON HCL 4 MG PO TABS: 4.0000 mg | ORAL_TABLET | Freq: Four times a day (QID) | ORAL | Status: DC | PRN

## 2016-06-01 MED ORDER — LEVOFLOXACIN IN D5W 750 MG/150ML IV SOLN
750.0000 mg | Freq: Every day | INTRAVENOUS | Status: DC
  Administered 2016-06-01 – 2016-06-02 (×2): 750 mg via INTRAVENOUS
  Filled 2016-06-01 (×2): qty 150

## 2016-06-01 MED ORDER — HYDROMORPHONE HCL 1 MG/ML IJ SOLN
1.0000 mg | INTRAMUSCULAR | Status: DC | PRN
  Administered 2016-06-01 – 2016-06-04 (×21): 1 mg via INTRAVENOUS
  Filled 2016-06-01 (×21): qty 1

## 2016-06-01 MED ORDER — MORPHINE SULFATE (PF) 2 MG/ML IV SOLN
1.0000 mg | INTRAVENOUS | Status: DC | PRN
  Administered 2016-06-01 (×2): 1 mg via INTRAVENOUS
  Filled 2016-06-01 (×2): qty 1

## 2016-06-01 MED ORDER — SODIUM CHLORIDE 0.9 % IV SOLN
50.0000 ug/h | INTRAVENOUS | Status: DC
  Administered 2016-06-01: 50 ug/h via INTRAVENOUS
  Filled 2016-06-01 (×3): qty 1

## 2016-06-01 MED ORDER — INSULIN ASPART 100 UNIT/ML ~~LOC~~ SOLN
0.0000 [IU] | SUBCUTANEOUS | Status: DC
Start: 2016-06-01 — End: 2016-06-04
  Administered 2016-06-01 (×2): 2 [IU] via SUBCUTANEOUS
  Administered 2016-06-04 (×2): 1 [IU] via SUBCUTANEOUS

## 2016-06-01 NOTE — H&P (Signed)
History and Physical    TORI DATTILIO HGD:[id number] DOB: 07/21/1976 DOA: 05/31/2016  PCP: No PCP Per Patient  Patient coming from: Home.  Chief Complaint: Abdominal pain.  HPI: Shelby Hatfield is a 40 y.o. female with peptic ulcer disease has had EGD in May 2017 which showed erosive gastritis and duodenitis and patient is on PPI presents to the ER because of worsening abdominal pain over the last 3 days with unable to eat anything. Denies any diarrhea. Denies any vomiting. Pain is sharp epigastric in area. Patient also has been taking some NSAIDs last 3 days to relieve her pain. Patient over the last 3 days also noticed some black stools. In the ER patient's hemoglobin is around 7 which is a drop from her recent blood check. Stool for occult blood has been positive. CT scan shows possible gastric and esophageal varices. Note that EGD done in May 2017 did not show any varices. Patient is being admitted for abdominal pain possibly from peptic ulcer disease and acute GI bleed. Patient denies drinking alcohol.  ED Course: Patient was started on Protonix infusion. Type and screen.  Review of Systems: As per HPI, rest all negative.   Past Medical History:  Diagnosis Date  . Adenotonsillar hypertrophy 03/2012   snores during sleep; denies apnea; states occ. wakes up coughing  . Anemia   . Anxiety   . Bipolar 1 disorder ([place])   . 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 Depression   . Diabetes mellitus without complication ([place])   . Hypercholesterolemia   . Migraines   . Obesity   . Pneumonia   . Wears dentures    upper denture    Past Surgical History:  Procedure Laterality Date  . APPENDECTOMY    . BIOPSY  02/14/2016   Procedure: BIOPSY;  Surgeon: Danie Binder, MD;  [facility];  Service: Endoscopy;;  gastric biopsies  . CESAREAN SECTION  08/31/2001   total of  3  . CHOLECYSTECTOMY    . DILATION AND  EVACUATION  04/09/2000  . ESOPHAGOGASTRODUODENOSCOPY (EGD) WITH PROPOFOL N/A 02/14/2016   Procedure: ESOPHAGOGASTRODUODENOSCOPY (EGD) WITH PROPOFOL;  Surgeon: Danie Binder, MD;  [facility];  Service: Endoscopy;  Laterality: N/A;  1315  . open paraesophageal hernia repair with gastroexy  09/2015   Dr. Demetrio Lapping  . TONSILLECTOMY    . TONSILLECTOMY AND ADENOIDECTOMY  04/12/2012   Procedure: TONSILLECTOMY AND ADENOIDECTOMY;  Surgeon: Ascencion Dike, MD;  Location: [facility] [place];  Service: ENT;  Laterality: Bilateral;  . TUBAL LIGATION  08/31/2001     reports that she has been smoking Cigarettes.  She has a 5.94 pack-year smoking history. She has never used smokeless tobacco. She reports that she drinks alcohol. She reports that she does not use drugs.  Allergies  Allergen Reactions  . Penicillins Itching and Rash    Has patient had a PCN reaction causing immediate rash, facial/tongue/throat swelling, SOB or lightheadedness with hypotension: Yes Has patient had a PCN reaction causing severe rash involving mucus membranes or skin necrosis: No Has patient had a PCN reaction that required hospitalization No Has patient had a PCN reaction occurring within the last 10 years: No If all of the above answers are "NO", then may proceed with Cephalosporin use.     Family History  Problem Relation Age of Onset  . Cancer Other   .  Hypertension Mother   . Heart disease Mother   . Cancer Mother   . Cancer Maternal Aunt     not sure primary, in kidney and colon  . Cancer Maternal Uncle     lung cancer   . Stroke Maternal Grandfather     Prior to Admission medications   Medication Sig Start Date End Date Taking? Authorizing Provider  Blood Glucose Monitoring Suppl (BLOOD GLUCOSE METER KIT AND SUPPLIES) Dispense based on patient and insurance preference. Use to check sugar two times a day (fasting in am and at bedtime). (FOR ICD-9 250.00, 250.01). Please provide the glucometer  with cheapest strips Patient not taking: Reported on 02/12/2016 08/21/14   Barton Dubois, MD  budesonide-formoterol [facility]) 160-4.5 MCG/ACT inhaler Inhale 2 puffs into the lungs 2 (two) times daily.    Historical Provider, MD  lidocaine (XYLOCAINE) 2 % solution Use as directed 10 mLs in the mouth or throat every 4 (four) hours as needed (abdominal pain.  No more than 8 doses per day). 02/18/16   Danie Binder, MD  ondansetron (ZOFRAN) 4 MG tablet Take 1 tablet (4 mg total) by mouth every 6 (six) hours as needed for nausea or vomiting. 02/28/16   Mahala Menghini, PA-C  oxyCODONE-acetaminophen (PERCOCET) 5-325 MG tablet Take 1 tablet by mouth every 4 (four) hours as needed for moderate pain. 02/12/16   Mahala Menghini, PA-C  pantoprazole (PROTONIX) 40 MG tablet Take 1 tablet (40 mg total) by mouth 2 (two) times daily. 03/02/16   Mahala Menghini, PA-C  promethazine (PHENERGAN) 25 MG tablet Take 0.5-1 tablets (12.5-25 mg total) by mouth every 6 (six) hours as needed for nausea or vomiting. 02/16/16   Evalee Jefferson, PA-C  sucralfate (CARAFATE) 1 g tablet Take 1 tablet (1 g total) by mouth 4 (four) times daily -  with meals and at bedtime. 5/78/46   Delora Fuel, MD  traMADol (ULTRAM) 50 MG tablet 1-2 po q6h prn pain. NO MORE THAN 8 TABS IN 24 HOURS 02/28/16   Mahala Menghini, PA-C    Physical Exam: Vitals:   06/01/16 0045 06/01/16 0245 06/01/16 0322 06/01/16 0342  BP: (!) 100/54 (!) 91/54 (!) 98/53   Pulse: (!) 56 61 62   Resp:   18   Temp:   97.9 F (36.6 C)   TempSrc:   Oral   SpO2: 97% 97% 98%   Weight:    196 lb 4.8 oz (89 kg)  Height:    '5\' 1"'$  (1.549 m)      Constitutional: Not in distress. Vitals:   06/01/16 0045 06/01/16 0245 06/01/16 0322 06/01/16 0342  BP: (!) 100/54 (!) 91/54 (!) 98/53   Pulse: (!) 56 61 62   Resp:   18   Temp:   97.9 F (36.6 C)   TempSrc:   Oral   SpO2: 97% 97% 98%   Weight:    196 lb 4.8 oz (89 kg)  Height:    '5\' 1"'$  (1.549 m)   Eyes: Anicteric no pallor. ENMT: No  discharge from the ears eyes nose or mouth. Neck: No mass felt. No JVD appreciated. Respiratory: No rhonchi or crepitations. Cardiovascular: S1 and S2 heard. Abdomen: Epigastric tenderness no guarding or rigidity. Musculoskeletal: No edema. Skin: No rash. Neurologic: Alert awake oriented to time place and person. Moves all extremities. Psychiatric: Appears normal.   Labs on Admission: I have personally reviewed following labs and imaging studies  CBC:  Recent Labs Lab 05/31/16 1955  WBC 8.3  HGB 7.7*  HCT 27.9*  MCV 71.5*  PLT 454   Basic Metabolic Panel:  Recent Labs Lab 05/31/16 1955  NA 137  K 3.6  CL 110  CO2 19*  GLUCOSE 87  BUN 12  CREATININE 0.86  CALCIUM 8.8*   GFR: Estimated Creatinine Clearance: 87.4 mL/min (by C-G formula based on SCr of 0.86 mg/dL). Liver Function Tests:  Recent Labs Lab 05/31/16 1955  AST 16  ALT 11*  ALKPHOS 63  BILITOT 0.3  PROT 6.1*  ALBUMIN 3.5   No results for input(s): LIPASE, AMYLASE in the last 168 hours. No results for input(s): AMMONIA in the last 168 hours. Coagulation Profile: No results for input(s): INR, PROTIME in the last 168 hours. Cardiac Enzymes: No results for input(s): CKTOTAL, CKMB, CKMBINDEX, TROPONINI in the last 168 hours. BNP (last 3 results) No results for input(s): PROBNP in the last 8760 hours. HbA1C: No results for input(s): HGBA1C in the last 72 hours. CBG: No results for input(s): GLUCAP in the last 168 hours. Lipid Profile: No results for input(s): CHOL, HDL, LDLCALC, TRIG, CHOLHDL, LDLDIRECT in the last 72 hours. Thyroid Function Tests: No results for input(s): TSH, T4TOTAL, FREET4, T3FREE, THYROIDAB in the last 72 hours. Anemia Panel: No results for input(s): VITAMINB12, FOLATE, FERRITIN, TIBC, IRON, RETICCTPCT in the last 72 hours. Urine analysis:    Component Value Date/Time   COLORURINE YELLOW 05/31/2016 1959   APPEARANCEUR CLEAR 05/31/2016 1959   APPEARANCEUR Clear  01/16/2015 1114   LABSPEC 1.034 (H) 05/31/2016 1959   LABSPEC 1.006 01/16/2015 1114   PHURINE 5.5 05/31/2016 1959   GLUCOSEU NEGATIVE 05/31/2016 1959   GLUCOSEU Negative 01/16/2015 1114   HGBUR TRACE (A) 05/31/2016 1959   BILIRUBINUR NEGATIVE 05/31/2016 1959   BILIRUBINUR Negative 01/16/2015 1114   [place] 05/31/2016 1959   PROTEINUR NEGATIVE 05/31/2016 1959   UROBILINOGEN 0.2 05/13/2015 1948   NITRITE NEGATIVE 05/31/2016 1959   LEUKOCYTESUR NEGATIVE 05/31/2016 1959   LEUKOCYTESUR Negative 01/16/2015 1114   Sepsis Labs: '@LABRCNTIP'$ (procalcitonin:4,lacticidven:4) )No results found for this or any previous visit (from the past 240 hour(s)).   Radiological Exams on Admission: Ct Abdomen Pelvis W Contrast  Result Date: 06/01/2016 CLINICAL DATA:  Acute onset of mid upper abdominal pain and left upper abdominal pain, with nausea and vomiting. Initial encounter. EXAM: CT ABDOMEN AND PELVIS WITH CONTRAST TECHNIQUE: Multidetector CT imaging of the abdomen and pelvis was performed using the standard protocol following bolus administration of intravenous contrast. CONTRAST:  157m ISOVUE-300 IOPAMIDOL (ISOVUE-300) INJECTION 61% COMPARISON:  CT of the abdomen and pelvis from 02/10/2016 FINDINGS: The visualized lung bases are clear. A moderate hiatal hernia is noted. Scattered prominent nodes are seen about the hiatal hernia, with apparent mild gastric and esophageal varices. The liver and spleen are unremarkable in appearance. The patient is status post cholecystectomy, with clips noted at the gallbladder fossa. The pancreas and adrenal glands are unremarkable. The kidneys are unremarkable in appearance. There is no evidence of hydronephrosis. No renal or ureteral stones are seen. No perinephric stranding is appreciated. No free fluid is identified. The small bowel is unremarkable in appearance. The stomach is within normal limits. No acute vascular abnormalities are seen. The patient is status  post appendectomy. The colon is grossly unremarkable in appearance. The bladder is mildly distended and grossly unremarkable. The uterus is unremarkable in appearance. The ovaries are relatively symmetric. No suspicious adnexal masses are seen. No inguinal lymphadenopathy is seen. No acute osseous abnormalities are identified.  IMPRESSION: 1. Moderate hiatal hernia noted. 2. Scattered prominent nodes about the hiatal hernia, with apparent mild gastric and esophageal varices. Endoscopy could be considered for further evaluation, as deemed clinically appropriate. Electronically Signed   By: Garald Balding M.D.   On: 06/01/2016 01:42     Assessment/Plan Principal Problem:   Upper GI bleed Active Problems:   COPD (chronic obstructive pulmonary disease) (HCC)   Diabetes mellitus type 2 in obese (HCC)   Abdominal pain   PUD (peptic ulcer disease)   Acute blood loss anemia   Acute GI bleeding    1. Acute upper GI bleed - patient's EGD done in May 2017 showed peptic ulcer disease which probably is the source of patient's bleeding. CT scan does shows possible varices in the esophagus and stomach. For now I have placed patient on Protonix infusion octreotide and ceftriaxone. Consult gastroenterologist in a.m. Follow CBC. If there is any further fall in hemoglobin then we'll transfuse PRBC. Patient advised not to take NSAIDs. 2. Acute blood loss anemia - see #1. Follow CBC. 3. Diabetes mellitus type 2 - hold metformin. Patient is placed on sliding scale coverage. 4. COPD - presently not wheezing. 5. Tobacco abuse - tobacco cessation counseling requested. 6. History of diastolic CHF per the chart. Patient appears compensated.   DVT prophylaxis: SCDs. Code Status: Full code.  Family Communication: Discussed with patient.  Disposition Plan: Home.  Consults called: None.  Admission status: Observation.    Rise Patience MD [facility] Pager [phone].  If 7PM-7AM, please contact  night-coverage [email or web address] Password [email or web address]  06/01/2016, 4:36 AM

## 2016-06-01 NOTE — ED Notes (Signed)
Attempted report 

## 2016-06-01 NOTE — Progress Notes (Addendum)
Initial Nutrition Assessment  DOCUMENTATION CODES:   Obesity unspecified, Severe malnutrition in context of chronic illness  INTERVENTION:    Ensure Enlive po BID, each supplement provides 350 kcal and 20 grams of protein   Multivitamin with minerals daily  NUTRITION DIAGNOSIS:   Increased nutrient needs related to chronic illness as evidenced by estimated needs  GOAL:   Patient will meet greater than or equal to 90% of their needs  MONITOR:   PO intake, Labs, Weight trends, I & O's  REASON FOR ASSESSMENT:   Malnutrition Screening Tool  ASSESSMENT:   40 y.o.Femalewith peptic ulcer disease has had EGD in May 2017 which showed erosive gastritis and duodenitis and patient is on PPI presents to the ER because of worsening abdominal pain over the last 3 days with unable to eat anything. Denies any diarrhea. Denies any vomiting. Pain is sharp epigastric in area. Patient also has been taking some NSAIDs last 3 days to relieve her pain. Patient over the last 3 days also noticed some black stools. In the ER patient's hemoglobin is around 7 which is a drop from her recent blood check. Stool for occult blood has been positive. CT scan shows possible gastric and esophageal varices. Note that EGD done in May 2017 did not show any varices. Patient is being admitted for abdominal pain possibly from peptic ulcer disease and acute GI bleed. Patient denied drinking alcohol.  States that when she eats she experiences pain in the upper chest/abdomeninal area. Also reports a 52 lb weight loss (22%) since January 2017 >> severe for time frame. She also reveals she was "grazing" PTA eating foods like cereals, cheese crackers and vanilla ice cream for months. GI note reviewed >> possible EGD tomorrow. No muscle or subcutaneous fat depletion noticed.  Diet Order:  Diet full liquid Room service appropriate? Yes; Fluid consistency: Thin Diet NPO time specified  Skin:  Reviewed, no issues  Last  BM:  9/4  Height:   Ht Readings from Last 1 Encounters:  06/01/16 5\' 1"  (1.549 m)    Weight:   Wt Readings from Last 1 Encounters:  06/01/16 196 lb 4.8 oz (89 kg)   Wt Readings from Last 15 Encounters:  06/01/16 196 lb 4.8 oz (89 kg)  05/21/16 162 lb (73.5 kg)  02/15/16 162 lb (73.5 kg)  02/14/16 196 lb (88.9 kg)  02/12/16 196 lb (88.9 kg)  02/09/16 186 lb (84.4 kg)  01/24/16 182 lb (82.6 kg)  12/19/15 193 lb (87.5 kg)  12/15/15 196 lb (88.9 kg)  11/08/15 200 lb (90.7 kg)  09/30/15 210 lb (95.3 kg)  05/13/15 214 lb 6.4 oz (97.3 kg)  05/09/15 217 lb (98.4 kg)  02/07/15 200 lb (90.7 kg)  01/22/15 225 lb 4.8 oz (102.2 kg)    Ideal Body Weight:  47.7 kg  BMI:  Body mass index is 37.09 kg/m.  Estimated Nutritional Needs:   Kcal:  2000-2200  Protein:  100-110 gm  Fluid:  2.0-2.2 L  EDUCATION NEEDS:   No education needs identified at this time  Maureen ChattersKatie Becket Wecker, RD, LDN Pager #: [phone] After-Hours Pager #: [phone]

## 2016-06-01 NOTE — Consult Note (Signed)
Referring Provider: Dr. Isidoro Donning Primary Care Physician:  No PCP Per Patient Primary Gastroenterologist:  Dr. Laureen Abrahams   Reason for Consultation:  GI bleed  HPI: Shelby Hatfield is a 40 y.o. female presented to the hospital with abdominal pain. Patient describes her pain as epigastric radiating to her left upper quadrant and back without any improvement. occatioal nausea but denied any vomiting. Patient with on and off pain since May 2017. Patient underwent laparoscopic paraesophaegal hernia repair in January 2017. Presented in May with GI bleed. EGD - Dr. Darrick Penna in [place] showed gastric ulcer with gastritis( Per H &P). Report not available to review.  patient was found to have a drop in hemoglobin from 8.8 around 1 month ago to 7.7 and than to 6.6. Hgb up to 8.2 with 1 units of PRBC. Current getting second unit.  CT scan was performed for evaluation of abdominal pain which showed normal-appearing liver and spleen but there was possibility of a small gastric and esophageal varices. Patient denied any black tarry stool or bright red blood per rectum. Denied any vomiting of blood. Patient's stools are dark. She has been taking BCs powder 3-4 times per day.  Past Medical History:  Diagnosis Date  . Adenotonsillar hypertrophy 03/2012   snores during sleep; denies apnea; states occ. wakes up coughing  . Anemia   . Anxiety   . Bipolar 1 disorder (HCC)   . 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 Depression   . Diabetes mellitus without complication (HCC)   . Hypercholesterolemia   . Migraines   . Obesity   . Pneumonia   . Wears dentures    upper denture    Past Surgical History:  Procedure Laterality Date  . APPENDECTOMY    . BIOPSY  02/14/2016   Procedure: BIOPSY;  Surgeon: West Bali, MD;  [facility];  Service: Endoscopy;;  gastric biopsies  . CESAREAN SECTION  08/31/2001   total of  3  . CHOLECYSTECTOMY     . DILATION AND EVACUATION  04/09/2000  . ESOPHAGOGASTRODUODENOSCOPY (EGD) WITH PROPOFOL N/A 02/14/2016   Procedure: ESOPHAGOGASTRODUODENOSCOPY (EGD) WITH PROPOFOL;  Surgeon: West Bali, MD;  [facility];  Service: Endoscopy;  Laterality: N/A;  1315  . open paraesophageal hernia repair with gastroexy  09/2015   Dr. Francee Gentile  . TONSILLECTOMY    . TONSILLECTOMY AND ADENOIDECTOMY  04/12/2012   Procedure: TONSILLECTOMY AND ADENOIDECTOMY;  Surgeon: Darletta Moll, MD;  Location: [facility];  Service: ENT;  Laterality: Bilateral;  . TUBAL LIGATION  08/31/2001    Prior to Admission medications   Medication Sig Start Date End Date Taking? Authorizing Provider  budesonide-formoterol (SYMBICORT) 160-4.5 MCG/ACT inhaler Inhale 2 puffs into the lungs 2 (two) times daily.   Yes Historical Provider, MD  metFORMIN (GLUCOPHAGE) 500 MG tablet Take 500 mg by mouth daily as needed (Only takes if blood sugar becomes high).   Yes Historical Provider, MD  ondansetron (ZOFRAN) 4 MG tablet Take 1 tablet (4 mg total) by mouth every 6 (six) hours as needed for nausea or vomiting. 02/28/16  Yes Tiffany Kocher, PA-C  pantoprazole (PROTONIX) 40 MG tablet Take 1 tablet (40 mg total) by mouth 2 (two) times daily. 03/02/16  Yes Tiffany Kocher, PA-C  promethazine (PHENERGAN) 25 MG tablet Take 0.5-1 tablets (12.5-25 mg total) by mouth every 6 (six) hours as needed  for nausea or vomiting. 02/16/16  Yes Burgess AmorJulie Idol, PA-C  sucralfate (CARAFATE) 1 g tablet Take 1 tablet (1 g total) by mouth 4 (four) times daily -  with meals and at bedtime. Patient taking differently: Take 1 g by mouth 4 (four) times daily as needed (Stomach pain).  02/10/16  Yes Dione Boozeavid Glick, MD    Scheduled Meds: . insulin aspart  0-9 Units Subcutaneous Q4H  . levofloxacin (LEVAQUIN) IV  750 mg Intravenous Q0600  . mometasone-formoterol  2 puff Inhalation BID  . [START ON 06/04/2016] pantoprazole  40 mg Intravenous Q12H   Continuous  Infusions: . sodium chloride Stopped (06/01/16 0658)  . octreotide  (SANDOSTATIN)    IV infusion 50 mcg/hr (06/01/16 1030)  . pantoprozole (PROTONIX) infusion 8 mg/hr (06/01/16 0227)   PRN Meds:.HYDROmorphone (DILAUDID) injection, ondansetron **OR** ondansetron (ZOFRAN) IV  Allergies as of 05/31/2016 - Review Complete 05/31/2016  Allergen Reaction Noted  . Penicillins Itching and Rash 07/17/2011    Family History  Problem Relation Age of Onset  . Cancer Other   . Hypertension Mother   . Heart disease Mother   . Cancer Mother   . Cancer Maternal Aunt     not sure primary, in kidney and colon  . Cancer Maternal Uncle     lung cancer   . Stroke Maternal Grandfather     Social History   Social History  . Marital status: Divorced    Spouse name: N/A  . Number of children: 3  . Years of education: N/A   Occupational History  . Not on file.   Social History Main Topics  . Smoking status: Current Some Day Smoker    Packs/day: 0.33    Years: 18.00    Types: Cigarettes  . Smokeless tobacco: Never Used  . Alcohol use 0.0 oz/week     Comment: occasionally, twice a year, holiday  . Drug use: No     Comment: history of cocaine in 2002  . Sexual activity: Yes    Birth control/ protection: Surgical   Other Topics Concern  . Not on file   Social History Narrative  . No narrative on file    Review of Systems: All negative except as stated above in HPI.  Physical Exam: Vital signs: Vitals:   06/01/16 1210 06/01/16 1225  BP: (!) 108/57 (!) 99/51  Pulse: 63 66  Resp: 18 17  Temp: 98.5 F (36.9 C) 98.5 F (36.9 C)   Last BM Date: 06/01/16 General:   Alert,  Well-developed, well-nourished, pleasant and cooperative in NAD Lungs:  Clear throughout to auscultation.   No wheezes, crackles, or rhonchi. No acute distress. Heart:  Regular rate and rhythm; no murmurs, clicks, rubs,  or gallops. Abdomen: Epigastric TTP, soft, ND, BS +  LE : no edema  Rectal:   Deferred  GI:  Lab Results:  Recent Labs  05/31/16 1955 06/01/16 0508 06/01/16 1200  WBC 8.3 6.1 6.2  HGB 7.7* 6.6* 8.2*  HCT 27.9* 23.3* 28.1*  PLT 396 303 303   BMET  Recent Labs  05/31/16 1955 06/01/16 0508  NA 137 138  K 3.6 3.5  CL 110 113*  CO2 19* 19*  GLUCOSE 87 85  BUN 12 12  CREATININE 0.86 0.93  CALCIUM 8.8* 8.2*   LFT  Recent Labs  06/01/16 0508  PROT 5.0*  ALBUMIN 2.9*  AST 17  ALT 12*  ALKPHOS 52  BILITOT 0.3  BILIDIR <0.1*  IBILI NOT CALCULATED   PT/INR  No results for input(s): LABPROT, INR in the last 72 hours.   Studies/Results: Ct Abdomen Pelvis W Contrast  Result Date: 06/01/2016 CLINICAL DATA:  Acute onset of mid upper abdominal pain and left upper abdominal pain, with nausea and vomiting. Initial encounter. EXAM: CT ABDOMEN AND PELVIS WITH CONTRAST TECHNIQUE: Multidetector CT imaging of the abdomen and pelvis was performed using the standard protocol following bolus administration of intravenous contrast. CONTRAST:  ISOVUE-300 IOPAMIDOL (ISOVUE-300) INJECTION 61% COMPARISON:  CT of the abdomen and pelvis from 02/10/2016 FINDINGS: The visualized lung bases are clear. A moderate hiatal hernia is noted. Scattered prominent nodes are seen about the hiatal hernia, with apparent mild gastric and esophageal varices. The liver and spleen are unremarkable in appearance. The patient is status post cholecystectomy, with clips noted at the gallbladder fossa. The pancreas and adrenal glands are unremarkable. The kidneys are unremarkable in appearance. There is no evidence of hydronephrosis. No renal or ureteral stones are seen. No perinephric stranding is appreciated. No free fluid is identified. The small bowel is unremarkable in appearance. The stomach is within normal limits. No acute vascular abnormalities are seen. The patient is status post appendectomy. The colon is grossly unremarkable in appearance. The bladder is mildly distended and grossly  unremarkable. The uterus is unremarkable in appearance. The ovaries are relatively symmetric. No suspicious adnexal masses are seen. No inguinal lymphadenopathy is seen. No acute osseous abnormalities are identified. IMPRESSION: 1. Moderate hiatal hernia noted. 2. Scattered prominent nodes about the hiatal hernia, with apparent mild gastric and esophageal varices. Endoscopy could be considered for further evaluation, as deemed clinically appropriate. Electronically Signed   By: Roanna Raider M.D.   On: 06/01/2016 01:42    Impression/Plan: Anemia with a recent history of ulcer disease. EGD in May 2017 by Dr. Darrick Penna [facility]) showed ?? Gastritis or gastric ulcer. Report not available to review. Patient denied any black tarry stool or bright blood per rectum but complaining of dark stools.  Occult blood positive - H/O recent paraesophageal hernia repair at [facility] 09/2015.  - NSAIDs use. 3-4 BC powder everyday   Recommendations ------------------------- - Although CT scan showed possibility of gastric and small esophageal varices, patient presentation is not consistent with variceal bleeding. Patient with normal appearing liver. Normal platelets. Given significant NSAID use ulcer disease is in differential - Continue PPI for now. Discontinue octreotide. Consider increasing antibiotics as well. - Possible EGD tomorrow - Monitor H&H. - GI will follow   LOS: 0 days   Ardean Simonich  06/01/2016, 1:03 PM  Pager [phone] If no answer or after 5 PM call [phone]

## 2016-06-01 NOTE — Progress Notes (Signed)
Notified Dr.Kakrakandy of pt hgb 6.6. Gave order to transfuse 2 units of blood. Will continue to monitor

## 2016-06-01 NOTE — Progress Notes (Signed)
Patient complaining of abdominal pain 9/10. Spoke with Dr.Kakrakandy who gave a verbal order for 1 mg of morphine IV Q3 PRN. Will continue to monitor

## 2016-06-01 NOTE — Progress Notes (Signed)
[facility]                                                                              Patient Demographics  Shelby Hatfield, is a 40 y.o. female, DOB - 10-Aug-1976, RUE:[id number]  Admit date - 05/31/2016   Admitting Physician Eduard Clos, MD  Outpatient Primary MD for the patient is No PCP Per Patient  Outpatient specialists:   LOS - 0  days    Chief Complaint  Patient presents with  . Abdominal Pain       Brief summary  Shelby Hatfield is a 40 y.o. female with peptic ulcer disease has had EGD in May 2017 which showed erosive gastritis and duodenitis and patient is on PPI presents to the ER because of worsening abdominal pain over the last 3 days with unable to eat anything. Denies any diarrhea. Denies any vomiting. Pain is sharp epigastric in area. Patient also has been taking some NSAIDs last 3 days to relieve her pain. Patient over the last 3 days also noticed some black stools. In the ER patient's hemoglobin is around 7 which is a drop from her recent blood check. Stool for occult blood has been positive. CT scan shows possible gastric and esophageal varices. Note that EGD done in May 2017 did not show any varices. Patient is being admitted for abdominal pain possibly from peptic ulcer disease and acute GI bleed. Patient denied drinking alcohol.    Assessment & Plan    Principal Problem:   Upper GI bleed With a known history of PUD, duodenal ulcer, acute blood loss anemia - Prior EGD in May 2017 (Dr Darrick Penna) in [place] had shown gastric ulcer with gastritis, duodenal ulcer patient admitted to taking NSAIDs recently for last 3 days. - Continue IV PPI, IV octreotide infusion, IV Levaquin (PCN allergy). NPO status - CT abdomen also showed   moderate hiatal hernia with scattered prominent nodes about the hiatal hernia with apparent mild gastric and esophageal varices, not seen on the previous EGD - Transfuse 2 units packed RBCs, will need more, await H/H -  GI consulted, discussed with Dr. Doretha Imus.    Active Problems:   COPD (chronic obstructive pulmonary disease) (HCC) - Currently stable, continue dulera     Diabetes mellitus type 2 in obese (HCC) - Continue sliding scale insulin q 4hrs     PUD (peptic ulcer disease) - Continue PPI  Code Status: full  DVT Prophylaxis:   SCD's Family Communication: Discussed in detail with the patient, all imaging results, lab results explained to the patient   Disposition Plan:   Time Spent in minutes  25 minutes  Procedures:  None   Consultants:   GI   Antimicrobials:   IV levaquin    Medications  Scheduled Meds: . insulin aspart  0-9 Units Subcutaneous Q4H  . levofloxacin (LEVAQUIN) IV  750 mg Intravenous Q0600  . mometasone-formoterol  2 puff Inhalation BID  . octreotide  50 mcg Intravenous Once  . [START ON 06/04/2016] pantoprazole  40 mg Intravenous Q12H   Continuous Infusions: . sodium chloride Stopped (06/01/16 0658)  . octreotide  (SANDOSTATIN)  IV infusion    . pantoprozole (PROTONIX) infusion 8 mg/hr (06/01/16 0227)   PRN Meds:.morphine injection, ondansetron **OR** ondansetron (ZOFRAN) IV   Antibiotics   Anti-infectives    Start     Dose/Rate Route Frequency Ordered Stop   06/01/16 0500  levofloxacin (LEVAQUIN) IVPB 750 mg    Comments:  Levaquin 750 mg IV q24h for CrCl > 4050mL/min   750 mg 100 mL/hr over 90 Minutes Intravenous Daily 06/01/16 0436          Subjective:   Shelby Hatfield was seen and examined today.  Complaining of epigastric pain 7/10, no nausea or vomiting. Currently no active bleeding.  Patient denies dizziness, chest pain, shortness of breath, new weakness, numbess, tingling. No acute events overnight.    Objective:   Vitals:   06/01/16 0647 06/01/16 0705 06/01/16 0908 06/01/16 0951  BP: (!) 92/51 (!) 94/51  (!) 94/55  Pulse: 61 (!) 55  65  Resp: 16 17  16   Temp: 98.5 F (36.9 C) 98.5 F (36.9 C)  97.8 F (36.6 C)  TempSrc: Oral    Oral  SpO2: 95% 95% 95% 99%  Weight:      Height:        Intake/Output Summary (Last 24 hours) at 06/01/16 1100 Last data filed at 06/01/16 1042  Gross per 24 hour  Intake              100 ml  Output             1200 ml  Net            -1100 ml     Wt Readings from Last 3 Encounters:  06/01/16 89 kg (196 lb 4.8 oz)  05/21/16 73.5 kg (162 lb)  02/15/16 73.5 kg (162 lb)     Exam  General: Alert and oriented x 3, NAD,   HEENT:  PERRLA, EOMI, pallor, Anicteric Sclera, mucous membranes moist.   Neck:   Cardiovascular: S1 S2 auscultated, no rubs, murmurs or gallops. Regular rate and rhythm.  Respiratory: Clear to auscultation bilaterally, no wheezing, rales or rhonchi  Gastrointestinal: Soft, tender worsening epigastric region, nondistended, + bowel sounds  Ext: no cyanosis clubbing or edema  Neuro: AAOx3, Cr N's II- XII. Strength 5/5 upper and lower extremities bilaterally  Skin: No rashes  Psych: Normal affect and demeanor, alert and oriented x3    Data Reviewed:  I have personally reviewed following labs and imaging studies  Micro Results No results found for this or any previous visit (from the past 240 hour(s)).  Radiology Reports Dg Ankle Complete Right  Result Date: 05/21/2016 CLINICAL DATA:  Fall tonight with right ankle pain. Initial encounter. EXAM: RIGHT ANKLE - COMPLETE 3+ VIEW COMPARISON:  None. FINDINGS: There is no evidence of fracture, dislocation, or joint effusion. Nonspecific subcutaneous calcification in the lower leg. IMPRESSION: Negative. Electronically Signed   By: Marnee SpringJonathon  Watts M.D.   On: 05/21/2016 02:04   Ct Abdomen Pelvis W Contrast  Result Date: 06/01/2016 CLINICAL DATA:  Acute onset of mid upper abdominal pain and left upper abdominal pain, with nausea and vomiting. Initial encounter. EXAM: CT ABDOMEN AND PELVIS WITH CONTRAST TECHNIQUE: Multidetector CT imaging of the abdomen and pelvis was performed using the standard protocol  following bolus administration of intravenous contrast. CONTRAST:  100mL ISOVUE-300 IOPAMIDOL (ISOVUE-300) INJECTION 61% COMPARISON:  CT of the abdomen and pelvis from 02/10/2016 FINDINGS: The visualized lung bases are clear. A moderate hiatal hernia is noted. Scattered prominent  nodes are seen about the hiatal hernia, with apparent mild gastric and esophageal varices. The liver and spleen are unremarkable in appearance. The patient is status post cholecystectomy, with clips noted at the gallbladder fossa. The pancreas and adrenal glands are unremarkable. The kidneys are unremarkable in appearance. There is no evidence of hydronephrosis. No renal or ureteral stones are seen. No perinephric stranding is appreciated. No free fluid is identified. The small bowel is unremarkable in appearance. The stomach is within normal limits. No acute vascular abnormalities are seen. The patient is status post appendectomy. The colon is grossly unremarkable in appearance. The bladder is mildly distended and grossly unremarkable. The uterus is unremarkable in appearance. The ovaries are relatively symmetric. No suspicious adnexal masses are seen. No inguinal lymphadenopathy is seen. No acute osseous abnormalities are identified. IMPRESSION: 1. Moderate hiatal hernia noted. 2. Scattered prominent nodes about the hiatal hernia, with apparent mild gastric and esophageal varices. Endoscopy could be considered for further evaluation, as deemed clinically appropriate. Electronically Signed   By: Roanna Raider M.D.   On: 06/01/2016 01:42   Dg Foot Complete Right  Result Date: 05/21/2016 CLINICAL DATA:  Fall with right ankle pain.  Initial encounter. EXAM: RIGHT FOOT COMPLETE - 3+ VIEW COMPARISON:  None. FINDINGS: There is no evidence of fracture or dislocation. There is no evidence of arthropathy or other focal bone abnormality. Soft tissues are unremarkable. IMPRESSION: Negative. Electronically Signed   By: Marnee Spring M.D.   On:  05/21/2016 02:05    Lab Data:  CBC:  Recent Labs Lab 05/31/16 1955 06/01/16 0508  WBC 8.3 6.1  HGB 7.7* 6.6*  HCT 27.9* 23.3*  MCV 71.5* 71.3*  PLT 396 303   Basic Metabolic Panel:  Recent Labs Lab 05/31/16 1955 06/01/16 0508  NA 137 138  K 3.6 3.5  CL 110 113*  CO2 19* 19*  GLUCOSE 87 85  BUN 12 12  CREATININE 0.86 0.93  CALCIUM 8.8* 8.2*   GFR: Estimated Creatinine Clearance: 80.8 mL/min (by C-G formula based on SCr of 0.93 mg/dL). Liver Function Tests:  Recent Labs Lab 05/31/16 1955 06/01/16 0508  AST 16 17  ALT 11* 12*  ALKPHOS 63 52  BILITOT 0.3 0.3  PROT 6.1* 5.0*  ALBUMIN 3.5 2.9*   No results for input(s): LIPASE, AMYLASE in the last 168 hours. No results for input(s): AMMONIA in the last 168 hours. Coagulation Profile: No results for input(s): INR, PROTIME in the last 168 hours. Cardiac Enzymes: No results for input(s): CKTOTAL, CKMB, CKMBINDEX, TROPONINI in the last 168 hours. BNP (last 3 results) No results for input(s): PROBNP in the last 8760 hours. HbA1C: No results for input(s): HGBA1C in the last 72 hours. CBG:  Recent Labs Lab 06/01/16 0446 06/01/16 0817  GLUCAP 92 89   Lipid Profile: No results for input(s): CHOL, HDL, LDLCALC, TRIG, CHOLHDL, LDLDIRECT in the last 72 hours. Thyroid Function Tests: No results for input(s): TSH, T4TOTAL, FREET4, T3FREE, THYROIDAB in the last 72 hours. Anemia Panel: No results for input(s): VITAMINB12, FOLATE, FERRITIN, TIBC, IRON, RETICCTPCT in the last 72 hours. Urine analysis:    Component Value Date/Time   COLORURINE YELLOW 05/31/2016 1959   APPEARANCEUR CLEAR 05/31/2016 1959   APPEARANCEUR Clear 01/16/2015 1114   LABSPEC 1.034 (H) 05/31/2016 1959   LABSPEC 1.006 01/16/2015 1114   PHURINE 5.5 05/31/2016 1959   GLUCOSEU NEGATIVE 05/31/2016 1959   GLUCOSEU Negative 01/16/2015 1114   HGBUR TRACE (A) 05/31/2016 1959   BILIRUBINUR NEGATIVE 05/31/2016  1959   BILIRUBINUR Negative  01/16/2015 1114   KETONESUR NEGATIVE 05/31/2016 1959   PROTEINUR NEGATIVE 05/31/2016 1959   UROBILINOGEN 0.2 05/13/2015 1948   NITRITE NEGATIVE 05/31/2016 1959   LEUKOCYTESUR NEGATIVE 05/31/2016 1959   LEUKOCYTESUR Negative 01/16/2015 1114     Shelby Hatfield M.D. [facility] 06/01/2016, 11:00 AM  Pager: [phone] Between 7am to 7pm - call Pager - [phone]  After 7pm go to [email or web address] - password [email or web address]  Call night coverage person covering after 7pm

## 2016-06-02 ENCOUNTER — Observation Stay (HOSPITAL_COMMUNITY): Payer: Medicaid Other | Admitting: Anesthesiology

## 2016-06-02 ENCOUNTER — Encounter (HOSPITAL_COMMUNITY): Payer: Self-pay

## 2016-06-02 ENCOUNTER — Encounter (HOSPITAL_COMMUNITY)
Admission: EM | Disposition: A | Discharge status: Home / Self Care | Payer: Self-pay | Source: Home / Self Care | Attending: Internal Medicine

## 2016-06-02 DIAGNOSIS — E785 Hyperlipidemia, unspecified: Secondary | ICD-10-CM | POA: Diagnosis present

## 2016-06-02 DIAGNOSIS — K2960 Other gastritis without bleeding: Secondary | ICD-10-CM | POA: Diagnosis present

## 2016-06-02 DIAGNOSIS — Z6837 Body mass index (BMI) 37.0-37.9, adult: Secondary | ICD-10-CM | POA: Diagnosis not present

## 2016-06-02 DIAGNOSIS — E669 Obesity, unspecified: Secondary | ICD-10-CM

## 2016-06-02 DIAGNOSIS — R319 Hematuria, unspecified: Secondary | ICD-10-CM | POA: Diagnosis present

## 2016-06-02 DIAGNOSIS — D62 Acute posthemorrhagic anemia: Secondary | ICD-10-CM | POA: Diagnosis present

## 2016-06-02 DIAGNOSIS — E43 Unspecified severe protein-calorie malnutrition: Secondary | ICD-10-CM

## 2016-06-02 DIAGNOSIS — Z7951 Long term (current) use of inhaled steroids: Secondary | ICD-10-CM | POA: Diagnosis not present

## 2016-06-02 DIAGNOSIS — Z9049 Acquired absence of other specified parts of digestive tract: Secondary | ICD-10-CM | POA: Diagnosis not present

## 2016-06-02 DIAGNOSIS — K210 Gastro-esophageal reflux disease with esophagitis: Secondary | ICD-10-CM | POA: Diagnosis present

## 2016-06-02 DIAGNOSIS — Z79899 Other long term (current) drug therapy: Secondary | ICD-10-CM | POA: Diagnosis not present

## 2016-06-02 DIAGNOSIS — K449 Diaphragmatic hernia without obstruction or gangrene: Secondary | ICD-10-CM | POA: Diagnosis present

## 2016-06-02 DIAGNOSIS — Z9981 Dependence on supplemental oxygen: Secondary | ICD-10-CM | POA: Diagnosis not present

## 2016-06-02 DIAGNOSIS — K921 Melena: Secondary | ICD-10-CM | POA: Diagnosis present

## 2016-06-02 DIAGNOSIS — Z880 Allergy status to penicillin: Secondary | ICD-10-CM | POA: Diagnosis not present

## 2016-06-02 DIAGNOSIS — J449 Chronic obstructive pulmonary disease, unspecified: Secondary | ICD-10-CM | POA: Diagnosis present

## 2016-06-02 DIAGNOSIS — R1013 Epigastric pain: Secondary | ICD-10-CM | POA: Diagnosis present

## 2016-06-02 DIAGNOSIS — F419 Anxiety disorder, unspecified: Secondary | ICD-10-CM | POA: Diagnosis present

## 2016-06-02 DIAGNOSIS — I5032 Chronic diastolic (congestive) heart failure: Secondary | ICD-10-CM | POA: Diagnosis present

## 2016-06-02 DIAGNOSIS — E119 Type 2 diabetes mellitus without complications: Secondary | ICD-10-CM | POA: Diagnosis present

## 2016-06-02 DIAGNOSIS — F17210 Nicotine dependence, cigarettes, uncomplicated: Secondary | ICD-10-CM | POA: Diagnosis present

## 2016-06-02 DIAGNOSIS — K279 Peptic ulcer, site unspecified, unspecified as acute or chronic, without hemorrhage or perforation: Secondary | ICD-10-CM

## 2016-06-02 DIAGNOSIS — F319 Bipolar disorder, unspecified: Secondary | ICD-10-CM | POA: Diagnosis present

## 2016-06-02 DIAGNOSIS — K922 Gastrointestinal hemorrhage, unspecified: Secondary | ICD-10-CM | POA: Diagnosis not present

## 2016-06-02 DIAGNOSIS — Z7984 Long term (current) use of oral hypoglycemic drugs: Secondary | ICD-10-CM | POA: Diagnosis not present

## 2016-06-02 DIAGNOSIS — K257 Chronic gastric ulcer without hemorrhage or perforation: Secondary | ICD-10-CM | POA: Diagnosis present

## 2016-06-02 HISTORY — PX: ESOPHAGOGASTRODUODENOSCOPY (EGD) WITH PROPOFOL: SHX5813

## 2016-06-02 LAB — TYPE AND SCREEN
ABO/RH(D): O POS
ANTIBODY SCREEN: NEGATIVE
UNIT DIVISION: 0
Unit division: 0

## 2016-06-02 LAB — BASIC METABOLIC PANEL
Anion gap: 6 (ref 5–15)
BUN: 13 mg/dL (ref 6–20)
CALCIUM: 8.7 mg/dL — AB (ref 8.9–10.3)
CO2: 21 mmol/L — AB (ref 22–32)
CREATININE: 0.88 mg/dL (ref 0.44–1.00)
Chloride: 111 mmol/L (ref 101–111)
GFR calc non Af Amer: >60 mL/min (ref >60)
GLUCOSE: 90 mg/dL (ref 65–99)
Potassium: 4 mmol/L (ref 3.5–5.1)
Sodium: 138 mmol/L (ref 135–145)

## 2016-06-02 LAB — URINALYSIS, ROUTINE W REFLEX MICROSCOPIC
Bilirubin Urine: NEGATIVE
GLUCOSE, UA: NEGATIVE mg/dL
Ketones, ur: NEGATIVE mg/dL
Nitrite: NEGATIVE
PH: 5 (ref 5.0–8.0)
Protein, ur: NEGATIVE mg/dL
SPECIFIC GRAVITY, URINE: 1.016 (ref 1.005–1.030)

## 2016-06-02 LAB — URINE MICROSCOPIC-ADD ON

## 2016-06-02 LAB — CBC
HEMATOCRIT: 31.3 % — AB (ref 36.0–46.0)
Hemoglobin: 8.9 g/dL — ABNORMAL LOW (ref 12.0–15.0)
MCH: 20.8 pg — ABNORMAL LOW (ref 26.0–34.0)
MCHC: 28.4 g/dL — AB (ref 30.0–36.0)
MCV: 73.3 fL — ABNORMAL LOW (ref 78.0–100.0)
Platelets: 331 10*3/uL (ref 150–400)
RBC: 4.27 MIL/uL (ref 3.87–5.11)
RDW: 21.1 % — AB (ref 11.5–15.5)
WBC: 7.7 10*3/uL (ref 4.0–10.5)

## 2016-06-02 LAB — GLUCOSE, CAPILLARY
GLUCOSE-CAPILLARY: 94 mg/dL (ref 65–99)
GLUCOSE-CAPILLARY: 110 mg/dL — AB (ref 65–99)
GLUCOSE-CAPILLARY: 91 mg/dL (ref 65–99)
Glucose-Capillary: 78 mg/dL (ref 65–99)
Glucose-Capillary: 87 mg/dL (ref 65–99)
Glucose-Capillary: 92 mg/dL (ref 65–99)

## 2016-06-02 SURGERY — ESOPHAGOGASTRODUODENOSCOPY (EGD) WITH PROPOFOL
Anesthesia: Monitor Anesthesia Care

## 2016-06-02 MED ORDER — PROPOFOL 500 MG/50ML IV EMUL
INTRAVENOUS | Status: DC | PRN
  Administered 2016-06-02: 100 ug/kg/min via INTRAVENOUS

## 2016-06-02 MED ORDER — OXYCODONE-ACETAMINOPHEN 5-325 MG PO TABS
2.0000 | ORAL_TABLET | Freq: Once | ORAL | Status: AC
  Administered 2016-06-02: 2 via ORAL
  Filled 2016-06-02: qty 2

## 2016-06-02 MED ORDER — PROPOFOL 10 MG/ML IV BOLUS
INTRAVENOUS | Status: DC | PRN
  Administered 2016-06-02: 100 mg via INTRAVENOUS

## 2016-06-02 MED ORDER — LEVOFLOXACIN 750 MG PO TABS
750.0000 mg | ORAL_TABLET | Freq: Every day | ORAL | Status: DC
  Administered 2016-06-03: 750 mg via ORAL
  Filled 2016-06-02: qty 1

## 2016-06-02 MED ORDER — LACTATED RINGERS IV SOLN
Freq: Once | INTRAVENOUS | Status: AC
  Administered 2016-06-02: 09:00:00 via INTRAVENOUS

## 2016-06-02 MED ORDER — LACTATED RINGERS IV SOLN
INTRAVENOUS | Status: DC | PRN
  Administered 2016-06-02: 09:00:00 via INTRAVENOUS

## 2016-06-02 MED ORDER — OXYCODONE-ACETAMINOPHEN 5-325 MG PO TABS
1.0000 | ORAL_TABLET | ORAL | Status: DC | PRN
  Administered 2016-06-03 – 2016-06-04 (×10): 1 via ORAL
  Filled 2016-06-02 (×10): qty 1

## 2016-06-02 NOTE — Op Note (Signed)
[facility] Patient Name: Shelby Hatfield Procedure Date : 06/02/2016 MRN: [id number] Attending MD: Barrie Folk , MD Date of Birth: November 12, 1975 CSN: [id number] Age: 40 Admit Type: Inpatient Procedure:                Upper GI endoscopy Indications:              Hematemesis Providers:                Everardo All. Madilyn Fireman, MD, Jacquiline Doe, RN, Beryle Beams,                            Technician, Coralee Rud, CRNA Referring MD:              Medicines:                Propofol per Anesthesia Complications:            No immediate complications. Estimated Blood Loss:     Estimated blood loss: none. Procedure:                Pre-Anesthesia Assessment:                           - Prior to the procedure, a History and Physical                            was performed, and patient medications and                            allergies were reviewed. The patient's tolerance of                            previous anesthesia was also reviewed. The risks                            and benefits of the procedure and the sedation                            options and risks were discussed with the patient.                            All questions were answered, and informed consent                            was obtained. Prior Anticoagulants: The patient has                            taken no previous anticoagulant or antiplatelet                            agents. ASA Grade Assessment: II - A patient with                            mild systemic disease. After reviewing the risks  and benefits, the patient was deemed in                            satisfactory condition to undergo the procedure.                           After obtaining informed consent, the endoscope was                            passed under direct vision. Throughout the                            procedure, the patient's blood pressure, pulse, and                            oxygen saturations were  monitored continuously. The                            [id number]-[id number] ([id number] scope was introduced through the                            mouth, and advanced to the second part of duodenum.                            The upper GI endoscopy was accomplished without                            difficulty. The patient tolerated the procedure                            well. Scope In: Scope Out: Findings:      A large hiatal hernia was present.      LA Grade A (one or more mucosal breaks less than 5 mm, not extending       between tops of 2 mucosal folds) esophagitis with no bleeding was found       35 cm from the incisors.      Diffuse mild inflammation characterized by erythema and granularity was       found in the entire examined stomach.      The examined jejunum was normal. Impression:               - Large hiatal hernia.                           - LA Grade A reflux esophagitis.                           - Gastritis.                           - Normal examined jejunum.                           - No specimens collected. Moderate Sedation:      Moderate (conscious) sedation was. The following parameters were       monitored:  oxygen saturation, heart rate, blood pressure, respiratory       rate, EKG, adequacy of pulmonary ventilation, and response to care. Recommendation:           - Full liquid diet.                           - Continue present medications.                           - Observe patient's clinical course.                           - Use a proton pump inhibitor PO BID. Procedure Code(s):        --- Professional ---                           [phone], Esophagogastroduodenoscopy, flexible,                            transoral; diagnostic, including collection of                            specimen(s) by brushing or washing, when performed                            (separate procedure) Diagnosis Code(s):        --- Professional ---                           K44.9,  Diaphragmatic hernia without obstruction or                            gangrene                           K21.0, Gastro-esophageal reflux disease with                            esophagitis                           K29.70, Gastritis, unspecified, without bleeding                           K92.0, Hematemesis CPT copyright 2016 [employer]. All rights reserved. The codes documented in this report are preliminary and upon coder review may  be revised to meet current compliance requirements. Barrie Folk, MD 06/02/2016 9:34:20 AM This report has been signed electronically. Number of Addenda: 0

## 2016-06-02 NOTE — Anesthesia Postprocedure Evaluation (Signed)
Anesthesia Post Note  Patient: Shelby Hatfield  Procedure(s) Performed: Procedure(s) (LRB): ESOPHAGOGASTRODUODENOSCOPY (EGD) WITH PROPOFOL (N/A)  Patient location during evaluation: PACU Anesthesia Type: MAC Level of consciousness: awake and alert Pain management: pain level controlled Vital Signs Assessment: post-procedure vital signs reviewed and stable Respiratory status: spontaneous breathing, nonlabored ventilation, respiratory function stable and patient connected to nasal cannula oxygen Cardiovascular status: stable and blood pressure returned to baseline Anesthetic complications: no    Last Vitals:  Vitals:   06/02/16 0950 06/02/16 1000  BP: 125/80 123/71  Pulse: (!) 55 (!) 57  Resp: 11 11  Temp:      Last Pain:  Vitals:   06/02/16 1000  TempSrc:   PainSc: 6                  Shakendra Griffeth,W. EDMOND

## 2016-06-02 NOTE — Anesthesia Preprocedure Evaluation (Addendum)
Anesthesia Evaluation  Patient identified by MRN, date of birth, ID band Patient awake    Reviewed: Allergy & Precautions, H&P , NPO status , Patient's Chart, lab work & pertinent test results  Airway Mallampati: II  TM Distance: >3 FB Neck ROM: Full    Dental no notable dental hx. (+) Upper Dentures, Dental Advisory Given   Pulmonary COPD,  COPD inhaler and oxygen dependent, Current Smoker,    Pulmonary exam normal breath sounds clear to auscultation       Cardiovascular negative cardio ROS   Rhythm:Regular Rate:Normal     Neuro/Psych  Headaches, Anxiety Depression Bipolar Disorder    GI/Hepatic negative GI ROS, Neg liver ROS,   Endo/Other  diabetes, Type 2, Oral Hypoglycemic AgentsMorbid obesity  Renal/GU negative Renal ROS  negative genitourinary   Musculoskeletal   Abdominal   Peds  Hematology  (+) anemia ,   Anesthesia Other Findings   Reproductive/Obstetrics negative OB ROS                            Anesthesia Physical Anesthesia Plan  ASA: III  Anesthesia Plan: MAC   Post-op Pain Management:    Induction: Intravenous  Airway Management Planned: Nasal Cannula  Additional Equipment:   Intra-op Plan:   Post-operative Plan:   Informed Consent: I have reviewed the patients History and Physical, chart, labs and discussed the procedure including the risks, benefits and alternatives for the proposed anesthesia with the patient or authorized representative who has indicated his/her understanding and acceptance.   Dental advisory given  Plan Discussed with: CRNA  Anesthesia Plan Comments:         Anesthesia Quick Evaluation

## 2016-06-02 NOTE — Transfer of Care (Signed)
Immediate Anesthesia Transfer of Care Note  Patient: Shelby Hatfield  Procedure(s) Performed: Procedure(s): ESOPHAGOGASTRODUODENOSCOPY (EGD) WITH PROPOFOL (N/A)  Patient Location: PACU and Endoscopy Unit  Anesthesia Type:MAC  Level of Consciousness: awake, alert , oriented and patient cooperative  Airway & Oxygen Therapy: Patient Spontanous Breathing and Patient connected to nasal cannula oxygen  Post-op Assessment: Report given to RN, Post -op Vital signs reviewed and stable and Patient moving all extremities  Post vital signs: Reviewed and stable  Last Vitals:  Vitals:   06/02/16 0853 06/02/16 0936  BP: 120/71 (P) 120/82  Pulse: 65 (P) 72  Resp: 10 (P) 18  Temp: 36.9 C     Last Pain:  Vitals:   06/02/16 0853  TempSrc: Oral  PainSc:       Patients Stated Pain Goal: 2 (06/02/16 0459)  Complications: No apparent anesthesia complications

## 2016-06-02 NOTE — Progress Notes (Signed)
Pt complained about having sudden sharp pain in her upper abdomen. MD notified. Will continue to monitor. Percocet was ordered and administered. Will continue to monitor.

## 2016-06-02 NOTE — Care Management Note (Signed)
Case Management Note  Patient Details  Name: Shelby Hatfield MRN: [id number] Date of Birth: [birth year]  Subjective/Objective:                 Independent patient from home, lives with two daughters who drive her to appointments. Covered with medicaid. [place]CP [place] Katharine LookSteven [place], [facility] [address]. Not active with HH.   Action/Plan:  CM will continue to follow Expected Discharge Date:                  Expected Discharge Plan:  Home/Self Care  In-House Referral:  NA  Discharge planning Services  CM Consult  Post Acute Care Choice:  NA Choice offered to:  NA  DME Arranged:  N/A DME Agency:  NA  HH Arranged:  NA HH Agency:  NA  Status of Service:  Completed, signed off  If discussed at Long Length of Stay Meetings, dates discussed:    Additional Comments:  Lawerance SabalDebbie Zandon Talton, RN 06/02/2016, 12:51 PM

## 2016-06-02 NOTE — Progress Notes (Signed)
[facility]                                                                              Patient Demographics  Shelby Hatfield, is a 40 y.o. female, DOB - 04/07/1976, RUE:[id number]  Admit date - 05/31/2016   Admitting Physician Eduard Clos, MD  Outpatient Primary MD for the patient is No PCP Per Patient  Outpatient specialists:   LOS - 0  days    Chief Complaint  Patient presents with  . Abdominal Pain       Brief summary  Shelby Hatfield is a 40 y.o. female with peptic ulcer disease has had EGD in May 2017 which showed erosive gastritis and duodenitis and patient is on PPI presents to the ER because of worsening abdominal pain over the last 3 days with unable to eat anything. Denies any diarrhea. Denies any vomiting. Pain is sharp epigastric in area. Patient also has been taking some NSAIDs last 3 days to relieve her pain. Patient over the last 3 days also noticed some black stools. In the ER patient's hemoglobin is around 7 which is a drop from her recent blood check. Stool for occult blood has been positive. CT scan shows possible gastric and esophageal varices. Note that EGD done in May 2017 did not show any varices. Patient is being admitted for abdominal pain possibly from peptic ulcer disease and acute GI bleed. Patient denied drinking alcohol.    Assessment & Plan    Principal Problem:   Upper GI bleed With a known history of PUD, duodenal ulcer, acute blood loss anemia - Prior EGD in May 2017 (Dr Darrick Penna) in [place] had shown gastric ulcer with gastritis, duodenal ulcer patient admitted to taking NSAIDs recently for last 3 days. - Patient was placed on IV PPI infusion, IV Levaquin. Octreotide infusion was discontinued by GI.  - CT abdomen showed moderate hiatal hernia with scattered prominent nodes about the hiatal hernia with apparent mild gastric and esophageal varices, not seen on the previous EGD - Transfuse 2 units packed RBCs, H&H stable -  Patient underwent EGD this morning, showed large hiatal hernia with grade a reflux esophageal tinnitus, gastritis - GI recommended full liquid diet, PPI BID and observe  Active Problems:   COPD (chronic obstructive pulmonary disease) (HCC) - Currently stable, continue dulera     Diabetes mellitus type 2 in obese (HCC) - Continue sliding scale insulin q 4hrs     PUD (peptic ulcer disease) - Continue PPI  ? Hematuria Patient reports that she may have had hematuria yesterday, abdominal pain and back pain. CT abdomen did not show any nephrolithiasis or perinephric stranding. UA on admission was negative for any UTI. Will repeat UA and culture. No other complaints of dysuria or increased frequency  Code Status: full  DVT Prophylaxis:   SCD's Family Communication: Discussed in detail with the patient, all imaging results, lab results explained to the patient . Patient's daughter in the room, sleeping  Disposition Plan:   Time Spent in minutes  25 minutes  Procedures:  None   Consultants:   GI   Antimicrobials:   IV  levaquin    Medications  Scheduled Meds: . feeding supplement (ENSURE ENLIVE)  237 mL Oral BID BM  . insulin aspart  0-9 Units Subcutaneous Q4H  . levofloxacin (LEVAQUIN) IV  750 mg Intravenous Q0600  . mometasone-formoterol  2 puff Inhalation BID  . multivitamin with minerals  1 tablet Oral Daily  . [START ON 06/04/2016] pantoprazole  40 mg Intravenous Q12H   Continuous Infusions: . pantoprozole (PROTONIX) infusion 8 mg/hr (06/02/16 0030)   PRN Meds:.HYDROmorphone (DILAUDID) injection, ondansetron **OR** ondansetron (ZOFRAN) IV   Antibiotics   Anti-infectives    Start     Dose/Rate Route Frequency Ordered Stop   06/01/16 0500  levofloxacin (LEVAQUIN) IVPB 750 mg    Comments:  Levaquin 750 mg IV q24h for CrCl > 60mL/min   750 mg 100 mL/hr over 90 Minutes Intravenous Daily 06/01/16 0436          Subjective:   Shelby Hatfield was seen and examined  today. Overall improving, no  abdominal pain nausea or vomiting.  Currently no active bleeding.  Patient denies dizziness, chest pain, shortness of breath, new weakness, numbess, tingling.   Objective:   Vitals:   06/02/16 0853 06/02/16 0936 06/02/16 0950 06/02/16 1000  BP: 120/71 120/82 125/80 123/71  Pulse: 65 72 (!) 55 (!) 57  Resp: 10 18 11 11   Temp: 98.4 F (36.9 C) 98.4 F (36.9 C)    TempSrc: Oral Oral    SpO2: 94% 98% 95% 96%  Weight: 89 kg (196 lb 4.8 oz)     Height: 5\' 1"  (1.549 m)       Intake/Output Summary (Last 24 hours) at 06/02/16 1130 Last data filed at 06/02/16 0930  Gross per 24 hour  Intake          1720.83 ml  Output              300 ml  Net          1420.83 ml     Wt Readings from Last 3 Encounters:  06/02/16 89 kg (196 lb 4.8 oz)  05/21/16 73.5 kg (162 lb)  02/15/16 73.5 kg (162 lb)     Exam  General: Alert and oriented x 3, NAD,   HEENT:  Neck:   Cardiovascular: S1 S2 clear, RRR  Respiratory: CTAB  Gastrointestinal: Soft, mild TTP in epigastric region, nondistended, + bowel sounds  Ext: no cyanosis clubbing or edema  Neuro: no new deficits  Skin: No rashes  Psych: Normal affect and demeanor, alert and oriented x3    Data Reviewed:  I have personally reviewed following labs and imaging studies  Micro Results No results found for this or any previous visit (from the past 240 hour(s)).  Radiology Reports Dg Ankle Complete Right  Result Date: 05/21/2016 CLINICAL DATA:  Fall tonight with right ankle pain. Initial encounter. EXAM: RIGHT ANKLE - COMPLETE 3+ VIEW COMPARISON:  None. FINDINGS: There is no evidence of fracture, dislocation, or joint effusion. Nonspecific subcutaneous calcification in the lower leg. IMPRESSION: Negative. Electronically Signed   By: Marnee Spring M.D.   On: 05/21/2016 02:04   Ct Abdomen Pelvis W Contrast  Result Date: 06/01/2016 CLINICAL DATA:  Acute onset of mid upper abdominal pain and left upper  abdominal pain, with nausea and vomiting. Initial encounter. EXAM: CT ABDOMEN AND PELVIS WITH CONTRAST TECHNIQUE: Multidetector CT imaging of the abdomen and pelvis was performed using the standard protocol following bolus administration of intravenous contrast. CONTRAST:  ISOVUE-300 IOPAMIDOL (ISOVUE-300) INJECTION  61% COMPARISON:  CT of the abdomen and pelvis from 02/10/2016 FINDINGS: The visualized lung bases are clear. A moderate hiatal hernia is noted. Scattered prominent nodes are seen about the hiatal hernia, with apparent mild gastric and esophageal varices. The liver and spleen are unremarkable in appearance. The patient is status post cholecystectomy, with clips noted at the gallbladder fossa. The pancreas and adrenal glands are unremarkable. The kidneys are unremarkable in appearance. There is no evidence of hydronephrosis. No renal or ureteral stones are seen. No perinephric stranding is appreciated. No free fluid is identified. The small bowel is unremarkable in appearance. The stomach is within normal limits. No acute vascular abnormalities are seen. The patient is status post appendectomy. The colon is grossly unremarkable in appearance. The bladder is mildly distended and grossly unremarkable. The uterus is unremarkable in appearance. The ovaries are relatively symmetric. No suspicious adnexal masses are seen. No inguinal lymphadenopathy is seen. No acute osseous abnormalities are identified. IMPRESSION: 1. Moderate hiatal hernia noted. 2. Scattered prominent nodes about the hiatal hernia, with apparent mild gastric and esophageal varices. Endoscopy could be considered for further evaluation, as deemed clinically appropriate. Electronically Signed   By: Roanna Raider M.D.   On: 06/01/2016 01:42   Dg Foot Complete Right  Result Date: 05/21/2016 CLINICAL DATA:  Fall with right ankle pain.  Initial encounter. EXAM: RIGHT FOOT COMPLETE - 3+ VIEW COMPARISON:  None. FINDINGS: There is no  evidence of fracture or dislocation. There is no evidence of arthropathy or other focal bone abnormality. Soft tissues are unremarkable. IMPRESSION: Negative. Electronically Signed   By: Marnee Spring M.D.   On: 05/21/2016 02:05    Lab Data:  CBC:  Recent Labs Lab 05/31/16 1955 06/01/16 0508 06/01/16 1200 06/01/16 2005 06/02/16 0210  WBC 8.3 6.1 6.2 6.8 7.7  HGB 7.7* 6.6* 8.2* 8.8* 8.9*  HCT 27.9* 23.3* 28.1* 30.8* 31.3*  MCV 71.5* 71.3* 71.3* 73.3* 73.3*  PLT 396 303 303 303 331   Basic Metabolic Panel:  Recent Labs Lab 05/31/16 1955 06/01/16 0508 06/02/16 0210  NA 137 138 138  K 3.6 3.5 4.0  CL 110 113* 111  CO2 19* 19* 21*  GLUCOSE 87 85 90  BUN 12 12 13   CREATININE 0.86 0.93 0.88  CALCIUM 8.8* 8.2* 8.7*   GFR: Estimated Creatinine Clearance: 85.4 mL/min (by C-G formula based on SCr of 0.88 mg/dL). Liver Function Tests:  Recent Labs Lab 05/31/16 1955 06/01/16 0508  AST 16 17  ALT 11* 12*  ALKPHOS 63 52  BILITOT 0.3 0.3  PROT 6.1* 5.0*  ALBUMIN 3.5 2.9*   No results for input(s): LIPASE, AMYLASE in the last 168 hours. No results for input(s): AMMONIA in the last 168 hours. Coagulation Profile: No results for input(s): INR, PROTIME in the last 168 hours. Cardiac Enzymes: No results for input(s): CKTOTAL, CKMB, CKMBINDEX, TROPONINI in the last 168 hours. BNP (last 3 results) No results for input(s): PROBNP in the last 8760 hours. HbA1C: No results for input(s): HGBA1C in the last 72 hours. CBG:  Recent Labs Lab 06/01/16 1756 06/01/16 2019 06/02/16 0006 06/02/16 0408 06/02/16 0750  GLUCAP 154* 107* 92 94 78   Lipid Profile: No results for input(s): CHOL, HDL, LDLCALC, TRIG, CHOLHDL, LDLDIRECT in the last 72 hours. Thyroid Function Tests: No results for input(s): TSH, T4TOTAL, FREET4, T3FREE, THYROIDAB in the last 72 hours. Anemia Panel: No results for input(s): VITAMINB12, FOLATE, FERRITIN, TIBC, IRON, RETICCTPCT in the last 72  hours. Urine analysis:  Component Value Date/Time   COLORURINE YELLOW 05/31/2016 1959   APPEARANCEUR CLEAR 05/31/2016 1959   APPEARANCEUR Clear 01/16/2015 1114   LABSPEC 1.034 (H) 05/31/2016 1959   LABSPEC 1.006 01/16/2015 1114   PHURINE 5.5 05/31/2016 1959   GLUCOSEU NEGATIVE 05/31/2016 1959   GLUCOSEU Negative 01/16/2015 1114   HGBUR TRACE (A) 05/31/2016 1959   BILIRUBINUR NEGATIVE 05/31/2016 1959   BILIRUBINUR Negative 01/16/2015 1114   KETONESUR NEGATIVE 05/31/2016 1959   PROTEINUR NEGATIVE 05/31/2016 1959   UROBILINOGEN 0.2 05/13/2015 1948   NITRITE NEGATIVE 05/31/2016 1959   LEUKOCYTESUR NEGATIVE 05/31/2016 1959   LEUKOCYTESUR Negative 01/16/2015 1114     Fitzpatrick Alberico M.D. [facility] 06/02/2016, 11:30 AM  Pager: [phone] Between 7am to 7pm - call Pager - [phone]  After 7pm go to [email or web address] - password [email or web address]  Call night coverage person covering after 7pm

## 2016-06-02 NOTE — Progress Notes (Signed)
Paged Dr. Isidoro Donningai concerning pt request for longer acting pain meds at 14:20. No response yet.

## 2016-06-03 ENCOUNTER — Encounter (HOSPITAL_COMMUNITY): Payer: Self-pay | Admitting: Gastroenterology

## 2016-06-03 LAB — CBC
HEMATOCRIT: 31.9 % — AB (ref 36.0–46.0)
HEMOGLOBIN: 9.2 g/dL — AB (ref 12.0–15.0)
MCH: 21.2 pg — AB (ref 26.0–34.0)
MCHC: 28.8 g/dL — AB (ref 30.0–36.0)
MCV: 73.5 fL — AB (ref 78.0–100.0)
Platelets: 335 10*3/uL (ref 150–400)
RBC: 4.34 MIL/uL (ref 3.87–5.11)
RDW: 21.5 % — ABNORMAL HIGH (ref 11.5–15.5)
WBC: 6.5 10*3/uL (ref 4.0–10.5)

## 2016-06-03 LAB — BASIC METABOLIC PANEL
ANION GAP: 11 (ref 5–15)
BUN: 9 mg/dL (ref 6–20)
CHLORIDE: 103 mmol/L (ref 101–111)
CO2: 23 mmol/L (ref 22–32)
Calcium: 8.5 mg/dL — ABNORMAL LOW (ref 8.9–10.3)
Creatinine, Ser: 1.11 mg/dL — ABNORMAL HIGH (ref 0.44–1.00)
GFR calc Af Amer: >60 mL/min (ref >60)
GFR calc non Af Amer: >60 mL/min (ref >60)
GLUCOSE: 96 mg/dL (ref 65–99)
POTASSIUM: 3.8 mmol/L (ref 3.5–5.1)
Sodium: 137 mmol/L (ref 135–145)

## 2016-06-03 LAB — GLUCOSE, CAPILLARY
GLUCOSE-CAPILLARY: 98 mg/dL (ref 65–99)
GLUCOSE-CAPILLARY: 105 mg/dL — AB (ref 65–99)
GLUCOSE-CAPILLARY: 121 mg/dL — AB (ref 65–99)
Glucose-Capillary: 106 mg/dL — ABNORMAL HIGH (ref 65–99)
Glucose-Capillary: 109 mg/dL — ABNORMAL HIGH (ref 65–99)
Glucose-Capillary: 95 mg/dL (ref 65–99)

## 2016-06-03 MED ORDER — SUCRALFATE 1 GM/10ML PO SUSP
1.0000 g | Freq: Three times a day (TID) | ORAL | Status: DC
  Administered 2016-06-03 – 2016-06-04 (×5): 1 g via ORAL
  Filled 2016-06-03 (×5): qty 10

## 2016-06-03 NOTE — Progress Notes (Signed)
[facility] Progress Note  Subjective: Patient complaining of some epigastric pain radiating around the left costal margin. Tolerating full liquid diet with some nausea  Objective: Vital signs in last 24 hours: Temp:  [98 F (36.7 C)-98.5 F (36.9 C)] 98 F (36.7 C) (09/06 0531) Pulse Rate:  [55-72] 63 (09/06 0531) Resp:  [10-19] 17 (09/06 0531) BP: (92-125)/(52-82) 92/52 (09/06 0531) SpO2:  [94 %-98 %] 96 % (09/06 0531) Weight:  [89 kg (196 lb 4.8 oz)] 89 kg (196 lb 4.8 oz) (09/05 0853) Weight change:    PE: Unchanged  Lab Results: Results for orders placed or performed during the hospital encounter of 05/31/16 (from the past 24 hour(s))  Glucose, capillary     Status: None   Collection Time: 06/02/16 11:31 AM  Result Value Ref Range   Glucose-Capillary 87 65 - 99 mg/dL  Glucose, capillary     Status: Abnormal   Collection Time: 06/02/16  4:42 PM  Result Value Ref Range   Glucose-Capillary 110 (H) 65 - 99 mg/dL  Glucose, capillary     Status: None   Collection Time: 06/02/16  8:05 PM  Result Value Ref Range   Glucose-Capillary 91 65 - 99 mg/dL  Urinalysis, Routine w reflex microscopic (not at [facility])     Status: Abnormal   Collection Time: 06/02/16  8:54 PM  Result Value Ref Range   Color, Urine YELLOW YELLOW   APPearance CLOUDY (A) CLEAR   Specific Gravity, Urine 1.016 1.005 - 1.030   pH 5.0 5.0 - 8.0   Glucose, UA NEGATIVE NEGATIVE mg/dL   Hgb urine dipstick TRACE (A) NEGATIVE   Bilirubin Urine NEGATIVE NEGATIVE   Ketones, ur NEGATIVE NEGATIVE mg/dL   Protein, ur NEGATIVE NEGATIVE mg/dL   Nitrite NEGATIVE NEGATIVE   Leukocytes, UA LARGE (A) NEGATIVE  Urine microscopic-add on     Status: Abnormal   Collection Time: 06/02/16  8:54 PM  Result Value Ref Range   Squamous Epithelial / LPF 0-5 (A) NONE SEEN   WBC, UA TOO NUMEROUS TO COUNT 0 - 5 WBC/hpf   RBC / HPF 0-5 0 - 5 RBC/hpf   Bacteria, UA RARE (A) NONE SEEN   Urine-Other YEAST PRESENT   Glucose,  capillary     Status: None   Collection Time: 06/03/16 12:02 AM  Result Value Ref Range   Glucose-Capillary 95 65 - 99 mg/dL  Glucose, capillary     Status: None   Collection Time: 06/03/16  4:47 AM  Result Value Ref Range   Glucose-Capillary 98 65 - 99 mg/dL  Basic metabolic panel     Status: Abnormal   Collection Time: 06/03/16  5:31 AM  Result Value Ref Range   Sodium 137 135 - 145 mmol/L   Potassium 3.8 3.5 - 5.1 mmol/L   Chloride 103 101 - 111 mmol/L   CO2 23 22 - 32 mmol/L   Glucose, Bld 96 65 - 99 mg/dL   BUN 9 6 - 20 mg/dL   Creatinine, Ser 1.61 (H) 0.44 - 1.00 mg/dL   Calcium 8.5 (L) 8.9 - 10.3 mg/dL   GFR calc non Af Amer >60 >60 mL/min   GFR calc Af Amer >60 >60 mL/min   Anion gap 11 5 - 15  CBC     Status: Abnormal   Collection Time: 06/03/16  5:31 AM  Result Value Ref Range   WBC 6.5 4.0 - 10.5 K/uL   RBC 4.34 3.87 - 5.11 MIL/uL   Hemoglobin 9.2 (L)  12.0 - 15.0 g/dL   HCT 16.131.9 (L) 09.636.0 - 04.546.0 %   MCV 73.5 (L) 78.0 - 100.0 fL   MCH 21.2 (L) 26.0 - 34.0 pg   MCHC 28.8 (L) 30.0 - 36.0 g/dL   RDW 40.921.5 (H) 81.111.5 - 91.415.5 %   Platelets 335 150 - 400 K/uL  Glucose, capillary     Status: Abnormal   Collection Time: 06/03/16  7:58 AM  Result Value Ref Range   Glucose-Capillary 106 (H) 65 - 99 mg/dL    Studies/Results: No results found.    Assessment: Anemia and heme positive stool Persistent moderate to large hiatal hernia despite paraesophageal hernia repair earlier this year  Plan: Continue double dose PPI Advance to bland diet Add Carafate Discharge tomorrow if tolerates diet Need colonoscopy eventually as an outpatient.    Bellany Elbaum C 06/03/2016, 8:26 AM  Pager [phone] If no answer or after 5 PM call [phone]

## 2016-06-03 NOTE — Progress Notes (Signed)
[facility]                                                                              Patient Demographics  Shelby Hatfield, is a 40 y.o. female, DOB - 1976/08/02, ZOX:[id number]  Admit date - 05/31/2016   Admitting Physician Eduard ClosArshad N Kakrakandy, MD  Outpatient Primary MD for the patient is No PCP Per Patient  Outpatient specialists:   LOS - 1  days    Chief Complaint  Patient presents with  . Abdominal Pain       Brief summary  Shelby Hatfield is a 40 y.o. female with peptic ulcer disease has had EGD in May 2017 which showed erosive gastritis and duodenitis and patient is on PPI presents to the ER because of worsening abdominal pain over the last 3 days with unable to eat anything. Denies any diarrhea. Denies any vomiting. Pain is sharp epigastric in area. Patient also has been taking some NSAIDs last 3 days to relieve her pain. Patient over the last 3 days also noticed some black stools. In the ER patient's hemoglobin is around 7 which is a drop from her recent blood check. Stool for occult blood has been positive. CT scan shows possible gastric and esophageal varices. Note that EGD done in May 2017 did not show any varices. Patient is being admitted for abdominal pain possibly from peptic ulcer disease and acute GI bleed. Patient denied drinking alcohol.    Assessment & Plan     Upper GI bleed With a known history of PUD, duodenal ulcer, acute blood loss anemia - Prior EGD in May 2017 (Dr Darrick PennaFields) in [place] had shown gastric ulcer with gastritis, duodenal ulcer patient admitted to taking NSAIDs recently for last 3 days. - Patient was placed on IV PPI infusion and Octreotide; last one was discontinued by GI.  - CT abdomen showed moderate hiatal hernia with scattered prominent nodes about the hiatal hernia with apparent mild gastric and esophageal varices, not seen on the previous EGD. - Transfused 2 units packed RBCs, H&H has remained stable - Patient underwent  EGD on 06/02/16, showed large hiatal hernia with grade a reflux esophageal tinnitus and gastritis - GI recommended PPI BID, carafate and advance diet to blend diet -will observe overnight and if well tolerated pursuit colonoscopy as an outpatient -will follow rec's    COPD (chronic obstructive pulmonary disease) (HCC) - Currently stable, continue dulera -good O2 sat on RA -no wheezing      Diabetes mellitus type 2 in obese (HCC) - Continue sliding scale insulin  -advise to follow low carb diet at discharge    PUD (peptic ulcer disease) - Continue PPI twice a day and also carafate as recommended by GI -diet advanced to blend diet  ? Hematuria -Patient reports that she may have had hematuria day prior to admission, abdominal pain and back pain. CT abdomen did not show any nephrolithiasis or perinephric stranding.  -UA on admission was negative for any UTI.  -no dysuria  Code Status: full  DVT Prophylaxis:   SCD's Family Communication: Discussed in detail with the patient, all imaging results, lab results explained  to the patient . Patient's daughter in the room at bedside.  Disposition Plan:   Time Spent in minutes  25 minutes  Procedures:  None   Consultants:   GI   Antimicrobials:   IV levaquin 9/4>>9/6   Medications  Scheduled Meds: . feeding supplement (ENSURE ENLIVE)  237 mL Oral BID BM  . insulin aspart  0-9 Units Subcutaneous Q4H  . mometasone-formoterol  2 puff Inhalation BID  . multivitamin with minerals  1 tablet Oral Daily  . [START ON 06/04/2016] pantoprazole  40 mg Intravenous Q12H  . sucralfate  1 g Oral TID WC & HS   Continuous Infusions: . pantoprozole (PROTONIX) infusion 8 mg/hr (06/03/16 1403)   PRN Meds:.HYDROmorphone (DILAUDID) injection, ondansetron **OR** ondansetron (ZOFRAN) IV, oxyCODONE-acetaminophen   Antibiotics   Anti-infectives    Start     Dose/Rate Route Frequency Ordered Stop   06/03/16 0600  levofloxacin (LEVAQUIN) tablet 750 mg   Status:  Discontinued     750 mg Oral Daily 06/02/16 1315 06/03/16 0730   06/01/16 0500  levofloxacin (LEVAQUIN) IVPB 750 mg  Status:  Discontinued    Comments:  Levaquin 750 mg IV q24h for CrCl > 28mL/min   750 mg 100 mL/hr over 90 Minutes Intravenous Daily 06/01/16 0436 06/02/16 1315        Subjective:   Shelby Hatfield was seen and examined today. Patient is afebrile and no complaining of CP or SOB. Overall continue improving; even still referred some intermittent episodes of mid epigastric pain and nausea. No further vomiting. Tolerating blend diet.    Objective:   Vitals:   06/02/16 2159 06/03/16 0530 06/03/16 0531 06/03/16 1433  BP: 111/62 (!) 99/55 (!) 92/52 (!) 100/55  Pulse: (!) 57  63 61  Resp: 17  17 15   Temp: 98.5 F (36.9 C)  98 F (36.7 C) 98.1 F (36.7 C)  TempSrc: Oral  Axillary Oral  SpO2: 95%  96% 96%  Weight:      Height:        Intake/Output Summary (Last 24 hours) at 06/03/16 1627 Last data filed at 06/03/16 1512  Gross per 24 hour  Intake           713.33 ml  Output             2590 ml  Net         -1876.67 ml     Wt Readings from Last 3 Encounters:  06/02/16 89 kg (196 lb 4.8 oz)  05/21/16 73.5 kg (162 lb)  02/15/16 73.5 kg (162 lb)     Exam  General: Alert and oriented x 3, afebrile. Denies CP and SOB. Continue complaining of mid epigastric discomfort.  Cardiovascular: S1 S2 clear, RRR  Respiratory: CTAB  Gastrointestinal: Soft, mild TTP in epigastric region, nondistended, + bowel sounds  Ext: no cyanosis clubbing or edema  Neuro: no new deficits  Skin: No rashes  Psych: Normal affect and demeanor, alert and oriented x3    Data Reviewed:  I have personally reviewed following labs and imaging studies  Micro Results No results found for this or any previous visit (from the past 240 hour(s)).  Radiology Reports Dg Ankle Complete Right  Result Date: 05/21/2016 CLINICAL DATA:  Fall tonight with right ankle pain. Initial  encounter. EXAM: RIGHT ANKLE - COMPLETE 3+ VIEW COMPARISON:  None. FINDINGS: There is no evidence of fracture, dislocation, or joint effusion. Nonspecific subcutaneous calcification in the lower leg. IMPRESSION: Negative. Electronically Signed   By:  Marnee Spring M.D.   On: 05/21/2016 02:04   Ct Abdomen Pelvis W Contrast  Result Date: 06/01/2016 CLINICAL DATA:  Acute onset of mid upper abdominal pain and left upper abdominal pain, with nausea and vomiting. Initial encounter. EXAM: CT ABDOMEN AND PELVIS WITH CONTRAST TECHNIQUE: Multidetector CT imaging of the abdomen and pelvis was performed using the standard protocol following bolus administration of intravenous contrast. CONTRAST:  ISOVUE-300 IOPAMIDOL (ISOVUE-300) INJECTION 61% COMPARISON:  CT of the abdomen and pelvis from 02/10/2016 FINDINGS: The visualized lung bases are clear. A moderate hiatal hernia is noted. Scattered prominent nodes are seen about the hiatal hernia, with apparent mild gastric and esophageal varices. The liver and spleen are unremarkable in appearance. The patient is status post cholecystectomy, with clips noted at the gallbladder fossa. The pancreas and adrenal glands are unremarkable. The kidneys are unremarkable in appearance. There is no evidence of hydronephrosis. No renal or ureteral stones are seen. No perinephric stranding is appreciated. No free fluid is identified. The small bowel is unremarkable in appearance. The stomach is within normal limits. No acute vascular abnormalities are seen. The patient is status post appendectomy. The colon is grossly unremarkable in appearance. The bladder is mildly distended and grossly unremarkable. The uterus is unremarkable in appearance. The ovaries are relatively symmetric. No suspicious adnexal masses are seen. No inguinal lymphadenopathy is seen. No acute osseous abnormalities are identified. IMPRESSION: 1. Moderate hiatal hernia noted. 2. Scattered prominent nodes about the  hiatal hernia, with apparent mild gastric and esophageal varices. Endoscopy could be considered for further evaluation, as deemed clinically appropriate. Electronically Signed   By: Roanna Raider M.D.   On: 06/01/2016 01:42   Dg Foot Complete Right  Result Date: 05/21/2016 CLINICAL DATA:  Fall with right ankle pain.  Initial encounter. EXAM: RIGHT FOOT COMPLETE - 3+ VIEW COMPARISON:  None. FINDINGS: There is no evidence of fracture or dislocation. There is no evidence of arthropathy or other focal bone abnormality. Soft tissues are unremarkable. IMPRESSION: Negative. Electronically Signed   By: Marnee Spring M.D.   On: 05/21/2016 02:05    Lab Data:  CBC:  Recent Labs Lab 06/01/16 0508 06/01/16 1200 06/01/16 2005 06/02/16 0210 06/03/16 0531  WBC 6.1 6.2 6.8 7.7 6.5  HGB 6.6* 8.2* 8.8* 8.9* 9.2*  HCT 23.3* 28.1* 30.8* 31.3* 31.9*  MCV 71.3* 71.3* 73.3* 73.3* 73.5*  PLT 303 303 303 331 335   Basic Metabolic Panel:  Recent Labs Lab 05/31/16 1955 06/01/16 0508 06/02/16 0210 06/03/16 0531  NA 137 138 138 137  K 3.6 3.5 4.0 3.8  CL 110 113* 111 103  CO2 19* 19* 21* 23  GLUCOSE 87 85 90 96  BUN 12 12 13 9   CREATININE 0.86 0.93 0.88 1.11*  CALCIUM 8.8* 8.2* 8.7* 8.5*   GFR: Estimated Creatinine Clearance: 67.7 mL/min (by C-G formula based on SCr of 1.11 mg/dL).   Liver Function Tests:  Recent Labs Lab 05/31/16 1955 06/01/16 0508  AST 16 17  ALT 11* 12*  ALKPHOS 63 52  BILITOT 0.3 0.3  PROT 6.1* 5.0*  ALBUMIN 3.5 2.9*   CBG:  Recent Labs Lab 06/03/16 0002 06/03/16 0447 06/03/16 0758 06/03/16 1137 06/03/16 1606  GLUCAP 95 98 106* 109* 105*   Urine analysis:    Component Value Date/Time   COLORURINE YELLOW 06/02/2016 2054   APPEARANCEUR CLOUDY (A) 06/02/2016 2054   APPEARANCEUR Clear 01/16/2015 1114   LABSPEC 1.016 06/02/2016 2054   LABSPEC 1.006 01/16/2015 1114  PHURINE 5.0 06/02/2016 2054   GLUCOSEU NEGATIVE 06/02/2016 2054   GLUCOSEU Negative  01/16/2015 1114   HGBUR TRACE (A) 06/02/2016 2054   BILIRUBINUR NEGATIVE 06/02/2016 2054   BILIRUBINUR Negative 01/16/2015 1114   KETONESUR NEGATIVE 06/02/2016 2054   PROTEINUR NEGATIVE 06/02/2016 2054   UROBILINOGEN 0.2 05/13/2015 1948   NITRITE NEGATIVE 06/02/2016 2054   LEUKOCYTESUR LARGE (A) 06/02/2016 2054   LEUKOCYTESUR Negative 01/16/2015 1114     Vassie Loll M.D. [facility] 06/03/2016, 4:27 PM  Pager: [phone]

## 2016-06-04 DIAGNOSIS — K449 Diaphragmatic hernia without obstruction or gangrene: Secondary | ICD-10-CM

## 2016-06-04 DIAGNOSIS — K2100 Gastro-esophageal reflux disease with esophagitis, without bleeding: Secondary | ICD-10-CM

## 2016-06-04 LAB — BASIC METABOLIC PANEL
ANION GAP: 9 (ref 5–15)
BUN: 13 mg/dL (ref 6–20)
CO2: 21 mmol/L — ABNORMAL LOW (ref 22–32)
Calcium: 9 mg/dL (ref 8.9–10.3)
Chloride: 106 mmol/L (ref 101–111)
Creatinine, Ser: 0.93 mg/dL (ref 0.44–1.00)
Glucose, Bld: 103 mg/dL — ABNORMAL HIGH (ref 65–99)
POTASSIUM: 4.9 mmol/L (ref 3.5–5.1)
SODIUM: 136 mmol/L (ref 135–145)

## 2016-06-04 LAB — URINE CULTURE

## 2016-06-04 LAB — GLUCOSE, CAPILLARY
GLUCOSE-CAPILLARY: 127 mg/dL — AB (ref 65–99)
Glucose-Capillary: 103 mg/dL — ABNORMAL HIGH (ref 65–99)
Glucose-Capillary: 114 mg/dL — ABNORMAL HIGH (ref 65–99)
Glucose-Capillary: 124 mg/dL — ABNORMAL HIGH (ref 65–99)

## 2016-06-04 MED ORDER — PANTOPRAZOLE SODIUM 40 MG PO TBEC: 40.0000 mg | DELAYED_RELEASE_TABLET | Freq: Two times a day (BID) | ORAL | 2 refills | Status: DC

## 2016-06-04 MED ORDER — TRAMADOL HCL 50 MG PO TABS: 50.0000 mg | ORAL_TABLET | Freq: Four times a day (QID) | ORAL | 0 refills | Status: DC | PRN

## 2016-06-04 MED ORDER — POLYSACCHARIDE IRON COMPLEX 150 MG PO CAPS: 150.0000 mg | ORAL_CAPSULE | Freq: Two times a day (BID) | ORAL | 1 refills | Status: DC

## 2016-06-04 MED ORDER — ENSURE ENLIVE PO LIQD: 237.0000 mL | ORAL | Status: DC

## 2016-06-04 MED ORDER — ONDANSETRON 8 MG PO TBDP: 8.0000 mg | ORAL_TABLET | Freq: Three times a day (TID) | ORAL | 0 refills | Status: DC | PRN

## 2016-06-04 MED ORDER — PROMETHAZINE HCL 25 MG PO TABS: 25.0000 mg | ORAL_TABLET | Freq: Three times a day (TID) | ORAL | 0 refills | Status: DC | PRN

## 2016-06-04 NOTE — Progress Notes (Signed)
[facility] Progress Note  Subjective: Still having some sharp right upper quadrant to right flank pain with some association to meals, basically unchanged  Objective: Vital signs in last 24 hours: Temp:  [98.1 F (36.7 C)-98.3 F (36.8 C)] 98.3 F (36.8 C) (09/07 0548) Pulse Rate:  [55-71] 55 (09/07 0548) Resp:  [14-16] 16 (09/07 0548) BP: (100-110)/(54-66) 102/54 (09/07 0548) SpO2:  [94 %-97 %] 94 % (09/07 0548) Weight change:    PE: Unchanged  Lab Results: Results for orders placed or performed during the hospital encounter of 05/31/16 (from the past 24 hour(s))  Glucose, capillary     Status: Abnormal   Collection Time: 06/03/16 11:37 AM  Result Value Ref Range   Glucose-Capillary 109 (H) 65 - 99 mg/dL  Glucose, capillary     Status: Abnormal   Collection Time: 06/03/16  4:06 PM  Result Value Ref Range   Glucose-Capillary 105 (H) 65 - 99 mg/dL  Glucose, capillary     Status: Abnormal   Collection Time: 06/03/16  8:34 PM  Result Value Ref Range   Glucose-Capillary 121 (H) 65 - 99 mg/dL  Glucose, capillary     Status: Abnormal   Collection Time: 06/04/16 12:31 AM  Result Value Ref Range   Glucose-Capillary 114 (H) 65 - 99 mg/dL  Basic metabolic panel     Status: Abnormal   Collection Time: 06/04/16  4:43 AM  Result Value Ref Range   Sodium 136 135 - 145 mmol/L   Potassium 4.9 3.5 - 5.1 mmol/L   Chloride 106 101 - 111 mmol/L   CO2 21 (L) 22 - 32 mmol/L   Glucose, Bld 103 (H) 65 - 99 mg/dL   BUN 13 6 - 20 mg/dL   Creatinine, Ser 4.090.93 0.44 - 1.00 mg/dL   Calcium 9.0 8.9 - 81.110.3 mg/dL   GFR calc non Af Amer >60 >60 mL/min   GFR calc Af Amer >60 >60 mL/min   Anion gap 9 5 - 15  Glucose, capillary     Status: Abnormal   Collection Time: 06/04/16  4:45 AM  Result Value Ref Range   Glucose-Capillary 103 (H) 65 - 99 mg/dL  Glucose, capillary     Status: Abnormal   Collection Time: 06/04/16  8:01 AM  Result Value Ref Range   Glucose-Capillary 127 (H) 65 -  99 mg/dL    Studies/Results: No results found.    Assessment: Right flank right upper quadrant pain unclear whether gastrointestinal or musculoskeletal Gastroesophageal reflux with persistent hiatal hernia Heme-positive stool  Plan: Continue PPI and Carafate. Okay to discharge from GI standpoint Will follow-up with her in the office and will pursue colonoscopy at some point and possible reassessment of her hiatal hernia by the surgeon who did her repair.    Shelby Hatfield C 06/04/2016, 11:26 AM  Pager [phone] If no answer or after 5 PM call [phone]

## 2016-06-04 NOTE — Discharge Summary (Signed)
Physician Discharge Summary  Shelby FusDede S Hatfield [id number] DOB: [birth year] DOA: 05/31/2016  PCP: No PCP Per Patient  Admit date: 05/31/2016 Discharge date: 06/04/2016  Time spent: 35 minutes  Recommendations for Outpatient Follow-up:  1. Repeat BMET to follow up electrolytes and renal function  2. Repeat CBc to follow Hgb trend    Discharge Diagnoses:  Principal Problem:   Upper GI bleed Active Problems:   COPD (chronic obstructive pulmonary disease) (HCC)   Diabetes mellitus type 2 in obese (HCC)   Abdominal pain   PUD (peptic ulcer disease)   Acute blood loss anemia   Acute GI bleeding   Protein-calorie malnutrition, severe   Gastroesophageal reflux disease with esophagitis   Hiatal hernia   Morbid obesity due to excess calories [facility](HCC)   Discharge Condition: stable and improved. Discharge home with instructions to follow up with GI and general surgery as an outpatient.  Diet recommendation: low carbohydrates diet and low calorie   Filed Weights   06/01/16 0342 06/02/16 0853  Weight: 89 kg (196 lb 4.8 oz) 89 kg (196 lb 4.8 oz)    History of present illness:  Shelby S Medfordis a 40 y.o.femalewith peptic ulcer disease has had EGD in May 2017 which showed erosive gastritis and duodenitis and patient is on PPI presents to the ER because of worsening abdominal pain over the last 3 days with unable to eat anything. Denies any diarrhea. Denies any vomiting. Pain is sharp epigastric in area. Patient also has been taking some NSAIDs last 3 days to relieve her pain. Patient over the last 3 days also noticed some black stools. In the ER patient's hemoglobin is around 7 which is a drop from her recent blood check. Stool for occult blood has been positive. CT scan shows possible gastric and esophageal varices. Note that EGD done in May 2017 did not show any varices. Patient is being admitted for abdominal pain possibly from peptic ulcer disease and acute GI bleed. Patient denied drinking  alcohol.   Hospital Course:  Upper GI bleed With a known history of PUD, duodenal ulcer, acute blood loss anemia - Prior EGD in May 2017 (Dr Darrick PennaFields) in [place] had shown gastric ulcer with gastritis, duodenal ulcer patient admitted to taking NSAIDs recently for last 3 days. - Patient was placed on IV PPI infusion and Octreotide; last one was discontinued by GI.  - CT abdomen showed moderate hiatal hernia with scattered prominent nodes about the hiatal hernia with apparent mild gastric and esophageal varices, not seen on the previous EGD. - Transfused 2 units packed RBCs, H&H has remained stable - Patient underwent EGD on 06/02/16, showed large hiatal hernia with grade a reflux esophageal tinnitus and gastritis - GI recommended PPI BID, carafate and advance diet to blend diet -will discharge home with instructions to follow up with GI and general service as an outpatinet -will discharge on niferex BID    COPD (chronic obstructive pulmonary disease) (HCC) -Currently stable, continue home inhaler regimen  -good O2 sat on RA -no wheezing      Diabetes mellitus type 2 in obese (HCC) -Continue home metformin -advise to follow low carb diet at discharge    PUD (peptic ulcer disease) - Continue PPI twice a day and also carafate as recommended by GI -diet advanced to blend diet -outpatient follow up with general surgery service for re-evaluation of hiatal hernia -follow up with GI services as instructed     Hematuria -Patient reports that she may have had hematuria day  prior to admission, abdominal pain and back pain. CT abdomen did not show any nephrolithiasis or perinephric stranding.  -UA on admission was negative for any UTI.  -no dysuria -no antibiotics needed   Obesity  -Body mass index is 37.09 kg/m. -advise to follow low calorie diet and to increase exercise   Procedures:  EGD: - Large hiatal hernia. - LA Grade A reflux esophagitis. - Gastritis. - Normal examined  jejunum. - No specimens collected.  Consultations:  GI  Discharge Exam: Vitals:   06/03/16 2154 06/04/16 0548  BP: 110/66 (!) 102/54  Pulse: 66 (!) 55  Resp: 16 16  Temp: 98.2 F (36.8 C) 98.3 F (36.8 C)    General: Alert and oriented x 3, afebrile. Denies CP and SOB. Continue complaining of mid epigastric discomfort, but improved overall.  Cardiovascular: S1 S2 clear, RRR  Respiratory: CTAB  Gastrointestinal: Soft, mild TTP in epigastric region, nondistended, + bowel sounds  Ext: no cyanosis clubbing or edema  Neuro: no new deficits  Skin: No rashes  Psych: Normal affect and demeanor, alert and oriented x3    Discharge Instructions   Discharge Instructions    Diet - low sodium heart healthy    Complete by:  As directed   Discharge instructions    Complete by:  As directed   Follow low calorie and low carbohydrates diet Keep yourself well hydrated Minimize/avoid use of NSAIDs, caffeine, citric/acidic liquids and carbonated substances Follow up with gastroenterology as instructed Establish care with PCP and follow up as soon as possible   Increase activity slowly    Complete by:  As directed     Current Discharge Medication List    START taking these medications   Details  iron polysaccharides (NIFEREX) 150 MG capsule Take 1 capsule (150 mg total) by mouth 2 (two) times daily. Qty: 60 capsule, Refills: 1    ondansetron (ZOFRAN ODT) 8 MG disintegrating tablet Take 1 tablet (8 mg total) by mouth every 8 (eight) hours as needed for nausea or vomiting. Qty: 30 tablet, Refills: 0    traMADol (ULTRAM) 50 MG tablet Take 1 tablet (50 mg total) by mouth every 6 (six) hours as needed for severe pain. Qty: 30 tablet, Refills: 0      CONTINUE these medications which have CHANGED   Details  pantoprazole (PROTONIX) 40 MG tablet Take 1 tablet (40 mg total) by mouth 2 (two) times daily. Qty: 60 tablet, Refills: 2    promethazine (PHENERGAN) 25 MG tablet Take 1  tablet (25 mg total) by mouth every 8 (eight) hours as needed for refractory nausea / vomiting. Qty: 20 tablet, Refills: 0      CONTINUE these medications which have NOT CHANGED   Details  budesonide-formoterol (SYMBICORT) 160-4.5 MCG/ACT inhaler Inhale 2 puffs into the lungs 2 (two) times daily.    metFORMIN (GLUCOPHAGE) 500 MG tablet Take 500 mg by mouth daily as needed (Only takes if blood sugar becomes high).    sucralfate (CARAFATE) 1 g tablet Take 1 tablet (1 g total) by mouth 4 (four) times daily -  with meals and at bedtime. Qty: 60 tablet, Refills: 0      STOP taking these medications     ondansetron (ZOFRAN) 4 MG tablet        Allergies  Allergen Reactions  . Lidocaine Viscous Other (See Comments)    Created BLISTERS in the mouth  . Penicillins Itching and Rash    Has patient had a PCN reaction causing immediate  rash, facial/tongue/throat swelling, SOB or lightheadedness with hypotension: Yes Has patient had a PCN reaction causing severe rash involving mucus membranes or skin necrosis: No Has patient had a PCN reaction that required hospitalization No Has patient had a PCN reaction occurring within the last 10 years: No If all of the above answers are "NO", then may proceed with Cephalosporin use.    Follow-up Information    Milana Obey, MD. Schedule an appointment as soon as possible for a visit today.   Specialty:  Family Medicine Contact information: [address] [place] [address] [phone]           The results of significant diagnostics from this hospitalization (including imaging, microbiology, ancillary and laboratory) are listed below for reference.    Significant Diagnostic Studies: Dg Ankle Complete Right  Result Date: 05/21/2016 CLINICAL DATA:  Fall tonight with right ankle pain. Initial encounter. EXAM: RIGHT ANKLE - COMPLETE 3+ VIEW COMPARISON:  None. FINDINGS: There is no evidence of fracture, dislocation, or joint effusion.  Nonspecific subcutaneous calcification in the lower leg. IMPRESSION: Negative. Electronically Signed   By: Marnee Spring M.D.   On: 05/21/2016 02:04   Ct Abdomen Pelvis W Contrast  Result Date: 06/01/2016 CLINICAL DATA:  Acute onset of mid upper abdominal pain and left upper abdominal pain, with nausea and vomiting. Initial encounter. EXAM: CT ABDOMEN AND PELVIS WITH CONTRAST TECHNIQUE: Multidetector CT imaging of the abdomen and pelvis was performed using the standard protocol following bolus administration of intravenous contrast. CONTRAST:  ISOVUE-300 IOPAMIDOL (ISOVUE-300) INJECTION 61% COMPARISON:  CT of the abdomen and pelvis from 02/10/2016 FINDINGS: The visualized lung bases are clear. A moderate hiatal hernia is noted. Scattered prominent nodes are seen about the hiatal hernia, with apparent mild gastric and esophageal varices. The liver and spleen are unremarkable in appearance. The patient is status post cholecystectomy, with clips noted at the gallbladder fossa. The pancreas and adrenal glands are unremarkable. The kidneys are unremarkable in appearance. There is no evidence of hydronephrosis. No renal or ureteral stones are seen. No perinephric stranding is appreciated. No free fluid is identified. The small bowel is unremarkable in appearance. The stomach is within normal limits. No acute vascular abnormalities are seen. The patient is status post appendectomy. The colon is grossly unremarkable in appearance. The bladder is mildly distended and grossly unremarkable. The uterus is unremarkable in appearance. The ovaries are relatively symmetric. No suspicious adnexal masses are seen. No inguinal lymphadenopathy is seen. No acute osseous abnormalities are identified. IMPRESSION: 1. Moderate hiatal hernia noted. 2. Scattered prominent nodes about the hiatal hernia, with apparent mild gastric and esophageal varices. Endoscopy could be considered for further evaluation, as deemed clinically  appropriate. Electronically Signed   By: Roanna Raider M.D.   On: 06/01/2016 01:42   Dg Foot Complete Right  Result Date: 05/21/2016 CLINICAL DATA:  Fall with right ankle pain.  Initial encounter. EXAM: RIGHT FOOT COMPLETE - 3+ VIEW COMPARISON:  None. FINDINGS: There is no evidence of fracture or dislocation. There is no evidence of arthropathy or other focal bone abnormality. Soft tissues are unremarkable. IMPRESSION: Negative. Electronically Signed   By: Marnee Spring M.D.   On: 05/21/2016 02:05    Microbiology: Recent Results (from the past 240 hour(s))  Urine culture     Status: Abnormal   Collection Time: 06/02/16  8:55 PM  Result Value Ref Range Status   Specimen Description URINE, CLEAN CATCH  Final   Special Requests NONE  Final  Culture MULTIPLE SPECIES PRESENT, SUGGEST RECOLLECTION (A)  Final   Report Status 06/04/2016 FINAL  Final     Labs: Basic Metabolic Panel:  Recent Labs Lab 05/31/16 1955 06/01/16 0508 06/02/16 0210 06/03/16 0531 06/04/16 0443  NA 137 138 138 137 136  K 3.6 3.5 4.0 3.8 4.9  CL 110 113* 111 103 106  CO2 19* 19* 21* 23 21*  GLUCOSE 87 85 90 96 103*  BUN 12 12 13 9 13   CREATININE 0.86 0.93 0.88 1.11* 0.93  CALCIUM 8.8* 8.2* 8.7* 8.5* 9.0   Liver Function Tests:  Recent Labs Lab 05/31/16 1955 06/01/16 0508  AST 16 17  ALT 11* 12*  ALKPHOS 63 52  BILITOT 0.3 0.3  PROT 6.1* 5.0*  ALBUMIN 3.5 2.9*   CBC:  Recent Labs Lab 06/01/16 0508 06/01/16 1200 06/01/16 2005 06/02/16 0210 06/03/16 0531  WBC 6.1 6.2 6.8 7.7 6.5  HGB 6.6* 8.2* 8.8* 8.9* 9.2*  HCT 23.3* 28.1* 30.8* 31.3* 31.9*  MCV 71.3* 71.3* 73.3* 73.3* 73.5*  PLT 303 303 303 331 335    CBG:  Recent Labs Lab 06/03/16 2034 06/04/16 0031 06/04/16 0445 06/04/16 0801 06/04/16 1219  GLUCAP 121* 114* 103* 127* 124*     Signed:  Vassie Loll MD.  [facility] 06/04/2016, 1:21 PM

## 2016-06-04 NOTE — Progress Notes (Signed)
Nutrition Follow-up  DOCUMENTATION CODES:   Obesity unspecified, Severe malnutrition in context of chronic illness  INTERVENTION:   -Continue MVI daily -Decrease Ensure Enlive po daily, each supplement provides 350 kcal and 20 grams of protein  NUTRITION DIAGNOSIS:   Increased nutrient needs related to chronic illness as evidenced by estimated needs.  Progressing  GOAL:   Patient will meet greater than or equal to 90% of their needs  Progressing  MONITOR:   PO intake, Labs, Weight trends, I & O's  REASON FOR ASSESSMENT:   Malnutrition Screening Tool    ASSESSMENT:   40 y.o.Femalewith peptic ulcer disease has had EGD in May 2017 which showed erosive gastritis and duodenitis and patient is on PPI presents to the ER because of worsening abdominal pain over the last 3 days with unable to eat anything. Denies any diarrhea. Denies any vomiting. Pain is sharp epigastric in area. Patient also has been taking some NSAIDs last 3 days to relieve her pain. Patient over the last 3 days also noticed some black stools. In the ER patient's hemoglobin is around 7 which is a drop from her recent blood check. Stool for occult blood has been positive. CT scan shows possible gastric and esophageal varices. Note that EGD done in May 2017 did not show any varices. Patient is being admitted for abdominal pain possibly from peptic ulcer disease and acute GI bleed. Patient denied drinking alcohol.  Pt speaking on phone at time of visit.   Pt underwent endoscopy on 06/02/16, which revealed large hiatal hernia, reflux esophagitis, and gastritis.   Meal completion has improved; PO: 50-80% per doc flowsheets. Advanced to soft diet on 06/03/16. Pt is also consuming Ensure supplements per MAR.   Per GI notes,likely discharge home soon if pt able to tolerate soft diet. Plan for outpatient colonoscopy.   Labs reviewed: CBGS: 103-127.  Diet Order:  DIET SOFT Room service appropriate? Yes; Fluid consistency:  Thin  Skin:  Reviewed, no issues  Last BM:  06/01/16  Height:   Ht Readings from Last 1 Encounters:  06/02/16 5\' 1"  (1.549 m)    Weight:   Wt Readings from Last 1 Encounters:  06/02/16 196 lb 4.8 oz (89 kg)    Ideal Body Weight:  47.7 kg  BMI:  Body mass index is 37.09 kg/m.  Estimated Nutritional Needs:   Kcal:  2000-2200  Protein:  100-110 gm  Fluid:  2.0-2.2 L  EDUCATION NEEDS:   No education needs identified at this time  Ben Sanz A. Mayford KnifeWilliams, RD, LDN, CDE Pager: [phone] After hours Pager: [phone]

## 2016-06-11 ENCOUNTER — Emergency Department (HOSPITAL_COMMUNITY)
Admission: EM | Admit: 2016-06-11 | Discharge: 2016-06-11 | Disposition: A | Discharge status: Home / Self Care | Payer: Medicaid Other | Attending: Emergency Medicine | Admitting: Emergency Medicine

## 2016-06-11 ENCOUNTER — Encounter (HOSPITAL_COMMUNITY): Payer: Self-pay | Admitting: *Deleted

## 2016-06-11 ENCOUNTER — Emergency Department (HOSPITAL_COMMUNITY): Payer: Medicaid Other

## 2016-06-11 DIAGNOSIS — M79631 Pain in right forearm: Secondary | ICD-10-CM | POA: Diagnosis present

## 2016-06-11 DIAGNOSIS — J449 Chronic obstructive pulmonary disease, unspecified: Secondary | ICD-10-CM | POA: Insufficient documentation

## 2016-06-11 DIAGNOSIS — Y939 Activity, unspecified: Secondary | ICD-10-CM | POA: Insufficient documentation

## 2016-06-11 DIAGNOSIS — Y999 Unspecified external cause status: Secondary | ICD-10-CM | POA: Diagnosis not present

## 2016-06-11 DIAGNOSIS — I5033 Acute on chronic diastolic (congestive) heart failure: Secondary | ICD-10-CM | POA: Insufficient documentation

## 2016-06-11 DIAGNOSIS — M79601 Pain in right arm: Secondary | ICD-10-CM

## 2016-06-11 DIAGNOSIS — Z7984 Long term (current) use of oral hypoglycemic drugs: Secondary | ICD-10-CM | POA: Insufficient documentation

## 2016-06-11 DIAGNOSIS — E119 Type 2 diabetes mellitus without complications: Secondary | ICD-10-CM | POA: Insufficient documentation

## 2016-06-11 DIAGNOSIS — F17210 Nicotine dependence, cigarettes, uncomplicated: Secondary | ICD-10-CM | POA: Diagnosis not present

## 2016-06-11 DIAGNOSIS — Y929 Unspecified place or not applicable: Secondary | ICD-10-CM | POA: Diagnosis not present

## 2016-06-11 MED ORDER — KETOROLAC TROMETHAMINE 60 MG/2ML IM SOLN
60.0000 mg | Freq: Once | INTRAMUSCULAR | Status: AC
  Administered 2016-06-11: 60 mg via INTRAMUSCULAR
  Filled 2016-06-11: qty 2
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
## 2016-06-11 NOTE — Discharge Instructions (Signed)
There were no abnormalities on the x-ray. This indicates a likely soft tissue injury. Use naproxen or ibuprofen for pain. Apply ice to reduce inflammation. Follow up with a primary care provider for repeat assessment should symptoms fail to resolve.

## 2016-06-30 ENCOUNTER — Encounter: Payer: Self-pay | Admitting: Gastroenterology

## 2016-06-30 ENCOUNTER — Telehealth: Payer: Self-pay

## 2016-06-30 ENCOUNTER — Other Ambulatory Visit: Payer: Self-pay

## 2016-06-30 ENCOUNTER — Ambulatory Visit (INDEPENDENT_AMBULATORY_CARE_PROVIDER_SITE_OTHER): Payer: Self-pay | Admitting: Gastroenterology

## 2016-06-30 VITALS — BP 128/80 | HR 101 | Temp 98.5°F | Ht 59.0 in | Wt 189.6 lb

## 2016-06-30 DIAGNOSIS — K2100 Gastro-esophageal reflux disease with esophagitis, without bleeding: Secondary | ICD-10-CM

## 2016-06-30 DIAGNOSIS — D509 Iron deficiency anemia, unspecified: Secondary | ICD-10-CM

## 2016-06-30 DIAGNOSIS — K625 Hemorrhage of anus and rectum: Secondary | ICD-10-CM

## 2016-06-30 DIAGNOSIS — K449 Diaphragmatic hernia without obstruction or gangrene: Secondary | ICD-10-CM

## 2016-06-30 DIAGNOSIS — K529 Noninfective gastroenteritis and colitis, unspecified: Secondary | ICD-10-CM | POA: Insufficient documentation

## 2016-06-30 MED ORDER — PEG 3350-KCL-NA BICARB-NACL 420 G PO SOLR: 4000.0000 mL | ORAL | 0 refills | Status: DC

## 2016-06-30 MED ORDER — TRAMADOL HCL 50 MG PO TABS: 50.0000 mg | ORAL_TABLET | Freq: Four times a day (QID) | ORAL | 0 refills | Status: DC | PRN

## 2016-06-30 NOTE — Progress Notes (Addendum)
REVIEWED-NO ADDITIONAL RECOMMENDATIONS.    Primary Care Physician: No PCP Per Patient  Primary Gastroenterologist:  Jonette Eva, MD   Chief Complaint  Patient presents with  . Nausea    HPI: Shelby Hatfield is a 40 y.o. female here for follow up.   Recent admission in 05/2016 at [facility] for abdominal pain, black stools, heme+.  EGD 06/02/16 by Dr. Dorena Cookey showed large hiatal hernia, LA Grade A reflux esophagitis, gastritis. CT showed moderate hh, scattered prominent noes around the hh with ?mild gastric and esophageal varices.   Patient received 2 units of prbcs during her hospitalization for Hgb of 6.6. She is on oral iron.   At this time patient complains of ongoing postprandial epigastric pain. She has pain with and without meals. She went to ED at [facility] since her hospitalization and they are making arrangements for her to se Dr. Carolynn Sayers again for recurrent hernia.   She complains of red blood in stool, stools are tarry on iron. She states when she belches she tastes blood. Complains of pp lower abd pain. She has pain during bowel movement that are very sharp. Stool used to be very loose but now solid on iron. C/o abd bloating. Eating Ensure for malnutrition per nutritionist. Consuming two daily.   She complains of heavy periods with passage of clots. Also has spotting between cycles.     Current Outpatient Prescriptions  Medication Sig Dispense Refill  . budesonide-formoterol (SYMBICORT) 160-4.5 MCG/ACT inhaler Inhale 2 puffs into the lungs 2 (two) times daily.    . iron polysaccharides (NIFEREX) 150 MG capsule Take 1 capsule (150 mg total) by mouth 2 (two) times daily. 60 capsule 1  . metFORMIN (GLUCOPHAGE) 500 MG tablet Take 500 mg by mouth daily as needed (Only takes if blood sugar becomes high).    . ondansetron (ZOFRAN ODT) 8 MG disintegrating tablet Take 1 tablet (8 mg total) by mouth every 8 (eight) hours as needed for nausea or vomiting. 30 tablet 0  . pantoprazole  (PROTONIX) 40 MG tablet Take 1 tablet (40 mg total) by mouth 2 (two) times daily. 60 tablet 2  . sucralfate (CARAFATE) 1 g tablet Take 1 tablet (1 g total) by mouth 4 (four) times daily -  with meals and at bedtime. (Patient taking differently: Take 1 g by mouth 4 (four) times daily as needed (Stomach pain). ) 60 tablet 0   No current facility-administered medications for this visit.     Allergies as of 06/30/2016 - Review Complete 06/30/2016  Allergen Reaction Noted  . Lidocaine viscous Other (See Comments) 06/01/2016  . Penicillins Itching and Rash 07/17/2011    ROS:  General: Negative for anorexia, weight loss, fever, chills, fatigue, weakness. ENT: Negative for hoarseness, difficulty swallowing , nasal congestion. CV: Negative for chest pain, angina, palpitations, dyspnea on exertion, peripheral edema.  Respiratory: Negative for dyspnea at rest, dyspnea on exertion, cough, sputum, wheezing.  GI: See history of present illness. GU:  Negative for dysuria, hematuria, urinary incontinence, urinary frequency, nocturnal urination.  Endo: Negative for unusual weight change.    Physical Examination:   BP 128/80   Pulse (!) 101   Temp 98.5 F (36.9 C) (Oral)   Ht 4\' 11"  (1.499 m)   Wt 189 lb 9.6 oz (86 kg)   LMP 06/17/2016   BMI 38.29 kg/m   General: Well-nourished, well-developed in no acute distress.  Eyes: No icterus. Mouth: Oropharyngeal mucosa moist and pink , no lesions erythema or exudate. Lungs:  Clear to auscultation bilaterally.  Heart: Regular rate and rhythm, no murmurs rubs or gallops.  Abdomen: Bowel sounds are normal, lower abd tenderness/epig tenderness, nondistended, no hepatosplenomegaly or masses, no abdominal bruits or hernia , no rebound or guarding.   Extremities: No lower extremity edema. No clubbing or deformities. Neuro: Alert and oriented x 4   Skin: Warm and dry, no jaundice.   Psych: Alert and cooperative, normal mood and affect.  Labs:  Lab Results   Component Value Date   CREATININE 0.93 06/04/2016   BUN 13 06/04/2016   NA 136 06/04/2016   K 4.9 06/04/2016   CL 106 06/04/2016   CO2 21 (L) 06/04/2016   Lab Results  Component Value Date   ALT 12 (L) 06/01/2016   AST 17 06/01/2016   ALKPHOS 52 06/01/2016   BILITOT 0.3 06/01/2016   Lab Results  Component Value Date   WBC 6.5 06/03/2016   HGB 9.2 (L) 06/03/2016   HCT 31.9 (L) 06/03/2016   MCV 73.5 (L) 06/03/2016   PLT 335 06/03/2016   Lab Results  Component Value Date   LIPASE 24 02/15/2016   [facility] A blood cell count 8600, hemoglobin 11.5, hematocrit 36.9, MCV 69.7, platelets 305,000, H. pylori antibody negative, potassium 4.1, creatinine 0.72, BUN 11, total bilirubin 0.3, alkaline phosphatase 76, AST 15, ALT 10, albumin 4.2  Imaging Studies: Dg Forearm Right  Result Date: 06/11/2016 CLINICAL DATA:  Pain and injury to distal right ulna. Rule out fracture. EXAM: RIGHT FOREARM - 2 VIEW COMPARISON:  None. FINDINGS: There is no evidence of fracture or other focal bone lesions. Soft tissues are unremarkable. IMPRESSION: Negative. Electronically Signed   By: Bary RichardStan  Maynard M.D.   On: 06/11/2016 11:36   Ct Abdomen Pelvis W Contrast  Result Date: 06/01/2016 CLINICAL DATA:  Acute onset of mid upper abdominal pain and left upper abdominal pain, with nausea and vomiting. Initial encounter. EXAM: CT ABDOMEN AND PELVIS WITH CONTRAST TECHNIQUE: Multidetector CT imaging of the abdomen and pelvis was performed using the standard protocol following bolus administration of intravenous contrast. CONTRAST:  100mL ISOVUE-300 IOPAMIDOL (ISOVUE-300) INJECTION 61% COMPARISON:  CT of the abdomen and pelvis from 02/10/2016 FINDINGS: The visualized lung bases are clear. A moderate hiatal hernia is noted. Scattered prominent nodes are seen about the hiatal hernia, with apparent mild gastric and esophageal varices. The liver and spleen are unremarkable in appearance. The  patient is status post cholecystectomy, with clips noted at the gallbladder fossa. The pancreas and adrenal glands are unremarkable. The kidneys are unremarkable in appearance. There is no evidence of hydronephrosis. No renal or ureteral stones are seen. No perinephric stranding is appreciated. No free fluid is identified. The small bowel is unremarkable in appearance. The stomach is within normal limits. No acute vascular abnormalities are seen. The patient is status post appendectomy. The colon is grossly unremarkable in appearance. The bladder is mildly distended and grossly unremarkable. The uterus is unremarkable in appearance. The ovaries are relatively symmetric. No suspicious adnexal masses are seen. No inguinal lymphadenopathy is seen. No acute osseous abnormalities are identified. IMPRESSION: 1. Moderate hiatal hernia noted. 2. Scattered prominent nodes about the hiatal hernia, with apparent mild gastric and esophageal varices. Endoscopy could be considered for further evaluation, as deemed clinically appropriate. Electronically Signed   By: Roanna RaiderJeffery  Chang M.D.   On: 06/01/2016 01:42

## 2016-06-30 NOTE — Patient Instructions (Signed)
1. Colonoscopy as scheduled. See separate instructions.  2. One time RX for pain medication. Use sparingly.

## 2016-06-30 NOTE — Telephone Encounter (Signed)
Scheduled TCS w/Propofol with [facility] while pt was in office. [facility] had gave instructions for ASAP. Offered pt 07/14/16 first, but she wanted to do 07/21/16 instead.

## 2016-06-30 NOTE — Telephone Encounter (Signed)
Left message with pt's roommate for pt to call office (need to inform of pre-op appt 07/16/16 at 9:00 am).

## 2016-07-02 ENCOUNTER — Emergency Department (HOSPITAL_COMMUNITY)
Admission: EM | Admit: 2016-07-02 | Discharge: 2016-07-02 | Disposition: A | Discharge status: Home / Self Care | Payer: Medicaid Other | Attending: Emergency Medicine | Admitting: Emergency Medicine

## 2016-07-02 ENCOUNTER — Encounter (HOSPITAL_COMMUNITY): Payer: Self-pay

## 2016-07-02 DIAGNOSIS — Z79899 Other long term (current) drug therapy: Secondary | ICD-10-CM | POA: Diagnosis not present

## 2016-07-02 DIAGNOSIS — R110 Nausea: Secondary | ICD-10-CM | POA: Insufficient documentation

## 2016-07-02 DIAGNOSIS — E119 Type 2 diabetes mellitus without complications: Secondary | ICD-10-CM | POA: Diagnosis not present

## 2016-07-02 DIAGNOSIS — J441 Chronic obstructive pulmonary disease with (acute) exacerbation: Secondary | ICD-10-CM | POA: Diagnosis not present

## 2016-07-02 DIAGNOSIS — Z7984 Long term (current) use of oral hypoglycemic drugs: Secondary | ICD-10-CM | POA: Diagnosis not present

## 2016-07-02 DIAGNOSIS — F17210 Nicotine dependence, cigarettes, uncomplicated: Secondary | ICD-10-CM | POA: Diagnosis not present

## 2016-07-02 DIAGNOSIS — R300 Dysuria: Secondary | ICD-10-CM | POA: Diagnosis present

## 2016-07-02 DIAGNOSIS — N760 Acute vaginitis: Secondary | ICD-10-CM | POA: Diagnosis not present

## 2016-07-02 DIAGNOSIS — R51 Headache: Secondary | ICD-10-CM | POA: Insufficient documentation

## 2016-07-02 DIAGNOSIS — B9689 Other specified bacterial agents as the cause of diseases classified elsewhere: Secondary | ICD-10-CM

## 2016-07-02 HISTORY — DX: Gastrointestinal hemorrhage, unspecified: K92.2

## 2016-07-02 LAB — CBG MONITORING, ED: Glucose-Capillary: 95 mg/dL (ref 65–99)

## 2016-07-02 LAB — URINALYSIS, ROUTINE W REFLEX MICROSCOPIC
Bilirubin Urine: NEGATIVE
Glucose, UA: NEGATIVE mg/dL
KETONES UR: NEGATIVE mg/dL
Nitrite: NEGATIVE
PH: 5.5 (ref 5.0–8.0)

## 2016-07-02 LAB — WET PREP, GENITAL
SPERM: NONE SEEN
TRICH WET PREP: NONE SEEN
YEAST WET PREP: NONE SEEN

## 2016-07-02 LAB — URINE MICROSCOPIC-ADD ON

## 2016-07-02 LAB — PREGNANCY, URINE: Preg Test, Ur: NEGATIVE

## 2016-07-02 MED ORDER — ACETAMINOPHEN 325 MG PO TABS
650.0000 mg | ORAL_TABLET | Freq: Once | ORAL | Status: AC
  Administered 2016-07-02: 650 mg via ORAL
  Filled 2016-07-02: qty 2

## 2016-07-02 MED ORDER — ACYCLOVIR 400 MG PO TABS: 400.0000 mg | ORAL_TABLET | Freq: Three times a day (TID) | ORAL | 0 refills | Status: DC

## 2016-07-02 MED ORDER — METRONIDAZOLE 500 MG PO TABS: 500.0000 mg | ORAL_TABLET | Freq: Two times a day (BID) | ORAL | 0 refills | Status: DC

## 2016-07-02 NOTE — ED Provider Notes (Signed)
[facility] DEPT Provider Note   CSN: [id number] Arrival date & time: 07/02/16  1039     History   Chief Complaint Chief Complaint  Patient presents with  . Dysuria    HPI Shelby Hatfield is a 40 y.o. female.  The history is provided by the patient.  Dysuria   This is a new problem. The current episode started more than 2 days ago. The problem has been gradually worsening. The quality of the pain is described as burning. The pain is moderate. There has been no fever. Sexually active: denies sexual activity for past 6 months. Associated symptoms include nausea, discharge, frequency and urgency.   Patient with h/o depression, diabetes presents with dysuria for past week She also reports lower pelvic pain and vaginal "bumps" that are painful She also reports vaginal discharge Denies any recent sexual activity Reports distant h/o chlamydia in the 1990s Past Medical History:  Diagnosis Date  . Adenotonsillar hypertrophy 03/2012   snores during sleep; denies apnea; states occ. wakes up coughing  . Anemia   . Anxiety   . Bipolar 1 disorder (HCC)   . 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 Depression   . Diabetes mellitus without complication (HCC)   . GI bleed   . Hypercholesterolemia   . Migraines   . Obesity   . Pneumonia   . Wears dentures    upper denture    Patient Active Problem List   Diagnosis Date Noted  . IDA (iron deficiency anemia) 06/30/2016  . Rectal bleeding 06/30/2016  . Chronic diarrhea 06/30/2016  . Gastroesophageal reflux disease with esophagitis   . Hiatal hernia   . Morbid obesity due to excess calories (HCC)   . Protein-calorie malnutrition, severe 06/02/2016  . Upper GI bleed 06/01/2016  . Abdominal pain 06/01/2016  . PUD (peptic ulcer disease) 06/01/2016  . Acute blood loss anemia 06/01/2016  . Acute GI bleeding 06/01/2016  . Abdominal pain, epigastric 02/12/2016  . Absolute  anemia 02/12/2016  . Hematemesis 02/12/2016  . Pulmonary nodules 02/12/2016  . Acute on chronic diastolic heart failure (HCC) 01/21/2015  . 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 Nausea with vomiting 01/20/2015  . 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 SOB (shortness of breath) 01/19/2015  . Lower leg edema 01/19/2015  . Diarrhea   . Diabetes mellitus type 2 in obese (HCC)   . COPD (chronic obstructive pulmonary disease) (HCC) 08/18/2014  . COPD exacerbation (HCC) 08/18/2014  . Chest tightness 08/18/2014  . Shortness of breath   . Opiate addiction (HCC) 05/22/2014  . 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 Opiate dependence, continuous (HCC) 04/20/2013  . Benzodiazepine dependence (HCC) 04/20/2013    Past Surgical History:  Procedure Laterality Date  . APPENDECTOMY    . BIOPSY  02/14/2016   Procedure: BIOPSY;  Surgeon: West Bali, MD;  Location: [facility];  Service: Endoscopy;;  gastric biopsies  . CESAREAN SECTION  08/31/2001   total of  3  . CHOLECYSTECTOMY    . DILATION AND EVACUATION  04/09/2000  . ESOPHAGOGASTRODUODENOSCOPY (EGD) WITH PROPOFOL N/A 02/14/2016   Procedure: ESOPHAGOGASTRODUODENOSCOPY (EGD) WITH PROPOFOL;  Surgeon: West Bali, MD;  Location: [facility];  Service: Endoscopy;  Laterality: N/A;  1315  . ESOPHAGOGASTRODUODENOSCOPY (EGD) WITH PROPOFOL N/A 06/02/2016   Procedure: ESOPHAGOGASTRODUODENOSCOPY (EGD) WITH PROPOFOL;  Surgeon: Dorena Cookey, MD;  Location: [facility]  ENDOSCOPY;  Service: Endoscopy;  Laterality: N/A;  . open paraesophageal hernia repair with gastroexy  09/2015   Dr. Francee Gentile  . TONSILLECTOMY    . TONSILLECTOMY AND ADENOIDECTOMY  04/12/2012   Procedure: TONSILLECTOMY AND ADENOIDECTOMY;  Surgeon: Darletta Moll, MD;  Location: [facility];  Service: ENT;  Laterality: Bilateral;  . TUBAL LIGATION  08/31/2001    OB History    Gravida Para Term Preterm AB Living   8 3 3   5 3     SAB TAB Ectopic Multiple Live Births   5               Home Medications    Prior to Admission medications   Medication Sig Start Date End Date Taking? Authorizing Provider  acetaminophen (TYLENOL) 500 MG tablet Take 1,000 mg by mouth every 6 (six) hours as needed for mild pain.   Yes Historical Provider, MD  budesonide-formoterol (SYMBICORT) 160-4.5 MCG/ACT inhaler Inhale 2 puffs into the lungs 2 (two) times daily.   Yes Historical Provider, MD  iron polysaccharides (NIFEREX) 150 MG capsule Take 1 capsule (150 mg total) by mouth 2 (two) times daily. 06/04/16  Yes Vassie Loll, MD  metFORMIN (GLUCOPHAGE) 500 MG tablet Take 500 mg by mouth daily as needed (Only takes if blood sugar becomes high).   Yes Historical Provider, MD  ondansetron (ZOFRAN ODT) 8 MG disintegrating tablet Take 1 tablet (8 mg total) by mouth every 8 (eight) hours as needed for nausea or vomiting. 06/04/16  Yes Vassie Loll, MD  pantoprazole (PROTONIX) 40 MG tablet Take 1 tablet (40 mg total) by mouth 2 (two) times daily. 06/04/16  Yes Vassie Loll, MD  sucralfate (CARAFATE) 1 g tablet Take 1 tablet (1 g total) by mouth 4 (four) times daily -  with meals and at bedtime. Patient taking differently: Take 1 g by mouth 4 (four) times daily as needed (Stomach pain).  02/10/16  Yes Dione Booze, MD  traMADol (ULTRAM) 50 MG tablet Take 1 tablet (50 mg total) by mouth every 6 (six) hours as needed for severe pain. 06/30/16  Yes Tiffany Kocher, PA-C    Family History Family History  Problem Relation Age of Onset  . Cancer Other   . Hypertension Mother   . Heart disease Mother   . Cancer Mother   . Cancer Maternal Aunt     not sure primary, in kidney and colon  . Cancer Maternal Uncle     lung cancer   . Stroke Maternal Grandfather     Social History Social History  Substance Use Topics  . Smoking status: Current Some Day Smoker    Packs/day: 0.33    Years: 18.00    Types: Cigarettes  . Smokeless tobacco: Never Used  .  Alcohol use 0.0 oz/week     Comment: occasionally, twice a year, holiday     Allergies   Lidocaine viscous and Penicillins   Review of Systems Review of Systems  Constitutional: Negative for fever.  Gastrointestinal: Positive for nausea.       Chronic diarrhea   Genitourinary: Positive for dysuria, frequency, genital sores and urgency.  Neurological:       Mild headache   All other systems reviewed and are negative.    Physical Exam Updated Vital Signs BP 136/81 (BP Location: Left Arm)   Pulse 99   Temp 98.9 F (37.2 C) (Oral)   Resp 18   Ht 5\' 1"  (1.549 m)   Wt 85.3  kg   LMP 06/15/2016 (Approximate)   SpO2 100%   BMI 35.52 kg/m   Physical Exam CONSTITUTIONAL: Well developed/well nourished HEAD: Normocephalic/atraumatic EYES: EOMI/PERRL ENMT: Mucous membranes moist NECK: supple no meningeal signs SPINE/BACK:entire spine nontender CV: S1/S2 noted, no murmurs/rubs/gallops noted LUNGS: Lungs are clear to auscultation bilaterally, no apparent distress ABDOMEN: soft, nontender, no rebound or guarding, bowel sounds noted throughout abdomen GU:no cva tenderness, copious whitish/yellow discharge.  Scattered vesicles to vulvar region, no significant erythema, female nurse present for exam NEURO: Pt is awake/alert/appropriate, moves all extremitiesx4.  No facial droop.   EXTREMITIES: pulses normal/equal, full ROM SKIN: warm, color normal PSYCH: no abnormalities of mood noted, alert and oriented to situation   ED Treatments / Results  Labs (all labs ordered are listed, but only abnormal results are displayed) Labs Reviewed  WET PREP, GENITAL - Abnormal; Notable for the following:       Result Value   Clue Cells Wet Prep HPF POC PRESENT (*)    WBC, Wet Prep HPF POC MANY (*)    All other components within normal limits  URINALYSIS, ROUTINE W REFLEX MICROSCOPIC (NOT AT [facility]) - Abnormal; Notable for the following:    Specific Gravity, Urine >1.030 (*)    Hgb urine  dipstick TRACE (*)    Protein, ur TRACE (*)    Leukocytes, UA SMALL (*)    All other components within normal limits  URINE MICROSCOPIC-ADD ON - Abnormal; Notable for the following:    Squamous Epithelial / LPF TOO NUMEROUS TO COUNT (*)    Bacteria, UA MANY (*)    All other components within normal limits  URINE CULTURE  PREGNANCY, URINE  CBG MONITORING, ED  GC/CHLAMYDIA PROBE AMP ([place]) NOT AT [facility]    EKG  EKG Interpretation None       Radiology No results found.  Procedures Procedures (including critical care time)  Medications Ordered in ED Medications  acetaminophen (TYLENOL) tablet 650 mg (not administered)     Initial Impression / Assessment and Plan / ED Course  I have reviewed the triage vital signs and the nursing notes.  Pertinent labs results that were available during my care of the patient were reviewed by me and considered in my medical decision making (see chart for details).  Clinical Course    Urine culture sent but it appears contaminated Due to pain and lesions will treat for herpes with acyclovir She also has significant discharge with evidence of BV Will d/c home   Final Clinical Impressions(s) / ED Diagnoses   Final diagnoses:  Dysuria  BV (bacterial vaginosis)    New Prescriptions New Prescriptions   ACYCLOVIR (ZOVIRAX) 400 MG TABLET    Take 1 tablet (400 mg total) by mouth 3 (three) times daily.   METRONIDAZOLE (FLAGYL) 500 MG TABLET    Take 1 tablet (500 mg total) by mouth 2 (two) times daily. One po bid x 7 days     Zadie Rhineonald Anyely Cunning, MD 07/02/16 1259

## 2016-07-02 NOTE — Telephone Encounter (Signed)
noted 

## 2016-07-02 NOTE — ED Triage Notes (Signed)
Pt reports vaginal irritation and white vaginal discharge x 1 week.  Reports dysuria x 4 days.

## 2016-07-02 NOTE — Assessment & Plan Note (Signed)
Rectal bleeding in setting of chronic diarrhea and IDA. She recently required blood transfusions. Suspect anemia multifactorial with heavy menses and GI blood losses. She has not had colonoscopy therefore have recommended one in near future with deep sedation.  I have discussed the risks, alternatives, benefits with regards to but not limited to the risk of reaction to medication, bleeding, infection, perforation and the patient is agreeable to proceed. Written consent to be obtained.  She may require endometrial ablation or other measures for heavy menses. Suggested she see her gyn to address.

## 2016-07-02 NOTE — Assessment & Plan Note (Signed)
Continue PPI BID, carafate. Antireflux measures. Appears to have significant hernia even thought she had paraesophageal hernia repair earlier this year. Awaiting appointment with Dr. Carolynn SayersWescott. No varices noted on EGD as suggested on recent CT.

## 2016-07-03 LAB — URINE CULTURE: Culture: <10000 — AB

## 2016-07-03 LAB — GC/CHLAMYDIA PROBE AMP (~~LOC~~) NOT AT ARMC
Chlamydia: NEGATIVE
Neisseria Gonorrhea: NEGATIVE

## 2016-07-03 NOTE — Progress Notes (Signed)
NO PCP PER PATIENT °

## 2016-07-15 NOTE — Patient Instructions (Signed)
Shelby Hatfield  07/15/2016     @PREFPERIOPPHARMACY @   Your procedure is scheduled on  07/21/2016   [facility] at  945 A.M.  Call this number if you have problems the morning of surgery:  [phone]   Remember:  Do not eat food or drink liquids after midnight.  Take these medicines the morning of surgery with A SIP OF WATER  Claritin, zofran, protonix, ultram.   Do not wear jewelry, make-up or nail polish.  Do not wear lotions, powders, or perfumes, or deoderant.  Do not shave 48 hours prior to surgery.  Men may shave face and neck.  Do not bring valuables to the hospital.  [facility] is not responsible for any belongings or valuables.  Contacts, dentures or bridgework may not be worn into surgery.  Leave your suitcase in the car.  After surgery it may be brought to your room.  For patients admitted to the hospital, discharge time will be determined by your treatment team.  Patients discharged the day of surgery will not be allowed to drive home.   Name and phone number of your driver:   family Special instructions:  Follow the diet and prep instructions given to you by Dr Evelina DunField's office.  Please read over the following fact sheets that you were given. Anesthesia Post-op Instructions and Care and Recovery After Surgery       Colonoscopy A colonoscopy is an exam to look at the entire large intestine (colon). This exam can help find problems such as tumors, polyps, inflammation, and areas of bleeding. The exam takes about 1 hour.  LET [facility] PROVID[facility] KNOW ABOUT:   Any allergies you have.  All medicines you are taking, including vitamins, herbs, eye drops, creams, and over-the-counter medicines.  Previous problems you or members of your family have had with the use of anesthetics.  Any blood disorders you have.  Previous surgeries you have had.  Medical conditions you have. RISKS AND COMPLICATIONS  Generally, this is a safe  procedure. However, as with any procedure, complications can occur. Possible complications include:  Bleeding.  Tearing or rupture of the colon wall.  Reaction to medicines given during the exam.  Infection (rare). BEFORE THE PROCEDURE   Ask your health care provider about changing or stopping your regular medicines.  You may be prescribed an oral bowel prep. This involves drinking a large amount of medicated liquid, starting the day before your procedure. The liquid will cause you to have multiple loose stools until your stool is almost clear or light green. This cleans out your colon in preparation for the procedure.  Do not eat or drink anything else once you have started the bowel prep, unless your health care provider tells you it is safe to do so.  Arrange for someone to drive you home after the procedure. PROCEDURE   You will be given medicine to help you relax (sedative).  You will lie on your side with your knees bent.  A long, flexible tube with a light and camera on the end (colonoscope) will be inserted through the rectum and into the colon. The camera sends video back to a computer screen as it moves through the colon. The colonoscope also releases carbon dioxide gas to inflate the colon. This helps your health care provider see the area better.  During the exam, your health care provider may take a small tissue sample (biopsy)  to be examined under a microscope if any abnormalities are found.  The exam is finished when the entire colon has been viewed. AFTER THE PROCEDURE   Do not drive for 24 hours after the exam.  You may have a small amount of blood in your stool.  You may pass moderate amounts of gas and have mild abdominal cramping or bloating. This is caused by the gas used to inflate your colon during the exam.  Ask when your test results will be ready and how you will get your results. Make sure you get your test results.   This information is not intended  to replace advice given to you by your health care provider. Make sure you discuss any questions you have with your health care provider.   Document Released: 09/11/2000 Document Revised: 07/05/2013 Document Reviewed: 05/22/2013 Elsevier Interactive Patient Education 2016 [employer] Colonoscopy, Care After Refer to this sheet in the next few weeks. These instructions provide you with information on caring for yourself after your procedure. Your health care provider may also give you more specific instructions. Your treatment has been planned according to current medical practices, but problems sometimes occur. Call your health care provider if you have any problems or questions after your procedure. WHAT TO EXPECT AFTER THE PROCEDURE  After your procedure, it is typical to have the following:  A small amount of blood in your stool.  Moderate amounts of gas and mild abdominal cramping or bloating. HOME CARE INSTRUCTIONS  Do not drive, operate machinery, or sign important documents for 24 hours.  You may shower and resume your regular physical activities, but move at a slower pace for the first 24 hours.  Take frequent rest periods for the first 24 hours.  Walk around or put a warm pack on your abdomen to help reduce abdominal cramping and bloating.  Drink enough fluids to keep your urine clear or pale yellow.  You may resume your normal diet as instructed by your health care provider. Avoid heavy or fried foods that are hard to digest.  Avoid drinking alcohol for 24 hours or as instructed by your health care provider.  Only take over-the-counter or prescription medicines as directed by your health care provider.  If a tissue sample (biopsy) was taken during your procedure:  Do not take aspirin or blood thinners for 7 days, or as instructed by your health care provider.  Do not drink alcohol for 7 days, or as instructed by your health care provider.  Eat soft foods for the first  24 hours. SEEK MEDICAL CARE IF: You have persistent spotting of blood in your stool 2-3 days after the procedure. SEEK IMMEDIATE MEDICAL CARE IF:  You have more than a small spotting of blood in your stool.  You pass large blood clots in your stool.  Your abdomen is swollen (distended).  You have nausea or vomiting.  You have a fever.  You have increasing abdominal pain that is not relieved with medicine.   This information is not intended to replace advice given to you by your health care provider. Make sure you discuss any questions you have with your health care provider.   Document Released: 04/28/2004 Document Revised: 07/05/2013 Document Reviewed: 05/22/2013 Elsevier Interactive Patient Education 2016 [place] Monitored anesthesia care is an anesthesia service for a medical procedure. Anesthesia is the loss of the ability to feel pain. It is produced by medicines called anesthetics. It may affect a small area of  your body (local anesthesia), a large area of your body (regional anesthesia), or your entire body (general anesthesia). The need for monitored anesthesia care depends your procedure, your condition, and the potential need for regional or general anesthesia. It is often provided during procedures where:   General anesthesia may be needed if there are complications. This is because you need special care when you are under general anesthesia.   You will be under local or regional anesthesia. This is so that you are able to have higher levels of anesthesia if needed.   You will receive calming medicines (sedatives). This is especially the case if sedatives are given to put you in a semi-conscious state of relaxation (deep sedation). This is because the amount of sedative needed to produce this state can be hard to predict. Too much of a sedative can produce general anesthesia. Monitored anesthesia care is performed by one or more health care  providers who have special training in all types of anesthesia. You will need to meet with these health care providers before your procedure. During this meeting, they will ask you about your medical history. They will also give you instructions to follow. (For example, you will need to stop eating and drinking before your procedure. You may also need to stop or change medicines you are taking.) During your procedure, your health care providers will stay with you. They will:   Watch your condition. This includes watching your blood pressure, breathing, and level of pain.   Diagnose and treat problems that occur.   Give medicines if they are needed. These may include calming medicines (sedatives) and anesthetics.   Make sure you are comfortable.  Having monitored anesthesia care does not necessarily mean that you will be under anesthesia. It does mean that your health care providers will be able to manage anesthesia if you need it or if it occurs. It also means that you will be able to have a different type of anesthesia than you are having if you need it. When your procedure is complete, your health care providers will continue to watch your condition. They will make sure any medicines wear off before you are allowed to go home.    This information is not intended to replace advice given to you by your health care provider. Make sure you discuss any questions you have with your health care provider.   Document Released: 06/10/2005 Document Revised: 10/05/2014 Document Reviewed: 10/26/2012 Elsevier Interactive Patient Education 2016 Elsevier Inc. PATIENT INSTRUCTIONS POST-ANESTHESIA  IMMEDIATELY FOLLOWING SURGERY:  Do not drive or operate machinery for the first twenty four hours after surgery.  Do not make any important decisions for twenty four hours after surgery or while taking narcotic pain medications or sedatives.  If you develop intractable nausea and vomiting or a severe headache  please notify your doctor immediately.  FOLLOW-UP:  Please make an appointment with your surgeon as instructed. You do not need to follow up with anesthesia unless specifically instructed to do so.  WOUND CARE INSTRUCTIONS (if applicable):  Keep a dry clean dressing on the anesthesia/puncture wound site if there is drainage.  Once the wound has quit draining you may leave it open to air.  Generally you should leave the bandage intact for twenty four hours unless there is drainage.  If the epidural site drains for more than 36-48 hours please call the anesthesia department.  QUESTIONS?:  Please feel free to call your physician or the hospital operator if you have any  questions, and they will be happy to assist you.

## 2016-07-16 ENCOUNTER — Encounter (HOSPITAL_COMMUNITY)
Admission: RE | Admit: 2016-07-16 | Discharge: 2016-07-16 | Disposition: A | Discharge status: Home / Self Care | Payer: Medicaid Other | Source: Ambulatory Visit | Attending: Gastroenterology | Admitting: Gastroenterology

## 2016-07-16 ENCOUNTER — Other Ambulatory Visit: Payer: Self-pay

## 2016-07-16 ENCOUNTER — Encounter (HOSPITAL_COMMUNITY): Payer: Self-pay

## 2016-07-16 DIAGNOSIS — Z01810 Encounter for preprocedural cardiovascular examination: Secondary | ICD-10-CM | POA: Diagnosis not present

## 2016-07-16 DIAGNOSIS — Z01818 Encounter for other preprocedural examination: Secondary | ICD-10-CM | POA: Insufficient documentation

## 2016-07-16 HISTORY — DX: Gastro-esophageal reflux disease without esophagitis: K21.9

## 2016-07-16 LAB — CBC WITH DIFFERENTIAL/PLATELET
BASOS PCT: 0 %
Basophils Absolute: 0 10*3/uL (ref 0.0–0.1)
EOS ABS: 0.3 10*3/uL (ref 0.0–0.7)
EOS PCT: 4 %
HEMATOCRIT: 33.6 % — AB (ref 36.0–46.0)
HEMOGLOBIN: 10.3 g/dL — AB (ref 12.0–15.0)
LYMPHS PCT: 45 %
Lymphs Abs: 3.1 10*3/uL (ref 0.7–4.0)
MCH: 22.5 pg — AB (ref 26.0–34.0)
MCHC: 30.7 g/dL (ref 30.0–36.0)
MCV: 73.4 fL — ABNORMAL LOW (ref 78.0–100.0)
MONOS PCT: 7 %
Monocytes Absolute: 0.5 10*3/uL (ref 0.1–1.0)
NEUTROS PCT: 44 %
Neutro Abs: 3.1 10*3/uL (ref 1.7–7.7)
Platelets: 365 10*3/uL (ref 150–400)
RBC: 4.58 MIL/uL (ref 3.87–5.11)
RDW: 20.3 % — ABNORMAL HIGH (ref 11.5–15.5)
WBC: 7 10*3/uL (ref 4.0–10.5)

## 2016-07-16 LAB — BASIC METABOLIC PANEL
Anion gap: 6 (ref 5–15)
BUN: 12 mg/dL (ref 6–20)
CHLORIDE: 104 mmol/L (ref 101–111)
CO2: 24 mmol/L (ref 22–32)
CREATININE: 0.63 mg/dL (ref 0.44–1.00)
Calcium: 9.4 mg/dL (ref 8.9–10.3)
GFR calc non Af Amer: >60 mL/min (ref >60)
Glucose, Bld: 86 mg/dL (ref 65–99)
Potassium: 4 mmol/L (ref 3.5–5.1)
Sodium: 134 mmol/L — ABNORMAL LOW (ref 135–145)

## 2016-07-16 LAB — HCG, SERUM, QUALITATIVE: Preg, Serum: NEGATIVE

## 2016-07-21 ENCOUNTER — Encounter (HOSPITAL_COMMUNITY): Admission: RE | Payer: Self-pay | Source: Ambulatory Visit

## 2016-07-21 ENCOUNTER — Ambulatory Visit (HOSPITAL_COMMUNITY): Admission: RE | Admit: 2016-07-21 | Payer: Self-pay | Source: Ambulatory Visit | Admitting: Gastroenterology

## 2016-07-21 ENCOUNTER — Telehealth: Payer: Self-pay

## 2016-07-21 SURGERY — COLONOSCOPY WITH PROPOFOL
Anesthesia: Monitor Anesthesia Care

## 2016-07-21 MED ORDER — CHLORHEXIDINE GLUCONATE CLOTH 2 % EX PADS: 6.0000 | MEDICATED_PAD | Freq: Once | CUTANEOUS | Status: DC

## 2016-07-21 NOTE — Telephone Encounter (Signed)
Pt called this morning to inform us that she is not going to the bathroom like she should with the bowel prep. She has had a fever for two days.This morning her fever was 101.3. She is wanting to know if we could check blood count because she got like this before and her blood was low. She is needing to get reschedule before Nov.8 if possible because she is going to see Dr.Westcot. I told her that I did not know if we could do that. Please advise

## 2016-07-23 NOTE — Telephone Encounter (Signed)
Tried to call with no answer  

## 2016-07-23 NOTE — Telephone Encounter (Signed)
PLEASE CALL PT. SHE CAN HAVE A CBC. SHE CAN RSC HER TCS FIRST AVAILABLE FOR RECTAL BLEEDING AT [facility] OR DR. Lorin PicketWESTCOTT CAN ARRANGE FOR HER TO HAVE IT AT [facility].

## 2016-07-30 NOTE — Telephone Encounter (Signed)
Letter mailed to call the office.

## 2016-08-21 ENCOUNTER — Emergency Department
Admission: EM | Admit: 2016-08-21 | Discharge: 2016-08-21 | Disposition: A | Discharge status: Home / Self Care | Payer: Medicaid Other | Attending: Emergency Medicine | Admitting: Emergency Medicine

## 2016-08-21 ENCOUNTER — Encounter: Payer: Self-pay | Admitting: Emergency Medicine

## 2016-08-21 ENCOUNTER — Emergency Department: Payer: Medicaid Other

## 2016-08-21 DIAGNOSIS — R509 Fever, unspecified: Secondary | ICD-10-CM

## 2016-08-21 DIAGNOSIS — I5033 Acute on chronic diastolic (congestive) heart failure: Secondary | ICD-10-CM | POA: Insufficient documentation

## 2016-08-21 DIAGNOSIS — F17210 Nicotine dependence, cigarettes, uncomplicated: Secondary | ICD-10-CM | POA: Diagnosis not present

## 2016-08-21 DIAGNOSIS — J449 Chronic obstructive pulmonary disease, unspecified: Secondary | ICD-10-CM | POA: Insufficient documentation

## 2016-08-21 DIAGNOSIS — Z7984 Long term (current) use of oral hypoglycemic drugs: Secondary | ICD-10-CM | POA: Insufficient documentation

## 2016-08-21 DIAGNOSIS — R059 Cough, unspecified: Secondary | ICD-10-CM

## 2016-08-21 DIAGNOSIS — Z79899 Other long term (current) drug therapy: Secondary | ICD-10-CM | POA: Insufficient documentation

## 2016-08-21 DIAGNOSIS — R062 Wheezing: Secondary | ICD-10-CM

## 2016-08-21 DIAGNOSIS — J069 Acute upper respiratory infection, unspecified: Secondary | ICD-10-CM | POA: Insufficient documentation

## 2016-08-21 DIAGNOSIS — R0602 Shortness of breath: Secondary | ICD-10-CM

## 2016-08-21 DIAGNOSIS — E119 Type 2 diabetes mellitus without complications: Secondary | ICD-10-CM | POA: Diagnosis not present

## 2016-08-21 DIAGNOSIS — J189 Pneumonia, unspecified organism: Secondary | ICD-10-CM | POA: Insufficient documentation

## 2016-08-21 DIAGNOSIS — R05 Cough: Secondary | ICD-10-CM

## 2016-08-21 MED ORDER — ALBUTEROL SULFATE HFA 108 (90 BASE) MCG/ACT IN AERS: 2.0000 | INHALATION_SPRAY | Freq: Four times a day (QID) | RESPIRATORY_TRACT | 2 refills | Status: DC | PRN

## 2016-08-21 MED ORDER — AZITHROMYCIN 250 MG PO TABS: ORAL_TABLET | ORAL | 0 refills | Status: DC

## 2016-08-21 MED ORDER — IPRATROPIUM-ALBUTEROL 0.5-2.5 (3) MG/3ML IN SOLN
3.0000 mL | Freq: Once | RESPIRATORY_TRACT | Status: AC
  Administered 2016-08-21: 3 mL via RESPIRATORY_TRACT
  Filled 2016-08-21: qty 3

## 2016-08-21 MED ORDER — PREDNISONE 20 MG PO TABS: 40.0000 mg | ORAL_TABLET | Freq: Every day | ORAL | 0 refills | Status: DC

## 2016-08-21 NOTE — Discharge Instructions (Signed)
Please take medications as prescribed continue with over-the-counter medications. Return to the ER for any worsening shortness of breath, fevers or any urgent changes in her health.Follow up with your PCP first of next week for Xrays and repeat evaluation.

## 2016-08-21 NOTE — ED Provider Notes (Signed)
[facility]-EMERGENCY DEPARTMENT Provider Note   CSN: [id number] Arrival date & time: 08/21/16  1536     History   Chief Complaint Chief Complaint  Patient presents with  . Cough    HPI Shelby Hatfield is a 40 y.o. female presents to the emergency department for evaluation of cough, nasal congestion, and shortness of breath. Symptoms been present for 3 days. She's been taking over-the-counter Mucinex with dextromethorphan and phenylephrine. She complains of chest tightness, has a history of COPD taking her Symbicort inhaler daily. She states her cough is dry worse at nighttime. Her pain is tight with that her chest, she notices some wheezing with breathing. Low-grade fevers. No nausea vomiting or rashes.  HPI  Past Medical History:  Diagnosis Date  . Adenotonsillar hypertrophy 03/2012   snores during sleep; denies apnea; states occ. wakes up coughing  . Anemia   . Anxiety   . Bipolar 1 disorder (HCC)   . 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 Depression   . Diabetes mellitus without complication (HCC)   . GERD (gastroesophageal reflux disease)   . GI bleed   . Hypercholesterolemia   . Migraines   . Obesity   . Pneumonia   . Wears dentures    upper denture    Patient Active Problem List   Diagnosis Date Noted  . IDA (iron deficiency anemia) 06/30/2016  . Rectal bleeding 06/30/2016  . Chronic diarrhea 06/30/2016  . Gastroesophageal reflux disease with esophagitis   . Hiatal hernia   . Morbid obesity due to excess calories (HCC)   . Protein-calorie malnutrition, severe 06/02/2016  . Upper GI bleed 06/01/2016  . Abdominal pain 06/01/2016  . PUD (peptic ulcer disease) 06/01/2016  . Acute blood loss anemia 06/01/2016  . Acute GI bleeding 06/01/2016  . Abdominal pain, epigastric 02/12/2016  . Absolute anemia 02/12/2016  . Hematemesis 02/12/2016  . Pulmonary nodules 02/12/2016  . Acute on chronic diastolic  heart failure (HCC) 40/98/1191  . 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 Nausea with vomiting 01/20/2015  . 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 SOB (shortness of breath) 01/19/2015  . Lower leg edema 01/19/2015  . Diarrhea   . Diabetes mellitus type 2 in obese (HCC)   . COPD (chronic obstructive pulmonary disease) (HCC) 08/18/2014  . COPD exacerbation (HCC) 08/18/2014  . Chest tightness 08/18/2014  . Shortness of breath   . Opiate addiction (HCC) 05/22/2014  . 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 Opiate dependence, continuous (HCC) 04/20/2013  . Benzodiazepine dependence (HCC) 04/20/2013    Past Surgical History:  Procedure Laterality Date  . APPENDECTOMY    . BIOPSY  02/14/2016   Procedure: BIOPSY;  Surgeon: West Bali, MD;  Location: [facility];  Service: Endoscopy;;  gastric biopsies  . CESAREAN SECTION  08/31/2001   total of  3  . CHOLECYSTECTOMY    . DILATION AND EVACUATION  04/09/2000  . ESOPHAGOGASTRODUODENOSCOPY (EGD) WITH PROPOFOL N/A 02/14/2016   Procedure: ESOPHAGOGASTRODUODENOSCOPY (EGD) WITH PROPOFOL;  Surgeon: West Bali, MD;  Location: [facility];  Service: Endoscopy;  Laterality: N/A;  1315  . ESOPHAGOGASTRODUODENOSCOPY (EGD) WITH PROPOFOL N/A 06/02/2016   Procedure: ESOPHAGOGASTRODUODENOSCOPY (EGD) WITH PROPOFOL;  Surgeon: Dorena Cookey, MD;  Location: [facility] ENDOSCOPY;  Service: Endoscopy;  Laterality: N/A;  . open paraesophageal hernia repair with gastroexy  09/2015   Dr. Francee Gentile  .  TONSILLECTOMY     adenoidectomy  . TONSILLECTOMY AND ADENOIDECTOMY  04/12/2012   Procedure: TONSILLECTOMY AND ADENOIDECTOMY;  Surgeon: Darletta Moll, MD;  Location: [facility];  Service: ENT;  Laterality: Bilateral;  . TUBAL LIGATION  08/31/2001    OB History    Gravida Para Term Preterm AB Living   8 3 3   5 3    SAB TAB Ectopic Multiple Live Births   5               Home Medications    Prior to  Admission medications   Medication Sig Start Date End Date Taking? Authorizing Provider  acetaminophen (TYLENOL) 500 MG tablet Take 1,000 mg by mouth every 6 (six) hours as needed for mild pain.    Historical Provider, MD  acyclovir (ZOVIRAX) 400 MG tablet Take 1 tablet (400 mg total) by mouth 3 (three) times daily. 07/02/16   Zadie Rhine, MD  albuterol (PROVENTIL HFA;VENTOLIN HFA) 108 (90 Base) MCG/ACT inhaler Inhale 2 puffs into the lungs every 6 (six) hours as needed for wheezing or shortness of breath. 08/21/16   Evon Slack, PA-C  azithromycin (ZITHROMAX Z-PAK) 250 MG tablet Take 2 tablets (500 mg) on  Day 1,  followed by 1 tablet (250 mg) once daily on Days 2 through 5. 08/21/16   Evon Slack, PA-C  budesonide-formoterol [facility]) 160-4.5 MCG/ACT inhaler Inhale 2 puffs into the lungs 2 (two) times daily.    Historical Provider, MD  dextromethorphan-guaiFENesin (MUCINEX DM) 30-600 MG 12hr tablet Take 1 tablet by mouth 2 (two) times daily.    Historical Provider, MD  iron polysaccharides (NIFEREX) 150 MG capsule Take 1 capsule (150 mg total) by mouth 2 (two) times daily. 06/04/16   Vassie Loll, MD  loratadine (CLARITIN) 10 MG tablet Take 10 mg by mouth daily.    Historical Provider, MD  metFORMIN (GLUCOPHAGE) 500 MG tablet Take 500 mg by mouth daily as needed (Only takes if blood sugar becomes high).    Historical Provider, MD  metroNIDAZOLE (FLAGYL) 500 MG tablet Take 1 tablet (500 mg total) by mouth 2 (two) times daily. One po bid x 7 days 07/02/16   Zadie Rhine, MD  ondansetron (ZOFRAN ODT) 8 MG disintegrating tablet Take 1 tablet (8 mg total) by mouth every 8 (eight) hours as needed for nausea or vomiting. 06/04/16   Vassie Loll, MD  pantoprazole (PROTONIX) 40 MG tablet Take 1 tablet (40 mg total) by mouth 2 (two) times daily. 06/04/16   Vassie Loll, MD  predniSONE (DELTASONE) 20 MG tablet Take 2 tablets (40 mg total) by mouth daily. 08/21/16   Evon Slack, PA-C    promethazine (PHENERGAN) 25 MG tablet Take 25 mg by mouth every 8 (eight) hours as needed for nausea or vomiting.    Historical Provider, MD  sucralfate (CARAFATE) 1 g tablet Take 1 tablet (1 g total) by mouth 4 (four) times daily -  with meals and at bedtime. 02/10/16   Dione Booze, MD  traMADol (ULTRAM) 50 MG tablet Take 1 tablet (50 mg total) by mouth every 6 (six) hours as needed for severe pain. 06/30/16   Tiffany Kocher, PA-C    Family History Family History  Problem Relation Age of Onset  . Cancer Other   . Hypertension Mother   . Heart disease Mother   . Cancer Mother   . Cancer Maternal Aunt     not sure primary, in kidney and colon  . Cancer  Maternal Uncle     lung cancer   . Stroke Maternal Grandfather     Social History Social History  Substance Use Topics  . Smoking status: Current Some Day Smoker    Packs/day: 0.33    Years: 18.00    Types: Cigarettes  . Smokeless tobacco: Never Used  . Alcohol use No     Allergies   Lidocaine viscous and Penicillins   Review of Systems Review of Systems  Constitutional: Negative for chills and fever.  HENT: Positive for congestion and rhinorrhea. Negative for ear pain and sore throat.   Eyes: Negative for pain and visual disturbance.  Respiratory: Positive for cough and wheezing. Negative for shortness of breath.   Cardiovascular: Negative for chest pain and palpitations.  Gastrointestinal: Negative for abdominal pain and vomiting.  Genitourinary: Negative for dysuria and hematuria.  Musculoskeletal: Negative for arthralgias and back pain.  Skin: Negative for color change and rash.  Neurological: Negative for seizures and syncope.  All other systems reviewed and are negative.    Physical Exam Updated Vital Signs BP 121/61   Pulse (!) 103   Temp 99.5 F (37.5 C) (Oral)   Resp 17   Ht 5\' 1"  (1.549 m)   Wt 89.8 kg   LMP 08/12/2016 (Approximate)   SpO2 95%   BMI 37.41 kg/m   Physical Exam  Constitutional:  She is oriented to person, place, and time. She appears well-developed and well-nourished. No distress.  HENT:  Head: Normocephalic and atraumatic.  Right Ear: External ear normal.  Left Ear: External ear normal.  Nose: Nose normal.  Mouth/Throat: Oropharynx is clear and moist. No oropharyngeal exudate.  Eyes: Conjunctivae and EOM are normal. Pupils are equal, round, and reactive to light. Right eye exhibits no discharge. Left eye exhibits no discharge.  Neck: Normal range of motion. Neck supple.  Cardiovascular: Normal rate, regular rhythm and intact distal pulses.   Pulmonary/Chest: Effort normal. No respiratory distress. She has wheezes (positive expiratory wheezing and decreased air movement prior to albuterol treatment. No wheezing after treatment. Good air movement.). She has no rales. She exhibits no tenderness.  Abdominal: Soft. She exhibits no distension. There is no tenderness.  Musculoskeletal: Normal range of motion. She exhibits no edema.  Neurological: She is alert and oriented to person, place, and time. She has normal reflexes.  Skin: Skin is warm and dry.  Psychiatric: She has a normal mood and affect. Her behavior is normal. Thought content normal.     ED Treatments / Results  Labs (all labs ordered are listed, but only abnormal results are displayed) Labs Reviewed - No data to display  EKG  EKG Interpretation None       Radiology Dg Chest 2 View  Result Date: 08/21/2016 CLINICAL DATA:  Chest congestion.  Nasal congestion.  Fever EXAM: CHEST  2 VIEW COMPARISON:  01/24/2016. FINDINGS: Mediastinum hilar structures are unremarkable. Heart size normal. Diffuse bilateral pulmonary infiltrates are present. Follow-up chest x-rays recommended demonstrate clearing. No pleural effusion or pneumothorax. Sliding hiatal hernia. No acute bony abnormality . IMPRESSION: Diffuse bilateral pulmonary infiltrates consistent with pneumonia. Electronically Signed   By: Maisie Fushomas  Register    On: 08/21/2016 16:55    Procedures Procedures (including critical care time)  Medications Ordered in ED Medications  ipratropium-albuterol (DUONEB) 0.5-2.5 (3) MG/3ML nebulizer solution 3 mL (3 mLs Nebulization Given 08/21/16 1631)     Initial Impression / Assessment and Plan / ED Course  I have reviewed the triage vital signs and  the nursing notes.  Pertinent labs & imaging results that were available during my care of the patient were reviewed by me and considered in my medical decision making (see chart for details).  Clinical Course   40 year old female with history of COPD presents to the emergency department with congestion cough and wheezing. Chest x-ray shows infiltrates. Vital signs are reassuring for outpatient management. She is treated with albuterol inhaler, 5 days of steroids and macrolide antibiotic. PCP is at reasonable, discussed with patient and she will contact them first of next week for repeat evaluation x-rays. She is educated on signs and symptoms return to the emergency department for such as fevers or increasing difficulty with breathing.  Final Clinical Impressions(s) / ED Diagnoses   Final diagnoses:  Cough  SOB (shortness of breath)  Fever  Viral upper respiratory tract infection  Wheezing  Community acquired pneumonia, unspecified laterality    New Prescriptions Discharge Medication List as of 08/21/2016  5:38 PM    START taking these medications   Details  albuterol (PROVENTIL HFA;VENTOLIN HFA) 108 (90 Base) MCG/ACT inhaler Inhale 2 puffs into the lungs every 6 (six) hours as needed for wheezing or shortness of breath., Starting Fri 08/21/2016, Print    azithromycin (ZITHROMAX Z-PAK) 250 MG tablet Take 2 tablets (500 mg) on  Day 1,  followed by 1 tablet (250 mg) once daily on Days 2 through 5., Print    predniSONE (DELTASONE) 20 MG tablet Take 2 tablets (40 mg total) by mouth daily., Starting Fri 08/21/2016, Print         Evon Slackhomas C Meeya Goldin,  PA-C 08/21/16 1748    Minna AntisKevin Paduchowski, MD 08/21/16 2245

## 2016-08-21 NOTE — ED Triage Notes (Signed)
Pt reports chest and nasal congestion x3 days, reports fever last night. Has been taking mucinex without relief.

## 2016-09-02 ENCOUNTER — Inpatient Hospital Stay (HOSPITAL_COMMUNITY)
Admission: EM | Admit: 2016-09-02 | Discharge: 2016-09-05 | DRG: 377 | Disposition: A | Discharge status: Other Acute Inpatient Hospital | Payer: Medicaid Other | Attending: Family Medicine | Admitting: Family Medicine

## 2016-09-02 ENCOUNTER — Encounter (HOSPITAL_COMMUNITY): Payer: Self-pay

## 2016-09-02 DIAGNOSIS — K922 Gastrointestinal hemorrhage, unspecified: Secondary | ICD-10-CM | POA: Diagnosis present

## 2016-09-02 DIAGNOSIS — K254 Chronic or unspecified gastric ulcer with hemorrhage: Secondary | ICD-10-CM | POA: Diagnosis present

## 2016-09-02 DIAGNOSIS — D649 Anemia, unspecified: Secondary | ICD-10-CM | POA: Diagnosis not present

## 2016-09-02 DIAGNOSIS — Z7984 Long term (current) use of oral hypoglycemic drugs: Secondary | ICD-10-CM

## 2016-09-02 DIAGNOSIS — R109 Unspecified abdominal pain: Secondary | ICD-10-CM | POA: Diagnosis present

## 2016-09-02 DIAGNOSIS — G8929 Other chronic pain: Secondary | ICD-10-CM | POA: Diagnosis not present

## 2016-09-02 DIAGNOSIS — D509 Iron deficiency anemia, unspecified: Secondary | ICD-10-CM | POA: Diagnosis present

## 2016-09-02 DIAGNOSIS — E1169 Type 2 diabetes mellitus with other specified complication: Secondary | ICD-10-CM | POA: Diagnosis present

## 2016-09-02 DIAGNOSIS — E669 Obesity, unspecified: Secondary | ICD-10-CM | POA: Diagnosis not present

## 2016-09-02 DIAGNOSIS — K210 Gastro-esophageal reflux disease with esophagitis: Secondary | ICD-10-CM | POA: Diagnosis present

## 2016-09-02 DIAGNOSIS — K259 Gastric ulcer, unspecified as acute or chronic, without hemorrhage or perforation: Secondary | ICD-10-CM | POA: Diagnosis not present

## 2016-09-02 DIAGNOSIS — Z8249 Family history of ischemic heart disease and other diseases of the circulatory system: Secondary | ICD-10-CM | POA: Diagnosis not present

## 2016-09-02 DIAGNOSIS — K209 Esophagitis, unspecified without bleeding: Secondary | ICD-10-CM

## 2016-09-02 DIAGNOSIS — K449 Diaphragmatic hernia without obstruction or gangrene: Secondary | ICD-10-CM | POA: Diagnosis present

## 2016-09-02 DIAGNOSIS — Z6837 Body mass index (BMI) 37.0-37.9, adult: Secondary | ICD-10-CM

## 2016-09-02 DIAGNOSIS — K920 Hematemesis: Secondary | ICD-10-CM | POA: Diagnosis not present

## 2016-09-02 DIAGNOSIS — Z801 Family history of malignant neoplasm of trachea, bronchus and lung: Secondary | ICD-10-CM

## 2016-09-02 DIAGNOSIS — F17290 Nicotine dependence, other tobacco product, uncomplicated: Secondary | ICD-10-CM | POA: Diagnosis present

## 2016-09-02 DIAGNOSIS — E119 Type 2 diabetes mellitus without complications: Secondary | ICD-10-CM | POA: Diagnosis present

## 2016-09-02 DIAGNOSIS — Z888 Allergy status to other drugs, medicaments and biological substances status: Secondary | ICD-10-CM

## 2016-09-02 DIAGNOSIS — R1013 Epigastric pain: Secondary | ICD-10-CM | POA: Diagnosis present

## 2016-09-02 DIAGNOSIS — E43 Unspecified severe protein-calorie malnutrition: Secondary | ICD-10-CM

## 2016-09-02 DIAGNOSIS — Z880 Allergy status to penicillin: Secondary | ICD-10-CM | POA: Diagnosis not present

## 2016-09-02 DIAGNOSIS — Z79899 Other long term (current) drug therapy: Secondary | ICD-10-CM

## 2016-09-02 DIAGNOSIS — R42 Dizziness and giddiness: Secondary | ICD-10-CM | POA: Diagnosis present

## 2016-09-02 DIAGNOSIS — F419 Anxiety disorder, unspecified: Secondary | ICD-10-CM | POA: Diagnosis not present

## 2016-09-02 DIAGNOSIS — D62 Acute posthemorrhagic anemia: Secondary | ICD-10-CM | POA: Diagnosis present

## 2016-09-02 DIAGNOSIS — Z8711 Personal history of peptic ulcer disease: Secondary | ICD-10-CM

## 2016-09-02 DIAGNOSIS — Z823 Family history of stroke: Secondary | ICD-10-CM

## 2016-09-02 DIAGNOSIS — K921 Melena: Secondary | ICD-10-CM | POA: Diagnosis not present

## 2016-09-02 DIAGNOSIS — J449 Chronic obstructive pulmonary disease, unspecified: Secondary | ICD-10-CM | POA: Diagnosis not present

## 2016-09-02 HISTORY — DX: Heart failure, unspecified: I50.9

## 2016-09-02 LAB — COMPREHENSIVE METABOLIC PANEL
ALBUMIN: 3.1 g/dL — AB (ref 3.5–5.0)
ALT: 11 U/L — ABNORMAL LOW (ref 14–54)
ANION GAP: 5 (ref 5–15)
AST: 12 U/L — ABNORMAL LOW (ref 15–41)
Alkaline Phosphatase: 78 U/L (ref 38–126)
BILIRUBIN TOTAL: 0.1 mg/dL — AB (ref 0.3–1.2)
BUN: 9 mg/dL (ref 6–20)
CO2: 25 mmol/L (ref 22–32)
Calcium: 8.8 mg/dL — ABNORMAL LOW (ref 8.9–10.3)
Chloride: 105 mmol/L (ref 101–111)
Creatinine, Ser: 0.78 mg/dL (ref 0.44–1.00)
GFR calc Af Amer: >60 mL/min (ref >60)
GLUCOSE: 128 mg/dL — AB (ref 65–99)
POTASSIUM: 3.8 mmol/L (ref 3.5–5.1)
Sodium: 135 mmol/L (ref 135–145)
TOTAL PROTEIN: 6.6 g/dL (ref 6.5–8.1)

## 2016-09-02 LAB — RAPID URINE DRUG SCREEN, HOSP PERFORMED
Amphetamines: NOT DETECTED
Barbiturates: NOT DETECTED
Benzodiazepines: POSITIVE — AB
Cocaine: NOT DETECTED
OPIATES: POSITIVE — AB
TETRAHYDROCANNABINOL: POSITIVE — AB

## 2016-09-02 LAB — CBC
HEMATOCRIT: 22.8 % — AB (ref 36.0–46.0)
HEMOGLOBIN: 6.3 g/dL — AB (ref 12.0–15.0)
MCH: 20.7 pg — ABNORMAL LOW (ref 26.0–34.0)
MCHC: 27.6 g/dL — ABNORMAL LOW (ref 30.0–36.0)
MCV: 74.8 fL — ABNORMAL LOW (ref 78.0–100.0)
Platelets: 481 10*3/uL — ABNORMAL HIGH (ref 150–400)
RBC: 3.05 MIL/uL — ABNORMAL LOW (ref 3.87–5.11)
RDW: 18.8 % — ABNORMAL HIGH (ref 11.5–15.5)
WBC: 8.5 10*3/uL (ref 4.0–10.5)

## 2016-09-02 LAB — ABO/RH: ABO/RH(D): O POS

## 2016-09-02 LAB — GLUCOSE, CAPILLARY
GLUCOSE-CAPILLARY: 115 mg/dL — AB (ref 65–99)
Glucose-Capillary: 164 mg/dL — ABNORMAL HIGH (ref 65–99)

## 2016-09-02 LAB — POC OCCULT BLOOD, ED: Fecal Occult Bld: POSITIVE — AB

## 2016-09-02 LAB — PREPARE RBC (CROSSMATCH)

## 2016-09-02 LAB — LIPASE, BLOOD: Lipase: 30 U/L (ref 11–51)

## 2016-09-02 MED ORDER — SODIUM CHLORIDE 0.9 % IV SOLN
10.0000 mL/h | Freq: Once | INTRAVENOUS | Status: AC
Start: 2016-09-02 — End: 2016-09-02
  Administered 2016-09-02: 10 mL/h via INTRAVENOUS

## 2016-09-02 MED ORDER — ONDANSETRON HCL 4 MG/2ML IJ SOLN
4.0000 mg | Freq: Four times a day (QID) | INTRAMUSCULAR | Status: DC | PRN
  Administered 2016-09-03 – 2016-09-05 (×4): 4 mg via INTRAVENOUS
  Filled 2016-09-02 (×4): qty 2

## 2016-09-02 MED ORDER — INSULIN ASPART 100 UNIT/ML ~~LOC~~ SOLN: 0.0000 [IU] | Freq: Three times a day (TID) | SUBCUTANEOUS | Status: DC

## 2016-09-02 MED ORDER — SODIUM CHLORIDE 0.9 % IV SOLN: Freq: Once | INTRAVENOUS | Status: DC

## 2016-09-02 MED ORDER — SODIUM CHLORIDE 0.9 % IV SOLN
8.0000 mg/h | INTRAVENOUS | Status: DC
  Administered 2016-09-02 – 2016-09-05 (×5): 8 mg/h via INTRAVENOUS
  Filled 2016-09-02 (×12): qty 80

## 2016-09-02 MED ORDER — ACETAMINOPHEN 650 MG RE SUPP: 650.0000 mg | Freq: Four times a day (QID) | RECTAL | Status: DC | PRN

## 2016-09-02 MED ORDER — SODIUM CHLORIDE 0.9 % IV SOLN
80.0000 mg | Freq: Once | INTRAVENOUS | Status: AC
  Administered 2016-09-02: 80 mg via INTRAVENOUS
  Filled 2016-09-02: qty 80

## 2016-09-02 MED ORDER — MORPHINE SULFATE (PF) 2 MG/ML IV SOLN
2.0000 mg | INTRAVENOUS | Status: DC | PRN
  Administered 2016-09-02 – 2016-09-05 (×29): 2 mg via INTRAVENOUS
  Filled 2016-09-02 (×29): qty 1

## 2016-09-02 MED ORDER — SUCRALFATE 1 G PO TABS
1.0000 g | ORAL_TABLET | Freq: Four times a day (QID) | ORAL | Status: DC
  Administered 2016-09-02 – 2016-09-05 (×9): 1 g via ORAL
  Filled 2016-09-02 (×9): qty 1

## 2016-09-02 MED ORDER — ONDANSETRON HCL 4 MG PO TABS: 4.0000 mg | ORAL_TABLET | Freq: Four times a day (QID) | ORAL | Status: DC | PRN

## 2016-09-02 MED ORDER — FENTANYL CITRATE (PF) 100 MCG/2ML IJ SOLN
50.0000 ug | Freq: Once | INTRAMUSCULAR | Status: AC
  Administered 2016-09-02: 50 ug via INTRAVENOUS
  Filled 2016-09-02: qty 2

## 2016-09-02 MED ORDER — ACETAMINOPHEN 325 MG PO TABS: 650.0000 mg | ORAL_TABLET | Freq: Four times a day (QID) | ORAL | Status: DC | PRN

## 2016-09-02 MED ORDER — LACTATED RINGERS IV SOLN
INTRAVENOUS | Status: DC
  Administered 2016-09-03 – 2016-09-04 (×3): via INTRAVENOUS

## 2016-09-02 NOTE — ED Triage Notes (Signed)
Pt reports that she has a sharp LUQ pain for 3 days. States is constant. Vomiting this am. States looks like coffee grounds. Feels weak and no energy

## 2016-09-02 NOTE — ED Provider Notes (Signed)
[facility] Provider Note   CSN: [id number] Arrival date & time: 09/02/16  1337     History   Chief Complaint Chief Complaint  Patient presents with  . Abdominal Pain    HPI Shelby Hatfield is a 40 y.o. female.  HPI Patient presents with concern of upper abdominal pain nausea, vomiting, weakness. She acknowledges a history of prior GI bleed, peptic ulcer disease, but was generally well until about 3 days ago. About that time she developed pain focally in the upper abdomen, sharp, severe. Subsequent additional nausea, vomiting, has had multiple episodes of Vomiting dark material. Patient also notes black stool. Patient does take an iron supplement, and acknowledges a history of anemia as well. No syncope, no chest pain. Minimal relief with anything.    Past Medical History:  Diagnosis Date  . Adenotonsillar hypertrophy 03/2012   snores during sleep; denies apnea; states occ. wakes up coughing  . Anemia   . Anxiety   . Bipolar 1 disorder (HCC)   . 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 Depression   . Diabetes mellitus without complication (HCC)   . GERD (gastroesophageal reflux disease)   . GI bleed   . Hypercholesterolemia   . Migraines   . Obesity   . Pneumonia   . Wears dentures    upper denture    Patient Active Problem List   Diagnosis Date Noted  . IDA (iron deficiency anemia) 06/30/2016  . Rectal bleeding 06/30/2016  . Chronic diarrhea 06/30/2016  . Gastroesophageal reflux disease with esophagitis   . Hiatal hernia   . Morbid obesity due to excess calories (HCC)   . Protein-calorie malnutrition, severe 06/02/2016  . Upper GI bleed 06/01/2016  . Abdominal pain 06/01/2016  . PUD (peptic ulcer disease) 06/01/2016  . Acute blood loss anemia 06/01/2016  . Acute GI bleeding 06/01/2016  . Abdominal pain, epigastric 02/12/2016  . Absolute anemia 02/12/2016  . Hematemesis 02/12/2016  .  Pulmonary nodules 02/12/2016  . Acute on chronic diastolic heart failure (HCC) 01/21/2015  . 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 Nausea with vomiting 01/20/2015  . 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 SOB (shortness of breath) 01/19/2015  . Lower leg edema 01/19/2015  . Diarrhea   . Diabetes mellitus type 2 in obese (HCC)   . COPD (chronic obstructive pulmonary disease) (HCC) 08/18/2014  . COPD exacerbation (HCC) 08/18/2014  . Chest tightness 08/18/2014  . Shortness of breath   . Opiate addiction (HCC) 05/22/2014  . 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 Opiate dependence, continuous (HCC) 04/20/2013  . Benzodiazepine dependence (HCC) 04/20/2013    Past Surgical History:  Procedure Laterality Date  . APPENDECTOMY    . BIOPSY  02/14/2016   Procedure: BIOPSY;  Surgeon: West Bali, MD;  Location: [facility];  Service: Endoscopy;;  gastric biopsies  . CESAREAN SECTION  08/31/2001   total of  3  . CHOLECYSTECTOMY    . DILATION AND EVACUATION  04/09/2000  . ESOPHAGOGASTRODUODENOSCOPY (EGD) WITH PROPOFOL N/A 02/14/2016   Procedure: ESOPHAGOGASTRODUODENOSCOPY (EGD) WITH PROPOFOL;  Surgeon: West Bali, MD;  Location: [facility];  Service: Endoscopy;  Laterality: N/A;  1315  . ESOPHAGOGASTRODUODENOSCOPY (EGD) WITH PROPOFOL N/A 06/02/2016   Procedure: ESOPHAGOGASTRODUODENOSCOPY (EGD) WITH PROPOFOL;  Surgeon: Dorena Cookey, MD;  Location: [facility] ENDOSCOPY;  Service: Endoscopy;  Laterality: N/A;  . open paraesophageal hernia  repair with gastroexy  09/2015   Dr. Francee Gentile  . TONSILLECTOMY     adenoidectomy  . TONSILLECTOMY AND ADENOIDECTOMY  04/12/2012   Procedure: TONSILLECTOMY AND ADENOIDECTOMY;  Surgeon: Darletta Moll, MD;  Location: [facility];  Service: ENT;  Laterality: Bilateral;  . TUBAL LIGATION  08/31/2001    OB History    Gravida Para Term Preterm AB Living   8 3 3   5 3    SAB TAB Ectopic Multiple Live  Births   5               Home Medications    Prior to Admission medications   Medication Sig Start Date End Date Taking? Authorizing Provider  iron polysaccharides (NIFEREX) 150 MG capsule Take 1 capsule (150 mg total) by mouth 2 (two) times daily. 06/04/16  Yes Vassie Loll, MD  ondansetron (ZOFRAN ODT) 8 MG disintegrating tablet Take 1 tablet (8 mg total) by mouth every 8 (eight) hours as needed for nausea or vomiting. 06/04/16  Yes Vassie Loll, MD  pantoprazole (PROTONIX) 40 MG tablet Take 1 tablet (40 mg total) by mouth 2 (two) times daily. 06/04/16  Yes Vassie Loll, MD  acetaminophen (TYLENOL) 500 MG tablet Take 1,000 mg by mouth every 6 (six) hours as needed for mild pain.    Historical Provider, MD  acyclovir (ZOVIRAX) 400 MG tablet Take 1 tablet (400 mg total) by mouth 3 (three) times daily. Patient not taking: Reported on 09/02/2016 07/02/16   Zadie Rhine, MD  albuterol (PROVENTIL HFA;VENTOLIN HFA) 108 (90 Base) MCG/ACT inhaler Inhale 2 puffs into the lungs every 6 (six) hours as needed for wheezing or shortness of breath. 08/21/16   Evon Slack, PA-C  azithromycin (ZITHROMAX Z-PAK) 250 MG tablet Take 2 tablets (500 mg) on  Day 1,  followed by 1 tablet (250 mg) once daily on Days 2 through 5. Patient not taking: Reported on 09/02/2016 08/21/16   Evon Slack, PA-C  metFORMIN (GLUCOPHAGE) 500 MG tablet Take 500 mg by mouth daily as needed (Only takes if blood sugar becomes high).    Historical Provider, MD  metroNIDAZOLE (FLAGYL) 500 MG tablet Take 1 tablet (500 mg total) by mouth 2 (two) times daily. One po bid x 7 days Patient not taking: Reported on 09/02/2016 07/02/16   Zadie Rhine, MD  predniSONE (DELTASONE) 20 MG tablet Take 2 tablets (40 mg total) by mouth daily. Patient not taking: Reported on 09/02/2016 08/21/16   Evon Slack, PA-C  promethazine (PHENERGAN) 25 MG tablet Take 25 mg by mouth every 8 (eight) hours as needed for nausea or vomiting.    Historical  Provider, MD  sucralfate (CARAFATE) 1 g tablet Take 1 tablet (1 g total) by mouth 4 (four) times daily -  with meals and at bedtime. 02/10/16   Dione Booze, MD  traMADol (ULTRAM) 50 MG tablet Take 1 tablet (50 mg total) by mouth every 6 (six) hours as needed for severe pain. Patient not taking: Reported on 09/02/2016 06/30/16   Tiffany Kocher, PA-C    Family History Family History  Problem Relation Age of Onset  . Cancer Other   . Hypertension Mother   . Heart disease Mother   . Cancer Mother   . Cancer Maternal Aunt     not sure primary, in kidney and colon  . Cancer Maternal Uncle     lung cancer   . Stroke Maternal Grandfather     Social History Social History  Substance Use Topics  . Smoking status: Current Some Day Smoker    Packs/day: 0.33    Years: 18.00    Types: E-cigarettes  . Smokeless tobacco: Never Used  . Alcohol use No     Allergies   Lidocaine viscous and Penicillins   Review of Systems Review of Systems  Constitutional:       Per HPI, otherwise negative  HENT:       Per HPI, otherwise negative  Respiratory:       Per HPI, otherwise negative  Cardiovascular:       Per HPI, otherwise negative  Gastrointestinal: Positive for blood in stool, nausea and vomiting.  Endocrine:       Negative aside from HPI  Genitourinary:       Neg aside from HPI   Musculoskeletal:       Per HPI, otherwise negative  Skin: Negative.   Neurological: Positive for weakness. Negative for syncope.     Physical Exam Updated Vital Signs BP 119/78 (BP Location: Right Arm)   Pulse 82   Temp 98.1 F (36.7 C)   Resp 18   Ht 5\' 1"  (1.549 m)   Wt 196 lb (88.9 kg)   LMP 08/12/2016 (Approximate)   SpO2 99%   BMI 37.03 kg/m   Physical Exam  Constitutional: She is oriented to person, place, and time. She appears well-developed and well-nourished. No distress.  HENT:  Head: Normocephalic and atraumatic.  Eyes: Conjunctivae and EOM are normal.  Cardiovascular: Normal  rate and regular rhythm.   Pulmonary/Chest: Effort normal and breath sounds normal. No stridor. No respiratory distress.  Abdominal: She exhibits no distension.    Genitourinary:  Genitourinary Comments: Hemoccult-positive grossly negative stool  Musculoskeletal: She exhibits no edema.  Neurological: She is alert and oriented to person, place, and time. No cranial nerve deficit.  Skin: Skin is warm and dry.  Psychiatric: She has a normal mood and affect.  Nursing note and vitals reviewed.    ED Treatments / Results  Labs (all labs ordered are listed, but only abnormal results are displayed) Labs Reviewed  COMPREHENSIVE METABOLIC PANEL - Abnormal; Notable for the following:       Result Value   Glucose, Bld 128 (*)    Calcium 8.8 (*)    Albumin 3.1 (*)    AST 12 (*)    ALT 11 (*)    Total Bilirubin 0.1 (*)    All other components within normal limits  CBC - Abnormal; Notable for the following:    RBC 3.05 (*)    Hemoglobin 6.3 (*)    HCT 22.8 (*)    MCV 74.8 (*)    MCH 20.7 (*)    MCHC 27.6 (*)    RDW 18.8 (*)    Platelets 481 (*)    All other components within normal limits  LIPASE, BLOOD  URINALYSIS, ROUTINE W REFLEX MICROSCOPIC  TYPE AND SCREEN  PREPARE RBC (CROSSMATCH)     Procedures Procedures (including critical care time)  Medications Ordered in ED Medications  pantoprazole (PROTONIX) 80 mg in sodium chloride 0.9 % 100 mL IVPB (not administered)  pantoprazole (PROTONIX) 80 mg in sodium chloride 0.9 % 250 mL (0.32 mg/mL) infusion (not administered)  0.9 %  sodium chloride infusion (not administered)  0.9 %  sodium chloride infusion (not administered)     Initial Impression / Assessment and Plan / ED Course  I have reviewed the triage vital signs and the nursing notes.  Pertinent  labs & imaging results that were available during my care of the patient were reviewed by me and considered in my medical decision making (see chart for details).  Clinical  Course   Chart review notable for EGD May, 2017  [place]GD in May 2017 ([place] Darrick [place]Fields[place] shown gastric ulcer with gastritis, duodenal ulcer    After the initial evaluation, reviewed initial labs with her, notable for anemia, patient had type and cross, is prepared for transfusion. Subsequently I discussed patient's case with gastroenterology, confirmed and patient placement, consultation, initiation of clear fluids with ongoing Protonix, blood.  Subsequent discussed this case with our hospitalist team for admission.   Final Clinical Impressions(s) / ED Diagnoses   Final diagnoses:  Gastrointestinal hemorrhage, unspecified gastrointestinal hemorrhage type  Patient with a history of peptic ulcer disease, duodenal and, presents with weakness, vomiting, is found to have anemia, Hemoccult-positive stool, and concern for GI bleed. Given the patient's critically low hemoglobin level she was started on transfusion. After discussion with neurology, and our hospitalist colleagues, patient was admitted for further evaluation and management.   CRITICAL CARE Performed by: Gerhard MunchLOCKWOOD, Cheryel Kyte Total critical care time: 35 minutes Critical care time was exclusive of separately billable procedures and treating other patients. Critical care was necessary to treat or prevent imminent or life-threatening deterioration. Critical care was time spent personally by me on the following activities: development of treatment plan with patient and/or surrogate as well as nursing, discussions with consultants, evaluation of patient's response to treatment, examination of patient, obtaining history from patient or surrogate, ordering and performing treatments and interventions, ordering and review of laboratory studies, ordering and review of radiographic studies, pulse oximetry and re-evaluation of patient's condition.    Gerhard Munchobert Andrey Mccaskill, MD 09/02/16 [phone]

## 2016-09-02 NOTE — ED Notes (Signed)
CRITICAL VALUE ALERT  Critical value received:  Hemoglobin 6.3  Date of notification: 09/02/2016  Time of notification:  1425  Critical value read back:Yes.    Nurse who received alert:  [facility] RN  MD notified (1st page):  Dr. Angelena FormLockwoo Time of first page:  1425  MD notified (2nd page):  Time of second page:  Responding MD:  Dr. Jeraldine LootsLockwood  Time MD responded:  1425

## 2016-09-02 NOTE — H&P (Signed)
History and Physical    Shelby Hatfield:[id number] DOB: [birth year] DOA: 09/02/2016  PCP: No PCP Per Patient - Planning to see Dr. Sudie Bailey, Medicaid just became active Consultants:  [facility] - surgery, [facility]; [facility] - GI Patient coming from: home - lives with daughters and granddaughter; [name]: mother - [phone]  Chief Complaint: abdominal pain  HPI: Shelby Hatfield is a 40 y.o. 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  Very sluggish, hasn't felt right. Hands have been twitching with holding things, no strength in hands.  Epigastric pain radiates along left side and into back.  Unable to sleep.  Pain is even worse with vomiting.  Black coffee ground emesis.  Feels like she is eating razor blades.  Symptoms have been going on for 2 weeks, tried to push through it.  BMs are now black in color as well.  +nausea.  Irregular BMs.  +diarrhea.    Has h/o PUD, ongoing issues for the last 2 years.  Seen in ER at [facility] in 9/17 after admission at [facility] 2 weeks prior.  Was admitted at [facility] from 9/3-7/17 for UGI bleed, transfused 2 units PRBC and had EGD which showed large hiatal hernia with grade A esophagitis without bleeding and diffuse mild gastritis.  Has had 2 blood transfusions in the past (1 in childhood associated with trauma, 1 in September was also for UGI bleed).  Had surgery for hiatal hernia in 2/17; now with recurrent paraesophageal hernia with plan for thoracic approach/CT surgical repair (planned for 1/18).     Fever last week, had PNA.  Those symptoms have resolved.  Reports a possible pleural effusion, gets out of breath very easily (no effusion seen on 11/24 CXR).  ED Course: Per Dr. Jeraldine Loots:  Gastrointestinal hemorrhage, unspecified gastrointestinal hemorrhage type  Patient with a history of peptic ulcer disease, duodenal and, presents  with weakness, vomiting, is found to have anemia, Hemoccult-positive stool, and concern for GI bleed.  Given the patient's critically low hemoglobin level she was started on transfusion.  After discussion with neurology, and our hospitalist colleagues, patient was admitted for further evaluation and management.   Review of Systems: As per HPI; otherwise 10 point review of systems reviewed and negative.   Ambulatory Status:  Ambulates without assistance, but does stumble and get off balance at times  Past Medical History:  Diagnosis Date  . Adenotonsillar hypertrophy 03/2012   snores during sleep; denies apnea; states occ. wakes up coughing  . Anemia   . Anxiety   . Bipolar 1 disorder (HCC)   . 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 Depression   . Diabetes mellitus without complication (HCC)   . GERD (gastroesophageal reflux disease)   . GI bleed   . Heart failure (HCC)   . Hypercholesterolemia   . Migraines   . Obesity   . Pneumonia   . Wears dentures    upper denture    Past Surgical History:  Procedure Laterality Date  . APPENDECTOMY    . BIOPSY  02/14/2016   Procedure: BIOPSY;  Surgeon: West Bali, MD;  Location: [facility];  Service: Endoscopy;;  gastric biopsies  . CESAREAN SECTION  08/31/2001   total of  3  . CHOLECYSTECTOMY    . DILATION AND EVACUATION  04/09/2000  .  ESOPHAGOGASTRODUODENOSCOPY (EGD) WITH PROPOFOL N/A 02/14/2016   Procedure: ESOPHAGOGASTRODUODENOSCOPY (EGD) WITH PROPOFOL;  Surgeon: West Bali, MD;  Location: [facility];  Service: Endoscopy;  Laterality: N/A;  1315  . ESOPHAGOGASTRODUODENOSCOPY (EGD) WITH PROPOFOL N/A 06/02/2016   Procedure: ESOPHAGOGASTRODUODENOSCOPY (EGD) WITH PROPOFOL;  Surgeon: Dorena Cookey, MD;  Location: [facility] ENDOSCOPY;  Service: Endoscopy;  Laterality: N/A;  . HIATAL HERNIA REPAIR  10/2015   planning for repeat surgery in 1/18  . open paraesophageal hernia  repair with gastroexy  09/2015   Dr. Francee Gentile  . TONSILLECTOMY     adenoidectomy  . TONSILLECTOMY AND ADENOIDECTOMY  04/12/2012   Procedure: TONSILLECTOMY AND ADENOIDECTOMY;  Surgeon: Darletta Moll, MD;  Location: [facility];  Service: ENT;  Laterality: Bilateral;  . TUBAL LIGATION  08/31/2001    Social History   Social History  . Marital status: Divorced    Spouse name: N/A  . Number of children: 3  . Years of education: N/A   Occupational History  . unemployed    Social History Main Topics  . Smoking status: Current Some Day Smoker    Packs/day: 0.33    Years: 18.00    Types: E-cigarettes  . Smokeless tobacco: Never Used  . Alcohol use No  . Drug use:     Types: Marijuana     Comment: history of cocaine in 2002; "hit some weed over Thanksgiving"  . Sexual activity: Yes    Birth control/ protection: Surgical   Other Topics Concern  . Not on file   Social History Narrative  . No narrative on file    Allergies  Allergen Reactions  . Lidocaine Viscous Other (See Comments)    Created BLISTERS in the mouth  . Penicillins Itching and Rash    Has patient had a PCN reaction causing immediate rash, facial/tongue/throat swelling, SOB or lightheadedness with hypotension: Yes Has patient had a PCN reaction causing severe rash involving mucus membranes or skin necrosis: No Has patient had a PCN reaction that required hospitalization No Has patient had a PCN reaction occurring within the last 10 years: No If all of the above answers are "NO", then may proceed with Cephalosporin use.     Family History  Problem Relation Age of Onset  . Cancer Other   . Hypertension Mother   . Heart disease Mother   . Cancer Mother   . Cancer Maternal Aunt     not sure primary, in kidney and colon  . Cancer Maternal Uncle     lung cancer   . Stroke Maternal Grandfather     Prior to Admission medications   Medication Sig Start Date End Date Taking? Authorizing Provider    iron polysaccharides (NIFEREX) 150 MG capsule Take 1 capsule (150 mg total) by mouth 2 (two) times daily. 06/04/16  Yes Vassie Loll, MD  ondansetron (ZOFRAN ODT) 8 MG disintegrating tablet Take 1 tablet (8 mg total) by mouth every 8 (eight) hours as needed for nausea or vomiting. 06/04/16  Yes Vassie Loll, MD  pantoprazole (PROTONIX) 40 MG tablet Take 1 tablet (40 mg total) by mouth 2 (two) times daily. 06/04/16  Yes Vassie Loll, MD  acetaminophen (TYLENOL) 500 MG tablet Take 1,000 mg by mouth every 6 (six) hours as needed for mild pain.    Historical Provider, MD  acyclovir (ZOVIRAX) 400 MG tablet Take 1 tablet (400 mg total) by mouth 3 (three) times daily. Patient not taking: Reported on 09/02/2016 07/02/16   Zadie Rhine,  MD  albuterol (PROVENTIL HFA;VENTOLIN HFA) 108 (90 Base) MCG/ACT inhaler Inhale 2 puffs into the lungs every 6 (six) hours as needed for wheezing or shortness of breath. 08/21/16   Evon Slack, PA-C  azithromycin (ZITHROMAX Z-PAK) 250 MG tablet Take 2 tablets (500 mg) on  Day 1,  followed by 1 tablet (250 mg) once daily on Days 2 through 5. Patient not taking: Reported on 09/02/2016 08/21/16   Evon Slack, PA-C  metFORMIN (GLUCOPHAGE) 500 MG tablet Take 500 mg by mouth daily as needed (Only takes if blood sugar becomes high).    Historical Provider, MD  metroNIDAZOLE (FLAGYL) 500 MG tablet Take 1 tablet (500 mg total) by mouth 2 (two) times daily. One po bid x 7 days Patient not taking: Reported on 09/02/2016 07/02/16   Zadie Rhine, MD  predniSONE (DELTASONE) 20 MG tablet Take 2 tablets (40 mg total) by mouth daily. Patient not taking: Reported on 09/02/2016 08/21/16   Evon Slack, PA-C  promethazine (PHENERGAN) 25 MG tablet Take 25 mg by mouth every 8 (eight) hours as needed for nausea or vomiting.    Historical Provider, MD  sucralfate (CARAFATE) 1 g tablet Take 1 tablet (1 g total) by mouth 4 (four) times daily -  with meals and at bedtime. 02/10/16   Dione Booze,  MD  traMADol (ULTRAM) 50 MG tablet Take 1 tablet (50 mg total) by mouth every 6 (six) hours as needed for severe pain. Patient not taking: Reported on 09/02/2016 06/30/16   Tiffany Kocher, PA-C    Physical Exam: Vitals:   09/02/16 1353 09/02/16 1356 09/02/16 1508 09/02/16 1613  BP:  124/77 119/78 115/89  Pulse:  77 82 89  Resp:  16 18 16   Temp:  98.1 F (36.7 C)    SpO2:  100% 99% 100%  Weight: 88.9 kg (196 lb)     Height: 5\' 1"  (1.549 m)        General:  Appears calm and comfortable and is NAD but does appear fatigued and complains of severe abdominal pain Eyes:  PERRL, EOMI, normal lids, iris ENT:  grossly normal hearing, lips & tongue, mmm Neck:  no LAD, masses or thyromegaly Cardiovascular:  RRR, no m/r/g. No LE edema.  Respiratory:  CTA bilaterally, no w/r/r. Normal respiratory effort. Abdomen:  soft, diffusely TTP particularly in midepigastric and LUQ regions; nd, NABS Skin:  no rash or induration seen on limited exam, multiple tattoos Musculoskeletal:  grossly normal tone BUE/BLE, good ROM, no bony abnormality Psychiatric:  grossly normal mood and affect, speech fluent and appropriate, AOx3 Neurologic:  CN 2-12 grossly intact, moves all extremities in coordinated fashion, sensation intact  Labs on Admission: I have personally reviewed following labs and imaging studies  CBC:  Recent Labs Lab 09/02/16 1359  WBC 8.5  HGB 6.3*  HCT 22.8*  MCV 74.8*  PLT 481*   Basic Metabolic Panel:  Recent Labs Lab 09/02/16 1359  NA 135  K 3.8  CL 105  CO2 25  GLUCOSE 128*  BUN 9  CREATININE 0.78  CALCIUM 8.8*   GFR: Estimated Creatinine Clearance: 93.8 mL/min (by C-G formula based on SCr of 0.78 mg/dL). Liver Function Tests:  Recent Labs Lab 09/02/16 1359  AST 12*  ALT 11*  ALKPHOS 78  BILITOT 0.1*  PROT 6.6  ALBUMIN 3.1*    Recent Labs Lab 09/02/16 1359  LIPASE 30   No results for input(s): AMMONIA in the last 168 hours. Coagulation Profile: No  results for input(s): INR, PROTIME in the last 168 hours. Cardiac Enzymes: No results for input(s): CKTOTAL, CKMB, CKMBINDEX, TROPONINI in the last 168 hours. BNP (last 3 results) No results for input(s): PROBNP in the last 8760 hours. HbA1C: No results for input(s): HGBA1C in the last 72 hours. CBG: No results for input(s): GLUCAP in the last 168 hours. Lipid Profile: No results for input(s): CHOL, HDL, LDLCALC, TRIG, CHOLHDL, LDLDIRECT in the last 72 hours. Thyroid Function Tests: No results for input(s): TSH, T4TOTAL, FREET4, T3FREE, THYROIDAB in the last 72 hours. Anemia Panel: No results for input(s): VITAMINB12, FOLATE, FERRITIN, TIBC, IRON, RETICCTPCT in the last 72 hours. Urine analysis:    Component Value Date/Time   COLORURINE YELLOW 07/02/2016 1100   APPEARANCEUR CLEAR 07/02/2016 1100   APPEARANCEUR Clear 01/16/2015 1114   LABSPEC >1.030 (H) 07/02/2016 1100   LABSPEC 1.006 01/16/2015 1114   PHURINE 5.5 07/02/2016 1100   GLUCOSEU NEGATIVE 07/02/2016 1100   GLUCOSEU Negative 01/16/2015 1114   HGBUR TRACE (A) 07/02/2016 1100   BILIRUBINUR NEGATIVE 07/02/2016 1100   BILIRUBINUR Negative 01/16/2015 1114   KETONESUR NEGATIVE 07/02/2016 1100   PROTEINUR TRACE (A) 07/02/2016 1100   UROBILINOGEN 0.2 05/13/2015 1948   NITRITE NEGATIVE 07/02/2016 1100   LEUKOCYTESUR SMALL (A) 07/02/2016 1100   LEUKOCYTESUR Negative 01/16/2015 1114    Creatinine Clearance: Estimated Creatinine Clearance: 93.8 mL/min (by C-G formula based on SCr of 0.78 mg/dL).  Sepsis Labs: @LABRCNTIP (procalcitonin:4,lacticidven:4) )No results found for this or any previous visit (from the past 240 hour(s)).   Radiological Exams on Admission: No results found.  EKG: not done  Assessment/Plan Principal Problem:   GI bleed Active Problems:   Diabetes mellitus type 2 in obese (HCC)   Abdominal pain, epigastric   Acute blood loss anemia   Protein-calorie malnutrition, severe   GI bleed -Patient  with possible symptomatic anemia (fatigue) but clear evidence of blood loss anemia with Hgb currently 6.3, down from 10.3 on 10/19 -Heme positive stools -Patient with h/o PUD but I do not see indication that she received H. Pylori testing/treatment - will add POCT now -Patient likely needs repeat EGD to ensure no obvious areas of ongoing bleeding; if so, would suggest consideration of biopsies for H. Pylori testing as this can be more accurate -GI consult in AM, has seen Dr. Darrick PennaFields -Two units of blood were ordered by ED, will continue and recheck Hgb after transfusion/in AM.  - will admit to tele bed - NPO for possible EGD - LR at 100 mL/hr - Start IV pantoprazole drip - Zofran IV for nausea - Avoid NSAIDs and SQ heparin/Lovenox - Maintain IV access (2 large bore IVs if possible). -Of note, Glucophage can cause GI upset, maybe gastritis?  May be worth considering an alternate diabetes medication as an outpatient.  DM -Hold Glucophage -Cover with SSI -Check A1c  Malnutrition -Has been drinking protein supplements at home -Albumin remains low at 3.1 -Unlikely to have appropriate nutrition while still having ongoing GI issues, so it may not be necessary to request nutrition support again at this time   DVT prophylaxis:  SCDs Code Status: Full - confirmed with patient/family Family Communication: Parents present throughout evaluation  Disposition Plan:  Home once clinically improved Consults called: GI  Admission status: Admit - It is my clinical opinion that admission to INPATIENT is reasonable and necessary because this patient will require at least 2 midnights in the hospital to treat this condition based on the medical complexity of the problems  presented.  Given the aforementioned information, the predictability of an adverse outcome is felt to be significant.     Jonah BlueJennifer Nicol Herbig MD [facility]  If 7PM-7AM, please contact night-coverage [email or web address] Password  [email or web address]  09/02/2016, 4:58 PM

## 2016-09-02 NOTE — Progress Notes (Signed)
Lab called me to let me know pt lab work came back with positive antibody blood screen. Unable to transfuse blood now, [facility]. I was advised that perhaps after midnight, if not first thing tomorrow am.

## 2016-09-02 NOTE — ED Notes (Signed)
Called dept [phone] for SBAR. States Shanda BumpsJessica still needs to look at it and has not yet assigned it to anyone.

## 2016-09-03 ENCOUNTER — Inpatient Hospital Stay (HOSPITAL_COMMUNITY): Payer: Medicaid Other | Admitting: Anesthesiology

## 2016-09-03 ENCOUNTER — Encounter (HOSPITAL_COMMUNITY)
Admission: EM | Disposition: A | Discharge status: Other Acute Inpatient Hospital | Payer: Self-pay | Source: Home / Self Care | Attending: Family Medicine

## 2016-09-03 ENCOUNTER — Encounter (HOSPITAL_COMMUNITY): Payer: Self-pay | Admitting: Gastroenterology

## 2016-09-03 DIAGNOSIS — E1169 Type 2 diabetes mellitus with other specified complication: Secondary | ICD-10-CM

## 2016-09-03 DIAGNOSIS — K449 Diaphragmatic hernia without obstruction or gangrene: Secondary | ICD-10-CM

## 2016-09-03 DIAGNOSIS — R1013 Epigastric pain: Secondary | ICD-10-CM

## 2016-09-03 DIAGNOSIS — K921 Melena: Secondary | ICD-10-CM

## 2016-09-03 DIAGNOSIS — G8929 Other chronic pain: Secondary | ICD-10-CM

## 2016-09-03 DIAGNOSIS — D649 Anemia, unspecified: Secondary | ICD-10-CM

## 2016-09-03 DIAGNOSIS — K920 Hematemesis: Secondary | ICD-10-CM

## 2016-09-03 DIAGNOSIS — D509 Iron deficiency anemia, unspecified: Secondary | ICD-10-CM

## 2016-09-03 DIAGNOSIS — E669 Obesity, unspecified: Secondary | ICD-10-CM

## 2016-09-03 HISTORY — PX: ESOPHAGOGASTRODUODENOSCOPY (EGD) WITH PROPOFOL: SHX5813

## 2016-09-03 HISTORY — PX: ESOPHAGEAL DILATION: SHX303

## 2016-09-03 LAB — BASIC METABOLIC PANEL
ANION GAP: 5 (ref 5–15)
BUN: 8 mg/dL (ref 6–20)
CALCIUM: 8.8 mg/dL — AB (ref 8.9–10.3)
CO2: 25 mmol/L (ref 22–32)
Chloride: 108 mmol/L (ref 101–111)
Creatinine, Ser: 0.82 mg/dL (ref 0.44–1.00)
GFR calc Af Amer: >60 mL/min (ref >60)
GLUCOSE: 123 mg/dL — AB (ref 65–99)
Potassium: 4.2 mmol/L (ref 3.5–5.1)
SODIUM: 138 mmol/L (ref 135–145)

## 2016-09-03 LAB — CBC
HCT: 28.8 % — ABNORMAL LOW (ref 36.0–46.0)
HCT: 26.9 % — ABNORMAL LOW (ref 36.0–46.0)
HEMOGLOBIN: 8.6 g/dL — AB (ref 12.0–15.0)
Hemoglobin: 8.3 g/dL — ABNORMAL LOW (ref 12.0–15.0)
MCH: 22.8 pg — ABNORMAL LOW (ref 26.0–34.0)
MCH: 23.2 pg — ABNORMAL LOW (ref 26.0–34.0)
MCHC: 29.9 g/dL — AB (ref 30.0–36.0)
MCHC: 30.9 g/dL (ref 30.0–36.0)
MCV: 76.4 fL — ABNORMAL LOW (ref 78.0–100.0)
MCV: 75.4 fL — ABNORMAL LOW (ref 78.0–100.0)
Platelets: 424 10*3/uL — ABNORMAL HIGH (ref 150–400)
Platelets: 372 10*3/uL (ref 150–400)
RBC: 3.77 MIL/uL — ABNORMAL LOW (ref 3.87–5.11)
RBC: 3.57 MIL/uL — ABNORMAL LOW (ref 3.87–5.11)
RDW: 18.3 % — AB (ref 11.5–15.5)
RDW: 18.4 % — AB (ref 11.5–15.5)
WBC: 8.1 10*3/uL (ref 4.0–10.5)
WBC: 7.8 10*3/uL (ref 4.0–10.5)

## 2016-09-03 LAB — GLUCOSE, CAPILLARY
GLUCOSE-CAPILLARY: 113 mg/dL — AB (ref 65–99)
GLUCOSE-CAPILLARY: 111 mg/dL — AB (ref 65–99)
GLUCOSE-CAPILLARY: 131 mg/dL — AB (ref 65–99)
Glucose-Capillary: 95 mg/dL (ref 65–99)
Glucose-Capillary: 80 mg/dL (ref 65–99)

## 2016-09-03 LAB — POCT I-STAT 4, (NA,K, GLUC, HGB,HCT)
GLUCOSE: 87 mg/dL (ref 65–99)
HCT: 24 % — ABNORMAL LOW (ref 36.0–46.0)
HEMOGLOBIN: 8.2 g/dL — AB (ref 12.0–15.0)
POTASSIUM: 4.2 mmol/L (ref 3.5–5.1)
Sodium: 141 mmol/L (ref 135–145)

## 2016-09-03 LAB — HEMOGLOBIN A1C
HEMOGLOBIN A1C: 5.9 % — AB (ref 4.8–5.6)
MEAN PLASMA GLUCOSE: 123 mg/dL

## 2016-09-03 SURGERY — ESOPHAGOGASTRODUODENOSCOPY (EGD) WITH PROPOFOL
Anesthesia: Monitor Anesthesia Care

## 2016-09-03 MED ORDER — PROPOFOL 500 MG/50ML IV EMUL
INTRAVENOUS | Status: DC | PRN
  Administered 2016-09-03: 100 ug/kg/min via INTRAVENOUS
  Administered 2016-09-03: 80 ug/kg/min via INTRAVENOUS

## 2016-09-03 MED ORDER — LACTATED RINGERS IV SOLN
INTRAVENOUS | Status: DC
  Administered 2016-09-03: 12:00:00 via INTRAVENOUS

## 2016-09-03 MED ORDER — LIDOCAINE HCL (PF) 1 % IJ SOLN
INTRAMUSCULAR | Status: AC
  Filled 2016-09-03: qty 5

## 2016-09-03 MED ORDER — PROPOFOL 10 MG/ML IV BOLUS
INTRAVENOUS | Status: DC | PRN
  Administered 2016-09-03 (×5): 20 mg via INTRAVENOUS

## 2016-09-03 MED ORDER — PROPOFOL 10 MG/ML IV BOLUS
INTRAVENOUS | Status: AC
Start: 2016-09-03 — End: 2016-09-03
  Filled 2016-09-03: qty 40

## 2016-09-03 MED ORDER — PROPOFOL 10 MG/ML IV BOLUS: INTRAVENOUS | Status: DC | PRN

## 2016-09-03 MED ORDER — SODIUM CHLORIDE 0.9 % IV SOLN: INTRAVENOUS | Status: DC

## 2016-09-03 MED ORDER — FENTANYL CITRATE (PF) 100 MCG/2ML IJ SOLN
25.0000 ug | INTRAMUSCULAR | Status: AC | PRN
  Administered 2016-09-03 (×2): 25 ug via INTRAVENOUS

## 2016-09-03 MED ORDER — DEXTROSE 50 % IV SOLN
INTRAVENOUS | Status: AC
Start: 2016-09-03 — End: 2016-09-03
  Filled 2016-09-03: qty 50

## 2016-09-03 MED ORDER — MIDAZOLAM HCL 2 MG/2ML IJ SOLN
1.0000 mg | INTRAMUSCULAR | Status: DC | PRN
  Administered 2016-09-03: 2 mg via INTRAVENOUS

## 2016-09-03 MED ORDER — MIDAZOLAM HCL 2 MG/2ML IJ SOLN
INTRAMUSCULAR | Status: AC
  Filled 2016-09-03: qty 2

## 2016-09-03 MED ORDER — DEXTROSE 50 % IV SOLN
25.0000 mL | Freq: Once | INTRAVENOUS | Status: AC
  Administered 2016-09-03: 25 mL via INTRAVENOUS

## 2016-09-03 MED ORDER — FENTANYL CITRATE (PF) 100 MCG/2ML IJ SOLN
INTRAMUSCULAR | Status: AC
  Filled 2016-09-03: qty 2

## 2016-09-03 MED ORDER — ONDANSETRON HCL 4 MG/2ML IJ SOLN
INTRAMUSCULAR | Status: AC
  Filled 2016-09-03: qty 2

## 2016-09-03 NOTE — Progress Notes (Signed)
PROGRESS NOTE    Shelby Hatfield  UJW:[id number] DOB: Jan 19, 1976 DOA: 09/02/2016 PCP: No PCP Per Patient   Brief Narrative:  40 y.o. 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   Assessment & Plan:   Principal Problem:   Gastrointestinal hemorrhage - Gastroenterology on board and plan is for further evaluation with endoscopy. - For now we'll continue to monitor CBCs every 8 hours - Agree with sucralfate  Active Problems:   Diabetes mellitus type 2 in obese (HCC) - Once able to advance diet will place on diabetic diet - Otherwise SSI    Abdominal pain, chronic, epigastric - Supportive therapy    Acute blood loss anemia - Monitor hemoglobin levels and transfuse if hemoglobin 8 or less    Protein-calorie malnutrition, severe - Once able to advance diet as tolerated and consider nutritional supplementation pending intake   DVT prophylaxis: SCDs Code Status: Full Family Communication: Discussed directly with patient Disposition Plan: Continue to monitor in house   Consultants:   Gastroenterology   Procedures: Pending   Antimicrobials: None   Subjective: Patient has no new complaints today. Has not noticed any new bleeding but she reports she has not had any bowel movements.  Objective: Vitals:   09/03/16 0830 09/03/16 1104 09/03/16 1230 09/03/16 1324  BP: 111/61 (!) 102/54  108/62  Pulse: 72 (!) 59 64 63  Resp: 18 17 17 18   Temp: 98.9 F (37.2 C) 98.5 F (36.9 C) 98.5 F (36.9 C) 97.9 F (36.6 C)  TempSrc: Oral Oral  Oral  SpO2: 99%  95% 100%  Weight:  88.9 kg (196 lb)    Height:  5\' 1"  (1.549 m)      Intake/Output Summary (Last 24 hours) at 09/03/16 1452 Last data filed at 09/03/16 1216  Gross per  24 hour  Intake          1484.58 ml  Output                0 ml  Net          1484.58 ml   Filed Weights   09/02/16 1353 09/03/16 1104  Weight: 88.9 kg (196 lb) 88.9 kg (196 lb)    Examination:  General exam: Appears calm and comfortable  Respiratory system: Clear to auscultation. Respiratory effort normal. Cardiovascular system: S1 & S2 heard, RRR. No JVD, murmurs, rubs, gallops or clicks. No pedal edema. Gastrointestinal system: Abdomen is nondistended, soft and no guarding. Hypoactive bowel sounds Central nervous system: Alert and oriented. No focal neurological deficits. Extremities: Symmetric 5 x 5 power. Skin: No rashes, lesions or ulcers Psychiatry: Judgement and insight appear normal. Mood & affect appropriate.   Data Reviewed: I have personally reviewed following labs and imaging studies  CBC:  Recent Labs Lab 09/02/16 1359 09/03/16 1133 09/03/16 1410  WBC 8.5  --  8.1  HGB 6.3* 8.2* 8.6*  HCT 22.8* 24.0* 28.8*  MCV 74.8*  --  76.4*  PLT 481*  --  424*   Basic Metabolic Panel:  Recent Labs Lab 09/02/16 1359 09/03/16 1133 09/03/16 1410  NA 135 141 138  K 3.8 4.2 4.2  CL 105  --  108  CO2 25  --  25  GLUCOSE 128* 87 123*  BUN 9  --  8  CREATININE 0.78  --  0.82  CALCIUM 8.8*  --  8.8*   GFR: Estimated Creatinine Clearance: 91.5 mL/min (by C-G formula based on SCr of 0.82 mg/dL). Liver Function Tests:  Recent Labs Lab 09/02/16 1359  AST 12*  ALT 11*  ALKPHOS 78  BILITOT 0.1*  PROT 6.6  ALBUMIN 3.1*    Recent Labs Lab 09/02/16 1359  LIPASE 30   No results for input(s): AMMONIA in the last 168 hours. Coagulation Profile: No results for input(s): INR, PROTIME in the last 168 hours. Cardiac Enzymes: No results for input(s): CKTOTAL, CKMB, CKMBINDEX, TROPONINI in the last 168 hours. BNP (last 3 results) No results for input(s): PROBNP in the last 8760 hours. HbA1C:  Recent Labs  09/02/16 1845  HGBA1C 5.9*   CBG:  Recent  Labs Lab 09/02/16 2001 09/02/16 2328 09/03/16 0441 09/03/16 0725 09/03/16 1230  GLUCAP 115* 164* 95 80 113*   Lipid Profile: No results for input(s): CHOL, HDL, LDLCALC, TRIG, CHOLHDL, LDLDIRECT in the last 72 hours. Thyroid Function Tests: No results for input(s): TSH, T4TOTAL, FREET4, T3FREE, THYROIDAB in the last 72 hours. Anemia Panel: No results for input(s): VITAMINB12, FOLATE, FERRITIN, TIBC, IRON, RETICCTPCT in the last 72 hours. Sepsis Labs: No results for input(s): PROCALCITON, LATICACIDVEN in the last 168 hours.  No results found for this or any previous visit (from the past 240 hour(s)).       Radiology Studies: No results found.   Scheduled Meds: . sodium chloride   Intravenous Once  . insulin aspart  0-15 Units Subcutaneous TID WC  . sucralfate  1 g Oral Q6H   Continuous Infusions: . lactated ringers 100 mL/hr (09/03/16 [id number])  . pantoprozole (PROTONIX) infusion 8 mg/hr (09/03/16 0635)     LOS: 1 day    Time spent: > 35 minutes  Penny PiaVEGA, Miyo Aina, MD [facility] Pager [phone]  If 7PM-7AM, please contact night-coverage [email or web address] Password [email or web address] 09/03/2016, 2:52 PM

## 2016-09-03 NOTE — Anesthesia Postprocedure Evaluation (Signed)
Anesthesia Post Note  Patient: Shelby Hatfield  Procedure(s) Performed: Procedure(s) (LRB): ESOPHAGOGASTRODUODENOSCOPY (EGD) WITH PROPOFOL (N/A) ESOPHAGEAL DILATION (N/A)  Patient location during evaluation: PACU Anesthesia Type: MAC Level of consciousness: awake Pain management: satisfactory to patient Vital Signs Assessment: post-procedure vital signs reviewed and stable Respiratory status: spontaneous breathing Cardiovascular status: stable Anesthetic complications: no    Last Vitals:  Vitals:   09/03/16 1230 09/03/16 1324  BP:  108/62  Pulse: 64 63  Resp: 17 18  Temp: 36.9 C 36.6 C    Last Pain:  Vitals:   09/03/16 1343  TempSrc:   PainSc: 0-No pain                 Rian Busche

## 2016-09-03 NOTE — Transfer of Care (Signed)
Immediate Anesthesia Transfer of Care Note  Patient: Shelby Hatfield  Procedure(s) Performed: Procedure(s): ESOPHAGOGASTRODUODENOSCOPY (EGD) WITH PROPOFOL (N/A) ESOPHAGEAL DILATION (N/A)  Patient Location: PACU  Anesthesia Type:MAC  Level of Consciousness: awake and patient cooperative  Airway & Oxygen Therapy: Patient Spontanous Breathing and Patient connected to nasal cannula oxygen  Post-op Assessment: Report given to RN and Post -op Vital signs reviewed and stable  Post vital signs: Reviewed  Last Vitals:  Vitals:   09/03/16 0830 09/03/16 1104  BP: 111/61 (!) 102/54  Pulse: 72 (!) 59  Resp: 18 17  Temp: 37.2 C 36.9 C    Last Pain:  Vitals:   09/03/16 1104  TempSrc: Oral  PainSc: 8       Patients Stated Pain Goal: 6 (09/03/16 1104)  Complications: No apparent anesthesia complications

## 2016-09-03 NOTE — Anesthesia Procedure Notes (Signed)
Procedure Name: MAC Date/Time: 09/03/2016 11:56 AM Performed by: Franco NonesYATES, Shelby Reitano S Pre-anesthesia Checklist: Patient identified, Emergency Drugs available, Suction available, Timeout performed and Patient being monitored Patient Re-evaluated:Patient Re-evaluated prior to inductionOxygen Delivery Method: Non-rebreather mask

## 2016-09-03 NOTE — Care Management Note (Signed)
Case Management Note  Patient Details  Name: Shelby Hatfield MRN: [id number] Date of Birth: 06/21/1976  Subjective/Objective:    Patient adm from home with GI Bleed. She lives with a roommate and states she will need to find a new place to live in a couple of weeks.  She is currently establishing care at Dr. Michelle NasutiKnowlton's office. She has transportation and insurance with drug coverage.               Action/Plan: Anticipate DC home with self care. Poss PT consult. Will follow.    Expected Discharge Date:        09/04/2016          Expected Discharge Plan:  Home/Self Care  In-House Referral:  NA  Discharge planning Services  CM Consult  Post Acute Care Choice:    Choice offered to:     DME Arranged:    DME Agency:     HH Arranged:    HH Agency:     Status of Service:  In process, will continue to follow  If discussed at Long Length of Stay Meetings, dates discussed:    Additional Comments:  Hassaan Crite, [name], RN 09/03/2016, 3:17 PM

## 2016-09-03 NOTE — Anesthesia Preprocedure Evaluation (Signed)
Anesthesia Evaluation  Patient identified by MRN, date of birth, ID band Patient awake    Reviewed: Allergy & Precautions, H&P , NPO status , Patient's Chart, lab work & pertinent test results  Airway Mallampati: II  TM Distance: >3 FB Neck ROM: Full    Dental no notable dental hx. (+) Upper Dentures, Dental Advisory Given   Pulmonary shortness of breath, COPD,  COPD inhaler, Current Smoker,    Pulmonary exam normal breath sounds clear to auscultation       Cardiovascular negative cardio ROS   Rhythm:Regular Rate:Normal     Neuro/Psych  Headaches, Anxiety Depression Bipolar Disorder    GI/Hepatic Neg liver ROS, PUD, GERD  ,  Endo/Other  diabetes, Type 2, Oral Hypoglycemic AgentsMorbid obesity  Renal/GU negative Renal ROS  negative genitourinary   Musculoskeletal   Abdominal   Peds  Hematology  (+) anemia ,   Anesthesia Other Findings GI bleed Hb=6.3, transfused 2 PC.  Reproductive/Obstetrics negative OB ROS                             Anesthesia Physical Anesthesia Plan  ASA: III and emergent  Anesthesia Plan: MAC   Post-op Pain Management:    Induction: Intravenous  Airway Management Planned: Simple Face Mask  Additional Equipment:   Intra-op Plan:   Post-operative Plan:   Informed Consent: I have reviewed the patients History and Physical, chart, labs and discussed the procedure including the risks, benefits and alternatives for the proposed anesthesia with the patient or authorized representative who has indicated his/her understanding and acceptance.     Plan Discussed with: CRNA  Anesthesia Plan Comments:         Anesthesia Quick Evaluation

## 2016-09-03 NOTE — Op Note (Addendum)
[facility] Patient Name: Shelby Hatfield Procedure Date: 09/03/2016 11:33 AM MRN: [id number] Date of Birth: [birth year] Attending MD: Gennette Pac , MD CSN: [id number] Age: 40 Admit Type: Inpatient Procedure:                Upper GI endoscopy - diagnostic Indications:              Hematemesis Providers:                Gennette Pac, MD, Jannett Celestine, RN, Criselda Peaches.                            Patsy Lager, RN, Burke Keels, Technician Referring MD:             Hospitalist Medicines:                Propofol per Anesthesia Complications:            No immediate complications. Estimated Blood Loss:     Estimated blood loss: none. Procedure:                Pre-Anesthesia Assessment:                           - Prior to the procedure, a History and Physical                            was performed, and patient medications and                            allergies were reviewed. The patient's tolerance of                            previous anesthesia was also reviewed. The risks                            and benefits of the procedure and the sedation                            options and risks were discussed with the patient.                            All questions were answered, and informed consent                            was obtained. Prior Anticoagulants: The patient has                            taken no previous anticoagulant or antiplatelet                            agents. ASA Grade Assessment: III - A patient with                            severe systemic disease. After reviewing the risks  and benefits, the patient was deemed in                            satisfactory condition to undergo the procedure.                           After obtaining informed consent, the endoscope was                            passed under direct vision. Throughout the                            procedure, the patient's blood pressure, pulse, and                      oxygen saturations were monitored continuously. The                            [id number] ([id number] scope was introduced through the                            mouth, and advanced to the second part of duodenum.                            The upper GI endoscopy was accomplished without                            difficulty. The patient tolerated the procedure                            well. Scope In: 12:05:37 PM Scope Out: 12:18:34 PM Total Procedure Duration: 0 hours 12 minutes 57 seconds  Findings:      LA Grade D (one or more mucosal breaks involving at least 75% of       esophageal circumference) esophagitis was found 30 to 27 cm from the       incisors. No Barrett's epithelium seen.      GE junction at 30 cm. Diaphragmatic hiatus found at 40 cm from the       incisors. There was a significant amount of retained food debris in the       hiatal hernia sac. Straddling the diaphragmatic hiatus was a rather       large 1.5 cm X 3 cm gastric ulcer. No bleeding stigmata. J-shaped       gastric cavity. Antrum appeared normal. Patent pylorus. Normal-appearing       first and second portion of the duodenum.      .      . Impression:               - LA Grade D esophagitis.                           - Large hiatal hernia. Large gastric ulcer                            straddling the diaphragmatic hiatus. Could be a  Sheria Lang- type lesion. I cannot exclude the                            possibility of intermittent torsion. History of a                            paraesophageal hernia - well documented previously                            although I could not discerne such anatomy on                            today's examination.                           - Normal duodenal bulb and second portion of the                            duodenum.                           - Retained gastric contents in the setting of a                            diabetes  on multiple medications (last report of                            solid food intake about 36 hrs before this                            procedure). May be a manifestation of delayed                            gastric emptying. However, given her known history                            of paraesophageal hernia and today's findings, I                            feel this patient should go ahead and have her                            hernia repaired expeditiously to relieve her                            symptoms and avoid a future intra-abdominal                            catastrophe.                           I would favor referral of patient over to [facility]  now and not wait until her scheduled appointment                            next month.                           - Moderate Sedation:      Moderate (conscious) sedation was personally administered by an       anesthesia professional. The following parameters were monitored: oxygen       saturation, heart rate, blood pressure, respiratory rate, EKG, adequacy       of pulmonary ventilation, and response to care. Total physician       intraservice time was 18 minutes. Recommendation:           - Return patient to hospital ward for ongoing care.                           - Clear liquid diet.                           - Continue present medications. Transition to twice                            daily PPI therapy.                           - No repeat upper endoscopy.                           - Refer to a Careers adviser. Procedure Code(s):        --- Professional ---                           [phone], Esophagogastroduodenoscopy, flexible,                            transoral; diagnostic, including collection of                            specimen(s) by brushing or washing, when performed                            (separate procedure) Diagnosis Code(s):        --- Professional ---                           K20.9,  Esophagitis, unspecified                           K25.9, Gastric ulcer, unspecified as acute or                            chronic, without hemorrhage or perforation                           K44.9, Diaphragmatic hernia without obstruction or  gangrene                           K92.0, Hematemesis CPT copyright 2016 American [employer] Association. All rights reserved. The codes documented in this report are preliminary and upon coder review may  be revised to meet current compliance requirements. Gerrit Friendsobert M. Gavyn Ybarra, MD Gennette Pacobert Michael Jeorge Reister, MD 09/03/2016 1:05:22 PM This report has been signed electronically. Number of Addenda: 0

## 2016-09-03 NOTE — Consult Note (Signed)
Referring Provider: Penny Pia, MD Primary Care Physician:  No PCP Per Patient Primary Gastroenterologist:  Jonette Eva, MD  Reason for Consultation:  Epigastric pain, profound anemia, heme + stool, coffee ground emesis, melena.   HPI: Shelby Hatfield is a 40 y.o. female with h/o DM, COPD, paraesophageal hernia s/p unsuccessful repair by Dr. Francee Gentile at [facility], gastritis who presented with epigastric pain, weakness, vomiting, melena. Feels like eating razor blades. Complains of all sensation when she swallows. Food feels like it sticking. Consuming mostly liquid diet at this point. Symptoms worse the past last 2 weeks. Has been vomiting for four days. Unable to eat. Complains of coffee-ground emesis and melena. Still has intermittent brbpr. Denies aspirin, NSAIDs.  In the ED, she had normal lipase, unremarkable LFTs. BUN and creatinine were normal. Hemoglobin down to 6.3 from 10.3 one month ago. MCV low at 74.8, platelets 481,000. She was heme positive. Transfusion is pending due to positive antibody screen.  Recent history significant for an admission in September at 9. Similar symptoms. EGD by Dr. Dorena Cookey showed large hiatal hernia, LA grade a reflux esophagitis, gastritis. CT showed moderate hiatal hernia, scattered prominent nodes around the hiatal hernia with questionable mild gastric and esophageal varices which were not seen at time of EGD. Hemoglobin was 6 at that time required 2 units of blood. She is also been to the ER at [facility] health since that hospitalization and they were making arrangements for her to follow-up with Dr. Carolynn Sayers regarding her recurrent hiatal hernia. When I saw her back in the office in October she was complaining of rectal bleeding and diarrhea. She had IDA felt to be multifactorial also in part due to heavy menses. However the last 2 months her menses have been light. We scheduled her for colonoscopy but she developed a fever during her prep and  colonoscopy was canceled. At [facility] she was H pylori antibody negative in September. She has subsequently seen Dr. Carolynn Sayers 08/05/2016 who confirmed images via CT were consistent with recurrent paraesophageal hernia. Referral has been made to Dr. Armandina Stammer to discuss thoracic approach for hernia repair, appointment scheduled for 10/02/2016.    Prior to Admission medications   Medication Sig Start Date End Date Taking? Authorizing Provider  iron polysaccharides (NIFEREX) 150 MG capsule Take 1 capsule (150 mg total) by mouth 2 (two) times daily. 06/04/16  Yes Vassie Loll, MD  ondansetron (ZOFRAN ODT) 8 MG disintegrating tablet Take 1 tablet (8 mg total) by mouth every 8 (eight) hours as needed for nausea or vomiting. 06/04/16  Yes Vassie Loll, MD  pantoprazole (PROTONIX) 40 MG tablet Take 1 tablet (40 mg total) by mouth 2 (two) times daily. 06/04/16  Yes Vassie Loll, MD  acetaminophen (TYLENOL) 500 MG tablet Take 1,000 mg by mouth every 6 (six) hours as needed for mild pain.    Historical Provider, MD  albuterol (PROVENTIL HFA;VENTOLIN HFA) 108 (90 Base) MCG/ACT inhaler Inhale 2 puffs into the lungs every 6 (six) hours as needed for wheezing or shortness of breath. 08/21/16   Evon Slack, PA-C  metFORMIN (GLUCOPHAGE) 500 MG tablet Take 500 mg by mouth daily as needed (Only takes if blood sugar becomes high).    Historical Provider, MD  promethazine (PHENERGAN) 25 MG tablet Take 25 mg by mouth every 8 (eight) hours as needed for nausea or vomiting.    Historical Provider, MD  sucralfate (CARAFATE) 1 g tablet Take 1 tablet (1 g total) by mouth 4 (four)  times daily -  with meals and at bedtime. 02/10/16   Dione Boozeavid Glick, MD    Current Facility-Administered Medications  Medication Dose Route Frequency Provider Last Rate Last Dose  . 0.9 %  sodium chloride infusion   Intravenous Once Gerhard Munchobert Lockwood, MD      . acetaminophen (TYLENOL) tablet 650 mg  650 mg Oral Q6H PRN Jonah BlueJennifer Yates, MD       Or  .  acetaminophen (TYLENOL) suppository 650 mg  650 mg Rectal Q6H PRN Jonah BlueJennifer Yates, MD      . insulin aspart (novoLOG) injection 0-15 Units  0-15 Units Subcutaneous TID WC Jonah BlueJennifer Yates, MD      . lactated ringers infusion   Intravenous Continuous Jonah BlueJennifer Yates, MD 100 mL/hr at 09/03/16 0635 100 mL/hr at 09/03/16 0635  . morphine 2 MG/ML injection 2 mg  2 mg Intravenous Q2H PRN Jonah BlueJennifer Yates, MD   2 mg at 09/03/16 0530  . ondansetron (ZOFRAN) tablet 4 mg  4 mg Oral Q6H PRN Jonah BlueJennifer Yates, MD       Or  . ondansetron [facility](ZOFRAN) injection 4 mg  4 mg Intravenous Q6H PRN Jonah BlueJennifer Yates, MD      . pantoprazole (PROTONIX) 80 mg in sodium chloride 0.9 % 250 mL (0.32 mg/mL) infusion  8 mg/hr Intravenous Continuous Gerhard Munchobert Lockwood, MD 25 mL/hr at 09/03/16 0635 8 mg/hr at 09/03/16 0635  . sucralfate (CARAFATE) tablet 1 g  1 g Oral Q6H Jonah BlueJennifer Yates, MD   1 g at 09/03/16 0630    Allergies as of 09/02/2016 - Review Complete 09/02/2016  Allergen Reaction Noted  . Lidocaine viscous Other (See Comments) 06/01/2016  . Penicillins Itching and Rash 07/17/2011    Past Medical History:  Diagnosis Date  . Adenotonsillar hypertrophy 03/2012   snores during sleep; denies apnea; states occ. wakes up coughing  . Anemia   . Anxiety   . Bipolar 1 disorder (HCC)   . 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 Depression   . Diabetes mellitus without complication (HCC)   . GERD (gastroesophageal reflux disease)   . GI bleed   . Heart failure (HCC)   . Hypercholesterolemia   . Migraines   . Obesity   . Pneumonia   . Wears dentures    upper denture    Past Surgical History:  Procedure Laterality Date  . APPENDECTOMY    . BIOPSY  02/14/2016   Procedure: BIOPSY;  Surgeon: West BaliSandi L Fields, MD;  [facility]: [facility];  Service: Endoscopy;;  gastric biopsies  . CESAREAN SECTION  08/31/2001   total of  3  . CHOLECYSTECTOMY    . DILATION AND EVACUATION   04/09/2000  . ESOPHAGOGASTRODUODENOSCOPY (EGD) WITH PROPOFOL N/A 02/14/2016   Dr. Darrick Pennafields: Large hiatal hernia, nonbleeding cratered gastric ulcer. Multiple nonbleeding erosions in the gastric fundus. Diffuse moderate inflammation, erosions, erythema of the gastric antrum. All biopsies benign with no evidence of H. pylori.  . ESOPHAGOGASTRODUODENOSCOPY (EGD) WITH PROPOFOL N/A 06/02/2016   Procedure: ESOPHAGOGASTRODUODENOSCOPY (EGD) WITH PROPOFOL;  Surgeon: Dorena CookeyJohn Hayes, MD;  [facility];  Service: Endoscopy;  Laterality: N/A;  . HIATAL HERNIA REPAIR  10/2015   planning for repeat surgery in 1/18  . open paraesophageal hernia repair with gastroexy  09/2015   Dr. Francee Gentilearl Wescott  . TONSILLECTOMY     adenoidectomy  . TONSILLECTOMY AND ADENOIDECTOMY  04/12/2012   Procedure: TONSILLECTOMY AND ADENOIDECTOMY;  Surgeon:  Darletta MollSui W Teoh, MD;  Location: [facility];  Service: ENT;  Laterality: Bilateral;  . TUBAL LIGATION  08/31/2001    Family History  Problem Relation Age of Onset  . Cancer Other   . Hypertension Mother   . Heart disease Mother   . Cancer Mother   . Cancer Maternal Aunt     not sure primary, in kidney and colon  . Cancer Maternal Uncle     lung cancer   . Stroke Maternal Grandfather     Social History   Social History  . Marital status: Divorced    Spouse name: N/A  . Number of children: 3  . Years of education: N/A   Occupational History  . unemployed    Social History Main Topics  . Smoking status: Current Some Day Smoker    Packs/day: 0.33    Years: 18.00    Types: E-cigarettes  . Smokeless tobacco: Never Used  . Alcohol use No  . Drug use:     Types: Marijuana     Comment: history of cocaine in 2002; "hit some weed over Thanksgiving"  . Sexual activity: Yes    Birth control/ protection: Surgical   Other Topics Concern  . Not on file   Social History Narrative  . No narrative on file     ROS:  General: Negative for anorexia, weight loss,  fever, chills. + fatigue, weakness. Positive lightheadedness Eyes: Negative for vision changes.  ENT: Negative for hoarseness, difficulty swallowing , nasal congestion. CV: Negative for chest pain, angina, palpitations, dyspnea on exertion, peripheral edema.  Respiratory: Negative for dyspnea at rest, dyspnea on exertion, cough, sputum, wheezing.  GI: See history of present illness. GU:  Negative for dysuria, hematuria, urinary incontinence, urinary frequency, nocturnal urination.  MS: Negative for joint pain, low back pain.  Derm: Negative for rash or itching.  Neuro: Negative for weakness, abnormal sensation, seizure, frequent headaches, memory loss, confusion.  Psych: Negative for anxiety, depression, suicidal ideation, hallucinations.  Endo: Negative for unusual weight change.  Heme: Negative for bruising or bleeding. Allergy: Negative for rash or hives.       Physical Examination: Vital signs in last 24 hours: Temp:  [98.1 F (36.7 C)-98.9 F (37.2 C)] 98.5 F (36.9 C) (12/07 [id number]) Pulse Rate:  [60-89] 66 (12/07 0637) Resp:  [16-20] 16 (12/07 0637) BP: (100-137)/(52-89) 115/68 (12/07 0637) SpO2:  [96 %-100 %] 100 % (12/07 19140637) Weight:  [196 lb (88.9 kg)] 196 lb (88.9 kg) (12/06 1353) Last BM Date: 09/02/16  General: Well-nourished, well-developed in no acute distress.  Head: Normocephalic, atraumatic.   Eyes: Conjunctiva pink, no icterus. Mouth: Oropharyngeal mucosa moist and pink , no lesions erythema or exudate. Neck: Supple without thyromegaly, masses, or lymphadenopathy.  Lungs: Clear to auscultation bilaterally.  Heart: Regular rate and rhythm, no murmurs rubs or gallops.  Abdomen: Bowel sounds are normal, moderate epigastric tenderness, nondistended, no hepatosplenomegaly or masses, no abdominal bruits or    hernia , no rebound or guarding.   Rectal: Not performed Extremities: No lower extremity edema, clubbing, deformity.  Neuro: Alert and oriented x 4 , grossly  normal neurologically.  Skin: Warm and dry, no rash or jaundice.   Psych: Alert and cooperative, normal mood and affect.        Intake/Output from previous day: 12/06 0701 - 12/07 0700 In: 654.6 [I.V.:269.6; Blood:335; IV Piggyback:50] Out: -  Intake/Output this shift: No intake/output data recorded.  Lab Results: CBC  Recent Labs  09/02/16 1359  WBC 8.5  HGB 6.3*  HCT 22.8*  MCV 74.8*  PLT 481*   BMET  Recent Labs  09/02/16 1359  NA 135  K 3.8  CL 105  CO2 25  GLUCOSE 128*  BUN 9  CREATININE 0.78  CALCIUM 8.8*   LFT  Recent Labs  09/02/16 1359  BILITOT 0.1*  ALKPHOS 78  AST 12*  ALT 11*  PROT 6.6  ALBUMIN 3.1*    Lipase  Recent Labs  09/02/16 1359  LIPASE 30    PT/INR No results for input(s): LABPROT, INR in the last 72 hours.    Imaging Studies: Dg Chest 2 View  Result Date: 08/21/2016 CLINICAL DATA:  Chest congestion.  Nasal congestion.  Fever EXAM: CHEST  2 VIEW COMPARISON:  01/24/2016. FINDINGS: Mediastinum hilar structures are unremarkable. Heart size normal. Diffuse bilateral pulmonary infiltrates are present. Follow-up chest x-rays recommended demonstrate clearing. No pleural effusion or pneumothorax. Sliding hiatal hernia. No acute bony abnormality . IMPRESSION: Diffuse bilateral pulmonary infiltrates consistent with pneumonia. Electronically Signed   By: Maisie Fus  Register   On: 08/21/2016 16:55  [4 week]   Impression: 40 year old female with history of unsuccessful paraesophageal hernia repair earlier this year who has had ongoing epigastric pain, melena, coffee-ground emesis, transfusion dependent microcytic anemia. Presents back with acute on chronic symptoms. Hemoglobin and 6 range, symptomatic. 2 week history of worsening epigastric pain, 4 days of intractable vomiting, melena and coffee-ground emesis. Currently undergoing second unit of packed red blood cells, delayed due to positive antibody screen. Last endoscopy was September by  Dr. Dorena Cookey during hospitalization at [facility]. Recommend repeat endoscopy at this time with possible therapeutic measures for upper GI bleed. Patient still requires a colonoscopy at some point in the near future however at this time she is unable to undergo bowel prep due to vomiting.  Plan: 1. EGD with deep sedation in the OR.  I have discussed the risks, alternatives, benefits with regards to but not limited to the risk of reaction to medication, bleeding, infection, perforation and the patient is agreeable to proceed. Written consent to be obtained. 2. PPI infusion as ordered.  3. Colonoscopy at later date for rectal bleeding and microcytic anemia.   We would like to thank you for the opportunity to participate in the care of Shelby Hatfield.  Leanna Battles. Dixon Boos [facility] [phone] 12/7/20178:59 AM     LOS: 1 day

## 2016-09-04 DIAGNOSIS — D62 Acute posthemorrhagic anemia: Secondary | ICD-10-CM

## 2016-09-04 DIAGNOSIS — K259 Gastric ulcer, unspecified as acute or chronic, without hemorrhage or perforation: Secondary | ICD-10-CM

## 2016-09-04 DIAGNOSIS — K209 Esophagitis, unspecified without bleeding: Secondary | ICD-10-CM

## 2016-09-04 DIAGNOSIS — K922 Gastrointestinal hemorrhage, unspecified: Secondary | ICD-10-CM

## 2016-09-04 LAB — CBC
HCT: 29.5 % — ABNORMAL LOW (ref 36.0–46.0)
HEMATOCRIT: 27.6 % — AB (ref 36.0–46.0)
HEMOGLOBIN: 8.2 g/dL — AB (ref 12.0–15.0)
Hemoglobin: 8.7 g/dL — ABNORMAL LOW (ref 12.0–15.0)
MCH: 22.6 pg — ABNORMAL LOW (ref 26.0–34.0)
MCH: 22.7 pg — ABNORMAL LOW (ref 26.0–34.0)
MCHC: 29.5 g/dL — ABNORMAL LOW (ref 30.0–36.0)
MCHC: 29.7 g/dL — ABNORMAL LOW (ref 30.0–36.0)
MCV: 76.6 fL — ABNORMAL LOW (ref 78.0–100.0)
MCV: 76.2 fL — ABNORMAL LOW (ref 78.0–100.0)
PLATELETS: 416 10*3/uL — AB (ref 150–400)
Platelets: 284 10*3/uL (ref 150–400)
RBC: 3.85 MIL/uL — AB (ref 3.87–5.11)
RBC: 3.62 MIL/uL — ABNORMAL LOW (ref 3.87–5.11)
RDW: 18.6 % — ABNORMAL HIGH (ref 11.5–15.5)
RDW: 18.9 % — ABNORMAL HIGH (ref 11.5–15.5)
WBC: 7.1 10*3/uL (ref 4.0–10.5)
WBC: 7.9 10*3/uL (ref 4.0–10.5)

## 2016-09-04 LAB — GLUCOSE, CAPILLARY
GLUCOSE-CAPILLARY: 107 mg/dL — AB (ref 65–99)
GLUCOSE-CAPILLARY: 98 mg/dL (ref 65–99)
Glucose-Capillary: 107 mg/dL — ABNORMAL HIGH (ref 65–99)
Glucose-Capillary: 98 mg/dL (ref 65–99)

## 2016-09-04 MED ORDER — INSULIN ASPART 100 UNIT/ML ~~LOC~~ SOLN: 0.0000 [IU] | Freq: Three times a day (TID) | SUBCUTANEOUS | 11 refills | Status: DC

## 2016-09-04 MED ORDER — SODIUM CHLORIDE 0.9 % IV SOLN: 8.0000 mg/h | INTRAVENOUS | Status: DC

## 2016-09-04 MED ORDER — ONDANSETRON HCL 4 MG/2ML IJ SOLN: 4.0000 mg | Freq: Four times a day (QID) | INTRAMUSCULAR | 0 refills | Status: DC | PRN

## 2016-09-04 MED ORDER — SUCRALFATE 1 G PO TABS: 1.0000 g | ORAL_TABLET | Freq: Four times a day (QID) | ORAL | Status: DC

## 2016-09-04 NOTE — Discharge Summary (Signed)
Physician Discharge Summary  Shelby Hatfield ZOX:[id number] DOB: September 29, 1975 DOA: 09/02/2016  PCP: No PCP Per Patient  Admit date: 09/02/2016 Discharge date: 09/04/2016  Time spent: > 35  minutes  Pt [facility] France[facility]arl [facility]   Discharge Diagnoses:  Principal Problem:   Gastrointestinal hemorrhage Active Problems:   Diabetes mellitus type 2 in obese (HCC)   Abdominal pain, chronic, epigastric   Acute blood loss anemia   Protein-calorie malnutrition, severe   Discharge Condition: guarded  Diet: clear liquid    Filed Weights   09/02/16 1353 09/03/16 1104  Weight: 88.9 kg (196 lb) 88.9 kg (196 lb)    History of present illness:  40 y.o. 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                           - Large hiatal hernia. Large gastric ulcer                            straddling the diaphragmatic hiatus. Could be a                            Sheria LangCameron- type lesion. I cannot exclude the                            possibility of intermittent torsion (please see full report)  Hospital Course:  Profound anemia and BRBPR - secondary to findings listed above. [facility] and [facility]. Dr Francee Gentilearl Wescott has accepted. I will tranfer.  Procedures:  EGD  Consultations:  GI: Dr. Lorrin Goodell. Rourk  Discharge Exam: Vitals:   09/03/16 2125 09/04/16 0535  BP: (!) 116/58 108/62  Pulse: 72 70  Resp: 20 20  Temp: 99 F (37.2 C) 98.2 F (36.8 C)    General: Pt in nad, alert and awake Cardiovascular: rrr, no rubs Respiratory: no increased wob, no wheezes  Discharge Instructions   Discharge Instructions    Diet - low sodium heart healthy    Complete by:  As directed    Increase  activity slowly    Complete by:  As directed      Current Discharge Medication List    START taking these medications   Details  insulin aspart (NOVOLOG) 100 UNIT/ML injection Inject 0-15 Units into the skin 3 (three) times daily with meals. Qty: 10 mL, Refills: 11    ondansetron (ZOFRAN) 4 MG/2ML SOLN injection Inject 2 mLs (4 mg total) into the vein every 6 (six) hours as needed for nausea. Qty: 2 mL, Refills: 0    pantoprazole 80 mg in sodium chloride 0.9 % 250 mL Inject 8 mg/hr into the vein continuous.      CONTINUE these medications which have CHANGED   Details  sucralfate (CARAFATE) 1 g tablet Take 1 tablet (1 g total) by mouth every 6 (six) hours.      CONTINUE these medications which have NOT CHANGED   Details  albuterol (PROVENTIL HFA;VENTOLIN HFA) 108 (90 Base) MCG/ACT inhaler Inhale 2 puffs into the lungs every 6 (six) hours as needed for wheezing  or shortness of breath. Qty: 1 Inhaler, Refills: 2      STOP taking these medications     iron polysaccharides (NIFEREX) 150 MG capsule      ondansetron (ZOFRAN ODT) 8 MG disintegrating tablet      pantoprazole (PROTONIX) 40 MG tablet      acetaminophen (TYLENOL) 500 MG tablet      metFORMIN (GLUCOPHAGE) 500 MG tablet      promethazine (PHENERGAN) 25 MG tablet        Allergies  Allergen Reactions  . Lidocaine Viscous Other (See Comments)    Created BLISTERS in the mouth  . Penicillins Itching and Rash    Has patient had a PCN reaction causing immediate rash, facial/tongue/throat swelling, SOB or lightheadedness with hypotension: Yes Has patient had a PCN reaction causing severe rash involving mucus membranes or skin necrosis: No Has patient had a PCN reaction that required hospitalization No Has patient had a PCN reaction occurring within the last 10 years: No If all of the above answers are "NO", then may proceed with Cephalosporin use.       The results of significant diagnostics from this  hospitalization (including imaging, microbiology, ancillary and laboratory) are listed below for reference.    Significant Diagnostic Studies: Dg Chest 2 View  Result Date: 08/21/2016 CLINICAL DATA:  Chest congestion.  Nasal congestion.  Fever EXAM: CHEST  2 VIEW COMPARISON:  01/24/2016. FINDINGS: Mediastinum hilar structures are unremarkable. Heart size normal. Diffuse bilateral pulmonary infiltrates are present. Follow-up chest x-rays recommended demonstrate clearing. No pleural effusion or pneumothorax. Sliding hiatal hernia. No acute bony abnormality . IMPRESSION: Diffuse bilateral pulmonary infiltrates consistent with pneumonia. Electronically Signed   By: Shelby Fushomas  Register   On: 08/21/2016 16:55    Microbiology: No results found for this or any previous visit (from the past 240 hour(s)).   Labs: Basic Metabolic Panel:  Recent Labs Lab 09/02/16 1359 09/03/16 1133 09/03/16 1410  NA 135 141 138  K 3.8 4.2 4.2  CL 105  --  108  CO2 25  --  25  GLUCOSE 128* 87 123*  BUN 9  --  8  CREATININE 0.78  --  0.82  CALCIUM 8.8*  --  8.8*   Liver Function Tests:  Recent Labs Lab 09/02/16 1359  AST 12*  ALT 11*  ALKPHOS 78  BILITOT 0.1*  PROT 6.6  ALBUMIN 3.1*    Recent Labs Lab 09/02/16 1359  LIPASE 30   No results for input(s): AMMONIA in the last 168 hours. CBC:  Recent Labs Lab 09/02/16 1359 09/03/16 1133 09/03/16 1410 09/03/16 2250 09/04/16 0635  WBC 8.5  --  8.1 7.8 7.1  HGB 6.3* 8.2* 8.6* 8.3* 8.7*  HCT 22.8* 24.0* 28.8* 26.9* 29.5*  MCV 74.8*  --  76.4* 75.4* 76.6*  PLT 481*  --  424* 372 416*   Cardiac Enzymes: No results for input(s): CKTOTAL, CKMB, CKMBINDEX, TROPONINI in the last 168 hours. BNP: BNP (last 3 results) No results for input(s): BNP in the last 8760 hours.  ProBNP (last 3 results) No results for input(s): PROBNP in the last 8760 hours.  CBG:  Recent Labs Lab 09/03/16 1230 09/03/16 1624 09/03/16 2127 09/04/16 0729  09/04/16 1149  GLUCAP 113* 111* 131* 107* 107*       Signed:  Penny Hatfield, Shelby Arya MD.  [facility] 09/04/2016, 12:32 PM

## 2016-09-04 NOTE — Progress Notes (Signed)
Brought image CD from EGD to the floor secretary to be included in the transfer package for the patient. Informed the RN of where the image CD is.

## 2016-09-04 NOTE — Progress Notes (Signed)
Subjective: Continued pain despite Morphine every 2 hours. Morpine will help briefly but then wear off. Continued nausea, no vomiting. Pain radiates up into her chest and throuhgh to her back. No significant worsening in her pain. Did have a bowel movement yesterday which was dark and noted some minimal red blood as well. Is having some dizziness associated with "shaking" which improves if she closes her eyes and takes slow breaths. No other upper or lower GI symptoms.  Objective: Vital signs in last 24 hours: Temp:  [97.9 F (36.6 C)-99 F (37.2 C)] 98.2 F (36.8 C) (12/08 0535) Pulse Rate:  [59-72] 70 (12/08 0535) Resp:  [17-20] 20 (12/08 0535) BP: (102-116)/(54-64) 108/62 (12/08 0535) SpO2:  [95 %-100 %] 100 % (12/08 0535) Weight:  [196 lb (88.9 kg)] 196 lb (88.9 kg) (12/07 1104) Last BM Date: 09/03/16 General:   Alert and oriented, pleasant. No acute distress. Eyes:  No icterus, sclera clear. Conjuctiva pink.  Heart:  S1, S2 present, no murmurs noted.  Lungs: Clear to auscultation bilaterally, without wheezing, rales, or rhonchi.  Abdomen:  Bowel sounds present, soft, non-distended. Epigastric TTP noted. No HSM or hernias noted. No rebound or guarding.  Msk:  Symmetrical without gross deformities. Pulses:  Normal bilateral pedal pulses noted. Extremities:  Without clubbing or edema. Neurologic:  Alert and  oriented x4 Skin:  Warm and dry, intact without significant lesions.  Psych:  Alert and cooperative. Normal mood and affect. Appears anxious.  Intake/Output from previous day: 12/07 0701 - 12/08 0700 In: 1228.3 [I.V.:848.3; Blood:380] Out: 0  Intake/Output this shift: No intake/output data recorded.  Lab Results:  Recent Labs  09/03/16 1410 09/03/16 2250 09/04/16 0635  WBC 8.1 7.8 7.1  HGB 8.6* 8.3* 8.7*  HCT 28.8* 26.9* 29.5*  PLT 424* 372 416*   BMET  Recent Labs  09/02/16 1359 09/03/16 1133 09/03/16 1410  NA 135 141 138  K 3.8 4.2 4.2  CL 105  --   108  CO2 25  --  25  GLUCOSE 128* 87 123*  BUN 9  --  8  CREATININE 0.78  --  0.82  CALCIUM 8.8*  --  8.8*   LFT  Recent Labs  09/02/16 1359  PROT 6.6  ALBUMIN 3.1*  AST 12*  ALT 11*  ALKPHOS 78  BILITOT 0.1*   PT/INR No results for input(s): LABPROT, INR in the last 72 hours. Hepatitis Panel No results for input(s): HEPBSAG, HCVAB, HEPAIGM, HEPBIGM in the last 72 hours.   Studies/Results: No results found.  Assessment: 40 year old female with history of unsuccessful paraesophageal hernia repair earlier this year who has had ongoing epigastric pain, melena, coffee-ground emesis, transfusion dependent microcytic anemia. Presents back with acute on chronic symptoms. Hemoglobin and 6 range, symptomatic. 2 week history of worsening epigastric pain, 4 days of intractable vomiting, melena and coffee-ground emesis. Has received two units of packed red blood cells, delayed due to positive antibody screen. Last endoscopy was September by Dr. Dorena CookeyJohn Hayes during [facility]. Recommend repeat endoscopy yesterday with possible therapeutic measures for upper GI bleed.   Patient still requires a colonoscopy at some point in the near future however at this time she is unable to undergo bowel prep due to vomiting.  EGD was completed yesterday and found LA grade D esophagitis at 27-30 cm from the incisors, no Barrett's noted. GE junction at 30 cm and diaphragmatic hiatus at 40 cm with significant amount of retained food debris in the hiatal hernia  sac. A rather large 1.5 cm x 3 cm gastric ulcer was found straddling the diaphragmatic hiatus, no bleeding stigmata noted. J shaped gastric cavity, patent pylorus, normal-appearing duodenum. The endoscopist overall impression of retained gastric contents in the setting of diabetes on multiple medications with last food intake 36 hours prior may be a manifestation of delayed gastric emptying. However, given her known history of paraesophageal  hernia it was recommended the [facility] transferred [facility] to have her hernia repaired expeditiously rather than at scheduled appointment in 1 month. This recommendation is to prevent potential catastrophic intra-abdominal sequela.  Labs today shows stable/improved hemoglobin at 8.7 compared to 8.3 yesterday, remains hypochromic microcytic. Normal white blood cell count. Overall, the patient remains symptomatic. No improvement in her symptoms, but also no acute worsening. I believe her dizziness and shakes are likely related to anxiety surrounding her situation. She stated she is scared. I discussed the situation with the hospitalist, Dr. Cena BentonVega, who states he is available to help with the paperwork and ground work for transfer if we do decide to [facility]. He is requesting we find accepting physician related to the GI issues necessitating transfer.   Plan: 1. Discussed the situation with Dr. Darrick Pennafields, considering [facility] for [facility]ited evaluation and treatment by cardiothoracic surgery. 2. Continue current medications. 3. Page urgently if any acute changes. 4. Notify of any noted GI bleed 5. Monitor H&H closely, transfuse as necessary 6. Further recommendations to follow  Thank you for allowing us to participate in the care of Roselind S Scherer  Wynne DustEric Madina Galati, DNP, AGNP-C Adult & Gerontological Nurse Practitioner [facility]     LOS: 2 days    09/04/2016, 8:41 AM

## 2016-09-05 LAB — GLUCOSE, CAPILLARY: Glucose-Capillary: 119 mg/dL — ABNORMAL HIGH (ref 65–99)

## 2016-09-05 NOTE — Progress Notes (Signed)
Pt transported via care link [facility].  Report called to Thornell SartoriusKaren Luper RN

## 2016-09-06 LAB — TYPE AND SCREEN
ABO/RH(D): O POS
Antibody Screen: POSITIVE
DAT, IgG: POSITIVE
DONOR AG TYPE: NEGATIVE
DONOR AG TYPE: NEGATIVE
Donor AG Type: NEGATIVE
Donor AG Type: NEGATIVE
PT AG Type: NEGATIVE
UNIT DIVISION: 0
UNIT DIVISION: 0
UNIT DIVISION: 0
Unit division: 0
Unit division: 0
Unit division: 0

## 2016-09-10 ENCOUNTER — Encounter (HOSPITAL_COMMUNITY): Payer: Self-pay | Admitting: Internal Medicine

## 2016-10-08 ENCOUNTER — Encounter (HOSPITAL_COMMUNITY): Payer: Self-pay | Admitting: Emergency Medicine

## 2016-10-08 DIAGNOSIS — J449 Chronic obstructive pulmonary disease, unspecified: Secondary | ICD-10-CM | POA: Diagnosis not present

## 2016-10-08 DIAGNOSIS — E119 Type 2 diabetes mellitus without complications: Secondary | ICD-10-CM | POA: Insufficient documentation

## 2016-10-08 DIAGNOSIS — K920 Hematemesis: Secondary | ICD-10-CM | POA: Insufficient documentation

## 2016-10-08 DIAGNOSIS — F17290 Nicotine dependence, other tobacco product, uncomplicated: Secondary | ICD-10-CM | POA: Diagnosis not present

## 2016-10-08 DIAGNOSIS — Z79899 Other long term (current) drug therapy: Secondary | ICD-10-CM | POA: Insufficient documentation

## 2016-10-08 DIAGNOSIS — Z794 Long term (current) use of insulin: Secondary | ICD-10-CM | POA: Diagnosis not present

## 2016-10-08 LAB — URINALYSIS, ROUTINE W REFLEX MICROSCOPIC
BILIRUBIN URINE: NEGATIVE
GLUCOSE, UA: NEGATIVE mg/dL
Hgb urine dipstick: NEGATIVE
KETONES UR: NEGATIVE mg/dL
NITRITE: NEGATIVE
PH: 6 (ref 5.0–8.0)
Protein, ur: NEGATIVE mg/dL
Specific Gravity, Urine: 1.001 — ABNORMAL LOW (ref 1.005–1.030)
Squamous Epithelial / LPF: NONE SEEN

## 2016-10-08 LAB — COMPREHENSIVE METABOLIC PANEL
ALBUMIN: 3.2 g/dL — AB (ref 3.5–5.0)
ALT: 15 U/L (ref 14–54)
ANION GAP: 9 (ref 5–15)
AST: 22 U/L (ref 15–41)
Alkaline Phosphatase: 77 U/L (ref 38–126)
BUN: 8 mg/dL (ref 6–20)
CALCIUM: 8.6 mg/dL — AB (ref 8.9–10.3)
CO2: 18 mmol/L — AB (ref 22–32)
Chloride: 114 mmol/L — ABNORMAL HIGH (ref 101–111)
Creatinine, Ser: 0.86 mg/dL (ref 0.44–1.00)
GFR calc Af Amer: >60 mL/min (ref >60)
GFR calc non Af Amer: >60 mL/min (ref >60)
GLUCOSE: 94 mg/dL (ref 65–99)
Potassium: 3.3 mmol/L — ABNORMAL LOW (ref 3.5–5.1)
SODIUM: 141 mmol/L (ref 135–145)
Total Bilirubin: 0.3 mg/dL (ref 0.3–1.2)
Total Protein: 6.7 g/dL (ref 6.5–8.1)

## 2016-10-08 LAB — CBC
HCT: 28.7 % — ABNORMAL LOW (ref 36.0–46.0)
HEMOGLOBIN: 8.1 g/dL — AB (ref 12.0–15.0)
MCH: 19.9 pg — AB (ref 26.0–34.0)
MCHC: 28.2 g/dL — AB (ref 30.0–36.0)
MCV: 70.5 fL — ABNORMAL LOW (ref 78.0–100.0)
Platelets: 327 10*3/uL (ref 150–400)
RBC: 4.07 MIL/uL (ref 3.87–5.11)
RDW: 19.7 % — AB (ref 11.5–15.5)
WBC: 8.4 10*3/uL (ref 4.0–10.5)

## 2016-10-08 LAB — LIPASE, BLOOD: LIPASE: 26 U/L (ref 11–51)

## 2016-10-08 LAB — I-STAT BETA HCG BLOOD, ED (MC, WL, AP ONLY): I-stat hCG, quantitative: <5 m[IU]/mL (ref <5)

## 2016-10-08 NOTE — ED Triage Notes (Signed)
Pt presents to ED for assessment for vomitng x 2 days.  Pt had hernia/cardiothoracic surgery x 1 month, and states she noted blood in her vomit.  Pt sts bright red blood noted with some foam.  Pt also c/o pain with bowel movements.  Pt states bowel movements dark.

## 2016-10-09 ENCOUNTER — Encounter (HOSPITAL_COMMUNITY): Payer: Self-pay | Admitting: Emergency Medicine

## 2016-10-09 ENCOUNTER — Emergency Department (HOSPITAL_COMMUNITY)
Admission: EM | Admit: 2016-10-09 | Discharge: 2016-10-09 | Disposition: A | Discharge status: Other Acute Inpatient Hospital | Payer: Medicaid Other | Attending: Emergency Medicine | Admitting: Emergency Medicine

## 2016-10-09 DIAGNOSIS — F17290 Nicotine dependence, other tobacco product, uncomplicated: Secondary | ICD-10-CM | POA: Diagnosis not present

## 2016-10-09 DIAGNOSIS — E119 Type 2 diabetes mellitus without complications: Secondary | ICD-10-CM | POA: Diagnosis not present

## 2016-10-09 DIAGNOSIS — Z794 Long term (current) use of insulin: Secondary | ICD-10-CM | POA: Diagnosis not present

## 2016-10-09 DIAGNOSIS — J449 Chronic obstructive pulmonary disease, unspecified: Secondary | ICD-10-CM | POA: Diagnosis not present

## 2016-10-09 DIAGNOSIS — Z79899 Other long term (current) drug therapy: Secondary | ICD-10-CM | POA: Diagnosis not present

## 2016-10-09 DIAGNOSIS — K920 Hematemesis: Secondary | ICD-10-CM | POA: Diagnosis present

## 2016-10-09 HISTORY — DX: Malingerer (conscious simulation): Z76.5

## 2016-10-09 LAB — POC OCCULT BLOOD, ED: Fecal Occult Bld: NEGATIVE

## 2016-10-09 MED ORDER — IOPAMIDOL (ISOVUE-300) INJECTION 61%
INTRAVENOUS | Status: AC
  Filled 2016-10-09: qty 100

## 2016-10-09 MED ORDER — ONDANSETRON HCL 4 MG/2ML IJ SOLN
4.0000 mg | Freq: Once | INTRAMUSCULAR | Status: AC
  Administered 2016-10-09: 4 mg via INTRAVENOUS
  Filled 2016-10-09: qty 2

## 2016-10-09 MED ORDER — MORPHINE SULFATE (PF) 4 MG/ML IV SOLN
4.0000 mg | Freq: Once | INTRAVENOUS | Status: AC
  Administered 2016-10-09: 4 mg via INTRAVENOUS
  Filled 2016-10-09: qty 1

## 2016-10-09 MED ORDER — SODIUM CHLORIDE 0.9 % IV SOLN
8.0000 mg/h | INTRAVENOUS | Status: DC
  Filled 2016-10-09: qty 80

## 2016-10-09 MED ORDER — IOPAMIDOL (ISOVUE-300) INJECTION 61%
INTRAVENOUS | Status: AC
  Filled 2016-10-09: qty 30

## 2016-10-09 MED ORDER — SODIUM CHLORIDE 0.9 % IV SOLN
80.0000 mg | Freq: Once | INTRAVENOUS | Status: DC
  Filled 2016-10-09: qty 80

## 2016-10-09 MED ORDER — PANTOPRAZOLE SODIUM 40 MG IV SOLR: 40.0000 mg | Freq: Two times a day (BID) | INTRAVENOUS | Status: DC

## 2016-10-09 NOTE — ED Provider Notes (Signed)
[facility]-EMERGENCY DEPT Provider Note   CSN: [id number] Arrival date & time: 10/08/16  2207     History   Chief Complaint Chief Complaint  Patient presents with  . Hematemesis    HPI Shelby Hatfield is a 41 y.o. female.  HPI   41 year old female with history of bipolar, COPD, GI bleed, GERD, diabetes, heart failure here complaining of vomiting blood. Patient states she has history of twisted gut, diaphragmatic hernia, and recently had two separate abdominal surgery, last January and another surgery by Dr. Mordecai Maes [facility]) a month ago. For the past 2 days she reportedly had multiple bouts of vomitus with frank red blood. States she vomits every 30-45 minutes throughout the day, with at least 2 cups 4 of blood each episode. She also noticed her stool is darker than usual. She endorses generalized fatigue, sleepiness, generalized body aches. She denies fever but endorsed chills. Denies chest pain, or shortness of breath. No hemoptysis. Does report pain in her abdomen and back. Pain is sharp, crampy and persistent. She is not on any blood thinner medication. Denies alcohol abuse.   Past Medical History:  Diagnosis Date  . Adenotonsillar hypertrophy 03/2012   snores during sleep; denies apnea; states occ. wakes up coughing  . Anemia   . Anxiety   . Bipolar 1 disorder (HCC)   . 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 Depression   . Diabetes mellitus without complication (HCC)   . GERD (gastroesophageal reflux disease)   . GI bleed   . Heart failure (HCC)   . Hypercholesterolemia   . Migraines   . Obesity   . Pneumonia   . Wears dentures    upper denture    Patient Active Problem List   Diagnosis Date Noted  . Acute esophagitis   . Cameron ulcer   . Gastrointestinal hemorrhage 09/02/2016  . IDA (iron deficiency anemia) 06/30/2016  . Rectal bleeding 06/30/2016  . Chronic diarrhea 06/30/2016  . Gastroesophageal  reflux disease with esophagitis   . Hiatal hernia   . Morbid obesity due to excess calories (HCC)   . Protein-calorie malnutrition, severe 06/02/2016  . Upper GI bleed 06/01/2016  . PUD (peptic ulcer disease) 06/01/2016  . Acute blood loss anemia 06/01/2016  . Acute GI bleeding 06/01/2016  . Abdominal pain, chronic, epigastric 02/12/2016  . 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 Hematemesis 02/12/2016  . Pulmonary nodules 02/12/2016  . Acute on chronic diastolic heart failure (HCC) 01/21/2015  . 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 Nausea with vomiting 01/20/2015  . 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 SOB (shortness of breath) 01/19/2015  . Lower leg edema 01/19/2015  . Diarrhea   . Diabetes mellitus type 2 in obese (HCC)   . COPD (chronic obstructive pulmonary disease) (HCC) 08/18/2014  . COPD exacerbation (HCC) 08/18/2014  . Chest tightness 08/18/2014  . Shortness of breath   . Opiate addiction (HCC) 05/22/2014  . 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 Opiate dependence, continuous (HCC) 04/20/2013  . Benzodiazepine dependence (HCC) 04/20/2013    Past Surgical History:  Procedure Laterality Date  . APPENDECTOMY    . BIOPSY  02/14/2016   Procedure: BIOPSY;  Surgeon: West Bali, MD;  Location: [facility];  Service: Endoscopy;;  gastric biopsies  . CESAREAN SECTION  08/31/2001   total of  3  . CHOLECYSTECTOMY    . DILATION AND EVACUATION  04/09/2000  . ESOPHAGEAL DILATION N/A 09/03/2016   Procedure: ESOPHAGEAL DILATION;  Surgeon: Corbin Ade, MD;  Location: [facility];  Service: Endoscopy;  Laterality: N/A;  . ESOPHAGOGASTRODUODENOSCOPY (EGD) WITH PROPOFOL N/A 02/14/2016   Dr. Darrick Penna: Large hiatal hernia, nonbleeding cratered gastric ulcer. Multiple nonbleeding erosions in the gastric fundus. Diffuse moderate inflammation, erosions, erythema of the gastric antrum. All biopsies benign with no evidence of H.  pylori.  . ESOPHAGOGASTRODUODENOSCOPY (EGD) WITH PROPOFOL N/A 06/02/2016   Procedure: ESOPHAGOGASTRODUODENOSCOPY (EGD) WITH PROPOFOL;  Surgeon: Dorena Cookey, MD;  Location[facility];  Service: Endoscopy;  Laterality: N/A;  . ESOPHAGOGASTRODUODENOSCOPY (EGD) WITH PROPOFOL N/A 09/03/2016   Procedure: ESOPHAGOGASTRODUODENOSCOPY (EGD) WITH PROPOFOL;  Surgeon: Corbin Ade, MD;  Location: [facility];  Service: Endoscopy;  Laterality: N/A;  . HIATAL HERNIA REPAIR  10/2015   planning for repeat surgery in 1/18  . open paraesophageal hernia repair with gastroexy  09/2015   Dr. Francee Gentile  . TONSILLECTOMY     adenoidectomy  . TONSILLECTOMY AND ADENOIDECTOMY  04/12/2012   Procedure: TONSILLECTOMY AND ADENOIDECTOMY;  Surgeon: Darletta Moll, MD;  Location: [facility];  Service: ENT;  Laterality: Bilateral;  . TUBAL LIGATION  08/31/2001    OB History    Gravida Para Term Preterm AB Living   8 3 3   5 3    SAB TAB Ectopic Multiple Live Births   5               Home Medications    Prior to Admission medications   Medication Sig Start Date End Date Taking? Authorizing Provider  albuterol (PROVENTIL HFA;VENTOLIN HFA) 108 (90 Base) MCG/ACT inhaler Inhale 2 puffs into the lungs every 6 (six) hours as needed for wheezing or shortness of breath. 08/21/16   Evon Slack, PA-C  insulin aspart (NOVOLOG) 100 UNIT/ML injection Inject 0-15 Units into the skin 3 (three) times daily with meals. 09/04/16   Penny Pia, MD  ondansetron (ZOFRAN) 4 MG/2ML SOLN injection Inject 2 mLs (4 mg total) into the vein every 6 (six) hours as needed for nausea. 09/04/16   Penny Pia, MD  pantoprazole 80 mg in sodium chloride 0.9 % 250 mL Inject 8 mg/hr into the vein continuous. 09/04/16   Penny Pia, MD  sucralfate (CARAFATE) 1 g tablet Take 1 tablet (1 g total) by mouth every 6 (six) hours. 09/04/16   Penny Pia, MD    Family History Family History  Problem Relation Age of Onset  . Cancer Other   .  Hypertension Mother   . Heart disease Mother   . Cancer Mother   . Cancer Maternal Aunt     not sure primary, in kidney and colon  . Cancer Maternal Uncle     lung cancer   . Stroke Maternal Grandfather     Social History Social History  Substance Use Topics  . Smoking status: Current Some Day Smoker    Packs/day: 0.33    Years: 18.00    Types: E-cigarettes  . Smokeless tobacco: Never Used  . Alcohol use No     Allergies   Lidocaine viscous and Penicillins   Review of Systems Review of Systems  All other systems reviewed and are negative.    Physical Exam Updated Vital Signs BP 102/62   Pulse 65   Temp 99 F (37.2 C) (Oral)   Resp 16   Ht 5\' 1"  (1.549 m)   Wt 87.1 kg   LMP 07/08/2016  SpO2 99%   BMI 36.28 kg/m   Physical Exam  Constitutional: She is oriented to person, place, and time. She appears well-developed and well-nourished. No distress.  Patient appears drowsy sleepy but easily arousable  HENT:  Head: Atraumatic.  Eyes: Conjunctivae are normal.  Neck: Neck supple.  Cardiovascular: Normal rate and regular rhythm.   Pulmonary/Chest: Effort normal and breath sounds normal.  Left posterior wall in intercostal surgical scar, well-appearing, no dehiscence or signs of infection.  Abdominal: Soft. She exhibits no distension. There is tenderness (Diffuse abdominal tenderness without hernia or overlying skin changes. Well appearing midline abdominal surgical scar.).  Genitourinary:  Genitourinary Comments: Chaperone present during exam. Normal rectal tone, normal color stool, no mass, no impaction.  Neurological: She is alert and oriented to person, place, and time.  Skin: No rash noted.  Psychiatric: She has a normal mood and affect.  Nursing note and vitals reviewed.    ED Treatments / Results  Labs (all labs ordered are listed, but only abnormal results are displayed) Labs Reviewed  COMPREHENSIVE METABOLIC PANEL - Abnormal; Notable for the  following:       Result Value   Potassium 3.3 (*)    Chloride 114 (*)    CO2 18 (*)    Calcium 8.6 (*)    Albumin 3.2 (*)    All other components within normal limits  CBC - Abnormal; Notable for the following:    Hemoglobin 8.1 (*)    HCT 28.7 (*)    MCV 70.5 (*)    MCH 19.9 (*)    MCHC 28.2 (*)    RDW 19.7 (*)    All other components within normal limits  URINALYSIS, ROUTINE W REFLEX MICROSCOPIC - Abnormal; Notable for the following:    Color, Urine STRAW (*)    Specific Gravity, Urine 1.001 (*)    Leukocytes, UA MODERATE (*)    Bacteria, UA RARE (*)    All other components within normal limits  LIPASE, BLOOD  POC OCCULT BLOOD, ED  I-STAT BETA HCG BLOOD, ED ([facility], [facility], [facility] ONLY)  TYPE AND SCREEN    EKG  EKG Interpretation None       Radiology No results found.  Procedures Procedures (including critical care time)  Medications Ordered in ED Medications  iopamidol (ISOVUE-300) 61 % injection (not administered)  iopamidol (ISOVUE-300) 61 % injection (not administered)  morphine 4 MG/ML injection 4 mg (not administered)  morphine 4 MG/ML injection 4 mg (4 mg Intravenous Given 10/09/16 0441)  ondansetron (ZOFRAN) injection 4 mg (4 mg Intravenous Given 10/09/16 0441)     Initial Impression / Assessment and Plan / ED Course  I have reviewed the triage vital signs and the nursing notes.  Pertinent labs & imaging results that were available during my care of the patient were reviewed by me and considered in my medical decision making (see chart for details).  Clinical Course     BP 105/68   Pulse 64   Temp 99 F (37.2 C) (Oral)   Resp 15   Ht 5\' 1"  (1.549 m)   Wt 87.1 kg   LMP 07/08/2016   SpO2 98%   BMI 36.28 kg/m    Final Clinical Impressions(s) / ED Diagnoses   Final diagnoses:  Hematemesis with nausea    New Prescriptions New Prescriptions   No medications on file   5:57 AM Patient with history of GI bleed, diffuse, here with complaints of  hematochezia. Her Hemoccult is negative. Her vital  signs are stable. Her hemoglobin is 8.1 which is at her baseline. Will obtain abdominal and pelvis CT scan for further evaluation.  6:48 AM Since pt has had a recent paraesophageal repair with thoracotomy 4 weeks ago, and now presenting with hematemesis I have reached out to [facility] Dr. Gemma Payor who took care of pt recently.  He request pt to be transfer ER to ER, accepting ER provider is Dr. Corine Shelter, for further evaluation and management of her condition.  She will likely benefit from an abd/pelvis CT as well as an EGD.    Pt's vital sign is stable.  She is aware of plan and agrees with plan.  Care discussed with Dr. Patria Mane.     Fayrene Helper, PA-C 10/09/16 0725    Azalia Bilis, MD 10/09/16 [phone]

## 2016-10-09 NOTE — ED Notes (Signed)
Pt requesting more pain medicine, Laveda Normanran PA aware

## 2016-10-12 LAB — TYPE AND SCREEN
BLOOD PRODUCT EXPIRATION DATE: 201801272359
Blood Product Expiration Date: 201801282359
UNIT TYPE AND RH: 5100
Unit Type and Rh: 5100

## 2016-10-30 ENCOUNTER — Emergency Department (HOSPITAL_COMMUNITY)
Admission: EM | Admit: 2016-10-30 | Discharge: 2016-10-30 | Disposition: A | Discharge status: Home / Self Care | Payer: Medicaid Other | Attending: Emergency Medicine | Admitting: Emergency Medicine

## 2016-10-30 ENCOUNTER — Encounter (HOSPITAL_COMMUNITY): Payer: Self-pay | Admitting: Emergency Medicine

## 2016-10-30 ENCOUNTER — Emergency Department (HOSPITAL_COMMUNITY): Payer: Medicaid Other

## 2016-10-30 DIAGNOSIS — E119 Type 2 diabetes mellitus without complications: Secondary | ICD-10-CM | POA: Insufficient documentation

## 2016-10-30 DIAGNOSIS — I5033 Acute on chronic diastolic (congestive) heart failure: Secondary | ICD-10-CM | POA: Diagnosis not present

## 2016-10-30 DIAGNOSIS — J449 Chronic obstructive pulmonary disease, unspecified: Secondary | ICD-10-CM | POA: Insufficient documentation

## 2016-10-30 DIAGNOSIS — F17210 Nicotine dependence, cigarettes, uncomplicated: Secondary | ICD-10-CM | POA: Diagnosis not present

## 2016-10-30 DIAGNOSIS — R0602 Shortness of breath: Secondary | ICD-10-CM | POA: Diagnosis present

## 2016-10-30 DIAGNOSIS — Z794 Long term (current) use of insulin: Secondary | ICD-10-CM | POA: Insufficient documentation

## 2016-10-30 DIAGNOSIS — J189 Pneumonia, unspecified organism: Secondary | ICD-10-CM

## 2016-10-30 DIAGNOSIS — Z79899 Other long term (current) drug therapy: Secondary | ICD-10-CM | POA: Insufficient documentation

## 2016-10-30 DIAGNOSIS — R6889 Other general symptoms and signs: Secondary | ICD-10-CM

## 2016-10-30 DIAGNOSIS — J181 Lobar pneumonia, unspecified organism: Secondary | ICD-10-CM | POA: Diagnosis not present

## 2016-10-30 LAB — CBC WITH DIFFERENTIAL/PLATELET
BASOS ABS: 0 10*3/uL (ref 0.0–0.1)
BASOS PCT: 0 %
Eosinophils Absolute: 0 10*3/uL (ref 0.0–0.7)
Eosinophils Relative: 0 %
HEMATOCRIT: 26.1 % — AB (ref 36.0–46.0)
Hemoglobin: 7.6 g/dL — ABNORMAL LOW (ref 12.0–15.0)
LYMPHS ABS: 1.8 10*3/uL (ref 0.7–4.0)
Lymphocytes Relative: 14 %
MCH: 19.2 pg — AB (ref 26.0–34.0)
MCHC: 29.1 g/dL — AB (ref 30.0–36.0)
MCV: 66.1 fL — AB (ref 78.0–100.0)
MONOS PCT: 11 %
Monocytes Absolute: 1.4 10*3/uL — ABNORMAL HIGH (ref 0.1–1.0)
NEUTROS ABS: 9.3 10*3/uL — AB (ref 1.7–7.7)
Neutrophils Relative %: 75 %
Platelets: 351 10*3/uL (ref 150–400)
RBC: 3.95 MIL/uL (ref 3.87–5.11)
RDW: 20 % — AB (ref 11.5–15.5)
WBC: 12.5 10*3/uL — ABNORMAL HIGH (ref 4.0–10.5)

## 2016-10-30 LAB — URINALYSIS, ROUTINE W REFLEX MICROSCOPIC
Bilirubin Urine: NEGATIVE
Glucose, UA: NEGATIVE mg/dL
Ketones, ur: NEGATIVE mg/dL
Nitrite: NEGATIVE
PH: 7 (ref 5.0–8.0)
Protein, ur: NEGATIVE mg/dL
SPECIFIC GRAVITY, URINE: 1.006 (ref 1.005–1.030)

## 2016-10-30 LAB — BASIC METABOLIC PANEL
Anion gap: 10 (ref 5–15)
BUN: 5 mg/dL — AB (ref 6–20)
CALCIUM: 8.4 mg/dL — AB (ref 8.9–10.3)
CO2: 22 mmol/L (ref 22–32)
CREATININE: 0.78 mg/dL (ref 0.44–1.00)
Chloride: 103 mmol/L (ref 101–111)
GFR calc non Af Amer: >60 mL/min (ref >60)
GLUCOSE: 112 mg/dL — AB (ref 65–99)
Potassium: 2.9 mmol/L — ABNORMAL LOW (ref 3.5–5.1)
Sodium: 135 mmol/L (ref 135–145)

## 2016-10-30 MED ORDER — ONDANSETRON HCL 4 MG/2ML IJ SOLN
4.0000 mg | Freq: Once | INTRAMUSCULAR | Status: AC
  Administered 2016-10-30: 4 mg via INTRAVENOUS
  Filled 2016-10-30: qty 2

## 2016-10-30 MED ORDER — SODIUM CHLORIDE 0.9 % IV BOLUS (SEPSIS)
1000.0000 mL | Freq: Once | INTRAVENOUS | Status: AC
  Administered 2016-10-30: 1000 mL via INTRAVENOUS

## 2016-10-30 MED ORDER — LEVOFLOXACIN 750 MG PO TABS: 750.0000 mg | ORAL_TABLET | Freq: Every day | ORAL | 0 refills | Status: DC

## 2016-10-30 MED ORDER — OSELTAMIVIR PHOSPHATE 75 MG PO CAPS
75.0000 mg | ORAL_CAPSULE | Freq: Once | ORAL | Status: AC
  Administered 2016-10-30: 75 mg via ORAL
  Filled 2016-10-30: qty 1

## 2016-10-30 MED ORDER — OSELTAMIVIR PHOSPHATE 75 MG PO CAPS: 75.0000 mg | ORAL_CAPSULE | Freq: Two times a day (BID) | ORAL | 0 refills | Status: DC

## 2016-10-30 MED ORDER — HYDROMORPHONE HCL 2 MG/ML IJ SOLN
1.0000 mg | Freq: Once | INTRAMUSCULAR | Status: AC
  Administered 2016-10-30: 1 mg via INTRAVENOUS
  Filled 2016-10-30: qty 1

## 2016-10-30 MED ORDER — ACETAMINOPHEN 500 MG PO TABS
1000.0000 mg | ORAL_TABLET | Freq: Once | ORAL | Status: AC
  Administered 2016-10-30: 1000 mg via ORAL
  Filled 2016-10-30: qty 2

## 2016-10-30 MED ORDER — ALBUTEROL SULFATE (2.5 MG/3ML) 0.083% IN NEBU: 5.0000 mg | INHALATION_SOLUTION | Freq: Once | RESPIRATORY_TRACT | Status: DC

## 2016-10-30 MED ORDER — LEVOFLOXACIN 750 MG PO TABS
750.0000 mg | ORAL_TABLET | Freq: Once | ORAL | Status: AC
  Administered 2016-10-30: 750 mg via ORAL
  Filled 2016-10-30: qty 1

## 2016-10-30 MED ORDER — ALBUTEROL SULFATE HFA 108 (90 BASE) MCG/ACT IN AERS: 2.0000 | INHALATION_SPRAY | RESPIRATORY_TRACT | Status: DC | PRN

## 2016-10-30 NOTE — ED Notes (Signed)
Patient transported to X-ray 

## 2016-10-30 NOTE — ED Notes (Addendum)
Cold washcloth given to cool pt.

## 2016-10-30 NOTE — ED Notes (Signed)
Pt ambulated self efficiently in hallway with no difficulty. Pt heart rate remained at 86 while 02 sat remained at 99 % while ambulating.

## 2016-10-30 NOTE — ED Notes (Signed)
Nurse will draw labs. 

## 2016-10-30 NOTE — ED Notes (Signed)
Delay in lab draw,  Pt not in room 

## 2016-10-30 NOTE — ED Notes (Addendum)
Gave pt meal bag and soda. Pt is asking for pain medicine.

## 2016-10-30 NOTE — ED Triage Notes (Signed)
Pt presents w/ flu like symptoms, cough, n/v.  Pt appears SOB and states that her ribs hurt from "throwing up."  My ribs hurt when I breath"

## 2016-10-30 NOTE — ED Provider Notes (Signed)
[facility]-EMERGENCY DEPT Provider Note   CSN: [id number] Arrival date & time: 10/30/16  0227   By signing my name below, I, Clovis Pu, attest that this documentation has been prepared under the direction and in the presence of Gilda Crease, MD  Electronically Signed: Clovis Pu, ED Scribe. 10/30/16. 2:56 AM.   History   Chief Complaint Chief Complaint  Patient presents with  . Shortness of Breath  . Influenza   The history is provided by the patient. No language interpreter was used.   HPI Comments:  Shelby Hatfield is a 41 y.o. female who presents to the Emergency Department complaining of SOB x today. She also reports left sided rib pain, a persistent cough x 2 days, nausea, vomiting, cough, fevers, headache and ear pain. No alleviating factors noted. Pt denies sore throat. No other associated symptoms noted.   Past Medical History:  Diagnosis Date  . Adenotonsillar hypertrophy 03/2012   snores during sleep; denies apnea; states occ. wakes up coughing  . Anemia   . Anxiety   . Bipolar 1 disorder (HCC)   . 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 Depression   . Diabetes mellitus without complication (HCC)   . Drug-seeking behavior   . GERD (gastroesophageal reflux disease)   . GI bleed   . Heart failure (HCC)   . Hypercholesterolemia   . Migraines   . Obesity   . Pneumonia   . Wears dentures    upper denture    Patient Active Problem List   Diagnosis Date Noted  . Acute esophagitis   . Cameron ulcer   . Gastrointestinal hemorrhage 09/02/2016  . IDA (iron deficiency anemia) 06/30/2016  . Rectal bleeding 06/30/2016  . Chronic diarrhea 06/30/2016  . Gastroesophageal reflux disease with esophagitis   . Hiatal hernia   . Morbid obesity due to excess calories (HCC)   . Protein-calorie malnutrition, severe 06/02/2016  . Upper GI bleed 06/01/2016  . PUD (peptic ulcer disease) 06/01/2016  . Acute  blood loss anemia 06/01/2016  . Acute GI bleeding 06/01/2016  . Abdominal pain, chronic, epigastric 02/12/2016  . 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 Hematemesis 02/12/2016  . Pulmonary nodules 02/12/2016  . Acute on chronic diastolic heart failure (HCC) 01/21/2015  . 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 Nausea with vomiting 01/20/2015  . 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 SOB (shortness of breath) 01/19/2015  . Lower leg edema 01/19/2015  . Diarrhea   . Diabetes mellitus type 2 in obese (HCC)   . COPD (chronic obstructive pulmonary disease) (HCC) 08/18/2014  . COPD exacerbation (HCC) 08/18/2014  . Chest tightness 08/18/2014  . Shortness of breath   . Opiate addiction (HCC) 05/22/2014  . 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 Opiate dependence, continuous (HCC) 04/20/2013  . Benzodiazepine dependence (HCC) 04/20/2013    Past Surgical History:  Procedure Laterality Date  . APPENDECTOMY    . BIOPSY  02/14/2016   Procedure: BIOPSY;  Surgeon: West Bali, MD;  Location: [facility];  Service: Endoscopy;;  gastric biopsies  . CESAREAN SECTION  08/31/2001   total of  3  . CHOLECYSTECTOMY    . DILATION AND EVACUATION  04/09/2000  . ESOPHAGEAL DILATION N/A 09/03/2016   Procedure: ESOPHAGEAL DILATION;  Surgeon: Corbin Ade, MD;  Location: [facility];  Service: Endoscopy;  Laterality: N/A;  . ESOPHAGOGASTRODUODENOSCOPY (EGD) WITH PROPOFOL  N/A 02/14/2016   Dr. Darrick Penna: Large hiatal hernia, nonbleeding cratered gastric ulcer. Multiple nonbleeding erosions in the gastric fundus. Diffuse moderate inflammation, erosions, erythema of the gastric antrum. All biopsies benign with no evidence of H. pylori.  . ESOPHAGOGASTRODUODENOSCOPY (EGD) WITH PROPOFOL N/A 06/02/2016   Procedure: ESOPHAGOGASTRODUODENOSCOPY (EGD) WITH PROPOFOL;  Surgeon: Dorena Cookey, MD;  Location: [facility] ENDOSCOPY;  Service: Endoscopy;  Laterality: N/A;  .  ESOPHAGOGASTRODUODENOSCOPY (EGD) WITH PROPOFOL N/A 09/03/2016   Procedure: ESOPHAGOGASTRODUODENOSCOPY (EGD) WITH PROPOFOL;  Surgeon: Corbin Ade, MD;  Location: [facility];  Service: Endoscopy;  Laterality: N/A;  . HIATAL HERNIA REPAIR  10/2015   planning for repeat surgery in 1/18  . open paraesophageal hernia repair with gastroexy  09/2015   Dr. Francee Gentile  . TONSILLECTOMY     adenoidectomy  . TONSILLECTOMY AND ADENOIDECTOMY  04/12/2012   Procedure: TONSILLECTOMY AND ADENOIDECTOMY;  Surgeon: Darletta Moll, MD;  Location: [facility];  Service: ENT;  Laterality: Bilateral;  . TUBAL LIGATION  08/31/2001    OB History    Gravida Para Term Preterm AB Living   8 3 3   5 3    SAB TAB Ectopic Multiple Live Births   5               Home Medications    Prior to Admission medications   Medication Sig Start Date End Date Taking? Authorizing Provider  acetaminophen (TYLENOL) 500 MG tablet Take 1,500 mg by mouth every 6 (six) hours as needed for mild pain.   Yes Historical Provider, MD  albuterol (PROVENTIL HFA;VENTOLIN HFA) 108 (90 Base) MCG/ACT inhaler Inhale 2 puffs into the lungs every 6 (six) hours as needed for wheezing or shortness of breath. 08/21/16  Yes Evon Slack, PA-C  budesonide-formoterol (SYMBICORT) 160-4.5 MCG/ACT inhaler Inhale 2 puffs into the lungs 2 (two) times daily.   Yes Historical Provider, MD  insulin aspart (NOVOLOG) 100 UNIT/ML injection Inject 0-15 Units into the skin 3 (three) times daily with meals. 09/04/16  Yes Penny Pia, MD  ondansetron (ZOFRAN) 8 MG tablet Take 8 mg by mouth every 8 (eight) hours as needed for nausea or vomiting.   Yes Historical Provider, MD  pantoprazole (PROTONIX) 40 MG tablet Take 40 mg by mouth 2 (two) times daily.   Yes Historical Provider, MD  promethazine (PHENERGAN) 25 MG tablet Take 25 mg by mouth every 6 (six) hours as needed for nausea or vomiting.   Yes Historical Provider, MD  sucralfate (CARAFATE) 1 g tablet  Take 1 tablet (1 g total) by mouth every 6 (six) hours. 09/04/16  Yes Penny Pia, MD    Family History Family History  Problem Relation Age of Onset  . Cancer Other   . Hypertension Mother   . Heart disease Mother   . Cancer Mother   . Cancer Maternal Aunt     not sure primary, in kidney and colon  . Cancer Maternal Uncle     lung cancer   . Stroke Maternal Grandfather     Social History Social History  Substance Use Topics  . Smoking status: Current Some Day Smoker    Packs/day: 0.33    Years: 18.00    Types: E-cigarettes  . Smokeless tobacco: Never Used  . Alcohol use No     Allergies   Lidocaine viscous and Penicillins   Review of Systems Review of Systems  Constitutional: Positive for fever.  HENT: Positive for ear pain. Negative for sore throat.  Respiratory: Positive for cough and shortness of breath.   Gastrointestinal: Positive for nausea and vomiting.  Musculoskeletal: Positive for myalgias.  Neurological: Positive for headaches.  All other systems reviewed and are negative.    Physical Exam Updated Vital Signs BP 111/64   Pulse 70   Temp 99.7 F (37.6 C) (Oral)   Resp 20   Ht 5\' 2"  (1.575 m)   Wt 192 lb (87.1 kg)   SpO2 98%   BMI 35.12 kg/m   Physical Exam  Constitutional: She is oriented to person, place, and time. She appears well-developed and well-nourished. No distress.  HENT:  Head: Normocephalic and atraumatic.  Right Ear: Hearing normal.  Left Ear: Hearing normal.  Nose: Nose normal.  Mouth/Throat: Oropharynx is clear and moist and mucous membranes are normal.  Eyes: Conjunctivae and EOM are normal. Pupils are equal, round, and reactive to light.  Neck: Normal range of motion. Neck supple.  Cardiovascular: Regular rhythm, S1 normal and S2 normal.  Exam reveals no gallop and no friction rub.   No murmur heard. Pulmonary/Chest: Effort normal and breath sounds normal. Tachypnea noted. No respiratory distress. She exhibits  tenderness.  Right chest wall tenderness  Abdominal: Soft. Normal appearance and bowel sounds are normal. There is no hepatosplenomegaly. There is no tenderness. There is no rebound, no guarding, no tenderness at McBurney's point and negative Murphy's sign. No hernia.  Musculoskeletal: Normal range of motion.  Neurological: She is alert and oriented to person, place, and time. She has normal strength. No cranial nerve deficit or sensory deficit. Coordination normal. GCS eye subscore is 4. GCS verbal subscore is 5. GCS motor subscore is 6.  Skin: Skin is warm, dry and intact. No rash noted. No cyanosis.  Psychiatric: She has a normal mood and affect. Her speech is normal and behavior is normal. Thought content normal.  Nursing note and vitals reviewed.    ED Treatments / Results  DIAGNOSTIC STUDIES:  Oxygen Saturation is 100% on RA, normal by my interpretation.    COORDINATION OF CARE:  2:55 AM Discussed treatment plan with pt at bedside and pt agreed to plan.  Labs (all labs ordered are listed, but only abnormal results are displayed) Labs Reviewed  CBC WITH DIFFERENTIAL/PLATELET - Abnormal; Notable for the following:       Result Value   WBC 12.5 (*)    Hemoglobin 7.6 (*)    HCT 26.1 (*)    MCV 66.1 (*)    MCH 19.2 (*)    MCHC 29.1 (*)    RDW 20.0 (*)    Neutro Abs 9.3 (*)    Monocytes Absolute 1.4 (*)    All other components within normal limits  BASIC METABOLIC PANEL - Abnormal; Notable for the following:    Potassium 2.9 (*)    Glucose, Bld 112 (*)    BUN 5 (*)    Calcium 8.4 (*)    All other components within normal limits  URINALYSIS, ROUTINE W REFLEX MICROSCOPIC - Abnormal; Notable for the following:    Color, Urine STRAW (*)    Hgb urine dipstick SMALL (*)    Leukocytes, UA TRACE (*)    Bacteria, UA FEW (*)    Squamous Epithelial / LPF 0-5 (*)    All other components within normal limits    EKG  EKG Interpretation  Date/Time:  Friday October 30 2016  02:43:39 EST Ventricular Rate:  86 PR Interval:    QRS Duration: 99 QT Interval:  342 QTC  Calculation: 409 R Axis:   82 Text Interpretation:  Sinus rhythm Normal ECG Confirmed by POLLINA  MD, CHRISTOPHER [phone]([id number]) on 10/30/2016 2:51:17 AM       Radiology Dg Chest 2 View  Result Date: 10/30/2016 CLINICAL DATA:  Shortness of breath today. Cough for 2 days. Fever. EXAM: CHEST  2 VIEW COMPARISON:  Radiographs 08/21/2016, additional priors. FINDINGS: Unchanged heart size and mediastinal contours. Left basilar opacity likely combination of pleural fluid and airspace disease. Minimal blunting of right costophrenic angle with adjacent atelectasis. Subsegmental atelectasis in the periphery of the left lung. Minimal fluid in the fissures on the lateral view. There is diffuse bronchial thickening. No pneumothorax. No acute osseous abnormality. IMPRESSION: 1. Left pleural effusion with associated basilar opacity, atelectasis versus pneumonia. 2. Trace right pleural effusion and subsegmental atelectasis. 3. Bronchial thickening. Electronically Signed   By: Rubye OaksMelanie  Ehinger M.D.   On: 10/30/2016 03:18    Procedures Procedures (including critical care time)  Medications Ordered in ED Medications  albuterol (PROVENTIL) (2.5 MG/3ML) 0.083% nebulizer solution 5 mg (not administered)  sodium chloride 0.9 % bolus 1,000 mL (1,000 mLs Intravenous New Bag/Given 10/30/16 0317)  HYDROmorphone (DILAUDID) injection 1 mg (1 mg Intravenous Given 10/30/16 0325)  ondansetron (ZOFRAN) injection 4 mg (4 mg Intravenous Given 10/30/16 0325)  acetaminophen (TYLENOL) tablet 1,000 mg (1,000 mg Oral Given 10/30/16 [id number])     Initial Impression / Assessment and Plan / ED Course  I have reviewed the triage vital signs and the nursing notes.  Pertinent labs & imaging results that were available during my care of the patient were reviewed by me and considered in my medical decision making (see chart for details).    She presents to the  emergency department with flulike symptoms have been ongoing for 3 days. In the last 24 hours her symptoms have worsened. Patient reports cough associated with the symptoms. Chest x-ray shows left basilar pneumonia with some effusion. This explains the pain that she has been experiencing. Patient is not hypoxic. She has ambulated here in the ER without any significant instability of her vital signs. Will treat as an outpatient with Levaquin and Tamiflu, follow-up with primary care. Return if her symptoms worsen.  Final Clinical Impressions(s) / ED Diagnoses   Final diagnoses:  Flu-like symptoms  Community acquired pneumonia of left lower lobe of lung (HCC)    New Prescriptions New Prescriptions   No medications on file  I personally performed the services described in this documentation, which was scribed in my presence. The recorded information has been reviewed and is accurate.     Gilda Creasehristopher J Pollina, MD 10/30/16 [phone]

## 2016-12-04 ENCOUNTER — Emergency Department (HOSPITAL_COMMUNITY): Payer: Medicaid Other

## 2016-12-04 ENCOUNTER — Emergency Department (HOSPITAL_COMMUNITY)
Admission: EM | Admit: 2016-12-04 | Discharge: 2016-12-04 | Discharge status: Against Medical Advice | Payer: Medicaid Other | Attending: Emergency Medicine | Admitting: Emergency Medicine

## 2016-12-04 ENCOUNTER — Encounter (HOSPITAL_COMMUNITY): Payer: Self-pay

## 2016-12-04 DIAGNOSIS — F17290 Nicotine dependence, other tobacco product, uncomplicated: Secondary | ICD-10-CM | POA: Diagnosis not present

## 2016-12-04 DIAGNOSIS — N3000 Acute cystitis without hematuria: Secondary | ICD-10-CM | POA: Diagnosis not present

## 2016-12-04 DIAGNOSIS — R600 Localized edema: Secondary | ICD-10-CM | POA: Diagnosis not present

## 2016-12-04 DIAGNOSIS — E119 Type 2 diabetes mellitus without complications: Secondary | ICD-10-CM | POA: Insufficient documentation

## 2016-12-04 DIAGNOSIS — R609 Edema, unspecified: Secondary | ICD-10-CM

## 2016-12-04 DIAGNOSIS — J449 Chronic obstructive pulmonary disease, unspecified: Secondary | ICD-10-CM | POA: Insufficient documentation

## 2016-12-04 DIAGNOSIS — I5033 Acute on chronic diastolic (congestive) heart failure: Secondary | ICD-10-CM | POA: Insufficient documentation

## 2016-12-04 DIAGNOSIS — Z794 Long term (current) use of insulin: Secondary | ICD-10-CM | POA: Diagnosis not present

## 2016-12-04 DIAGNOSIS — R1033 Periumbilical pain: Secondary | ICD-10-CM | POA: Diagnosis present

## 2016-12-04 LAB — URINALYSIS, ROUTINE W REFLEX MICROSCOPIC
Bilirubin Urine: NEGATIVE
Glucose, UA: NEGATIVE mg/dL
Hgb urine dipstick: NEGATIVE
KETONES UR: NEGATIVE mg/dL
NITRITE: POSITIVE — AB
PROTEIN: 30 mg/dL — AB
Specific Gravity, Urine: 1.018 (ref 1.005–1.030)
pH: 5 (ref 5.0–8.0)

## 2016-12-04 LAB — COMPREHENSIVE METABOLIC PANEL
ALBUMIN: 3.3 g/dL — AB (ref 3.5–5.0)
ALT: 19 U/L (ref 14–54)
ANION GAP: 9 (ref 5–15)
AST: 29 U/L (ref 15–41)
Alkaline Phosphatase: 77 U/L (ref 38–126)
BUN: 10 mg/dL (ref 6–20)
CO2: 24 mmol/L (ref 22–32)
Calcium: 9 mg/dL (ref 8.9–10.3)
Chloride: 107 mmol/L (ref 101–111)
Creatinine, Ser: 0.92 mg/dL (ref 0.44–1.00)
GFR calc Af Amer: >60 mL/min (ref >60)
GFR calc non Af Amer: >60 mL/min (ref >60)
GLUCOSE: 89 mg/dL (ref 65–99)
POTASSIUM: 3.5 mmol/L (ref 3.5–5.1)
SODIUM: 140 mmol/L (ref 135–145)
Total Bilirubin: 0.2 mg/dL — ABNORMAL LOW (ref 0.3–1.2)
Total Protein: 6.2 g/dL — ABNORMAL LOW (ref 6.5–8.1)

## 2016-12-04 LAB — CBC
HEMATOCRIT: 30.8 % — AB (ref 36.0–46.0)
Hemoglobin: 8.4 g/dL — ABNORMAL LOW (ref 12.0–15.0)
MCH: 18.3 pg — AB (ref 26.0–34.0)
MCHC: 27.3 g/dL — AB (ref 30.0–36.0)
MCV: 67.1 fL — ABNORMAL LOW (ref 78.0–100.0)
Platelets: 442 10*3/uL — ABNORMAL HIGH (ref 150–400)
RBC: 4.59 MIL/uL (ref 3.87–5.11)
RDW: 21 % — ABNORMAL HIGH (ref 11.5–15.5)
WBC: 6.9 10*3/uL (ref 4.0–10.5)

## 2016-12-04 LAB — LIPASE, BLOOD: LIPASE: 11 U/L (ref 11–51)

## 2016-12-04 MED ORDER — SUCRALFATE 1 GM/10ML PO SUSP
1.0000 g | Freq: Three times a day (TID) | ORAL | Status: DC
  Filled 2016-12-04 (×2): qty 10

## 2016-12-04 MED ORDER — FUROSEMIDE 10 MG/ML IJ SOLN
40.0000 mg | Freq: Once | INTRAMUSCULAR | Status: DC
  Filled 2016-12-04: qty 4

## 2016-12-04 NOTE — ED Notes (Signed)
ED Provider at bedside. 

## 2016-12-04 NOTE — ED Notes (Addendum)
Patient not in room for nurse to draw labs and give medications. Called imaging, states pt is not with them. EDP made aware.

## 2016-12-04 NOTE — ED Triage Notes (Signed)
Pt reports bilateral lower extremity swelling that started about a week ago. She states she has been taking her mom's Lasix pills. She also reports abdominal pain. Denies chest pain or SOB.

## 2016-12-04 NOTE — ED Notes (Signed)
Attempted PIVx2. Labs collected off of 2nd attempt.

## 2016-12-04 NOTE — ED Provider Notes (Signed)
[facility]-EMERGENCY DEPT Provider Note   CSN: [id number] Arrival date & time: 12/04/16  1647     History   Chief Complaint Chief Complaint  Patient presents with  . Leg Swelling    HPI Shelby Hatfield is a 41 y.o. female with pertinent pmh of anemia, COPD, T2DM, GERD, PUD, gastritis, reported heart failure (unable to find it on EMR) and PSH of cholecystectomy, appendectomy and hiatal hernia repair presents to ED with two concerns.   Patient reports periumbilical abdominal pain associated with nausea and vomiting x 1 week.  Patient denies fevers, diarrhea, constipation, dark/black stools, rectal bleeding.  No heavy use of ETOH or NSAIds. Patient has been compliant with PPI and carafate.    Patient also reports symmetric, bilateral lower extremity edema associated with mild redness and pain x 1 week.  Aggravating factors including prolonged walking and sitting.  Alleviating factors include elevation and lasix (patient has been taking her mother's lasix).  Patient denies asymmetric leg swelling, previous DVT/PE, estrogen use, hemoptysis, calf cramping/pain, recent surgery or prolonged travel, h/o active cancer, shortness of breath or pleuritic chest pain.  HPI  Past Medical History:  Diagnosis Date  . Adenotonsillar hypertrophy 03/2012   snores during sleep; denies apnea; states occ. wakes up coughing  . Anemia   . Anxiety   . Bipolar 1 disorder (HCC)   . 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 Depression   . Diabetes mellitus without complication (HCC)   . Drug-seeking behavior   . GERD (gastroesophageal reflux disease)   . GI bleed   . Heart failure (HCC)   . Hypercholesterolemia   . Migraines   . Obesity   . Pneumonia   . Wears dentures    upper denture    Patient Active Problem List   Diagnosis Date Noted  . Acute esophagitis   . Cameron ulcer   . Gastrointestinal hemorrhage 09/02/2016  . IDA (iron deficiency  anemia) 06/30/2016  . Rectal bleeding 06/30/2016  . Chronic diarrhea 06/30/2016  . Gastroesophageal reflux disease with esophagitis   . Hiatal hernia   . Morbid obesity due to excess calories (HCC)   . Protein-calorie malnutrition, severe 06/02/2016  . Upper GI bleed 06/01/2016  . PUD (peptic ulcer disease) 06/01/2016  . Acute blood loss anemia 06/01/2016  . Acute GI bleeding 06/01/2016  . Abdominal pain, chronic, epigastric 02/12/2016  . 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 Hematemesis 02/12/2016  . Pulmonary nodules 02/12/2016  . Acute on chronic diastolic heart failure (HCC) 01/21/2015  . 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 Nausea with vomiting 01/20/2015  . 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 SOB (shortness of breath) 01/19/2015  . Lower leg edema 01/19/2015  . Diarrhea   . Diabetes mellitus type 2 in obese (HCC)   . COPD (chronic obstructive pulmonary disease) (HCC) 08/18/2014  . COPD exacerbation (HCC) 08/18/2014  . Chest tightness 08/18/2014  . Shortness of breath   . Opiate addiction (HCC) 05/22/2014  . 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 Opiate dependence, continuous (HCC) 04/20/2013  . Benzodiazepine dependence (HCC) 04/20/2013    Past Surgical History:  Procedure Laterality Date  . APPENDECTOMY    . BIOPSY  02/14/2016   Procedure: BIOPSY;  Surgeon: West Bali, MD;  Location: [facility];  Service: Endoscopy;;  gastric biopsies  . CESAREAN SECTION  08/31/2001   total of  3  .  CHOLECYSTECTOMY    . DILATION AND EVACUATION  04/09/2000  . ESOPHAGEAL DILATION N/A 09/03/2016   Procedure: ESOPHAGEAL DILATION;  Surgeon: Corbin Ade, MD;  Location: [facility];  Service: Endoscopy;  Laterality: N/A;  . ESOPHAGOGASTRODUODENOSCOPY (EGD) WITH PROPOFOL N/A 02/14/2016   Dr. Darrick Penna: Large hiatal hernia, nonbleeding cratered gastric ulcer. Multiple nonbleeding erosions in the gastric fundus. Diffuse moderate  inflammation, erosions, erythema of the gastric antrum. All biopsies benign with no evidence of H. pylori.  . ESOPHAGOGASTRODUODENOSCOPY (EGD) WITH PROPOFOL N/A 06/02/2016   Procedure: ESOPHAGOGASTRODUODENOSCOPY (EGD) WITH PROPOFOL;  Surgeon: Dorena Cookey, MD;  Location: [facility] ENDOSCOPY;  Service: Endoscopy;  Laterality: N/A;  . ESOPHAGOGASTRODUODENOSCOPY (EGD) WITH PROPOFOL N/A 09/03/2016   Procedure: ESOPHAGOGASTRODUODENOSCOPY (EGD) WITH PROPOFOL;  Surgeon: Corbin Ade, MD;  Location: [facility];  Service: Endoscopy;  Laterality: N/A;  . HIATAL HERNIA REPAIR  10/2015   planning for repeat surgery in 1/18  . open paraesophageal hernia repair with gastroexy  09/2015   Dr. Francee Gentile  . TONSILLECTOMY     adenoidectomy  . TONSILLECTOMY AND ADENOIDECTOMY  04/12/2012   Procedure: TONSILLECTOMY AND ADENOIDECTOMY;  Surgeon: Darletta Moll, MD;  Location: [facility];  Service: ENT;  Laterality: Bilateral;  . TUBAL LIGATION  08/31/2001    OB History    Gravida Para Term Preterm AB Living   8 3 3   5 3    SAB TAB Ectopic Multiple Live Births   5               Home Medications    Prior to Admission medications   Medication Sig Start Date End Date Taking? Authorizing Provider  acetaminophen (TYLENOL) 500 MG tablet Take 1,500 mg by mouth every 6 (six) hours as needed for mild pain.    Historical Provider, MD  albuterol (PROVENTIL HFA;VENTOLIN HFA) 108 (90 Base) MCG/ACT inhaler Inhale 2 puffs into the lungs every 6 (six) hours as needed for wheezing or shortness of breath. 08/21/16   Evon Slack, PA-C  budesonide-formoterol [facility]) 160-4.5 MCG/ACT inhaler Inhale 2 puffs into the lungs 2 (two) times daily.    Historical Provider, MD  insulin aspart (NOVOLOG) 100 UNIT/ML injection Inject 0-15 Units into the skin 3 (three) times daily with meals. 09/04/16   Penny Pia, MD  levofloxacin (LEVAQUIN) 750 MG tablet Take 1 tablet (750 mg total) by mouth daily. 10/30/16   Gilda Crease, MD  ondansetron (ZOFRAN) 8 MG tablet Take 8 mg by mouth every 8 (eight) hours as needed for nausea or vomiting.    Historical Provider, MD  oseltamivir (TAMIFLU) 75 MG capsule Take 1 capsule (75 mg total) by mouth every 12 (twelve) hours. 10/30/16   Gilda Crease, MD  pantoprazole (PROTONIX) 40 MG tablet Take 40 mg by mouth 2 (two) times daily.    Historical Provider, MD  promethazine (PHENERGAN) 25 MG tablet Take 25 mg by mouth every 6 (six) hours as needed for nausea or vomiting.    Historical Provider, MD  sucralfate (CARAFATE) 1 g tablet Take 1 tablet (1 g total) by mouth every 6 (six) hours. 09/04/16   Penny Pia, MD    Family History Family History  Problem Relation Age of Onset  . Cancer Other   . Hypertension Mother   . Heart disease Mother   . Cancer Mother   . Cancer Maternal Aunt     not sure primary, in kidney and colon  . Cancer Maternal Uncle  lung cancer   . Stroke Maternal Grandfather     Social History Social History  Substance Use Topics  . Smoking status: Current Some Day Smoker    Packs/day: 0.33    Years: 18.00    Types: E-cigarettes  . Smokeless tobacco: Never Used  . Alcohol use No     Allergies   Lidocaine viscous and Penicillins   Review of Systems Review of Systems  Constitutional: Negative for chills and fever.  Respiratory: Negative for cough and shortness of breath.   Cardiovascular: Positive for leg swelling. Negative for chest pain and palpitations.  Gastrointestinal: Positive for abdominal pain, nausea and vomiting. Negative for anal bleeding, blood in stool, constipation and diarrhea.  Genitourinary: Negative for difficulty urinating, dysuria, flank pain, hematuria, pelvic pain, urgency, vaginal bleeding and vaginal discharge.  Musculoskeletal: Positive for myalgias (lower extremity pain). Negative for joint swelling.  Skin: Positive for color change.  Neurological: Negative for weakness, numbness and headaches.      Physical Exam Updated Vital Signs BP 125/64   Pulse 94   Temp 97.5 F (36.4 C) (Oral)   Resp 17   LMP 08/28/2016   SpO2 97%   Physical Exam  Constitutional: She is oriented to person, place, and time. She appears well-developed and well-nourished.  HENT:  Head: Normocephalic and atraumatic.  Nose: Nose normal.  Mouth/Throat: Oropharynx is clear and moist. No oropharyngeal exudate.  Moist mucous membranes  Eyes: Conjunctivae and EOM are normal. Pupils are equal, round, and reactive to light.  Neck: Normal range of motion. Neck supple. No JVD present.  Cardiovascular: Normal rate, regular rhythm, normal heart sounds and intact distal pulses.   No murmur heard. Pulmonary/Chest: Effort normal and breath sounds normal. No respiratory distress. She has no wheezes. She has no rales.  Abdominal: Soft. Bowel sounds are normal. She exhibits no distension and no mass. There is tenderness. There is no rebound and no guarding.  Periumbilical tenderness with deep palpation.   Abdomen is soft, without distention, rigidity, guarding or rebound.  No surgical abdominal scars noted.  No pulsating masses.  + Bowel sounds throughout.  No suprapubic tenderness.  No CVAT.  Negative Murphy's. Negative McBurney's. Negative Psoas sign.  Non palpable kidneys. No hepatosplenomegaly.   Musculoskeletal: Normal range of motion. She exhibits edema. She exhibits no tenderness or deformity.  2+ bilateral, symmetric lower extremity edema with bilateral faint erythema and warmth on anterior lower legs.  No focal erythema, edema, warmth or effusion to lower erxtremity large joints including hips, knees, ankles.   Full active ROM of lower extremities.  Lymphadenopathy:    She has no cervical adenopathy.  Neurological: She is alert and oriented to person, place, and time. No sensory deficit.  Gait normal. No foot drop.  5/5 strength with hip flexion and extension, bilaterally.  5/5 strength with knee  flexion and extension, bilaterally.  5/5 strength with ankle dorsiflexion and plantar flexion, bilaterally.  Sensation to light touch intact in the distribution of the obturator nerve, lateral cutaneous nerve, femoral nerve, common fibular nerve.   Foot: sensation to light touch intact in the distribution of the saphenous nerve, medial plantar nerve, lateral plantar nerve, bilaterally.   Skin: Skin is warm and dry. Capillary refill takes less than 2 seconds. There is erythema.  Psychiatric: She has a normal mood and affect. Her behavior is normal. Judgment and thought content normal.  Nursing note and vitals reviewed.    ED Treatments / Results  Labs (all labs ordered are  listed, but only abnormal results are displayed) Labs Reviewed  COMPREHENSIVE METABOLIC PANEL - Abnormal; Notable for the following:       Result Value   Total Protein 6.2 (*)    Albumin 3.3 (*)    Total Bilirubin 0.2 (*)    All other components within normal limits  CBC - Abnormal; Notable for the following:    Hemoglobin 8.4 (*)    HCT 30.8 (*)    MCV 67.1 (*)    MCH 18.3 (*)    MCHC 27.3 (*)    RDW 21.0 (*)    Platelets 442 (*)    All other components within normal limits  URINALYSIS, ROUTINE W REFLEX MICROSCOPIC - Abnormal; Notable for the following:    APPearance HAZY (*)    Protein, ur 30 (*)    Nitrite POSITIVE (*)    Leukocytes, UA LARGE (*)    Bacteria, UA FEW (*)    Squamous Epithelial / LPF 0-5 (*)    All other components within normal limits  URINE CULTURE  LIPASE, BLOOD  D-DIMER, QUANTITATIVE (NOT AT [facility])    EKG  EKG Interpretation None       Radiology Dg Chest 2 View  Result Date: 12/04/2016 CLINICAL DATA:  Bilateral lower extremity swelling for 1 week. EXAM: CHEST  2 VIEW COMPARISON:  10/30/2016 FINDINGS: Left lateral costophrenic angle blunting may be chronic, as is unchanged from 10/30/2016. Mild linear basilar scarring or atelectasis on the left is also unchanged. The right lung  is clear. The pulmonary vasculature is normal. Hilar, mediastinal and cardiac contours are unremarkable and unchanged. IMPRESSION: Unchanged linear left base scarring or atelectasis and left lateral costophrenic angle blunting. Right lung is clear. Electronically Signed   By: Ellery Plunkaniel R Mitchell M.D.   On: 12/04/2016 21:32    Procedures Procedures (including critical care time)  Medications Ordered in ED Medications  sucralfate (CARAFATE) 1 GM/10ML suspension 1 g (not administered)  furosemide (LASIX) injection 40 mg (not administered)     Initial Impression / Assessment and Plan / ED Course  I have reviewed the triage vital signs and the nursing notes.  Pertinent labs & imaging results that were available during my care of the patient were reviewed by me and considered in my medical decision making (see chart for details).  Clinical Course as of Dec 06 50  Fri Dec 04, 2016  2217 IMPRESSION: Unchanged linear left base scarring or atelectasis and left lateral costophrenic angle blunting. Right lung is clear. DG Chest 2 View [CG]  Sat Dec 05, 2016  0049 AST: 29 [CG]  0049 ALT: 19 [CG]  0049 Alkaline Phosphatase: 77 [CG]  0049 Lipase: 11 [CG]  0049 WBC: 6.9 [CG]  0049 Temp: 97.5 F (36.4 C) [CG]  0049 Pulse Rate: 75 [CG]  0049 BP: 110/69 [CG]  0049 Resp: 16 [CG]  0049 Appearance: (!) HAZY [CG]  0050 Nitrite: (!) POSITIVE [CG]  0050 Leukocytes, UA: (!) LARGE [CG]  0050 WBC, UA: TOO NUMEROUS TO COUNT [CG]  0050 H/o anemia Hemoglobin: (!) 8.4 [CG]    Clinical Course User Index [CG] Liberty Handylaudia J Terisa Belardo, PA-C   Urinalysis shows urinary tract infection. LFTs and lipase reassuring. No signs of pulmonary edema on chest x-ray. Hemoglobin 8.4, this appears to be patient's baseline compared to previous hemoglobin. Planned to give Lasix, Carafate suspension and obtain d-dimer to rule out DVTs. I discussed urinalysis results with patient, I notified her that she likely will need antibiotics.  Unfortunately, Patient left  AMA prior to completion of workup and without prescriptions.   Final Clinical Impressions(s) / ED Diagnoses   Final diagnoses:  Peripheral edema  Acute cystitis without hematuria    New Prescriptions Discharge Medication List as of 12/04/2016 10:40 PM       Liberty Handy, PA-C 12/05/16 1308    Rolan Bucco, MD 12/05/16 1435

## 2016-12-04 NOTE — ED Notes (Signed)
Patient transported to X-ray 

## 2016-12-04 NOTE — ED Notes (Signed)
Redraw D-dimer due to blood clots

## 2016-12-08 LAB — URINE CULTURE: Culture: >=100000 — AB

## 2016-12-09 ENCOUNTER — Telehealth: Payer: Self-pay | Admitting: Emergency Medicine

## 2016-12-09 NOTE — Telephone Encounter (Signed)
Post ED Visit - Positive Culture Follow-up: Successful Patient Follow-Up  Culture assessed and recommendations reviewed by: [x]  Enzo BiNathan Batchelder, Pharm.D. []  Celedonio MiyamotoJeremy Frens, Pharm.D., BCPS []  Garvin FilaMike Maccia, Pharm.D. []  Georgina PillionElizabeth Martin, Pharm.D., BCPS []  [place] BeachMinh Pham, VermontPharm.D., BCPS, AAHIVP []  Estella HuskMichelle Turner, Pharm.D., BCPS, AAHIVP []  Tennis Mustassie Stewart, Pharm.D. []  Sherle Poeob Vincent, [address]Pharm[address]D.  Positive urine culture  [x]  Patient discharged without antimicrobial prescription and treatment is now indicated []  Organism is resistant to prescribed ED discharge antimicrobial []  Patient with positive blood cultures  Changes discussed with ED provider: Elizabeth SauerJaime Ward PA New antibiotic prescription symptom check if + start Bactrim DS 1 tab po bid x 3 days  Attempting to contact patient   Berle MullMiller, Anthonella Klausner 12/09/2016, 2:26 PM

## 2016-12-09 NOTE — Progress Notes (Signed)
ED Antimicrobial Stewardship Positive Culture Follow Up   Shelby Hatfield is an 41 y.o. female [facility] with a chief complaint of  Chief Complaint  Patient presents with  . Leg Swelling    Recent Results (from the past 720 hour(s))  Urine culture     Status: Abnormal   Collection Time: 12/04/16  8:50 PM  Result Value Ref Range Status   Specimen Description URINE, RANDOM  Final   Special Requests NONE  Final   Culture >=100,000 COLONIES/mL ESCHERICHIA COLI (A)  Final   Report Status 12/08/2016 FINAL  Final   Organism ID, Bacteria ESCHERICHIA COLI (A)  Final      Susceptibility   Escherichia coli - MIC*    AMPICILLIN >=32 RESISTANT Resistant     CEFAZOLIN <=4 SENSITIVE Sensitive     CEFTRIAXONE <=1 SENSITIVE Sensitive     CIPROFLOXACIN <=0.25 SENSITIVE Sensitive     GENTAMICIN <=1 SENSITIVE Sensitive     IMIPENEM <=0.25 SENSITIVE Sensitive     NITROFURANTOIN <=16 SENSITIVE Sensitive     TRIMETH/SULFA <=20 SENSITIVE Sensitive     AMPICILLIN/SULBACTAM >=32 RESISTANT Resistant     PIP/TAZO <=4 SENSITIVE Sensitive     Extended ESBL NEGATIVE Sensitive     * >=100,000 COLONIES/mL ESCHERICHIA COLI    Patient left AMA before further workup.   Patient placement to call patient and ask if symptomatic, if need to treat give Bactrim 1 DS tablet PO BID x 3 days  ED Provider: Elizabeth SauerJaime Ward PA-C   Armandina StammerBATCHELDER,Asyah Candler J 12/09/2016, 9:44 AM Infectious Diseases Pharmacist Phone# [phone]

## 2016-12-29 ENCOUNTER — Telehealth: Payer: Self-pay | Admitting: Emergency Medicine

## 2016-12-29 NOTE — Telephone Encounter (Signed)
Lost to followup 

## 2016-12-31 ENCOUNTER — Other Ambulatory Visit: Payer: Self-pay | Admitting: Gastroenterology

## 2017-02-19 ENCOUNTER — Emergency Department (HOSPITAL_COMMUNITY): Payer: Medicaid Other

## 2017-02-19 ENCOUNTER — Emergency Department (HOSPITAL_COMMUNITY)
Admission: EM | Admit: 2017-02-19 | Discharge: 2017-02-19 | Disposition: A | Discharge status: Home / Self Care | Payer: Medicaid Other | Attending: Emergency Medicine | Admitting: Emergency Medicine

## 2017-02-19 ENCOUNTER — Encounter (HOSPITAL_COMMUNITY): Payer: Self-pay

## 2017-02-19 DIAGNOSIS — J441 Chronic obstructive pulmonary disease with (acute) exacerbation: Secondary | ICD-10-CM | POA: Diagnosis not present

## 2017-02-19 DIAGNOSIS — F17290 Nicotine dependence, other tobacco product, uncomplicated: Secondary | ICD-10-CM | POA: Diagnosis not present

## 2017-02-19 DIAGNOSIS — Z79899 Other long term (current) drug therapy: Secondary | ICD-10-CM | POA: Diagnosis not present

## 2017-02-19 DIAGNOSIS — M545 Low back pain: Secondary | ICD-10-CM | POA: Diagnosis present

## 2017-02-19 DIAGNOSIS — S76011A Strain of muscle, fascia and tendon of right hip, initial encounter: Secondary | ICD-10-CM | POA: Diagnosis not present

## 2017-02-19 DIAGNOSIS — Z794 Long term (current) use of insulin: Secondary | ICD-10-CM | POA: Insufficient documentation

## 2017-02-19 DIAGNOSIS — W01190A Fall on same level from slipping, tripping and stumbling with subsequent striking against furniture, initial encounter: Secondary | ICD-10-CM | POA: Diagnosis not present

## 2017-02-19 DIAGNOSIS — R1031 Right lower quadrant pain: Secondary | ICD-10-CM | POA: Diagnosis not present

## 2017-02-19 DIAGNOSIS — Y92012 Bathroom of single-family (private) house as the place of occurrence of the external cause: Secondary | ICD-10-CM | POA: Diagnosis not present

## 2017-02-19 DIAGNOSIS — Y93E1 Activity, personal bathing and showering: Secondary | ICD-10-CM | POA: Insufficient documentation

## 2017-02-19 DIAGNOSIS — I509 Heart failure, unspecified: Secondary | ICD-10-CM | POA: Insufficient documentation

## 2017-02-19 DIAGNOSIS — Y998 Other external cause status: Secondary | ICD-10-CM | POA: Insufficient documentation

## 2017-02-19 DIAGNOSIS — S76019A Strain of muscle, fascia and tendon of unspecified hip, initial encounter: Secondary | ICD-10-CM

## 2017-02-19 DIAGNOSIS — E119 Type 2 diabetes mellitus without complications: Secondary | ICD-10-CM | POA: Insufficient documentation

## 2017-02-19 LAB — COMPREHENSIVE METABOLIC PANEL
ALBUMIN: 3.8 g/dL (ref 3.5–5.0)
ALT: 24 U/L (ref 14–54)
ANION GAP: 10 (ref 5–15)
AST: 34 U/L (ref 15–41)
Alkaline Phosphatase: 86 U/L (ref 38–126)
BUN: 7 mg/dL (ref 6–20)
CHLORIDE: 112 mmol/L — AB (ref 101–111)
CO2: 18 mmol/L — AB (ref 22–32)
Calcium: 9.3 mg/dL (ref 8.9–10.3)
Creatinine, Ser: 1.08 mg/dL — ABNORMAL HIGH (ref 0.44–1.00)
GFR calc non Af Amer: >60 mL/min (ref >60)
GLUCOSE: 76 mg/dL (ref 65–99)
POTASSIUM: 3.7 mmol/L (ref 3.5–5.1)
SODIUM: 140 mmol/L (ref 135–145)
Total Bilirubin: 0.3 mg/dL (ref 0.3–1.2)
Total Protein: 6.9 g/dL (ref 6.5–8.1)

## 2017-02-19 LAB — I-STAT BETA HCG BLOOD, ED (MC, WL, AP ONLY): I-stat hCG, quantitative: <5 m[IU]/mL (ref <5)

## 2017-02-19 LAB — CBC
HEMATOCRIT: 36.4 % (ref 36.0–46.0)
HEMOGLOBIN: 11 g/dL — AB (ref 12.0–15.0)
MCH: 21.9 pg — AB (ref 26.0–34.0)
MCHC: 30.2 g/dL (ref 30.0–36.0)
MCV: 72.4 fL — ABNORMAL LOW (ref 78.0–100.0)
Platelets: 324 10*3/uL (ref 150–400)
RBC: 5.03 MIL/uL (ref 3.87–5.11)
RDW: 21.6 % — ABNORMAL HIGH (ref 11.5–15.5)
WBC: 8.5 10*3/uL (ref 4.0–10.5)

## 2017-02-19 LAB — LIPASE, BLOOD: LIPASE: 19 U/L (ref 11–51)

## 2017-02-19 MED ORDER — IBUPROFEN 800 MG PO TABS: 800.0000 mg | ORAL_TABLET | Freq: Once | ORAL | Status: DC

## 2017-02-19 MED ORDER — CYCLOBENZAPRINE HCL 10 MG PO TABS: 10.0000 mg | ORAL_TABLET | Freq: Two times a day (BID) | ORAL | 0 refills | Status: DC | PRN

## 2017-02-19 MED ORDER — ONDANSETRON HCL 8 MG PO TABS: 8.0000 mg | ORAL_TABLET | Freq: Three times a day (TID) | ORAL | 0 refills | Status: DC | PRN

## 2017-02-19 MED ORDER — ONDANSETRON 4 MG PO TBDP
4.0000 mg | ORAL_TABLET | Freq: Once | ORAL | Status: AC
  Administered 2017-02-19: 4 mg via ORAL
  Filled 2017-02-19: qty 1

## 2017-02-19 MED ORDER — CYCLOBENZAPRINE HCL 10 MG PO TABS
5.0000 mg | ORAL_TABLET | Freq: Once | ORAL | Status: AC
  Administered 2017-02-19: 5 mg via ORAL
  Filled 2017-02-19: qty 1

## 2017-02-19 MED ORDER — TRAMADOL HCL 50 MG PO TABS
50.0000 mg | ORAL_TABLET | Freq: Once | ORAL | Status: AC
  Administered 2017-02-19: 50 mg via ORAL
  Filled 2017-02-19 (×2): qty 1

## 2017-02-19 NOTE — ED Notes (Signed)
Patient transported to X-ray 

## 2017-02-19 NOTE — ED Triage Notes (Signed)
Pt reports she tripped in the shower/bathtub today just PTA. She states she split the tub, one leg in the tub the other leg outside of the tub. She reports central abdominal pain and right hip pain. No LOC, no head injury.

## 2017-02-19 NOTE — ED Provider Notes (Signed)
[facility]-EM[facility]Y DEPT Provider Note   CSN: [id number] Arrival date & time: 02/19/17  1650     History   Chief Complaint Chief Complaint  Patient presents with  . Fall    HPI Shelby Hatfield is a 41 y.o. female.  HPI 41 year old female with history of opiate dependence, drug-induced mood disorder, bipolar, COPD, diabetes, obesity, GI bleed presenting for evaluation of a recent fall. Patient states her daughter was washing her hair earlier and the bathroom was slippery afterward. Patient went to shower, when she finished showering and stepped out from the top, her leg slipped, causing her to do a split and fell backward, striking her lower back against the vanity. She denies hitting her head or loss of consciousness. She report acute onset of sharp throbbing pain primarily to her back, and her right groin. Pain is severe, however she was able to ambulate and came straight to the ER for evaluation. She denies any precipitating symptoms prior to the fall. She denies any new numbness or weakness. She has not noticed any bruising.    Past Medical History:  Diagnosis Date  . Adenotonsillar hypertrophy 03/2012   snores during sleep; denies apnea; states occ. wakes up coughing  . Anemia   . Anxiety   . Bipolar 1 disorder (HCC)   . 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 Depression   . Diabetes mellitus without complication (HCC)   . Drug-seeking behavior   . GERD (gastroesophageal reflux disease)   . GI bleed   . Heart failure (HCC)   . Hypercholesterolemia   . Migraines   . Obesity   . Pneumonia   . Wears dentures    upper denture    Patient Active Problem List   Diagnosis Date Noted  . Acute esophagitis   . Cameron ulcer   . Gastrointestinal hemorrhage 09/02/2016  . IDA (iron deficiency anemia) 06/30/2016  . Rectal bleeding 06/30/2016  . Chronic diarrhea 06/30/2016  . Gastroesophageal reflux disease with  esophagitis   . Hiatal hernia   . Morbid obesity due to excess calories (HCC)   . Protein-calorie malnutrition, severe 06/02/2016  . Upper GI bleed 06/01/2016  . PUD (peptic ulcer disease) 06/01/2016  . Acute blood loss anemia 06/01/2016  . Acute GI bleeding 06/01/2016  . Abdominal pain, chronic, epigastric 02/12/2016  . 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 Hematemesis 02/12/2016  . Pulmonary nodules 02/12/2016  . Acute on chronic diastolic heart failure (HCC) 01/21/2015  . 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 Nausea with vomiting 01/20/2015  . 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 SOB (shortness of breath) 01/19/2015  . Lower leg edema 01/19/2015  . Diarrhea   . Diabetes mellitus type 2 in obese (HCC)   . COPD (chronic obstructive pulmonary disease) (HCC) 08/18/2014  . COPD exacerbation (HCC) 08/18/2014  . Chest tightness 08/18/2014  . Shortness of breath   . Opiate addiction (HCC) 05/22/2014  . 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 Opiate dependence, continuous (HCC) 04/20/2013  . Benzodiazepine dependence (HCC) 04/20/2013    Past Surgical History:  Procedure Laterality Date  . APPENDECTOMY    . BIOPSY  02/14/2016   Procedure: BIOPSY;  Surgeon: West BaliSandi L Fields, MD;  Location: [facility];  Service: Endoscopy;;  gastric biopsies  . CESAREAN SECTION  08/31/2001   total of  3  . CHOLECYSTECTOMY    . DILATION AND EVACUATION  04/09/2000  . ESOPHAGEAL DILATION N/A 09/03/2016   Procedure: ESOPHAGEAL DILATION;  Surgeon: Corbin Ade, MD;  Location: [facility];  Service: Endoscopy;  Laterality: N/A;  . ESOPHAGOGASTRODUODENOSCOPY (EGD) WITH PROPOFOL N/A 02/14/2016   Dr. Darrick Penna: Large hiatal hernia, nonbleeding cratered gastric ulcer. Multiple nonbleeding erosions in the gastric fundus. Diffuse moderate inflammation, erosions, erythema of the gastric antrum. All biopsies benign with no evidence of H. pylori.  .  ESOPHAGOGASTRODUODENOSCOPY (EGD) WITH PROPOFOL N/A 06/02/2016   Procedure: ESOPHAGOGASTRODUODENOSCOPY (EGD) WITH PROPOFOL;  Surgeon: Dorena Cookey, MD;  Location: [facility] ENDOSCOPY;  Service: Endoscopy;  Laterality: N/A;  . ESOPHAGOGASTRODUODENOSCOPY (EGD) WITH PROPOFOL N/A 09/03/2016   Procedure: ESOPHAGOGASTRODUODENOSCOPY (EGD) WITH PROPOFOL;  Surgeon: Corbin Ade, MD;  Location: [facility];  Service: Endoscopy;  Laterality: N/A;  . HIATAL HERNIA REPAIR  10/2015   planning for repeat surgery in 1/18  . open paraesophageal hernia repair with gastroexy  09/2015   Dr. Francee Gentile  . TONSILLECTOMY     adenoidectomy  . TONSILLECTOMY AND ADENOIDECTOMY  04/12/2012   Procedure: TONSILLECTOMY AND ADENOIDECTOMY;  Surgeon: Darletta Moll, MD;  Location: [facility];  Service: ENT;  Laterality: Bilateral;  . TUBAL LIGATION  08/31/2001    OB History    Gravida Para Term Preterm AB Living   8 3 3   5 3    SAB TAB Ectopic Multiple Live Births   5               Home Medications    Prior to Admission medications   Medication Sig Start Date End Date Taking? Authorizing Provider  acetaminophen (TYLENOL) 500 MG tablet Take 1,500 mg by mouth every 6 (six) hours as needed for mild pain.    [provider]  albuterol (PROVENTIL HFA;VENTOLIN HFA) 108 (90 Base) MCG/ACT inhaler Inhale 2 puffs into the lungs every 6 (six) hours as needed for wheezing or shortness of breath. 08/21/16   Evon Slack, PA-C  budesonide-formoterol (SYMBICORT) 160-4.5 MCG/ACT inhaler Inhale 2 puffs into the lungs 2 (two) times daily.    [provider]  insulin aspart (NOVOLOG) 100 UNIT/ML injection Inject 0-15 Units into the skin 3 (three) times daily with meals. 09/04/16   Penny Pia, MD  levofloxacin (LEVAQUIN) 750 MG tablet Take 1 tablet (750 mg total) by mouth daily. 10/30/16   Gilda Crease, MD  ondansetron (ZOFRAN) 8 MG tablet Take 8 mg by mouth every 8 (eight) hours as needed for nausea or  vomiting.    [provider]  oseltamivir (TAMIFLU) 75 MG capsule Take 1 capsule (75 mg total) by mouth every 12 (twelve) hours. 10/30/16   Gilda Crease, MD  pantoprazole (PROTONIX) 40 MG tablet Take 40 mg by mouth 2 (two) times daily.    [provider]  promethazine (PHENERGAN) 25 MG tablet TAKE 1/2 TO 1 TABLET BY MOUTH EVERY 6 HOURS AS NEEDED FOR NAUSEA AND VOMITING 01/01/17   Gelene Mink, NP  sucralfate (CARAFATE) 1 g tablet Take 1 tablet (1 g total) by mouth every 6 (six) hours. 09/04/16   Penny Pia, MD    Family History Family History  Problem Relation Age of Onset  . Cancer Other   . Hypertension Mother   . Heart disease Mother   . Cancer Mother   . Cancer Maternal Aunt        not sure primary, in kidney and colon  . Cancer Maternal Uncle  lung cancer   . Stroke Maternal Grandfather     Social History Social History  Substance Use Topics  . Smoking status: Current Some Day Smoker    Packs/day: 0.33    Years: 18.00    Types: E-cigarettes  . Smokeless tobacco: Never Used  . Alcohol use No     Allergies   Lidocaine viscous and Penicillins   Review of Systems Review of Systems  All other systems reviewed and are negative.    Physical Exam Updated Vital Signs BP 128/75   Pulse (!) 120   Temp 99.1 F (37.3 C) (Oral)   Resp 16   Ht 5\' 1"  (1.549 m)   Wt 86.2 kg (190 lb)   SpO2 100%   BMI 35.90 kg/m   Physical Exam  Constitutional: She is oriented to person, place, and time. She appears well-developed and well-nourished. No distress.  Obese female resting comfortably in bed in no acute discomfort.  HENT:  Head: Atraumatic.  No scalp tenderness  Eyes: Conjunctivae are normal.  Neck: Neck supple.  Cardiovascular: Normal rate, regular rhythm and intact distal pulses.   Pulmonary/Chest: Effort normal and breath sounds normal.  Abdominal: Soft. She exhibits no distension. There is no tenderness.  Musculoskeletal: She  exhibits tenderness (Tenderness along lumbar spine on palpation without crepitus or step-off. No bruising noted. Tenderness to right inguinal region and right lateral hip on palpation with normal hip flexion extension abduction and abduction. No bruising noted.).  Neurological: She is alert and oriented to person, place, and time. She displays normal reflexes.  Skin: No rash noted.  Psychiatric: She has a normal mood and affect.  Nursing note and vitals reviewed.    ED Treatments / Results  Labs (all labs ordered are listed, but only abnormal results are displayed) Labs Reviewed  COMPREHENSIVE METABOLIC PANEL - Abnormal; Notable for the following:       Result Value   Chloride 112 (*)    CO2 18 (*)    Creatinine, Ser 1.08 (*)    All other components within normal limits  CBC - Abnormal; Notable for the following:    Hemoglobin 11.0 (*)    MCV 72.4 (*)    MCH 21.9 (*)    RDW 21.6 (*)    All other components within normal limits  LIPASE, BLOOD  URINALYSIS, ROUTINE W REFLEX MICROSCOPIC  I-STAT BETA HCG BLOOD, ED ([facility], [facility], [facility] ONLY)    EKG  EKG Interpretation None       Radiology Dg Lumbar Spine Complete  Result Date: 02/19/2017 CLINICAL DATA:  Trip and fall today with low back pain, initial encounter EXAM: LUMBAR SPINE - COMPLETE 4+ VIEW COMPARISON:  None. FINDINGS: Five lumbar type vertebral bodies are well visualized. Vertebral body height is well maintained. No pars defects are seen. No anterolisthesis is noted. No soft tissue changes are seen. IMPRESSION: No acute abnormality noted. Electronically Signed   By: Alcide Clever M.D.   On: 02/19/2017 21:08   Dg Hip Unilat W Or Wo Pelvis 2-3 Views Right  Result Date: 02/19/2017 CLINICAL DATA:  Trip and fall with right hip pain, initial encounter EXAM: DG HIP (WITH OR WITHOUT PELVIS) 3V RIGHT COMPARISON:  None. FINDINGS: Pelvic ring is intact. No acute fracture or dislocation is noted. No soft tissue abnormality is seen. IMPRESSION:  No acute abnormality noted. Electronically Signed   By: Alcide Clever M.D.   On: 02/19/2017 21:08    Procedures Procedures (including critical care time)  Medications Ordered  in ED Medications  ondansetron (ZOFRAN-ODT) disintegrating tablet 4 mg (not administered)  cyclobenzaprine (FLEXERIL) tablet 5 mg (not administered)  traMADol (ULTRAM) tablet 50 mg (50 mg Oral Given 02/19/17 2026)     Initial Impression / Assessment and Plan / ED Course  I have reviewed the triage vital signs and the nursing notes.  Pertinent labs & imaging results that were available during my care of the patient were reviewed by me and considered in my medical decision making (see chart for details).     BP 128/85   Pulse 78   Temp 99.1 F (37.3 C) (Oral)   Resp 18   Ht 5\' 1"  (1.549 m)   Wt 86.2 kg (190 lb)   SpO2 100%   BMI 35.90 kg/m    Final Clinical Impressions(s) / ED Diagnoses   Final diagnoses:  Hip strain, initial encounter    New Prescriptions New Prescriptions   CYCLOBENZAPRINE (FLEXERIL) 10 MG TABLET    Take 1 tablet (10 mg total) by mouth 2 (two) times daily as needed for muscle spasms.   8:14 PM Patient had a mechanical fall after getting out from a bathtub and slipped on wet ground. She is not complaining of pain to her lower back and her right groin and hip. Plan to obtain x-ray to rule out acute fractures or dislocation. She does have history of opiate abuse, and was going through a Suboxone clinic. In this patient has a documented finding of acute fractures, plan to avoid opiate medication. I did offer ibuprofen however patient states she has history of esophageal bleed and esophageal varices and cannot tolerate NSAIDs. Will give tramadol.  9:32 PM Xray of Lspine, R hip and pelvis without acute fx/dislocation.  Pt able to ambulate.  Will d/c with flexeril.  zofran for nausea as needed.  Ortho referral given as needed.     Fayrene Helper, PA-C 02/19/17 2133    Cathren Laine,  MD 02/19/17 2312

## 2017-02-26 ENCOUNTER — Ambulatory Visit (INDEPENDENT_AMBULATORY_CARE_PROVIDER_SITE_OTHER): Payer: Self-pay | Admitting: Family

## 2017-03-02 ENCOUNTER — Emergency Department (HOSPITAL_COMMUNITY)
Admission: EM | Admit: 2017-03-02 | Discharge: 2017-03-02 | Disposition: A | Discharge status: Home / Self Care | Payer: Medicaid Other | Attending: Emergency Medicine | Admitting: Emergency Medicine

## 2017-03-02 ENCOUNTER — Encounter (HOSPITAL_COMMUNITY): Payer: Self-pay | Admitting: Emergency Medicine

## 2017-03-02 DIAGNOSIS — Y999 Unspecified external cause status: Secondary | ICD-10-CM | POA: Diagnosis not present

## 2017-03-02 DIAGNOSIS — S20161A Insect bite (nonvenomous) of breast, right breast, initial encounter: Secondary | ICD-10-CM | POA: Insufficient documentation

## 2017-03-02 DIAGNOSIS — J449 Chronic obstructive pulmonary disease, unspecified: Secondary | ICD-10-CM | POA: Insufficient documentation

## 2017-03-02 DIAGNOSIS — Y929 Unspecified place or not applicable: Secondary | ICD-10-CM | POA: Diagnosis not present

## 2017-03-02 DIAGNOSIS — Y939 Activity, unspecified: Secondary | ICD-10-CM | POA: Diagnosis not present

## 2017-03-02 DIAGNOSIS — F17290 Nicotine dependence, other tobacco product, uncomplicated: Secondary | ICD-10-CM | POA: Insufficient documentation

## 2017-03-02 DIAGNOSIS — I509 Heart failure, unspecified: Secondary | ICD-10-CM | POA: Diagnosis not present

## 2017-03-02 DIAGNOSIS — E119 Type 2 diabetes mellitus without complications: Secondary | ICD-10-CM | POA: Diagnosis not present

## 2017-03-02 DIAGNOSIS — Z794 Long term (current) use of insulin: Secondary | ICD-10-CM | POA: Diagnosis not present

## 2017-03-02 DIAGNOSIS — Z79899 Other long term (current) drug therapy: Secondary | ICD-10-CM | POA: Diagnosis not present

## 2017-03-02 DIAGNOSIS — W57XXXA Bitten or stung by nonvenomous insect and other nonvenomous arthropods, initial encounter: Secondary | ICD-10-CM | POA: Diagnosis not present

## 2017-03-02 MED ORDER — CEPHALEXIN 500 MG PO CAPS: 500.0000 mg | ORAL_CAPSULE | Freq: Three times a day (TID) | ORAL | 0 refills | Status: DC

## 2017-03-02 NOTE — ED Provider Notes (Signed)
[facility] DEPT Provider Note   CSN: [id number] Arrival date & time: 03/02/17  0248     History   Chief Complaint Chief Complaint  Patient presents with  . Insect Bite    HPI Shelby Hatfield is a 41 y.o. female.  HPI   41 year old female with history of bipolar, COPD, diabetes, drug-seeking behaviors presenting for evaluation of a recent bug bite. Patient states yesterday she was helping cleaning bushes and yards. She went to showering she noticed pain and swelling to her right nipple lesion which she believes comes from an insect bite. Patient now complaining of sharp burning pain to the affected area, moderate in intensity. Denies any associated fever, shortness of breath, nipple discharge. She is up-to-date with tetanus.  Past Medical History:  Diagnosis Date  . Adenotonsillar hypertrophy 03/2012   snores during sleep; denies apnea; states occ. wakes up coughing  . Anemia   . Anxiety   . Bipolar 1 disorder (HCC)   . 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 Depression   . Diabetes mellitus without complication (HCC)   . Drug-seeking behavior   . GERD (gastroesophageal reflux disease)   . GI bleed   . Heart failure (HCC)   . Hypercholesterolemia   . Migraines   . Obesity   . Pneumonia   . Wears dentures    upper denture    Patient Active Problem List   Diagnosis Date Noted  . Acute esophagitis   . Cameron ulcer   . Gastrointestinal hemorrhage 09/02/2016  . IDA (iron deficiency anemia) 06/30/2016  . Rectal bleeding 06/30/2016  . Chronic diarrhea 06/30/2016  . Gastroesophageal reflux disease with esophagitis   . Hiatal hernia   . Morbid obesity due to excess calories (HCC)   . Protein-calorie malnutrition, severe 06/02/2016  . Upper GI bleed 06/01/2016  . PUD (peptic ulcer disease) 06/01/2016  . Acute blood loss anemia 06/01/2016  . Acute GI bleeding 06/01/2016  . Abdominal pain, chronic,  epigastric 02/12/2016  . 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 Hematemesis 02/12/2016  . Pulmonary nodules 02/12/2016  . Acute on chronic diastolic heart failure (HCC) 01/21/2015  . 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 Nausea with vomiting 01/20/2015  . 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 SOB (shortness of breath) 01/19/2015  . Lower leg edema 01/19/2015  . Diarrhea   . Diabetes mellitus type 2 in obese (HCC)   . COPD (chronic obstructive pulmonary disease) (HCC) 08/18/2014  . COPD exacerbation (HCC) 08/18/2014  . Chest tightness 08/18/2014  . Shortness of breath   . Opiate addiction (HCC) 05/22/2014  . 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 Opiate dependence, continuous (HCC) 04/20/2013  . Benzodiazepine dependence (HCC) 04/20/2013    Past Surgical History:  Procedure Laterality Date  . APPENDECTOMY    . BIOPSY  02/14/2016   Procedure: BIOPSY;  Surgeon: West Bali, MD;  Location: [facility];  Service: Endoscopy;;  gastric biopsies  . CESAREAN SECTION  08/31/2001   total of  3  . CHOLECYSTECTOMY    . DILATION AND EVACUATION  04/09/2000  . ESOPHAGEAL DILATION N/A 09/03/2016   Procedure: ESOPHAGEAL DILATION;  Surgeon: Corbin Ade, MD;  Location: [facility];  Service: Endoscopy;  Laterality: N/A;  . ESOPHAGOGASTRODUODENOSCOPY (EGD) WITH PROPOFOL N/A 02/14/2016   Dr. Darrick Penna: Large hiatal hernia, nonbleeding cratered gastric ulcer. Multiple nonbleeding erosions in the gastric fundus.  Diffuse moderate inflammation, erosions, erythema of the gastric antrum. All biopsies benign with no evidence of H. pylori.  . ESOPHAGOGASTRODUODENOSCOPY (EGD) WITH PROPOFOL N/A 06/02/2016   Procedure: ESOPHAGOGASTRODUODENOSCOPY (EGD) WITH PROPOFOL;  Surgeon: Dorena Cookey, MD;  Location: [facility] ENDOSCOPY;  Service: Endoscopy;  Laterality: N/A;  . ESOPHAGOGASTRODUODENOSCOPY (EGD) WITH PROPOFOL N/A 09/03/2016   Procedure: ESOPHAGOGASTRODUODENOSCOPY  (EGD) WITH PROPOFOL;  Surgeon: Corbin Ade, MD;  Location: [facility];  Service: Endoscopy;  Laterality: N/A;  . HIATAL HERNIA REPAIR  10/2015   planning for repeat surgery in 1/18  . open paraesophageal hernia repair with gastroexy  09/2015   Dr. Francee Gentile  . TONSILLECTOMY     adenoidectomy  . TONSILLECTOMY AND ADENOIDECTOMY  04/12/2012   Procedure: TONSILLECTOMY AND ADENOIDECTOMY;  Surgeon: Darletta Moll, MD;  Location: [facility];  Service: ENT;  Laterality: Bilateral;  . TUBAL LIGATION  08/31/2001    OB History    Gravida Para Term Preterm AB Living   8 3 3   5 3    SAB TAB Ectopic Multiple Live Births   5               Home Medications    Prior to Admission medications   Medication Sig Start Date End Date Taking? Authorizing Provider  acetaminophen (TYLENOL) 500 MG tablet Take 1,500 mg by mouth every 6 (six) hours as needed for mild pain.    [provider]  albuterol (PROVENTIL HFA;VENTOLIN HFA) 108 (90 Base) MCG/ACT inhaler Inhale 2 puffs into the lungs every 6 (six) hours as needed for wheezing or shortness of breath. 08/21/16   Evon Slack, PA-C  budesonide-formoterol (SYMBICORT) 160-4.5 MCG/ACT inhaler Inhale 2 puffs into the lungs 2 (two) times daily.    [provider]  cyclobenzaprine (FLEXERIL) 10 MG tablet Take 1 tablet (10 mg total) by mouth 2 (two) times daily as needed for muscle spasms. 02/19/17   Fayrene Helper, PA-C  insulin aspart (NOVOLOG) 100 UNIT/ML injection Inject 0-15 Units into the skin 3 (three) times daily with meals. 09/04/16   Penny Pia, MD  levofloxacin (LEVAQUIN) 750 MG tablet Take 1 tablet (750 mg total) by mouth daily. 10/30/16   Gilda Crease, MD  ondansetron (ZOFRAN) 8 MG tablet Take 1 tablet (8 mg total) by mouth every 8 (eight) hours as needed for nausea or vomiting. 02/19/17   Fayrene Helper, PA-C  oseltamivir (TAMIFLU) 75 MG capsule Take 1 capsule (75 mg total) by mouth every 12 (twelve) hours. 10/30/16    Gilda Crease, MD  pantoprazole (PROTONIX) 40 MG tablet Take 40 mg by mouth 2 (two) times daily.    [provider]  promethazine (PHENERGAN) 25 MG tablet TAKE 1/2 TO 1 TABLET BY MOUTH EVERY 6 HOURS AS NEEDED FOR NAUSEA AND VOMITING 01/01/17   Gelene Mink, NP  sucralfate (CARAFATE) 1 g tablet Take 1 tablet (1 g total) by mouth every 6 (six) hours. 09/04/16   Penny Pia, MD    Family History Family History  Problem Relation Age of Onset  . Cancer Other   . Hypertension Mother   . Heart disease Mother   . Cancer Mother   . Cancer Maternal Aunt        not sure primary, in kidney and colon  . Cancer Maternal Uncle        lung cancer   . Stroke Maternal Grandfather     Social History Social History  Substance Use Topics  .  Smoking status: Current Some Day Smoker    Packs/day: 0.33    Years: 18.00    Types: E-cigarettes  . Smokeless tobacco: Never Used  . Alcohol use No     Allergies   Lidocaine viscous and Penicillins   Review of Systems Review of Systems  Constitutional: Negative for fever.  Gastrointestinal: Negative for abdominal pain.  Neurological: Negative for numbness.     Physical Exam Updated Vital Signs BP 112/68 (BP Location: Left Arm)   Pulse 87   Temp 98.3 F (36.8 C) (Oral)   Resp 20   Ht 5\' 2"  (1.575 m)   Wt 85.3 kg (188 lb)   LMP 02/16/2017   SpO2 100%   BMI 34.39 kg/m   Physical Exam  Constitutional: She appears well-developed and well-nourished. No distress.  HENT:  Head: Atraumatic.  Eyes: Conjunctivae are normal.  Neck: Neck supple.  Neurological: She is alert.  Skin: No rash noted.  Chaperone present during exam. Right breast: Small localized skin irritation measuring approximately 3 mm to areolar region superiorly with mild discomfort on palpation but no obvious abscess. Nipple inversion, chronic in nature. No discharge.  Psychiatric: She has a normal mood and affect.  Nursing note and vitals  reviewed.    ED Treatments / Results  Labs (all labs ordered are listed, but only abnormal results are displayed) Labs Reviewed - No data to display  EKG  EKG Interpretation None       Radiology No results found.  Procedures Procedures (including critical care time)  Medications Ordered in ED Medications - No data to display   Initial Impression / Assessment and Plan / ED Course  I have reviewed the triage vital signs and the nursing notes.  Pertinent labs & imaging results that were available during my care of the patient were reviewed by me and considered in my medical decision making (see chart for details).     BP 112/68 (BP Location: Left Arm)   Pulse 87   Temp 98.3 F (36.8 C) (Oral)   Resp 20   Ht 5\' 2"  (1.575 m)   Wt 85.3 kg (188 lb)   LMP 02/16/2017   SpO2 100%   BMI 34.39 kg/m    Final Clinical Impressions(s) / ED Diagnoses   Final diagnoses:  Insect bite, initial encounter    New Prescriptions New Prescriptions   CEPHALEXIN (KEFLEX) 500 MG CAPSULE    Take 1 capsule (500 mg total) by mouth 3 (three) times daily.   4:35 AM Patient here for evaluation of a potential insect bite. She has a localized skin irritation noted to her right breast near the area or region. It does not appears to be infected at this time. Recommend watchful waiting, using warm compress, and take over-the-counter medication for pain. Patient prescribed antibiotic only to use if she notices signs of cellulitis. Return precaution discussed.   Fayrene Helperran, Dequavious Harshberger, PA-C 03/02/17 [id number]    Gilda CreasePollina, Christopher J, MD 03/02/17 [phone]

## 2017-03-02 NOTE — Discharge Instructions (Signed)
You have a localized skin irritation from a potential insect bite. It does not appear infected at this time. If you noticed increasing pain and redness, take antibiotic as prescribed. If no improvement, return for further evaluation.

## 2017-03-02 NOTE — ED Triage Notes (Signed)
Pt to ED with complaint of bug bite to her right breast.  Pt st's very painful and is making her nauseated.

## 2017-05-19 ENCOUNTER — Emergency Department (HOSPITAL_COMMUNITY)
Admission: EM | Admit: 2017-05-19 | Discharge: 2017-05-20 | Disposition: A | Discharge status: Home / Self Care | Payer: Medicaid Other | Attending: Emergency Medicine | Admitting: Emergency Medicine

## 2017-05-19 ENCOUNTER — Emergency Department (HOSPITAL_COMMUNITY): Payer: Medicaid Other

## 2017-05-19 ENCOUNTER — Encounter: Payer: Self-pay | Admitting: Emergency Medicine

## 2017-05-19 DIAGNOSIS — F17290 Nicotine dependence, other tobacco product, uncomplicated: Secondary | ICD-10-CM | POA: Insufficient documentation

## 2017-05-19 DIAGNOSIS — N83201 Unspecified ovarian cyst, right side: Secondary | ICD-10-CM | POA: Insufficient documentation

## 2017-05-19 DIAGNOSIS — R109 Unspecified abdominal pain: Secondary | ICD-10-CM | POA: Diagnosis not present

## 2017-05-19 DIAGNOSIS — I5032 Chronic diastolic (congestive) heart failure: Secondary | ICD-10-CM | POA: Insufficient documentation

## 2017-05-19 DIAGNOSIS — R0989 Other specified symptoms and signs involving the circulatory and respiratory systems: Secondary | ICD-10-CM | POA: Diagnosis not present

## 2017-05-19 DIAGNOSIS — R1013 Epigastric pain: Secondary | ICD-10-CM | POA: Diagnosis present

## 2017-05-19 DIAGNOSIS — Z794 Long term (current) use of insulin: Secondary | ICD-10-CM | POA: Diagnosis not present

## 2017-05-19 DIAGNOSIS — K921 Melena: Secondary | ICD-10-CM | POA: Insufficient documentation

## 2017-05-19 DIAGNOSIS — E119 Type 2 diabetes mellitus without complications: Secondary | ICD-10-CM | POA: Diagnosis not present

## 2017-05-19 DIAGNOSIS — R112 Nausea with vomiting, unspecified: Secondary | ICD-10-CM | POA: Insufficient documentation

## 2017-05-19 DIAGNOSIS — I110 Hypertensive heart disease with heart failure: Secondary | ICD-10-CM | POA: Diagnosis not present

## 2017-05-19 DIAGNOSIS — J449 Chronic obstructive pulmonary disease, unspecified: Secondary | ICD-10-CM | POA: Insufficient documentation

## 2017-05-19 LAB — COMPREHENSIVE METABOLIC PANEL
ALT: 10 U/L — ABNORMAL LOW (ref 14–54)
ANION GAP: 7 (ref 5–15)
AST: 15 U/L (ref 15–41)
Albumin: 3.6 g/dL (ref 3.5–5.0)
Alkaline Phosphatase: 70 U/L (ref 38–126)
BILIRUBIN TOTAL: 0.3 mg/dL (ref 0.3–1.2)
BUN: 13 mg/dL (ref 6–20)
CALCIUM: 9.1 mg/dL (ref 8.9–10.3)
CO2: 23 mmol/L (ref 22–32)
Chloride: 110 mmol/L (ref 101–111)
Creatinine, Ser: 0.92 mg/dL (ref 0.44–1.00)
Glucose, Bld: 97 mg/dL (ref 65–99)
POTASSIUM: 3.9 mmol/L (ref 3.5–5.1)
Sodium: 140 mmol/L (ref 135–145)
TOTAL PROTEIN: 6 g/dL — AB (ref 6.5–8.1)

## 2017-05-19 LAB — URINALYSIS, ROUTINE W REFLEX MICROSCOPIC
Bilirubin Urine: NEGATIVE
Glucose, UA: NEGATIVE mg/dL
Hgb urine dipstick: NEGATIVE
KETONES UR: NEGATIVE mg/dL
LEUKOCYTES UA: NEGATIVE
NITRITE: NEGATIVE
PH: 5 (ref 5.0–8.0)
Protein, ur: NEGATIVE mg/dL
Specific Gravity, Urine: 1.018 (ref 1.005–1.030)

## 2017-05-19 LAB — I-STAT BETA HCG BLOOD, ED (MC, WL, AP ONLY)

## 2017-05-19 LAB — CBC
HEMATOCRIT: 34.5 % — AB (ref 36.0–46.0)
HEMOGLOBIN: 11.1 g/dL — AB (ref 12.0–15.0)
MCH: 24.8 pg — ABNORMAL LOW (ref 26.0–34.0)
MCHC: 32.2 g/dL (ref 30.0–36.0)
MCV: 77 fL — ABNORMAL LOW (ref 78.0–100.0)
Platelets: 256 10*3/uL (ref 150–400)
RBC: 4.48 MIL/uL (ref 3.87–5.11)
RDW: 16.9 % — ABNORMAL HIGH (ref 11.5–15.5)
WBC: 6.9 10*3/uL (ref 4.0–10.5)

## 2017-05-19 LAB — POC OCCULT BLOOD, ED: Fecal Occult Bld: NEGATIVE

## 2017-05-19 LAB — LIPASE, BLOOD: Lipase: 32 U/L (ref 11–51)

## 2017-05-19 MED ORDER — MORPHINE SULFATE (PF) 4 MG/ML IV SOLN
6.0000 mg | Freq: Once | INTRAVENOUS | Status: AC
  Administered 2017-05-19: 6 mg via INTRAVENOUS
  Filled 2017-05-19: qty 2

## 2017-05-19 MED ORDER — IOPAMIDOL (ISOVUE-300) INJECTION 61%
INTRAVENOUS | Status: AC
  Administered 2017-05-19: 100 mL
  Filled 2017-05-19: qty 100

## 2017-05-19 MED ORDER — SODIUM CHLORIDE 0.9 % IV BOLUS (SEPSIS)
1000.0000 mL | Freq: Once | INTRAVENOUS | Status: AC
  Administered 2017-05-20: 1000 mL via INTRAVENOUS

## 2017-05-19 MED ORDER — MORPHINE SULFATE (PF) 4 MG/ML IV SOLN
6.0000 mg | Freq: Once | INTRAVENOUS | Status: AC
  Administered 2017-05-20: 6 mg via INTRAVENOUS
  Filled 2017-05-19: qty 2

## 2017-05-19 MED ORDER — ONDANSETRON HCL 4 MG/2ML IJ SOLN
4.0000 mg | Freq: Once | INTRAMUSCULAR | Status: AC
  Administered 2017-05-19: 4 mg via INTRAVENOUS
  Filled 2017-05-19: qty 2

## 2017-05-19 MED ORDER — PANTOPRAZOLE SODIUM 40 MG IV SOLR
40.0000 mg | Freq: Once | INTRAVENOUS | Status: AC
  Administered 2017-05-19: 40 mg via INTRAVENOUS
  Filled 2017-05-19: qty 40

## 2017-05-19 NOTE — ED Provider Notes (Signed)
[facility]-EMERGENCY DEPT Provider Note   CSN: [id number] Arrival date & time: 05/19/17  2002     History   Chief Complaint Chief Complaint  Patient presents with  . Abdominal Pain    HPI Shelby Hatfield is a 41 y.o. female.  HPI Shelby Hatfield is a 41 y.o. femaleWith history of diabetes, hypertension, chronic pain, peptic ulcer disease, recurrent hiatal hernias, with thoracotomy one year ago at [facility], presents to emergency department with complaint of abdominal pain and feeling like food is getting stuck in her esophagus. Patient states that this pain started suddenly several hours ago. She reports severe pain in the center upper abdomen, the radiates "all over." She states that she has chronic abdominal pain that she gets whenever she has to have a bowel movement. She states that she tried to have a bowel movement but pain only worsened after this bowel movement. Denies any nausea vomiting. She reports dark black stools. She states that she tried to eat something to see if that will make pain go away, but feels like it's getting stuck in her esophagus.patient did have EGD done on 10/10/16 which showed severe esophagitis with a bleeding gastric ulcer and large amount of retained food. She states that this pain feels similar. No medications taken prior to coming in. Denies bright red blood in stool or emesis.   Past Medical History:  Diagnosis Date  . Adenotonsillar hypertrophy 03/2012   snores during sleep; denies apnea; states occ. wakes up coughing  . Anemia   . Anxiety   . Bipolar 1 disorder (HCC)   . 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 Depression   . Diabetes mellitus without complication (HCC)   . Drug-seeking behavior   . GERD (gastroesophageal reflux disease)   . GI bleed   . Heart failure (HCC)   . Hypercholesterolemia   . Migraines   . Obesity   . Pneumonia   . Wears dentures    upper denture    Patient  Active Problem List   Diagnosis Date Noted  . Acute esophagitis   . Cameron ulcer   . Gastrointestinal hemorrhage 09/02/2016  . IDA (iron deficiency anemia) 06/30/2016  . Rectal bleeding 06/30/2016  . Chronic diarrhea 06/30/2016  . Gastroesophageal reflux disease with esophagitis   . Hiatal hernia   . Morbid obesity due to excess calories (HCC)   . Protein-calorie malnutrition, severe 06/02/2016  . Upper GI bleed 06/01/2016  . PUD (peptic ulcer disease) 06/01/2016  . Acute blood loss anemia 06/01/2016  . Acute GI bleeding 06/01/2016  . Abdominal pain, chronic, epigastric 02/12/2016  . 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 Hematemesis 02/12/2016  . Pulmonary nodules 02/12/2016  . Acute on chronic diastolic heart failure (HCC) 01/21/2015  . 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 Nausea with vomiting 01/20/2015  . 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 SOB (shortness of breath) 01/19/2015  . Lower leg edema 01/19/2015  . Diarrhea   . Diabetes mellitus type 2 in obese (HCC)   . COPD (chronic obstructive pulmonary disease) (HCC) 08/18/2014  . COPD exacerbation (HCC) 08/18/2014  . Chest tightness 08/18/2014  . Shortness of breath   . Opiate addiction (HCC) 05/22/2014  . 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 Opiate dependence, continuous (HCC) 04/20/2013  . Benzodiazepine dependence (HCC) 04/20/2013    Past Surgical History:  Procedure Laterality Date  . APPENDECTOMY    .  BIOPSY  02/14/2016   Procedure: BIOPSY;  Surgeon: West Bali, MD;  Location: [facility];  Service: Endoscopy;;  gastric biopsies  . CESAREAN SECTION  08/31/2001   total of  3  . CHOLECYSTECTOMY    . DILATION AND EVACUATION  04/09/2000  . ESOPHAGEAL DILATION N/A 09/03/2016   Procedure: ESOPHAGEAL DILATION;  Surgeon: Corbin Ade, MD;  Location: [facility];  Service: Endoscopy;  Laterality: N/A;  . ESOPHAGOGASTRODUODENOSCOPY (EGD) WITH PROPOFOL N/A  02/14/2016   Dr. Darrick Penna: Large hiatal hernia, nonbleeding cratered gastric ulcer. Multiple nonbleeding erosions in the gastric fundus. Diffuse moderate inflammation, erosions, erythema of the gastric antrum. All biopsies benign with no evidence of H. pylori.  . ESOPHAGOGASTRODUODENOSCOPY (EGD) WITH PROPOFOL N/A 06/02/2016   Procedure: ESOPHAGOGASTRODUODENOSCOPY (EGD) WITH PROPOFOL;  Surgeon: Dorena Cookey, MD;  Location: [facility] ENDOSCOPY;  Service: Endoscopy;  Laterality: N/A;  . ESOPHAGOGASTRODUODENOSCOPY (EGD) WITH PROPOFOL N/A 09/03/2016   Procedure: ESOPHAGOGASTRODUODENOSCOPY (EGD) WITH PROPOFOL;  Surgeon: Corbin Ade, MD;  Location: [facility];  Service: Endoscopy;  Laterality: N/A;  . HIATAL HERNIA REPAIR  10/2015   planning for repeat surgery in 1/18  . open paraesophageal hernia repair with gastroexy  09/2015   Dr. Francee Gentile  . TONSILLECTOMY     adenoidectomy  . TONSILLECTOMY AND ADENOIDECTOMY  04/12/2012   Procedure: TONSILLECTOMY AND ADENOIDECTOMY;  Surgeon: Darletta Moll, MD;  Location: [facility];  Service: ENT;  Laterality: Bilateral;  . TUBAL LIGATION  08/31/2001    OB History    Gravida Para Term Preterm AB Living   8 3 3   5 3    SAB TAB Ectopic Multiple Live Births   5               Home Medications    Prior to Admission medications   Medication Sig Start Date End Date Taking? Authorizing Provider  albuterol (PROVENTIL HFA;VENTOLIN HFA) 108 (90 Base) MCG/ACT inhaler Inhale 2 puffs into the lungs every 6 (six) hours as needed for wheezing or shortness of breath. 08/21/16  Yes Evon Slack, PA-C  budesonide-formoterol (SYMBICORT) 160-4.5 MCG/ACT inhaler Inhale 2 puffs into the lungs 2 (two) times daily.   Yes [provider]  gabapentin (NEURONTIN) 300 MG capsule Take 600 mg by mouth 3 (three) times daily.   Yes [provider]  pantoprazole (PROTONIX) 40 MG tablet Take 40 mg by mouth 2 (two) times daily.   Yes [provider]    sertraline (ZOLOFT) 100 MG tablet Take 100 mg by mouth daily.   Yes [provider]  sucralfate (CARAFATE) 1 g tablet Take 1 tablet (1 g total) by mouth every 6 (six) hours. 09/04/16  Yes Penny Pia, MD  cephALEXin (KEFLEX) 500 MG capsule Take 1 capsule (500 mg total) by mouth 3 (three) times daily. Patient not taking: Reported on 05/19/2017 03/02/17   Fayrene Helper, PA-C  cyclobenzaprine (FLEXERIL) 10 MG tablet Take 1 tablet (10 mg total) by mouth 2 (two) times daily as needed for muscle spasms. Patient not taking: Reported on 05/19/2017 02/19/17   Fayrene Helper, PA-C  insulin aspart (NOVOLOG) 100 UNIT/ML injection Inject 0-15 Units into the skin 3 (three) times daily with meals. Patient not taking: Reported on 05/19/2017 09/04/16   Penny Pia, MD  levofloxacin (LEVAQUIN) 750 MG tablet Take 1 tablet (750 mg total) by mouth daily. Patient not taking: Reported on 05/19/2017 10/30/16   Gilda Crease, MD  ondansetron (ZOFRAN) 8 MG tablet Take  1 tablet (8 mg total) by mouth every 8 (eight) hours as needed for nausea or vomiting. Patient not taking: Reported on 05/19/2017 02/19/17   Fayrene Helper, PA-C  oseltamivir (TAMIFLU) 75 MG capsule Take 1 capsule (75 mg total) by mouth every 12 (twelve) hours. Patient not taking: Reported on 05/19/2017 10/30/16   Gilda Crease, MD  promethazine (PHENERGAN) 25 MG tablet TAKE 1/2 TO 1 TABLET BY MOUTH EVERY 6 HOURS AS NEEDED FOR NAUSEA AND VOMITING Patient not taking: Reported on 05/19/2017 01/01/17   Gelene Mink, NP    Family History Family History  Problem Relation Age of Onset  . Cancer Other   . Hypertension Mother   . Heart disease Mother   . Cancer Mother   . Cancer Maternal Aunt        not sure primary, in kidney and colon  . Cancer Maternal Uncle        lung cancer   . Stroke Maternal Grandfather     Social History Social History  Substance Use Topics  . Smoking status: Current Some Day Smoker    Packs/day: 0.33    Years: 18.00     Types: E-cigarettes  . Smokeless tobacco: Never Used  . Alcohol use No     Allergies   Lidocaine viscous and Penicillins   Review of Systems Review of Systems  Constitutional: Negative for chills and fever.  Respiratory: Negative for cough, chest tightness and shortness of breath.   Cardiovascular: Negative for chest pain, palpitations and leg swelling.  Gastrointestinal: Positive for abdominal pain, nausea and vomiting. Negative for diarrhea.  Genitourinary: Negative for dysuria.  Musculoskeletal: Negative for arthralgias, myalgias, neck pain and neck stiffness.  Skin: Negative for rash.  Neurological: Negative for dizziness, weakness and headaches.  All other systems reviewed and are negative.    Physical Exam Updated Vital Signs BP (!) 98/57   Pulse 62   Temp 98.1 F (36.7 C) (Oral)   Ht 5\' 1"  (1.549 m)   Wt 86.2 kg (190 lb)   SpO2 98%   BMI 35.90 kg/m   Physical Exam  Constitutional: She appears well-developed and well-nourished. No distress.  HENT:  Head: Normocephalic.  Eyes: Conjunctivae are normal.  Neck: Neck supple.  Cardiovascular: Normal rate, regular rhythm and normal heart sounds.   Pulmonary/Chest: Effort normal and breath sounds normal. No respiratory distress. She has no wheezes. She has no rales.  Abdominal: Soft. Bowel sounds are normal. She exhibits no distension. There is tenderness. There is no rebound.  Diffuse tenderness  Musculoskeletal: She exhibits no edema.  Neurological: She is alert.  Skin: Skin is warm and dry.  Psychiatric: She has a normal mood and affect. Her behavior is normal.  Nursing note and vitals reviewed.    ED Treatments / Results  Labs (all labs ordered are listed, but only abnormal results are displayed) Labs Reviewed  COMPREHENSIVE METABOLIC PANEL - Abnormal; Notable for the following:       Result Value   Total Protein 6.0 (*)    ALT 10 (*)    All other components within normal limits  CBC - Abnormal;  Notable for the following:    Hemoglobin 11.1 (*)    HCT 34.5 (*)    MCV 77.0 (*)    MCH 24.8 (*)    RDW 16.9 (*)    All other components within normal limits  LIPASE, BLOOD  URINALYSIS, ROUTINE W REFLEX MICROSCOPIC  I-STAT BETA HCG BLOOD, ED ([facility], [facility], [facility]  ONLY)  POC OCCULT BLOOD, ED    EKG  EKG Interpretation None       Radiology No results found.  Procedures Procedures (including critical care time)  Medications Ordered in ED Medications  morphine 4 MG/ML injection 6 mg (not administered)  ondansetron (ZOFRAN) injection 4 mg (not administered)  pantoprazole (PROTONIX) injection 40 mg (not administered)     Initial Impression / Assessment and Plan / ED Course  I have reviewed the triage vital signs and the nursing notes.  Pertinent labs & imaging results that were available during my care of the patient were reviewed by me and considered in my medical decision making (see chart for details).     Pt in ED with recurrent pain in her abdomen. Hx of the same. Followed by [facility]. Care everywhere reviewed. Hx or recurrent hiatal hernias as well as esophagitis and PUD. Will perform rectal exam. Labs all normal. CT ordered. Pt had CT just a week ago at [facility] that was unchanged. Pain medications and fluids ordered.    11:53 PM Pt's CT is negative. Hemoccult negative with brown stool. Highly doubt bleeding ulcers or any other acute process. Pt still having pain. Will give more pain medications and reasess. Question chronic pain.   12:55 AM Pt feels better after 2nd dose of pain med. Requesting prescription for pain. Explained she would need to call her doctor tomorrow expecially if pain continues to be this severe. Also will give follow up with OB/GYN for ovarian cyst. She states she does not have a PCP or OB/gyn. I do not think this cyst is what is causing her pain. Return precautions discussed.   Vitals:   05/19/17 2010 05/19/17 2015 05/19/17 2030 05/19/17 2045  BP:  (!)  106/59 (!) 106/58 (!) 98/57  Pulse:  74 68 62  Temp:      TempSrc:      SpO2:  99% 99% 98%  Weight: 86.2 kg (190 lb)     Height: 5\' 1"  (1.549 m)        Final Clinical Impressions(s) / ED Diagnoses   Final diagnoses:  Epigastric pain  Cyst of right ovary    New Prescriptions New Prescriptions   No medications on file     Jaynie Crumble, Cordelia Poche 05/20/17 0056    Arby Barrette, MD 05/26/17 [phone]

## 2017-05-19 NOTE — ED Triage Notes (Signed)
Pt from home via [facility]. Pt in w/ c/o sudden upper abd pain x2hrs ago and has been progressively getting worse. Endorses vomiting x3 & nausea. Given Fentanyl, 4mg  Zofran PTA by [facility]. Rates pain @ 9/10. Hx of x2 hernia surgeries. A&O x4.

## 2017-05-20 ENCOUNTER — Inpatient Hospital Stay (EMERGENCY_DEPARTMENT_HOSPITAL)
Admission: AD | Admit: 2017-05-20 | Discharge: 2017-05-20 | Disposition: A | Discharge status: Home / Self Care | Payer: Medicaid Other | Source: Ambulatory Visit | Attending: Obstetrics & Gynecology | Admitting: Obstetrics & Gynecology

## 2017-05-20 ENCOUNTER — Encounter (HOSPITAL_COMMUNITY): Payer: Self-pay | Admitting: *Deleted

## 2017-05-20 ENCOUNTER — Inpatient Hospital Stay (HOSPITAL_COMMUNITY): Payer: Medicaid Other

## 2017-05-20 DIAGNOSIS — R1031 Right lower quadrant pain: Secondary | ICD-10-CM

## 2017-05-20 DIAGNOSIS — N83209 Unspecified ovarian cyst, unspecified side: Secondary | ICD-10-CM

## 2017-05-20 DIAGNOSIS — N83201 Unspecified ovarian cyst, right side: Secondary | ICD-10-CM

## 2017-05-20 DIAGNOSIS — R109 Unspecified abdominal pain: Secondary | ICD-10-CM

## 2017-05-20 LAB — WET PREP, GENITAL
Clue Cells Wet Prep HPF POC: NONE SEEN
Sperm: NONE SEEN
Trich, Wet Prep: NONE SEEN
YEAST WET PREP: NONE SEEN

## 2017-05-20 LAB — CBC WITH DIFFERENTIAL/PLATELET
Basophils Absolute: 0 10*3/uL (ref 0.0–0.1)
Basophils Relative: 0 %
Eosinophils Absolute: 0.2 10*3/uL (ref 0.0–0.7)
Eosinophils Relative: 3 %
HEMATOCRIT: 34.6 % — AB (ref 36.0–46.0)
HEMOGLOBIN: 11.4 g/dL — AB (ref 12.0–15.0)
LYMPHS ABS: 2.6 10*3/uL (ref 0.7–4.0)
LYMPHS PCT: 39 %
MCH: 26.1 pg (ref 26.0–34.0)
MCHC: 32.9 g/dL (ref 30.0–36.0)
MCV: 79.4 fL (ref 78.0–100.0)
MONO ABS: 0.2 10*3/uL (ref 0.1–1.0)
MONOS PCT: 3 %
NEUTROS ABS: 3.6 10*3/uL (ref 1.7–7.7)
NEUTROS PCT: 55 %
Platelets: 247 10*3/uL (ref 150–400)
RBC: 4.36 MIL/uL (ref 3.87–5.11)
RDW: 17.4 % — AB (ref 11.5–15.5)
WBC: 6.7 10*3/uL (ref 4.0–10.5)

## 2017-05-20 LAB — URINALYSIS, ROUTINE W REFLEX MICROSCOPIC
BILIRUBIN URINE: NEGATIVE
Glucose, UA: NEGATIVE mg/dL
HGB URINE DIPSTICK: NEGATIVE
Ketones, ur: NEGATIVE mg/dL
Leukocytes, UA: NEGATIVE
NITRITE: NEGATIVE
PROTEIN: NEGATIVE mg/dL
SPECIFIC GRAVITY, URINE: 1.012 (ref 1.005–1.030)
pH: 5 (ref 5.0–8.0)

## 2017-05-20 LAB — POCT PREGNANCY, URINE: PREG TEST UR: NEGATIVE

## 2017-05-20 MED ORDER — TRAMADOL HCL 50 MG PO TABS
100.0000 mg | ORAL_TABLET | Freq: Once | ORAL | Status: AC
  Administered 2017-05-20: 100 mg via ORAL
  Filled 2017-05-20: qty 2

## 2017-05-20 MED ORDER — ONDANSETRON 8 MG PO TBDP
8.0000 mg | ORAL_TABLET | Freq: Once | ORAL | Status: AC
  Administered 2017-05-20: 8 mg via ORAL
  Filled 2017-05-20: qty 1

## 2017-05-20 MED ORDER — ONDANSETRON HCL 8 MG PO TABS: 8.0000 mg | ORAL_TABLET | Freq: Three times a day (TID) | ORAL | 1 refills | Status: DC | PRN

## 2017-05-20 NOTE — MAU Note (Addendum)
Has appointment in clinic on the 10th Was seen last night at [facility] [address] and was told had a right ovarian cyst --states had a ct scan  Still having "severe" right lower abdominal pain--sharp and constant Has not taken anything for the pain--did take phenergan for nausea Rating pain 10/10

## 2017-05-20 NOTE — MAU Provider Note (Signed)
Chief Complaint: Abdominal Pain   First Provider Initiated Contact with Patient 05/20/17 1411     SUBJECTIVE HPI: Shelby Hatfield is a 41 y.o. [id number] non-pregnant female who presents to Maternity Admissions reporting severe RLQ pain since yesterday. Was seen at [facility] ED yesterday for this problem, except described the pain as central and upper yesterday. Had CT showing 4.5 cm cyst. No US done to eval for torsion. Pt also has an extensive Hx of GI problems and surgeries and does not have PCP or GI Dr. Has had appendectomy.   Location: RLQ, right side  Quality: sharp Severity: 9/10 on pain scale Duration: 36 hours Context: None Timing: constant Modifying factors: Improved temporarily w/meds at ED (Morphone, pepcid) Associated signs and symptoms: Pos for new nausea. Neg for fever, chills   Past Medical History:  Diagnosis Date  . Adenotonsillar hypertrophy 03/2012   snores during sleep; denies apnea; states occ. wakes up coughing  . Anemia   . Anxiety   . Bipolar 1 disorder (HCC)   . 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 Depression   . Diabetes mellitus without complication (HCC)   . Drug-seeking behavior   . GERD (gastroesophageal reflux disease)   . GI bleed   . Heart failure (HCC)   . Hypercholesterolemia   . Migraines   . Obesity   . Pneumonia   . Wears dentures    upper denture   OB History  Gravida Para Term Preterm AB Living  8 3 3   5 3   SAB TAB Ectopic Multiple Live Births  5            # Outcome Date GA Lbr Len/2nd Weight Sex Delivery Anes PTL Lv  8 SAB           7 SAB           6 SAB           5 SAB           4 SAB           3 Term           2 Term           1 Term              Past Surgical History:  Procedure Laterality Date  . APPENDECTOMY    . BIOPSY  02/14/2016   Procedure: BIOPSY;  Surgeon: West Bali, MD;  [facility];  Service: Endoscopy;;  gastric biopsies  .  CESAREAN SECTION  08/31/2001   total of  3  . CHOLECYSTECTOMY    . DILATION AND EVACUATION  04/09/2000  . ESOPHAGEAL DILATION N/A 09/03/2016   Procedure: ESOPHAGEAL DILATION;  Surgeon: Corbin Ade, MD;  [facility];  Service: Endoscopy;  Laterality: N/A;  . ESOPHAGOGASTRODUODENOSCOPY (EGD) WITH PROPOFOL N/A 02/14/2016   Dr. Darrick Penna: Large hiatal hernia, nonbleeding cratered gastric ulcer. Multiple nonbleeding erosions in the gastric fundus. Diffuse moderate inflammation, erosions, erythema of the gastric antrum. All biopsies benign with no evidence of H. pylori.  . ESOPHAGOGASTRODUODENOSCOPY (EGD) WITH PROPOFOL N/A 06/02/2016   Procedure: ESOPHAGOGASTRODUODENOSCOPY (EGD) WITH PROPOFOL;  Surgeon: Dorena Cookey, MD;  Location: [facility] ENDOSCOPY;  Service: Endoscopy;  Laterality: N/A;  . ESOPHAGOGASTRODUODENOSCOPY (EGD) WITH PROPOFOL N/A 09/03/2016   Procedure: ESOPHAGOGASTRODUODENOSCOPY (EGD) WITH PROPOFOL;  Surgeon: Corbin Ade, MD;  [facility];  Service: Endoscopy;  Laterality: N/A;  . HIATAL HERNIA REPAIR  10/2015   planning for repeat surgery in 1/18  . open paraesophageal hernia repair with gastroexy  09/2015   Dr. Francee Gentile  . TONSILLECTOMY     adenoidectomy  . TONSILLECTOMY AND ADENOIDECTOMY  04/12/2012   Procedure: TONSILLECTOMY AND ADENOIDECTOMY;  Surgeon: Darletta Moll, MD;  Location: [facility];  Service: ENT;  Laterality: Bilateral;  . TUBAL LIGATION  08/31/2001   Social History   Social History  . Marital status: Divorced    Spouse name: N/A  . Number of children: 3  . Years of education: N/A   Occupational History  . unemployed    Social History Main Topics  . Smoking status: Current Some Day Smoker    Packs/day: 0.33    Years: 18.00    Types: E-cigarettes  . Smokeless tobacco: Never Used  . Alcohol use No  . Drug use: Yes    Types: Marijuana     Comment: history of cocaine in 2002; "hit some weed over Thanksgiving"  . Sexual activity:  Yes    Birth control/ protection: Surgical   Other Topics Concern  . Not on file   Social History Narrative  . No narrative on file   Family History  Problem Relation Age of Onset  . Cancer Other   . Hypertension Mother   . Heart disease Mother   . Cancer Mother   . Cancer Maternal Aunt        not sure primary, in kidney and colon  . Cancer Maternal Uncle        lung cancer   . Stroke Maternal Grandfather    No current facility-administered medications on file prior to encounter.    Current Outpatient Prescriptions on File Prior to Encounter  Medication Sig Dispense Refill  . albuterol (PROVENTIL HFA;VENTOLIN HFA) 108 (90 Base) MCG/ACT inhaler Inhale 2 puffs into the lungs every 6 (six) hours as needed for wheezing or shortness of breath. 1 Inhaler 2  . budesonide-formoterol (SYMBICORT) 160-4.5 MCG/ACT inhaler Inhale 2 puffs into the lungs 2 (two) times daily.    Marland Kitchen gabapentin (NEURONTIN) 300 MG capsule Take 600 mg by mouth 3 (three) times daily.    . pantoprazole (PROTONIX) 40 MG tablet Take 40 mg by mouth 2 (two) times daily.    . sertraline (ZOLOFT) 100 MG tablet Take 100 mg by mouth daily.    . sucralfate (CARAFATE) 1 g tablet Take 1 tablet (1 g total) by mouth every 6 (six) hours.    . insulin aspart (NOVOLOG) 100 UNIT/ML injection Inject 0-15 Units into the skin 3 (three) times daily with meals. (Patient not taking: Reported on 05/19/2017) 10 mL 11   Allergies  Allergen Reactions  . Lidocaine Viscous Other (See Comments)    Created BLISTERS in the mouth  . Penicillins Itching and Rash    Has patient had a PCN reaction causing immediate rash, facial/tongue/throat swelling, SOB or lightheadedness with hypotension: Yes Has patient had a PCN reaction causing severe rash involving mucus membranes or skin necrosis: No Has patient had a PCN reaction that required hospitalization No Has patient had a PCN reaction occurring within the last 10 years: No If all of the above answers  are "NO", then may proceed with Cephalosporin use.     I have reviewed patient's Past Medical Hx, Surgical Hx, Family Hx, Social Hx, medications and allergies.   Review of Systems  Constitutional: Negative for appetite change,  chills and fever.  Gastrointestinal: Positive for abdominal pain and nausea. Negative for abdominal distention, constipation, diarrhea and vomiting.  Genitourinary: Negative for dysuria, frequency, hematuria, urgency, vaginal bleeding and vaginal discharge.  Musculoskeletal: Negative for back pain.    OBJECTIVE Patient Vitals for the past 24 hrs:  BP Temp Temp src Pulse Resp SpO2 Weight  05/20/17 1658 (!) 92/53 - - (!) 57 17 100 % -  05/20/17 1317 (!) 97/51 98.1 F (36.7 C) Oral 60 17 100 % 197 lb 1.9 oz (89.4 kg)   Constitutional: Well-developed, well-nourished female in mild-mod distress.  Cardiovascular: normal rate Respiratory: normal rate and effort.  GI: Abd soft, mild non-focal RLQ pain. Pos BS x 4 MS: Extremities nontender, no edema, normal ROM Neurologic: Alert and oriented x 4.  GU: Neg CVAT.  BIMANUAL EXAM: NEFG, physiologic discharge, no blood noted, cervix closed, uterus normal size, Small, tender, mobile right adnexal mass, no left adnexal tenderness or mass. No CMT.  LAB RESULTS Results for orders placed or performed during the hospital encounter of 05/20/17 (from the past 24 hour(s))  Urinalysis, Routine w reflex microscopic     Status: Abnormal   Collection Time: 05/20/17  1:12 PM  Result Value Ref Range   Color, Urine STRAW (A) YELLOW   APPearance CLEAR CLEAR   Specific Gravity, Urine 1.012 1.005 - 1.030   pH 5.0 5.0 - 8.0   Glucose, UA NEGATIVE NEGATIVE mg/dL   Hgb urine dipstick NEGATIVE NEGATIVE   Bilirubin Urine NEGATIVE NEGATIVE   Ketones, ur NEGATIVE NEGATIVE mg/dL   Protein, ur NEGATIVE NEGATIVE mg/dL   Nitrite NEGATIVE NEGATIVE   Leukocytes, UA NEGATIVE NEGATIVE  Pregnancy, urine POC     Status: None   Collection Time:  05/20/17  2:03 PM  Result Value Ref Range   Preg Test, Ur NEGATIVE NEGATIVE  Wet prep, genital     Status: Abnormal   Collection Time: 05/20/17  2:30 PM  Result Value Ref Range   Yeast Wet Prep HPF POC NONE SEEN NONE SEEN   Trich, Wet Prep NONE SEEN NONE SEEN   Clue Cells Wet Prep HPF POC NONE SEEN NONE SEEN   WBC, Wet Prep HPF POC FEW (A) NONE SEEN   Sperm NONE SEEN   CBC with Differential/Platelet     Status: Abnormal   Collection Time: 05/20/17  2:50 PM  Result Value Ref Range   WBC 6.7 4.0 - 10.5 K/uL   RBC 4.36 3.87 - 5.11 MIL/uL   Hemoglobin 11.4 (L) 12.0 - 15.0 g/dL   HCT 16.1 (L) 09.6 - 04.5 %   MCV 79.4 78.0 - 100.0 fL   MCH 26.1 26.0 - 34.0 pg   MCHC 32.9 30.0 - 36.0 g/dL   RDW 40.9 (H) 81.1 - 91.4 %   Platelets 247 150 - 400 K/uL   Neutrophils Relative % 55 %   Neutro Abs 3.6 1.7 - 7.7 K/uL   Lymphocytes Relative 39 %   Lymphs Abs 2.6 0.7 - 4.0 K/uL   Monocytes Relative 3 %   Monocytes Absolute 0.2 0.1 - 1.0 K/uL   Eosinophils Relative 3 %   Eosinophils Absolute 0.2 0.0 - 0.7 K/uL   Basophils Relative 0 %   Basophils Absolute 0.0 0.0 - 0.1 K/uL    IMAGING US Transvaginal Non-ob  Result Date: 05/20/2017 CLINICAL DATA:  Right lower quadrant pain. Right adnexal cystic lesion on recent CT. Clinical suspicion for ovarian torsion. EXAM: TRANSABDOMINAL AND TRANSVAGINAL ULTRASOUND OF PELVIS DOPPLER ULTRASOUND  OF OVARIES TECHNIQUE: Both transabdominal and transvaginal ultrasound examinations of the pelvis were performed. Transabdominal technique was performed for global imaging of the pelvis including uterus, ovaries, adnexal regions, and pelvic cul-de-sac. It was necessary to proceed with endovaginal exam following the transabdominal exam to visualize the endometrium and right adnexal cystic lesion. Color and duplex Doppler ultrasound was utilized to evaluate blood flow to the ovaries. COMPARISON:  CT on 05/19/2017 FINDINGS: Uterus Measurements: 9.6 x 4.6 x 5.4 cm. No  fibroids or other mass visualized. Endometrium Thickness: 12 mm.  No focal abnormality visualized. Right ovary Measurements: 5.1 x 3.1 x 3.4 cm. A mature follicular cyst is seen in the right ovary measuring 4.0 x 2.7 x 2.5 cm. Left ovary Measurements: 2.9 x 1.6 x 1.5 cm. Normal appearance/no adnexal mass. Pulsed Doppler evaluation of both ovaries demonstrates normal low-resistance arterial and venous waveforms. Other findings No abnormal free fluid. IMPRESSION: 4 cm right ovarian follicular cyst. No mass or other significant abnormality identified. No sonographic evidence for ovarian torsion. Electronically Signed   By: Myles Rosenthal M.D.   On: 05/20/2017 16:05   US Pelvis Complete  Result Date: 05/20/2017 CLINICAL DATA:  Right lower quadrant pain. Right adnexal cystic lesion on recent CT. Clinical suspicion for ovarian torsion. EXAM: TRANSABDOMINAL AND TRANSVAGINAL ULTRASOUND OF PELVIS DOPPLER ULTRASOUND OF OVARIES TECHNIQUE: Both transabdominal and transvaginal ultrasound examinations of the pelvis were performed. Transabdominal technique was performed for global imaging of the pelvis including uterus, ovaries, adnexal regions, and pelvic cul-de-sac. It was necessary to proceed with endovaginal exam following the transabdominal exam to visualize the endometrium and right adnexal cystic lesion. Color and duplex Doppler ultrasound was utilized to evaluate blood flow to the ovaries. COMPARISON:  CT on 05/19/2017 FINDINGS: Uterus Measurements: 9.6 x 4.6 x 5.4 cm. No fibroids or other mass visualized. Endometrium Thickness: 12 mm.  No focal abnormality visualized. Right ovary Measurements: 5.1 x 3.1 x 3.4 cm. A mature follicular cyst is seen in the right ovary measuring 4.0 x 2.7 x 2.5 cm. Left ovary Measurements: 2.9 x 1.6 x 1.5 cm. Normal appearance/no adnexal mass. Pulsed Doppler evaluation of both ovaries demonstrates normal low-resistance arterial and venous waveforms. Other findings No abnormal free fluid.  IMPRESSION: 4 cm right ovarian follicular cyst. No mass or other significant abnormality identified. No sonographic evidence for ovarian torsion. Electronically Signed   By: Myles Rosenthal M.D.   On: 05/20/2017 16:05   Ct Abdomen Pelvis W Contrast  Result Date: 05/19/2017 CLINICAL DATA:  Acute generalized abdominal pain. EXAM: CT ABDOMEN AND PELVIS WITH CONTRAST TECHNIQUE: Multidetector CT imaging of the abdomen and pelvis was performed using the standard protocol following bolus administration of intravenous contrast. CONTRAST:  ISOVUE-300 IOPAMIDOL (ISOVUE-300) INJECTION 61% COMPARISON:  CT scan of June 01, 2016. FINDINGS: Lower chest: No acute abnormality. Hepatobiliary: No focal liver abnormality is seen. Status post cholecystectomy. No biliary dilatation. Pancreas: Unremarkable. No pancreatic ductal dilatation or surrounding inflammatory changes. Spleen: Normal in size without focal abnormality. Adrenals/Urinary Tract: Adrenal glands are unremarkable. Kidneys are normal, without renal calculi, focal lesion, or hydronephrosis. Bladder is unremarkable. Stomach/Bowel: Status post hiatal hernia repair. No evidence of bowel obstruction or inflammation. Status post appendectomy. Vascular/Lymphatic: No significant vascular findings are present. No enlarged abdominal or pelvic lymph nodes. Reproductive: Uterus and left ovary appear normal. 4.6 cm right ovarian cyst is noted. Other: Moderate size fat containing periumbilical hernia is noted. No abnormal fluid collection is noted. Musculoskeletal: No acute or significant osseous findings. IMPRESSION: Moderate  size fat containing periumbilical hernia. 4.6 cm right ovarian cyst. No other significant abnormality seen in the abdomen or pelvis. Electronically Signed   By: Lupita Raider, M.D.   On: 05/19/2017 23:37   [place] Art/ven Flow Abd Pelv Doppler Limited  Result Date: 05/20/2017 CLINICAL DATA:  Right lower quadrant pain. Right adnexal cystic lesion on  recent CT. Clinical suspicion for ovarian torsion. EXAM: TRANSABDOMINAL AND TRANSVAGINAL ULTRASOUND OF PELVIS DOPPLER ULTRASOUND OF OVARIES TECHNIQUE: Both transabdominal and transvaginal ultrasound examinations of the pelvis were performed. Transabdominal technique was performed for global imaging of the pelvis including uterus, ovaries, adnexal regions, and pelvic cul-de-sac. It was necessary to proceed with endovaginal exam following the transabdominal exam to visualize the endometrium and right adnexal cystic lesion. Color and duplex Doppler ultrasound was utilized to evaluate blood flow to the ovaries. COMPARISON:  CT on 05/19/2017 FINDINGS: Uterus Measurements: 9.6 x 4.6 x 5.4 cm. No fibroids or other mass visualized. Endometrium Thickness: 12 mm.  No focal abnormality visualized. Right ovary Measurements: 5.1 x 3.1 x 3.4 cm. A mature follicular cyst is seen in the right ovary measuring 4.0 x 2.7 x 2.5 cm. Left ovary Measurements: 2.9 x 1.6 x 1.5 cm. Normal appearance/no adnexal mass. Pulsed Doppler evaluation of both ovaries demonstrates normal low-resistance arterial and venous waveforms. Other findings No abnormal free fluid. IMPRESSION: 4 cm right ovarian follicular cyst. No mass or other significant abnormality identified. No sonographic evidence for ovarian torsion. Electronically Signed   By: Myles Rosenthal M.D.   On: 05/20/2017 16:05    MAU COURSE Orders Placed This Encounter  Procedures  . Wet prep, genital  . [place] Art/Ven Flow Abd Pelv Doppler Limited  . US Pelvis Complete  . US Transvaginal Non-OB  . Urinalysis, Routine w reflex microscopic  . HIV antibody (routine testing) (NOT for [facility])  . CBC with Differential/Platelet  . Hepatitis C antibody  . Pregnancy, urine POC  . Discharge patient   Meds ordered this encounter  Medications  . ondansetron (ZOFRAN-ODT) disintegrating tablet 8 mg  . traMADol (ULTRAM) tablet 100 mg  . ondansetron (ZOFRAN) 8 MG tablet    Sig: Take 1 tablet (8 mg  total) by mouth every 8 (eight) hours as needed for nausea or vomiting.    Dispense:  20 tablet    Refill:  1    Order Specific Question:   Supervising Provider    Answer:   Adam Phenix [[id number]]    MDM - Acute RLQ likely 2/2 simple cyst w/out evidence of torsion. Pt non-tonic-appearing.  - Chronic GI abd pain. Tolerating PO's.  ASSESSMENT 1. Simple ovarian cyst   2. Ovarian cyst   3. Abdominal pain   4. Cyst of right ovary   5. Right lower quadrant abdominal pain     PLAN Discharge home in stable condition. Abd pain precautions Strongly encouraged pt to F/U in office for chronic GI problems.  Follow-up Information    [facility] Follow up on 06/07/2017.   Specialty:  Obstetrics and Gynecology Contact information: [address] [place] [address] [phone]       [facility] EMERGENCY DEPARTMENT Follow up.   Specialty:  Emergency Medicine Why:  in emergencies Contact information: [address] [id number] mc [place] [address] [phone]       Primary care provider or gastroenterologist Follow up.   Why:  for chronic abdominal pain         Allergies as  of 05/20/2017      Reactions   Lidocaine Viscous Other (See Comments)   Created BLISTERS in the mouth   Penicillins Itching, Rash   Has patient had a PCN reaction causing immediate rash, facial/tongue/throat swelling, SOB or lightheadedness with hypotension: Yes Has patient had a PCN reaction causing severe rash involving mucus membranes or skin necrosis: No Has patient had a PCN reaction that required hospitalization No Has patient had a PCN reaction occurring within the last 10 years: No If all of the above answers are "NO", then may proceed with Cephalosporin use.      Medication List    STOP taking these medications   insulin aspart 100 UNIT/ML injection Commonly known as:  novoLOG     TAKE these medications    albuterol 108 (90 Base) MCG/ACT inhaler Commonly known as:  PROVENTIL HFA;VENTOLIN HFA Inhale 2 puffs into the lungs every 6 (six) hours as needed for wheezing or shortness of breath.   budesonide-formoterol 160-4.5 MCG/ACT inhaler Commonly known as:  SYMBICORT Inhale 2 puffs into the lungs 2 (two) times daily.   gabapentin 300 MG capsule Commonly known as:  NEURONTIN Take 600 mg by mouth 3 (three) times daily.   ondansetron 8 MG tablet Commonly known as:  ZOFRAN Take 1 tablet (8 mg total) by mouth every 8 (eight) hours as needed for nausea or vomiting.   pantoprazole 40 MG tablet Commonly known as:  PROTONIX Take 40 mg by mouth 2 (two) times daily.   sertraline 100 MG tablet Commonly known as:  ZOLOFT Take 100 mg by mouth daily.   sucralfate 1 g tablet Commonly known as:  CARAFATE Take 1 tablet (1 g total) by mouth every 6 (six) hours.            Discharge Care Instructions        Start     Ordered   05/20/17 0000  ondansetron (ZOFRAN) 8 MG tablet  Every 8 hours PRN    Question:  Supervising Provider  Answer:  Adam Phenix   05/20/17 1646   05/20/17 0000  Discharge patient    Question Answer Comment  Discharge disposition 01-Home or Self Care   Discharge patient date 05/20/2017      05/20/17 1646       Katrinka Blazing [place] 05/20/2017  4:50 PM

## 2017-05-20 NOTE — Discharge Instructions (Signed)
Ovarian Cyst You have a 4 cm simple cyst on your ovary. This is not a dangerous condition. Most cysts pop on there own and do no require surgery. You need a follow-up ultrasound in 6-12 weeks to see if it has gone away. This can be scheduled by your gynecologist.   An ovarian cyst is a fluid-filled sac that forms on an ovary. The ovaries are small organs that produce eggs in women. Various types of cysts can form on the ovaries. Some may cause symptoms and require treatment. Most ovarian cysts go away on their own, are not cancerous (are benign), and do not cause problems. Common types of ovarian cysts include:  Functional (follicle) cysts. ? Occur during the menstrual cycle, and usually go away with the next menstrual cycle if you do not get pregnant. ? Usually cause no symptoms.  Endometriomas. ? Are cysts that form from the tissue that lines the uterus (endometrium). ? Are sometimes called chocolate cysts because they become filled with blood that turns brown. ? Can cause pain in the lower abdomen during intercourse and during your period.  Cystadenoma cysts. ? Develop from cells on the outside surface of the ovary. ? Can get very large and cause lower abdomen pain and pain with intercourse. ? Can cause severe pain if they twist or break open (rupture).  Dermoid cysts. ? Are sometimes found in both ovaries. ? May contain different kinds of body tissue, such as skin, teeth, hair, or cartilage. ? Usually do not cause symptoms unless they get very big.  Theca lutein cysts. ? Occur when too much of a certain hormone (human chorionic gonadotropin) is produced and overstimulates the ovaries to produce an egg. ? Are most common after having procedures used to assist with the conception of a baby (in vitro fertilization).  What are the causes? Ovarian cysts may be caused by:  Ovarian hyperstimulation syndrome. This is a condition that can develop from taking fertility medicines. It  causes multiple large ovarian cysts to form.  Polycystic ovarian syndrome (PCOS). This is a common hormonal disorder that can cause ovarian cysts, as well as problems with your period or fertility.  What increases the risk? The following factors may make you more likely to develop ovarian cysts:  Being overweight or obese.  Taking fertility medicines.  Taking certain forms of hormonal birth control.  Smoking.  What are the signs or symptoms? Many ovarian cysts do not cause symptoms. If symptoms are present, they may include:  Pelvic pain or pressure.  Pain in the lower abdomen.  Pain during sex.  Abdominal swelling.  Abnormal menstrual periods.  Increasing pain with menstrual periods.  How is this diagnosed? These cysts are commonly found during a routine pelvic exam. You may have tests to find out more about the cyst, such as:  Ultrasound.  X-ray of the pelvis.  CT scan.  MRI.  Blood tests.  How is this treated? Many ovarian cysts go away on their own without treatment. Your health care provider may want to check your cyst regularly for 2-3 months to see if it changes. If you are in menopause, it is especially important to have your cyst monitored closely because menopausal women have a higher rate of ovarian cancer. When treatment is needed, it may include:  Medicines to help relieve pain.  A procedure to drain the cyst (aspiration).  Surgery to remove the whole cyst.  Hormone treatment or birth control pills. These methods are sometimes used to help dissolve  a cyst.  Follow these instructions at home:  Take over-the-counter and prescription medicines only as told by your health care provider.  Do not drive or use heavy machinery while taking prescription pain medicine.  Get regular pelvic exams and Pap tests as often as told by your health care provider.  Return to your normal activities as told by your health care provider. Ask your health care  provider what activities are safe for you.  Do not use any products that contain nicotine or tobacco, such as cigarettes and e-cigarettes. If you need help quitting, ask your health care provider.  Keep all follow-up visits as told by your health care provider. This is important. Contact a health care provider if:  Your periods are late, irregular, or painful, or they stop.  You have pelvic pain that does not go away.  You have pressure on your bladder or trouble emptying your bladder completely.  You have pain during sex.  You have any of the following in your abdomen: ? A feeling of fullness. ? Pressure. ? Discomfort. ? Pain that does not go away. ? Swelling.  You feel generally ill.  You become constipated.  You lose your appetite.  You develop severe acne.  You start to have more body hair and facial hair.  You are gaining weight or losing weight without changing your exercise and eating habits.  You think you may be pregnant. Get help right away if:  You have abdominal pain that is severe or gets worse.  You cannot eat or drink without vomiting.  You suddenly develop a fever.  Your menstrual period is much heavier than usual. This information is not intended to replace advice given to you by your health care provider. Make sure you discuss any questions you have with your health care provider. Document Released: 09/14/2005 Document Revised: 04/03/2016 Document Reviewed: 02/16/2016 Elsevier Interactive Patient Education  [employer].

## 2017-05-20 NOTE — Discharge Instructions (Signed)
Continue your regular medications for your stomach problems. Please follow up with your specialist, call them tomorrow. You can also follow up with OB/GYN regarding your ovarian cyst, although I do not think that is what is causing your pain.

## 2017-05-21 LAB — GC/CHLAMYDIA PROBE AMP (~~LOC~~) NOT AT ARMC
CHLAMYDIA, DNA PROBE: NEGATIVE
NEISSERIA GONORRHEA: NEGATIVE

## 2017-05-21 LAB — HIV ANTIBODY (ROUTINE TESTING W REFLEX): HIV Screen 4th Generation wRfx: NONREACTIVE

## 2017-05-21 LAB — HEPATITIS C ANTIBODY: HCV Ab: <0.1 s/co ratio (ref 0.0–0.9)

## 2017-05-24 ENCOUNTER — Telehealth: Payer: Self-pay

## 2017-05-24 NOTE — Telephone Encounter (Signed)
-----   Message from [place], [place] sent at 05/23/2017  8:16 AM EDT ----- Please inform pt of neg Hep C.

## 2017-05-24 NOTE — Telephone Encounter (Signed)
Called patient to inform her of negative Hep C test results. I have left her a message to call us back regarding this.

## 2017-06-03 NOTE — Telephone Encounter (Signed)
Called patient, no answer- left message to call us back concerning non urgent test results. Will send letter

## 2017-06-07 ENCOUNTER — Ambulatory Visit (INDEPENDENT_AMBULATORY_CARE_PROVIDER_SITE_OTHER): Payer: Self-pay | Admitting: Family Medicine

## 2017-06-07 ENCOUNTER — Encounter: Payer: Self-pay | Admitting: Family Medicine

## 2017-06-07 VITALS — BP 105/60 | HR 61 | Ht 61.0 in | Wt 201.8 lb

## 2017-06-07 DIAGNOSIS — N920 Excessive and frequent menstruation with regular cycle: Secondary | ICD-10-CM | POA: Insufficient documentation

## 2017-06-07 DIAGNOSIS — N83201 Unspecified ovarian cyst, right side: Secondary | ICD-10-CM

## 2017-06-07 NOTE — Assessment & Plan Note (Signed)
Given her chronic medical problems and multiple prior surgeries, would recommend IUD insertion. [facility]return for insertion. Should help both pain and bleeding.

## 2017-06-07 NOTE — Progress Notes (Signed)
   Subjective:    Patient ID: Shelby Hatfield is a 41 y.o. female presenting with Abdominal Pain  on 06/07/2017  HPI: Here to f/u her ovarian cyst. Had pain x 3 wks. Had right ovarian cyst. Continued to have pain and then had a second period this month. Pain was worse. More pain lately which is waking her up in the middle of the night. Pain is stabbing. Feels like contractions. Has pain with defecation. Has ? Of endometriosis. Reports that her cycles are really bad and heavy and last 7-9 days and had 2 cycles in August.  Review of Systems  Constitutional: Negative for chills and fever.  Respiratory: Negative for shortness of breath.   Cardiovascular: Negative for chest pain.  Gastrointestinal: Negative for abdominal pain, nausea and vomiting.  Genitourinary: Negative for dysuria.  Skin: Negative for rash.      Objective:    BP 105/60   Pulse 61   Ht 5\' 1"  (1.549 m)   Wt 201 lb 12.8 oz (91.5 kg)   LMP 05/17/2017   BMI 38.13 kg/m  Physical Exam  Constitutional: She is oriented to person, place, and time. She appears well-developed and well-nourished. No distress.  HENT:  Head: Normocephalic and atraumatic.  Eyes: No scleral icterus.  Neck: Neck supple.  Cardiovascular: Normal rate.   Pulmonary/Chest: Effort normal.  Abdominal: Soft.  Neurological: She is alert and oriented to person, place, and time.  Skin: Skin is warm and dry.  Psychiatric: She has a normal mood and affect.        Assessment & Plan:   Problem List Items Addressed This Visit      Unprioritized   Menorrhagia with regular cycle - Primary    Given her chronic medical problems and multiple prior surgeries, would recommend IUD insertion. [facility]return for insertion. Should help both pain and bleeding.       Other Visit Diagnoses    Cyst of right ovary       c/w follicular cyst--normal course of cyst reviewed with patient. Should resolve spontaneously.        Total face-to-face time with patient: 20 minutes. Over 50% of encounter was spent on counseling and coordination of care. Return in about 4 weeks (around 07/05/2017), or pap and IUD insertion.  Shelby Hatfield 06/07/2017 10:30 AM

## 2017-06-07 NOTE — Patient Instructions (Signed)
Levonorgestrel intrauterine device (IUD) What is this medicine? LEVONORGESTREL IUD (LEE voe nor jes trel) is a contraceptive (birth control) device. The device is placed inside the uterus by a healthcare professional. It is used to prevent pregnancy. This device can also be used to treat heavy bleeding that occurs during your period. This medicine may be used for other purposes; ask your health care provider or pharmacist if you have questions. COMMON BRAND NAME(S): Kyleena, LILETTA, Mirena, Skyla What should I tell my health care provider before I take this medicine? They need to know if you have any of these conditions: -abnormal Pap smear -cancer of the breast, uterus, or cervix -diabetes -endometritis -genital or pelvic infection now or in the past -have more than one sexual partner or your partner has more than one partner -heart disease -history of an ectopic or tubal pregnancy -immune system problems -IUD in place -liver disease or tumor -problems with blood clots or take blood-thinners -seizures -use intravenous drugs -uterus of unusual shape -vaginal bleeding that has not been explained -an unusual or allergic reaction to levonorgestrel, other hormones, silicone, or polyethylene, medicines, foods, dyes, or preservatives -pregnant or trying to get pregnant -breast-feeding How should I use this medicine? This device is placed inside the uterus by a health care professional. Talk to your pediatrician regarding the use of this medicine in children. Special care may be needed. Overdosage: If you think you have taken too much of this medicine contact a poison control center or emergency room at once. NOTE: This medicine is only for you. Do not share this medicine with others. What if I miss a dose? This does not apply. Depending on the brand of device you have inserted, the device will need to be replaced every 3 to 5 years if you wish to continue using this type of birth  control. What may interact with this medicine? Do not take this medicine with any of the following medications: -amprenavir -bosentan -fosamprenavir This medicine may also interact with the following medications: -aprepitant -armodafinil -barbiturate medicines for inducing sleep or treating seizures -bexarotene -boceprevir -griseofulvin -medicines to treat seizures like carbamazepine, ethotoin, felbamate, oxcarbazepine, phenytoin, topiramate -modafinil -pioglitazone -rifabutin -rifampin -rifapentine -some medicines to treat HIV infection like atazanavir, efavirenz, indinavir, lopinavir, nelfinavir, tipranavir, ritonavir -St. John's wort -warfarin This list may not describe all possible interactions. Give your health care provider a list of all the medicines, herbs, non-prescription drugs, or dietary supplements you use. Also tell them if you smoke, drink alcohol, or use illegal drugs. Some items may interact with your medicine. What should I watch for while using this medicine? Visit your doctor or health care professional for regular check ups. See your doctor if you or your partner has sexual contact with others, becomes HIV positive, or gets a sexual transmitted disease. This product does not protect you against HIV infection (AIDS) or other sexually transmitted diseases. You can check the placement of the IUD yourself by reaching up to the top of your vagina with clean fingers to feel the threads. Do not pull on the threads. It is a good habit to check placement after each menstrual period. Call your doctor right away if you feel more of the IUD than just the threads or if you cannot feel the threads at all. The IUD may come out by itself. You may become pregnant if the device comes out. If you notice that the IUD has come out use a backup birth control method like condoms and call your   health care provider. Using tampons will not change the position of the IUD and are okay to use  during your period. This IUD can be safely scanned with magnetic resonance imaging (MRI) only under specific conditions. Before you have an MRI, tell your healthcare provider that you have an IUD in place, and which type of IUD you have in place. What side effects may I notice from receiving this medicine? Side effects that you should report to your doctor or health care professional as soon as possible: -allergic reactions like skin rash, itching or hives, swelling of the face, lips, or tongue -fever, flu-like symptoms -genital sores -high blood pressure -no menstrual period for 6 weeks during use -pain, swelling, warmth in the leg -pelvic pain or tenderness -severe or sudden headache -signs of pregnancy -stomach cramping -sudden shortness of breath -trouble with balance, talking, or walking -unusual vaginal bleeding, discharge -yellowing of the eyes or skin Side effects that usually do not require medical attention (report to your doctor or health care professional if they continue or are bothersome): -acne -breast pain -change in sex drive or performance -changes in weight -cramping, dizziness, or faintness while the device is being inserted -headache -irregular menstrual bleeding within first 3 to 6 months of use -nausea This list may not describe all possible side effects. Call your doctor for medical advice about side effects. You may report side effects to FDA at 1-800-FDA-1088. Where should I keep my medicine? This does not apply. NOTE: This sheet is a summary. It may not cover all possible information. If you have questions about this medicine, talk to your doctor, pharmacist, or health care provider.  2018 Elsevier/Gold Standard (2016-06-26 14:14:56) Ovarian Cyst An ovarian cyst is a fluid-filled sac on an ovary. The ovaries are organs that make eggs in women. Most ovarian cysts go away on their own and are not cancerous (are benign). Some cysts need treatment. Follow  these instructions at home:  Take over-the-counter and prescription medicines only as told by your doctor.  Do not drive or use heavy machinery while taking prescription pain medicine.  Get pelvic exams and Pap tests as often as told by your doctor.  Return to your normal activities as told by your doctor. Ask your doctor what activities are safe for you.  Do not use any products that contain nicotine or tobacco, such as cigarettes and e-cigarettes. If you need help quitting, ask your doctor.  Keep all follow-up visits as told by your doctor. This is important. Contact a doctor if:  Your periods are: ? Late. ? Irregular. ? Painful.  Your periods stop.  You have pelvic pain that does not go away.  You have pressure on your bladder.  You have trouble making your bladder empty when you pee (urinate).  You have pain during sex.  You have any of the following in your belly (abdomen): ? A feeling of fullness. ? Pressure. ? Discomfort. ? Pain that does not go away. ? Swelling.  You feel sick most of the time.  You have trouble pooping (have constipation).  You are not as hungry as usual (you lose your appetite).  You get very bad acne.  You start to have more hair on your body and face.  You are gaining weight or losing weight without changing your exercise and eating habits.  You think you may be pregnant. Get help right away if:  You have belly pain that is very bad or gets worse.  You cannot eat  or drink without throwing up (vomiting).  You suddenly get a fever.  Your period is a lot heavier than usual. This information is not intended to replace advice given to you by your health care provider. Make sure you discuss any questions you have with your health care provider. Document Released: 03/02/2008 Document Revised: 04/03/2016 Document Reviewed: 02/16/2016 Elsevier Interactive Patient Education  2017 [employer]

## 2017-06-07 NOTE — Progress Notes (Signed)
Elevated PHQ9 but refused to see Behavior Clinician.

## 2017-07-24 ENCOUNTER — Encounter (HOSPITAL_COMMUNITY): Payer: Self-pay | Admitting: *Deleted

## 2017-07-24 ENCOUNTER — Emergency Department (HOSPITAL_COMMUNITY): Payer: Self-pay

## 2017-07-24 ENCOUNTER — Emergency Department (HOSPITAL_COMMUNITY)
Admission: EM | Admit: 2017-07-24 | Discharge: 2017-07-24 | Disposition: A | Discharge status: Home / Self Care | Payer: Self-pay | Attending: Emergency Medicine | Admitting: Emergency Medicine

## 2017-07-24 DIAGNOSIS — F17210 Nicotine dependence, cigarettes, uncomplicated: Secondary | ICD-10-CM | POA: Insufficient documentation

## 2017-07-24 DIAGNOSIS — I509 Heart failure, unspecified: Secondary | ICD-10-CM | POA: Insufficient documentation

## 2017-07-24 DIAGNOSIS — M25561 Pain in right knee: Secondary | ICD-10-CM | POA: Insufficient documentation

## 2017-07-24 DIAGNOSIS — J449 Chronic obstructive pulmonary disease, unspecified: Secondary | ICD-10-CM | POA: Insufficient documentation

## 2017-07-24 DIAGNOSIS — E119 Type 2 diabetes mellitus without complications: Secondary | ICD-10-CM | POA: Insufficient documentation

## 2017-07-24 DIAGNOSIS — Z79899 Other long term (current) drug therapy: Secondary | ICD-10-CM | POA: Insufficient documentation

## 2017-07-24 MED ORDER — TRAMADOL HCL 50 MG PO TABS: 50.0000 mg | ORAL_TABLET | Freq: Four times a day (QID) | ORAL | 0 refills | Status: DC | PRN

## 2017-07-24 MED ORDER — TRAMADOL HCL 50 MG PO TABS
50.0000 mg | ORAL_TABLET | Freq: Once | ORAL | Status: AC
  Administered 2017-07-24: 50 mg via ORAL
  Filled 2017-07-24: qty 1

## 2017-07-24 NOTE — ED Notes (Signed)
Patient transported to X-ray 

## 2017-07-24 NOTE — ED Provider Notes (Signed)
[facility] EMERGENCY DEPARTMENT Provider Note   CSN: [id number] Arrival date & time: 07/24/17  2029     History   Chief Complaint Chief Complaint  Patient presents with  . Knee Pain    HPI Shelby Hatfield is a 41 y.o. female.  HPI Patient presents with right knee pain after tripping over her granddaughter while backing down the stairs this afternoon around 4:30 PM.  States she twisted her right knee gave out.  She has had difficulty weightbearing since that time.  She also complains of left-sided rib pain where she struck the wall.  Denies head or neck injury.  No loss of consciousness.  No focal weakness or numbness. Past Medical History:  Diagnosis Date  . Adenotonsillar hypertrophy 03/2012   snores during sleep; denies apnea; states occ. wakes up coughing  . Anemia   . Anxiety   . Bipolar 1 disorder (HCC)   . 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 Depression   . Diabetes mellitus without complication (HCC)   . Drug-seeking behavior   . GERD (gastroesophageal reflux disease)   . GI bleed   . Heart failure (HCC)   . Hypercholesterolemia   . Migraines   . Obesity   . Pneumonia   . Wears dentures    upper denture    Patient Active Problem List   Diagnosis Date Noted  . Menorrhagia with regular cycle 06/07/2017  . Cameron ulcer   . Rectal bleeding 06/30/2016  . Chronic diarrhea 06/30/2016  . Gastroesophageal reflux disease with esophagitis   . Hiatal hernia   . Morbid obesity due to excess calories (HCC)   . Protein-calorie malnutrition, severe 06/02/2016  . PUD (peptic ulcer disease) 06/01/2016  . Abdominal pain, chronic, epigastric 02/12/2016  . 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 Pulmonary nodules 02/12/2016  . Acute on chronic diastolic heart failure (HCC) 01/21/2015  . 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 Lower leg edema 01/19/2015  . Diabetes mellitus type 2 in obese (HCC)   . COPD  (chronic obstructive pulmonary disease) (HCC) 08/18/2014  . Chest tightness 08/18/2014  . Opiate addiction (HCC) 05/22/2014  . 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 Opiate dependence, continuous (HCC) 04/20/2013  . Benzodiazepine dependence (HCC) 04/20/2013    Past Surgical History:  Procedure Laterality Date  . APPENDECTOMY    . BIOPSY  02/14/2016   Procedure: BIOPSY;  Surgeon: West Bali, MD;  Location: [facility];  Service: Endoscopy;;  gastric biopsies  . cardiac thoracic surgery    . CESAREAN SECTION  08/31/2001   total of  3  . CHOLECYSTECTOMY    . DILATION AND EVACUATION  04/09/2000  . ESOPHAGEAL DILATION N/A 09/03/2016   Procedure: ESOPHAGEAL DILATION;  Surgeon: Corbin Ade, MD;  Location: [facility];  Service: Endoscopy;  Laterality: N/A;  . ESOPHAGOGASTRODUODENOSCOPY (EGD) WITH PROPOFOL N/A 02/14/2016   Dr. Darrick Penna: Large hiatal hernia, nonbleeding cratered gastric ulcer. Multiple nonbleeding erosions in the gastric fundus. Diffuse moderate inflammation, erosions, erythema of the gastric antrum. All biopsies benign with no evidence of H. pylori.  . ESOPHAGOGASTRODUODENOSCOPY (EGD) WITH PROPOFOL N/A 06/02/2016   Procedure: ESOPHAGOGASTRODUODENOSCOPY (EGD) WITH PROPOFOL;  Surgeon: Dorena Cookey, MD;  Location: [facility] ENDOSCOPY;  Service: Endoscopy;  Laterality: N/A;  . ESOPHAGOGASTRODUODENOSCOPY (EGD) WITH PROPOFOL N/A 09/03/2016   Procedure: ESOPHAGOGASTRODUODENOSCOPY (EGD) WITH PROPOFOL;  Surgeon: Corbin Ade, MD;  Location: [facility];  Service: Endoscopy;  Laterality: N/A;  . HIATAL HERNIA REPAIR  10/2015   planning for repeat surgery in 1/18  . open paraesophageal hernia repair with gastroexy  09/2015   Dr. Francee Gentilearl Wescott  . TONSILLECTOMY     adenoidectomy  . TONSILLECTOMY AND ADENOIDECTOMY  04/12/2012   Procedure: TONSILLECTOMY AND ADENOIDECTOMY;  Surgeon: Darletta MollSui W Teoh, MD;  Location: [facility];   Service: ENT;  Laterality: Bilateral;  . TUBAL LIGATION  08/31/2001    OB History    Gravida Para Term Preterm AB Living   5 3 2 1 2 3    SAB TAB Ectopic Multiple Live Births   2       3       Home Medications    Prior to Admission medications   Medication Sig Start Date End Date Taking? Authorizing Provider  albuterol (PROVENTIL HFA;VENTOLIN HFA) 108 (90 Base) MCG/ACT inhaler Inhale 2 puffs into the lungs every 6 (six) hours as needed for wheezing or shortness of breath. 08/21/16  Yes Evon SlackGaines, Thomas C, PA-C  Aspirin-Salicylamide-Caffeine (BC HEADACHE POWDER PO) Take 1 packet by mouth daily as needed.   Yes [provider]  budesonide-formoterol (SYMBICORT) 160-4.5 MCG/ACT inhaler Inhale 2 puffs into the lungs 2 (two) times daily.   Yes [provider]  gabapentin (NEURONTIN) 300 MG capsule Take 600 mg by mouth 3 (three) times daily.   Yes [provider]  GARCINIA CAMBOGIA-CHROMIUM PO Take 1 capsule by mouth daily.   Yes [provider]  loratadine (CLARITIN) 10 MG tablet Take 10 mg by mouth daily.   Yes [provider]  ondansetron (ZOFRAN) 8 MG tablet Take 1 tablet (8 mg total) by mouth every 8 (eight) hours as needed for nausea or vomiting. 05/20/17  Yes Katrinka BlazingSmith, [place], CNM  pantoprazole (PROTONIX) 40 MG tablet Take 40 mg by mouth 2 (two) times daily.   Yes [provider]  promethazine (PHENERGAN) 25 MG tablet Take 25 mg by mouth every 8 (eight) hours as needed for nausea or vomiting.   Yes [provider]  RASPBERRY KETONES PO Take 1 tablet by mouth daily.   Yes [provider]  sertraline (ZOLOFT) 100 MG tablet Take 100 mg by mouth daily.   Yes [provider]  sucralfate (CARAFATE) 1 g tablet Take 1 tablet (1 g total) by mouth every 6 (six) hours. 09/04/16  Yes Penny PiaVega, [place], MD  traMADol (ULTRAM) 50 MG tablet Take 1 tablet (50 mg total) by mouth every 6 (six) hours as needed. 07/24/17   Loren RacerYelverton,  Onesty Clair, MD    Family History Family History  Problem Relation Age of Onset  . Cancer Other   . Hypertension Mother   . Heart disease Mother   . Cancer Mother        ovarian  . Cancer Maternal Aunt        not sure primary, in kidney and colon  . Cancer Maternal Uncle        lung cancer   . Stroke Maternal Grandfather     Social History Social History  Substance Use Topics  . Smoking status: Current Some Day Smoker    Packs/day: 0.33    Years: 18.00    Types: Cigarettes  . Smokeless tobacco: Never Used  . Alcohol use No     Allergies   Lidocaine viscous and Penicillins   Review of Systems Review of Systems  Constitutional: Negative for chills and fever.  Respiratory: Negative for cough and shortness of breath.   Cardiovascular: Negative for chest pain.  Gastrointestinal: Negative for abdominal pain, diarrhea, nausea and vomiting.  Musculoskeletal: Positive for arthralgias. Negative for back pain, myalgias, neck pain and neck stiffness.  Neurological: Negative for dizziness, syncope, weakness, numbness and headaches.  All other systems reviewed and are negative.    Physical Exam Updated Vital Signs BP 117/79 (BP Location: Right Arm)   Pulse 76   Temp 98.7 F (37.1 C) (Oral)   Resp 20   Ht 5\' 2"  (1.575 m)   Wt 85.3 kg (188 lb)   LMP 07/03/2017   SpO2 99%   BMI 34.39 kg/m   Physical Exam  Constitutional: She is oriented to person, place, and time. She appears well-developed and well-nourished. No distress.  HENT:  Head: Normocephalic and atraumatic.  Mouth/Throat: Oropharynx is clear and moist.  Eyes: Pupils are equal, round, and reactive to light. EOM are normal.  Neck: Normal range of motion. Neck supple.  No posterior midline cervical tenderness to palpation.  Cardiovascular: Normal rate and regular rhythm.  Exam reveals no gallop and no friction rub.   No murmur heard. Pulmonary/Chest: Effort normal and breath sounds normal. No respiratory distress.  She has no wheezes. She has no rales. She exhibits tenderness.  Mild left lateral chest wall tenderness.  There is no crepitance or deformity.  Abdominal: Soft. Bowel sounds are normal. There is no tenderness. There is no rebound and no guarding.  Musculoskeletal: Normal range of motion. She exhibits no edema or tenderness.  Patient with diffuse right knee tenderness especially over the medial surface.  Decreased range of motion due to pain.  No definite ligamentous instability.  Distal pulses are 2+.  Neurological: She is alert and oriented to person, place, and time.  5/5 motor in all extremities.  Sensation fully intact.  Skin: Skin is warm and dry. No rash noted. No erythema.  Psychiatric: She has a normal mood and affect. Her behavior is normal.  Nursing note and vitals reviewed.    ED Treatments / Results  Labs (all labs ordered are listed, but only abnormal results are displayed) Labs Reviewed - No data to display  EKG  EKG Interpretation None       Radiology Dg Ribs Unilateral W/chest Left  Result Date: 07/24/2017 CLINICAL DATA:  Acute onset of left axillary rib pain after fall. Initial encounter. EXAM: LEFT RIBS AND CHEST - 3+ VIEW COMPARISON:  Chest radiograph performed 12/04/2016 FINDINGS: No displaced rib fractures are seen. The lungs are well-aerated and clear. There is no evidence of focal opacification, pleural effusion or pneumothorax. The cardiomediastinal silhouette is within normal limits. No acute osseous abnormalities are seen. Clips are noted within the right upper quadrant, reflecting prior cholecystectomy. IMPRESSION: No displaced rib fracture seen. No acute cardiopulmonary process identified. Electronically Signed   By: Roanna Raider M.D.   On: 07/24/2017 21:51   Dg Tibia/fibula Right  Result Date: 07/24/2017 CLINICAL DATA:  Acute onset of medial right knee pain and proximal right lower leg pain after tripping over granddaughter. Initial encounter. EXAM:  RIGHT TIBIA AND FIBULA - 2 VIEW COMPARISON:  Right tibia/fibula radiographs performed 01/21/2015 FINDINGS: There is no evidence of fracture or dislocation. The tibia and fibula appear intact. The ankle mortise is incompletely assessed, but appears grossly unremarkable. Mild medial soft tissue swelling is noted about the knee. No knee joint effusion is seen. A fabella is noted. IMPRESSION: No evidence of fracture or dislocation. Electronically Signed  By: Roanna Raider M.D.   On: 07/24/2017 21:50   Dg Knee Complete 4 Views Right  Result Date: 07/24/2017 CLINICAL DATA:  Acute onset of medial right knee pain after tripping over granddaughter. Twisting injury to the right knee. Initial encounter. EXAM: RIGHT KNEE - COMPLETE 4+ VIEW COMPARISON:  Right tibia/fibula radiographs performed 01/21/2015 FINDINGS: There is no evidence of fracture or dislocation. The joint spaces are preserved. No significant degenerative change is seen; the patellofemoral joint is grossly unremarkable in appearance. A fabella is noted. No significant joint effusion is seen. The visualized soft tissues are normal in appearance. IMPRESSION: No evidence of fracture or dislocation. Electronically Signed   By: Roanna Raider M.D.   On: 07/24/2017 21:48    Procedures Procedures (including critical care time)  Medications Ordered in ED Medications  traMADol (ULTRAM) tablet 50 mg (not administered)     Initial Impression / Assessment and Plan / ED Course  I have reviewed the triage vital signs and the nursing notes.  Pertinent labs & imaging results that were available during my care of the patient were reviewed by me and considered in my medical decision making (see chart for details).     No obvious injury on x-ray.  Given concern for ligamentous versus missed meniscal injury will place in knee immobilizer and give crutches.  Will need to follow-up with orthopedic as outpatient.  Final Clinical Impressions(s) / ED Diagnoses    Final diagnoses:  Acute pain of right knee    New Prescriptions New Prescriptions   TRAMADOL (ULTRAM) 50 MG TABLET    Take 1 tablet (50 mg total) by mouth every 6 (six) hours as needed.     Loren Racer, MD 07/24/17 2204

## 2017-07-24 NOTE — ED Notes (Signed)
Pt alert & oriented x4, stable gait. Patient given discharge instructions, paperwork & prescription(s). Patient  instructed to stop at the registration desk to finish any additional paperwork. Patient verbalized understanding. Pt left department w/ no further questions. 

## 2017-07-24 NOTE — ED Triage Notes (Signed)
Pt reports tripping over her granddaughter on the stairs at her home. Pt reports she twisted her right knee, which she is now having pain in the inside of her knee that is sending sharp, aching pains in to her right leg. Pt also reports left rib pain when she takes a deep breath.

## 2017-10-24 IMAGING — DX DG RIBS W/ CHEST 3+V*L*
5 series · 5 of 5 positions shown · non-contrast
Comparison: 12/19/2015

CLINICAL DATA: Fell 1 month ago on left side. Fell again last week.
Pain in the left ribs.

EXAM:
LEFT RIBS AND CHEST - 3+ VIEW

[chest pa]
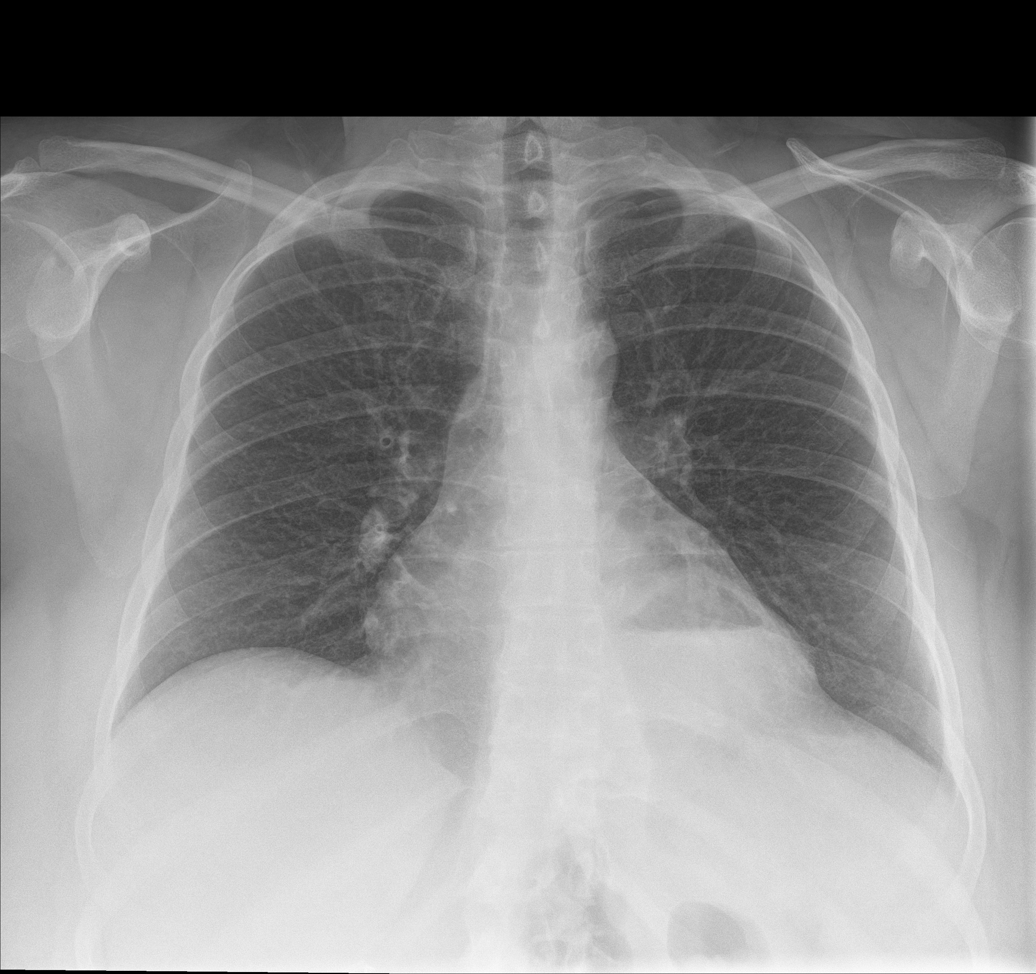

[rib obl (1 of 3)]
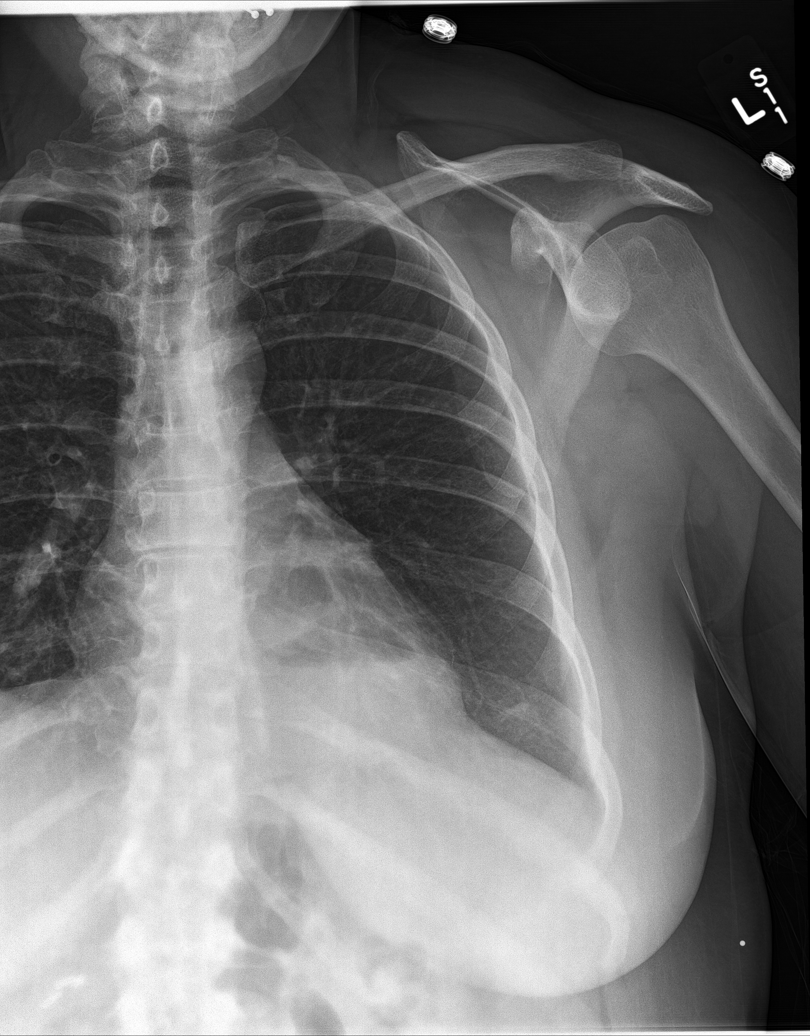

[rib obl (2 of 3)]
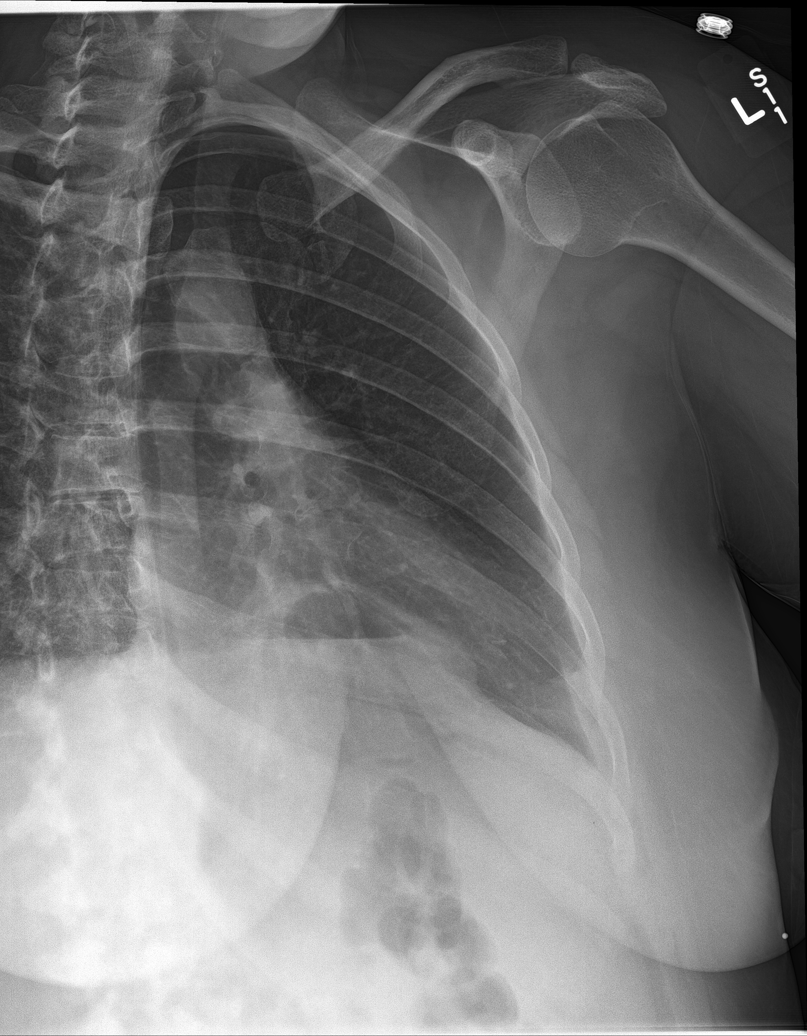

[rib pa]
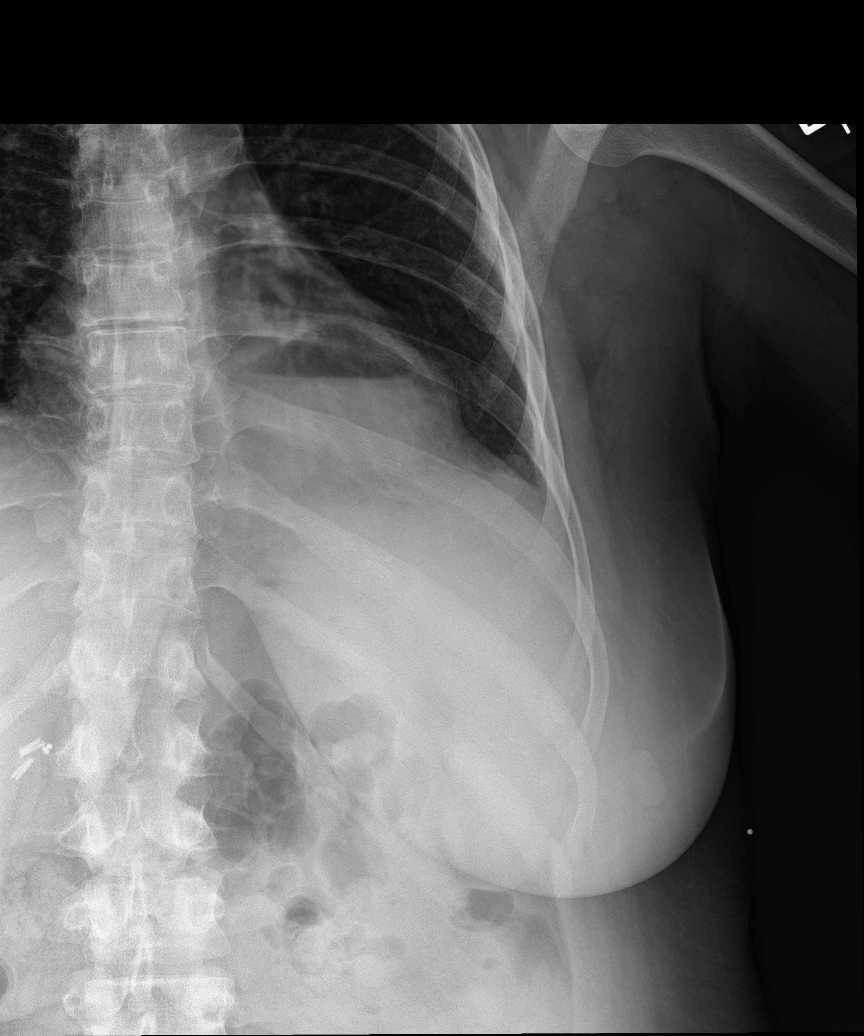

[rib obl (3 of 3)]
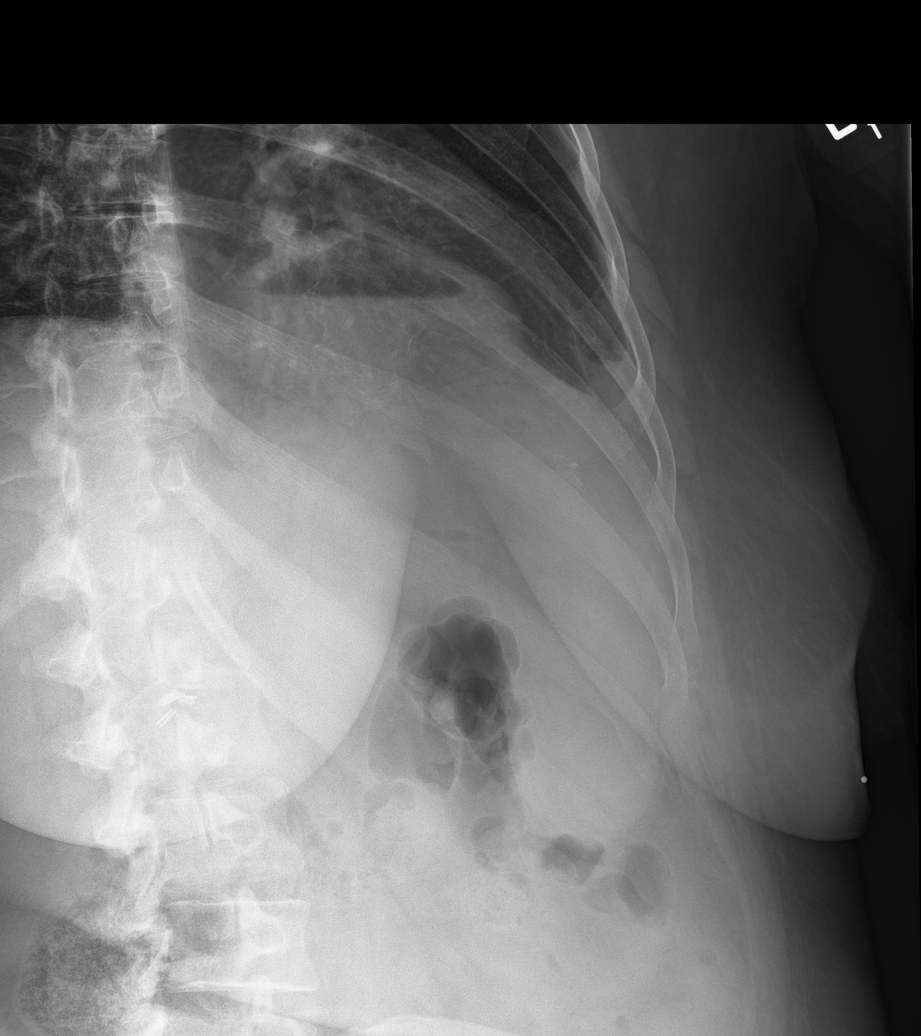

[5 of 5 positions shown; findings below may reference images not displayed]

FINDINGS: Chest radiograph is negative for a pneumothorax. Right lung is
clear. Heart size is normal. Air-fluid level in the retrocardiac
region is compatible with a hiatal hernia. Again noted is mild
blunting at the left costophrenic angle. Difficult to exclude a
small amount of left pleural fluid. No evidence for a displaced left
rib fracture. Small elongated nodular structure at the left lung
base has a different configuration on different views and may
represent overlying shadows.
IMPRESSION: No evidence for a displaced left rib fracture.

Mild blunting at the left costophrenic angle. Previously, patient
had a small left pleural effusion. Findings could be related to
residual pleural fluid versus pleural thickening.

Hiatal hernia.

## 2017-11-03 ENCOUNTER — Encounter (HOSPITAL_COMMUNITY): Payer: Self-pay | Admitting: Emergency Medicine

## 2017-11-03 ENCOUNTER — Emergency Department (HOSPITAL_COMMUNITY): Payer: Self-pay

## 2017-11-03 ENCOUNTER — Emergency Department (HOSPITAL_COMMUNITY)
Admission: EM | Admit: 2017-11-03 | Discharge: 2017-11-03 | Disposition: A | Discharge status: Home / Self Care | Payer: Self-pay | Attending: Emergency Medicine | Admitting: Emergency Medicine

## 2017-11-03 ENCOUNTER — Other Ambulatory Visit: Payer: Self-pay

## 2017-11-03 DIAGNOSIS — L03011 Cellulitis of right finger: Secondary | ICD-10-CM | POA: Insufficient documentation

## 2017-11-03 DIAGNOSIS — F17210 Nicotine dependence, cigarettes, uncomplicated: Secondary | ICD-10-CM | POA: Insufficient documentation

## 2017-11-03 DIAGNOSIS — Z79899 Other long term (current) drug therapy: Secondary | ICD-10-CM | POA: Insufficient documentation

## 2017-11-03 DIAGNOSIS — E119 Type 2 diabetes mellitus without complications: Secondary | ICD-10-CM | POA: Insufficient documentation

## 2017-11-03 DIAGNOSIS — R112 Nausea with vomiting, unspecified: Secondary | ICD-10-CM | POA: Insufficient documentation

## 2017-11-03 DIAGNOSIS — R2231 Localized swelling, mass and lump, right upper limb: Secondary | ICD-10-CM | POA: Insufficient documentation

## 2017-11-03 DIAGNOSIS — J449 Chronic obstructive pulmonary disease, unspecified: Secondary | ICD-10-CM | POA: Insufficient documentation

## 2017-11-03 LAB — CBG MONITORING, ED: GLUCOSE-CAPILLARY: 112 mg/dL — AB (ref 65–99)

## 2017-11-03 MED ORDER — SULFAMETHOXAZOLE-TRIMETHOPRIM 800-160 MG PO TABS: 1.0000 | ORAL_TABLET | Freq: Two times a day (BID) | ORAL | 0 refills | Status: AC

## 2017-11-03 MED ORDER — LIDOCAINE HCL (PF) 1 % IJ SOLN
5.0000 mL | Freq: Once | INTRAMUSCULAR | Status: AC
  Administered 2017-11-03: 5 mL
  Filled 2017-11-03: qty 6

## 2017-11-03 MED ORDER — HYDROCODONE-ACETAMINOPHEN 5-325 MG PO TABS
2.0000 | ORAL_TABLET | Freq: Once | ORAL | Status: AC
  Administered 2017-11-03: 2 via ORAL
  Filled 2017-11-03: qty 2

## 2017-11-03 MED ORDER — HYDROCODONE-ACETAMINOPHEN 5-325 MG PO TABS: 1.0000 | ORAL_TABLET | Freq: Four times a day (QID) | ORAL | 0 refills | Status: DC | PRN

## 2017-11-03 NOTE — ED Provider Notes (Signed)
[facility] EMERGENCY DEPARTMENT Provider Note   CSN: [id number] Arrival date & time: 11/03/17  0304     History   Chief Complaint Chief Complaint  Patient presents with  . Abscess    HPI Shelby Hatfield is a 42 y.o. female.  The history is provided by the patient.  Abscess  Location:  Finger Finger abscess location:  R index finger Abscess quality: induration, painful, redness and warmth   Duration:  7 days Progression:  Worsening Pain details:    Quality:  Dull and pressure   Timing:  Constant   Progression:  Worsening Chronicity:  Recurrent Relieved by:  Nothing Worsened by:  Draining/squeezing Ineffective treatments:  Oral antibiotics Associated symptoms: fever and vomiting   She reports onset of infection of the right finger over 7 days ago.  She was seen in outside urgent care, they did attempt an incision and drainage without any pus extracted.  She has been on Bactrim daily since that time.  Her pain and swelling in her index finger is getting worse.  She reports fever and vomiting.  No other acute complaints  Past Medical History:  Diagnosis Date  . Adenotonsillar hypertrophy 03/2012   snores during sleep; denies apnea; states occ. wakes up coughing  . Anemia   . Anxiety   . Bipolar 1 disorder (HCC)   . 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 Depression   . Diabetes mellitus without complication (HCC)   . Drug-seeking behavior   . GERD (gastroesophageal reflux disease)   . GI bleed   . Heart failure (HCC)   . Hypercholesterolemia   . Migraines   . Obesity   . Pneumonia   . Wears dentures    upper denture    Patient Active Problem List   Diagnosis Date Noted  . Menorrhagia with regular cycle 06/07/2017  . Cameron ulcer   . Rectal bleeding 06/30/2016  . Chronic diarrhea 06/30/2016  . Gastroesophageal reflux disease with esophagitis   . Hiatal hernia   . Morbid obesity due to excess  calories (HCC)   . Protein-calorie malnutrition, severe 06/02/2016  . PUD (peptic ulcer disease) 06/01/2016  . Abdominal pain, chronic, epigastric 02/12/2016  . 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 Pulmonary nodules 02/12/2016  . Acute on chronic diastolic heart failure (HCC) 01/21/2015  . 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 Lower leg edema 01/19/2015  . Diabetes mellitus type 2 in obese (HCC)   . COPD (chronic obstructive pulmonary disease) (HCC) 08/18/2014  . Chest tightness 08/18/2014  . Opiate addiction (HCC) 05/22/2014  . 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 Opiate dependence, continuous (HCC) 04/20/2013  . Benzodiazepine dependence (HCC) 04/20/2013    Past Surgical History:  Procedure Laterality Date  . APPENDECTOMY    . BIOPSY  02/14/2016   Procedure: BIOPSY;  Surgeon: West Bali, MD;  Location: [facility];  Service: Endoscopy;;  gastric biopsies  . cardiac thoracic surgery    . CESAREAN SECTION  08/31/2001   total of  3  . CHOLECYSTECTOMY    . DILATION AND EVACUATION  04/09/2000  . ESOPHAGEAL DILATION N/A 09/03/2016   Procedure: ESOPHAGEAL DILATION;  Surgeon: Corbin Ade, MD;  Location: [facility];  Service: Endoscopy;  Laterality: N/A;  . ESOPHAGOGASTRODUODENOSCOPY (EGD) WITH PROPOFOL N/A 02/14/2016   Dr. Darrick Penna: Large hiatal hernia, nonbleeding cratered gastric ulcer. Multiple nonbleeding erosions  in the gastric fundus. Diffuse moderate inflammation, erosions, erythema of the gastric antrum. All biopsies benign with no evidence of H. pylori.  . ESOPHAGOGASTRODUODENOSCOPY (EGD) WITH PROPOFOL N/A 06/02/2016   Procedure: ESOPHAGOGASTRODUODENOSCOPY (EGD) WITH PROPOFOL;  Surgeon: Dorena Cookey, MD;  Location: [facility] ENDOSCOPY;  Service: Endoscopy;  Laterality: N/A;  . ESOPHAGOGASTRODUODENOSCOPY (EGD) WITH PROPOFOL N/A 09/03/2016   Procedure: ESOPHAGOGASTRODUODENOSCOPY (EGD) WITH PROPOFOL;  Surgeon: Corbin Ade, MD;   Location: [facility];  Service: Endoscopy;  Laterality: N/A;  . HIATAL HERNIA REPAIR  10/2015   planning for repeat surgery in 1/18  . open paraesophageal hernia repair with gastroexy  09/2015   Dr. Francee Gentile  . TONSILLECTOMY     adenoidectomy  . TONSILLECTOMY AND ADENOIDECTOMY  04/12/2012   Procedure: TONSILLECTOMY AND ADENOIDECTOMY;  Surgeon: Darletta Moll, MD;  Location: [facility];  Service: ENT;  Laterality: Bilateral;  . TUBAL LIGATION  08/31/2001    OB History    Gravida Para Term Preterm AB Living   5 3 2 1 2 3    SAB TAB Ectopic Multiple Live Births   2       3       Home Medications    Prior to Admission medications   Medication Sig Start Date End Date Taking? Authorizing Provider  albuterol (PROVENTIL HFA;VENTOLIN HFA) 108 (90 Base) MCG/ACT inhaler Inhale 2 puffs into the lungs every 6 (six) hours as needed for wheezing or shortness of breath. 08/21/16  Yes Evon Slack, PA-C  Aspirin-Salicylamide-Caffeine (BC HEADACHE POWDER PO) Take 1 packet by mouth daily as needed.   Yes [provider]  budesonide-formoterol (SYMBICORT) 160-4.5 MCG/ACT inhaler Inhale 2 puffs into the lungs 2 (two) times daily.   Yes [provider]  gabapentin (NEURONTIN) 300 MG capsule Take 600 mg by mouth 3 (three) times daily.   Yes [provider]  GARCINIA CAMBOGIA-CHROMIUM PO Take 1 capsule by mouth daily.   Yes [provider]  loratadine (CLARITIN) 10 MG tablet Take 10 mg by mouth daily.   Yes [provider]  ondansetron (ZOFRAN) 8 MG tablet Take 1 tablet (8 mg total) by mouth every 8 (eight) hours as needed for nausea or vomiting. 05/20/17  Yes Katrinka Blazing, [place], CNM  pantoprazole (PROTONIX) 40 MG tablet Take 40 mg by mouth 2 (two) times daily.   Yes [provider]  sucralfate (CARAFATE) 1 g tablet Take 1 tablet (1 g total) by mouth every 6 (six) hours. 09/04/16  Yes Penny Pia, MD  HYDROcodone-acetaminophen  (NORCO/VICODIN) 5-325 MG tablet Take 1 tablet by mouth every 6 (six) hours as needed for moderate pain. 11/03/17   Zadie Rhine, MD  RASPBERRY KETONES PO Take 1 tablet by mouth daily.    [provider]  sertraline (ZOLOFT) 100 MG tablet Take 100 mg by mouth daily.    [provider]  sulfamethoxazole-trimethoprim (BACTRIM DS,SEPTRA DS) 800-160 MG tablet Take 1 tablet by mouth 2 (two) times daily for 5 days. 11/03/17 11/08/17  Zadie Rhine, MD    Family History Family History  Problem Relation Age of Onset  . Cancer Other   . Hypertension Mother   . Heart disease Mother   . Cancer Mother        ovarian  . Cancer Maternal Aunt        not sure primary, in kidney and colon  . Cancer Maternal Uncle        lung cancer   . Stroke Maternal Grandfather  Social History Social History   Tobacco Use  . Smoking status: Current Some Day Smoker    Packs/day: 0.33    Years: 18.00    Pack years: 5.94    Types: Cigarettes  . Smokeless tobacco: Never Used  Substance Use Topics  . Alcohol use: No    Alcohol/week: 0.0 oz  . Drug use: No    Comment: history of cocaine in 2002; "hit some weed over Thanksgiving"     Allergies   Lidocaine viscous and Penicillins   Review of Systems Review of Systems  Constitutional: Positive for fever.  Gastrointestinal: Positive for vomiting.  Skin: Positive for wound.  All other systems reviewed and are negative.    Physical Exam Updated Vital Signs BP 127/83 (BP Location: Left Arm)   Pulse 90   Temp 98.4 F (36.9 C) (Oral)   Resp 18   Ht 1.549 m (5\' 1" )   Wt 84.4 kg (186 lb)   LMP 10/14/2017   SpO2 97%   BMI 35.14 kg/m   Physical Exam  CONSTITUTIONAL: Well developed/well nourished, anxious HEAD: Normocephalic/atraumatic EYES: EOMI ENMT: Mucous membranes moist NECK: supple no meningeal signs CV: S1/S2 noted, no murmurs/rubs/gallops noted LUNGS: Lungs are clear to auscultation bilaterally, no apparent  distress ABDOMEN: soft NEURO: Pt is awake/alert/appropriate, moves all extremitiesx4.  No facial droop.   EXTREMITIES: pulses normal/equal, full ROM, tenderness to right index finger, no crepitus.  Decreased range of motion of right index finger. SKIN: warm, color normal PSYCH: Anxious    Patient gave verbal permission to utilize photo for medical documentation only The image was not stored on any personal device ED Treatments / Results  Labs (all labs ordered are listed, but only abnormal results are displayed) Labs Reviewed  CBG MONITORING, ED - Abnormal; Notable for the following components:      Result Value   Glucose-Capillary 112 (*)    All other components within normal limits    EKG  EKG Interpretation None       Radiology Dg Finger Index Right  Result Date: 11/03/2017 CLINICAL DATA:  Acute onset of infection at the right index finger, with soft tissue swelling and pain. EXAM: RIGHT INDEX FINGER 2+V COMPARISON:  None. FINDINGS: Soft tissue swelling is noted about the right index finger. No radiopaque foreign bodies are seen. There is no evidence of osseous erosion. Visualized joint spaces are grossly preserved. IMPRESSION: No evidence of osseous erosion.  No radiopaque foreign bodies seen. Electronically Signed   By: Roanna RaiderJeffery  Chang M.D.   On: 11/03/2017 05:30    Procedures .Marland Kitchen.Incision and Drainage Date/Time: 11/03/2017 6:00 AM Performed by: Zadie RhineWickline, Joyce Leckey, MD Authorized by: Zadie RhineWickline, Lorell Thibodaux, MD   Consent:    Consent obtained:  Verbal   Consent given by:  Patient   Risks discussed:  Incomplete drainage and pain   Alternatives discussed:  No treatment Location:    Type:  Abscess (felon)   Location:  Upper extremity   Upper extremity location:  Finger   Finger location:  R index finger Pre-procedure details:    Skin preparation:  Betadine Anesthesia (see MAR for exact dosages):    Anesthesia method:  Nerve block   Block injection procedure:  Anatomic landmarks  identified   Block outcome:  Anesthesia achieved Procedure type:    Complexity:  Complex Procedure details:    Needle aspiration: no     Incision types:  Elliptical   Incision depth:  Subcutaneous   Scalpel blade:  11   Wound management:  Probed and deloculated, irrigated with saline and extensive cleaning   Drainage:  Purulent   Drainage amount:  Moderate   Wound treatment:  Wound left open   Packing materials:  None Post-procedure details:    Patient tolerance of procedure:  Tolerated well, no immediate complications .Nerve Block Date/Time: 11/03/2017 6:46 AM Performed by: Zadie Rhine, MD Authorized by: Zadie Rhine, MD   Consent:    Consent obtained:  Verbal   Consent given by:  Patient   Risks discussed:  Pain   Alternatives discussed:  No treatment Indications:    Indications:  Pain relief Location:    Body area:  Upper extremity   Upper extremity nerve blocked: digital block.   Laterality:  Right Pre-procedure details:    Preparation: Patient was prepped and draped in usual sterile fashion   Skin anesthesia (see MAR for exact dosages):    Skin anesthesia method:  None Procedure details (see MAR for exact dosages):    Block needle gauge:  25 G   Anesthetic injected:  Lidocaine 1% w/o epi Post-procedure details:    Dressing:  Sterile dressing   Outcome:  Anesthesia achieved   Patient tolerance of procedure:  Tolerated well, no immediate complications    Medications Ordered in ED Medications  HYDROcodone-acetaminophen (NORCO/VICODIN) 5-325 MG per tablet 2 tablet (2 tablets Oral Given 11/03/17 0438)  lidocaine (PF) (XYLOCAINE) 1 % injection 5 mL (5 mLs Other Given 11/03/17 0438)     Initial Impression / Assessment and Plan / ED Course  I have reviewed the triage vital signs and the nursing notes.  Pertinent labs & imaging results that were available during my care of the patient were reviewed by me and considered in my medical decision making (see chart  for details). Narcotic database reviewed and considered in decision making    Patient with obvious felon of right index finger.  She tolerated incision and drainage very well with copious amount of pus extracted.  She had improved range of motion of her finger.  her x-ray was negative.  Plan to prolong her Bactrim for another 5 days, and will give short course of Vicodin.  Due to location of abscess, I advised that she needs to have this rechecked within 48 hours either in the emergency department, her PCP, or orthopedics.   Final Clinical Impressions(s) / ED Diagnoses   Final diagnoses:  Felon of finger of right hand    ED Discharge Orders        Ordered    HYDROcodone-acetaminophen (NORCO/VICODIN) 5-325 MG tablet  Every 6 hours PRN     11/03/17 0551    sulfamethoxazole-trimethoprim (BACTRIM DS,SEPTRA DS) 800-160 MG tablet  2 times daily     11/03/17 0551       Zadie Rhine, MD 11/03/17 [phone]

## 2017-11-03 NOTE — ED Notes (Signed)
Applied bulky dressing to right index finger, patient tolerated well.

## 2017-11-03 NOTE — ED Triage Notes (Signed)
Pt has infection to the right index finger x one week.

## 2017-11-19 ENCOUNTER — Encounter (HOSPITAL_COMMUNITY): Payer: Self-pay | Admitting: Emergency Medicine

## 2017-11-19 ENCOUNTER — Emergency Department (HOSPITAL_COMMUNITY)
Admission: EM | Admit: 2017-11-19 | Discharge: 2017-11-19 | Disposition: A | Discharge status: Home / Self Care | Payer: Self-pay | Attending: Emergency Medicine | Admitting: Emergency Medicine

## 2017-11-19 ENCOUNTER — Emergency Department (HOSPITAL_COMMUNITY): Payer: Self-pay

## 2017-11-19 ENCOUNTER — Other Ambulatory Visit: Payer: Self-pay

## 2017-11-19 DIAGNOSIS — J449 Chronic obstructive pulmonary disease, unspecified: Secondary | ICD-10-CM | POA: Insufficient documentation

## 2017-11-19 DIAGNOSIS — W109XXA Fall (on) (from) unspecified stairs and steps, initial encounter: Secondary | ICD-10-CM | POA: Insufficient documentation

## 2017-11-19 DIAGNOSIS — Z7982 Long term (current) use of aspirin: Secondary | ICD-10-CM | POA: Insufficient documentation

## 2017-11-19 DIAGNOSIS — Z794 Long term (current) use of insulin: Secondary | ICD-10-CM | POA: Insufficient documentation

## 2017-11-19 DIAGNOSIS — W19XXXA Unspecified fall, initial encounter: Secondary | ICD-10-CM

## 2017-11-19 DIAGNOSIS — Y998 Other external cause status: Secondary | ICD-10-CM | POA: Insufficient documentation

## 2017-11-19 DIAGNOSIS — F17210 Nicotine dependence, cigarettes, uncomplicated: Secondary | ICD-10-CM | POA: Insufficient documentation

## 2017-11-19 DIAGNOSIS — E119 Type 2 diabetes mellitus without complications: Secondary | ICD-10-CM | POA: Insufficient documentation

## 2017-11-19 DIAGNOSIS — M545 Low back pain, unspecified: Secondary | ICD-10-CM

## 2017-11-19 DIAGNOSIS — Z79899 Other long term (current) drug therapy: Secondary | ICD-10-CM | POA: Insufficient documentation

## 2017-11-19 DIAGNOSIS — Y929 Unspecified place or not applicable: Secondary | ICD-10-CM | POA: Insufficient documentation

## 2017-11-19 DIAGNOSIS — S300XXA Contusion of lower back and pelvis, initial encounter: Secondary | ICD-10-CM | POA: Insufficient documentation

## 2017-11-19 DIAGNOSIS — Y9301 Activity, walking, marching and hiking: Secondary | ICD-10-CM | POA: Insufficient documentation

## 2017-11-19 LAB — I-STAT BETA HCG BLOOD, ED (MC, WL, AP ONLY): I-stat hCG, quantitative: <5 m[IU]/mL (ref <5)

## 2017-11-19 MED ORDER — HYDROCODONE-ACETAMINOPHEN 5-325 MG PO TABS
2.0000 | ORAL_TABLET | Freq: Once | ORAL | Status: AC
  Administered 2017-11-19: 2 via ORAL
  Filled 2017-11-19: qty 2

## 2017-11-19 MED ORDER — KETOROLAC TROMETHAMINE 60 MG/2ML IM SOLN
60.0000 mg | Freq: Once | INTRAMUSCULAR | Status: AC
  Administered 2017-11-19: 60 mg via INTRAMUSCULAR
  Filled 2017-11-19: qty 2

## 2017-11-19 MED ORDER — NAPROXEN 500 MG PO TABS: 500.0000 mg | ORAL_TABLET | Freq: Two times a day (BID) | ORAL | 0 refills | Status: DC

## 2017-11-19 MED ORDER — ONDANSETRON 4 MG PO TBDP
4.0000 mg | ORAL_TABLET | Freq: Once | ORAL | Status: AC
  Administered 2017-11-19: 4 mg via ORAL
  Filled 2017-11-19: qty 1

## 2017-11-19 MED ORDER — METHOCARBAMOL 500 MG PO TABS: 500.0000 mg | ORAL_TABLET | Freq: Two times a day (BID) | ORAL | 0 refills | Status: DC

## 2017-11-19 NOTE — ED Notes (Signed)
Patient transported to X-ray 

## 2017-11-19 NOTE — Discharge Instructions (Signed)

## 2017-11-19 NOTE — ED Provider Notes (Signed)
[facility]   CSN: [id number] Arrival date & time: 11/19/17  0106     History   Chief Complaint Chief Complaint  Patient presents with  . Back Pain    HPI Shelby Hatfield is a 42 y.o. female with a hx of polar disorder, anxiety, COPD, diabetes presents to the Emergency Department complaining of acute, persistent low back pain onset around 5 PM tonight.  Patient reports she was walking down a set of stairs when the stair broke and she landed on her back.  She reports she slid down 4 stairs.  She denies hitting her head or loss of consciousness.  She reports that she was immediately ambulatory afterwards without difficulty.  No numbness, tingling or weakness of her legs.  No loss of bowel or bladder control.  No previous back surgeries.  No history of chronic back pain.   The history is provided by the patient and medical records. No language interpreter was used.    Past Medical History:  Diagnosis Date  . Adenotonsillar hypertrophy 03/2012   snores during sleep; denies apnea; states occ. wakes up coughing  . Anemia   . Anxiety   . Bipolar 1 disorder (HCC)   . 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 Depression   . Diabetes mellitus without complication (HCC)   . Drug-seeking behavior   . GERD (gastroesophageal reflux disease)   . GI bleed   . Heart failure (HCC)   . Hypercholesterolemia   . Migraines   . Obesity   . Pneumonia   . Wears dentures    upper denture    Patient Active Problem List   Diagnosis Date Noted  . Menorrhagia with regular cycle 06/07/2017  . Cameron ulcer   . Rectal bleeding 06/30/2016  . Chronic diarrhea 06/30/2016  . Gastroesophageal reflux disease with esophagitis   . Hiatal hernia   . Morbid obesity due to excess calories (HCC)   . Protein-calorie malnutrition, severe 06/02/2016  . PUD (peptic ulcer disease) 06/01/2016  .  Abdominal pain, chronic, epigastric 02/12/2016  . 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 Pulmonary nodules 02/12/2016  . Acute on chronic diastolic heart failure (HCC) 01/21/2015  . 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 Lower leg edema 01/19/2015  . Diabetes mellitus type 2 in obese (HCC)   . COPD (chronic obstructive pulmonary disease) (HCC) 08/18/2014  . Chest tightness 08/18/2014  . Opiate addiction (HCC) 05/22/2014  . 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 Opiate dependence, continuous (HCC) 04/20/2013  . Benzodiazepine dependence (HCC) 04/20/2013    Past Surgical History:  Procedure Laterality Date  . APPENDECTOMY    . BIOPSY  02/14/2016   Procedure: BIOPSY;  Surgeon: West BaliSandi L Fields, MD;  Location: [facility];  Service: Endoscopy;;  gastric biopsies  . cardiac thoracic surgery    . CESAREAN SECTION  08/31/2001   total of  3  . CHOLECYSTECTOMY    . DILATION AND EVACUATION  04/09/2000  . ESOPHAGEAL DILATION N/A 09/03/2016   Procedure: ESOPHAGEAL DILATION;  Surgeon: Corbin Adeobert M Rourk, MD;  Location: [facility];  Service: Endoscopy;  Laterality: N/A;  . ESOPHAGOGASTRODUODENOSCOPY (EGD) WITH PROPOFOL N/A 02/14/2016   Dr. Darrick Pennafields: Large hiatal hernia, nonbleeding cratered gastric ulcer. Multiple nonbleeding erosions in the gastric fundus. Diffuse moderate inflammation, erosions, erythema of the gastric antrum. All biopsies benign with no  evidence of H. pylori.  . ESOPHAGOGASTRODUODENOSCOPY (EGD) WITH PROPOFOL N/A 06/02/2016   Procedure: ESOPHAGOGASTRODUODENOSCOPY (EGD) WITH PROPOFOL;  Surgeon: Dorena Cookey, MD;  Location[facility] ENDOSCOPY;  Service: Endoscopy;  Laterality: N/A;  . ESOPHAGOGASTRODUODENOSCOPY (EGD) WITH PROPOFOL N/A 09/03/2016   Procedure: ESOPHAGOGASTRODUODENOSCOPY (EGD) WITH PROPOFOL;  Surgeon: Corbin Ade, MD;  Location: [facility];  Service: Endoscopy;  Laterality: N/A;  . HIATAL HERNIA REPAIR  10/2015   planning for  repeat surgery in 1/18  . open paraesophageal hernia repair with gastroexy  09/2015   Dr. Francee Gentile  . TONSILLECTOMY     adenoidectomy  . TONSILLECTOMY AND ADENOIDECTOMY  04/12/2012   Procedure: TONSILLECTOMY AND ADENOIDECTOMY;  Surgeon: Darletta Moll, MD;  Location: [facility];  Service: ENT;  Laterality: Bilateral;  . TUBAL LIGATION  08/31/2001    OB History    Gravida Para Term Preterm AB Living   5 3 2 1 2 3    SAB TAB Ectopic Multiple Live Births   2       3       Home Medications    Prior to Admission medications   Medication Sig Start Date End Date Taking? Authorizing Provider  albuterol (PROVENTIL HFA;VENTOLIN HFA) 108 (90 Base) MCG/ACT inhaler Inhale 2 puffs into the lungs every 6 (six) hours as needed for wheezing or shortness of breath. 08/21/16   Evon Slack, PA-C  Aspirin-Salicylamide-Caffeine (BC HEADACHE POWDER PO) Take 1 packet by mouth daily as needed.    [provider]  budesonide-formoterol (SYMBICORT) 160-4.5 MCG/ACT inhaler Inhale 2 puffs into the lungs 2 (two) times daily.    [provider]  gabapentin (NEURONTIN) 300 MG capsule Take 600 mg by mouth 3 (three) times daily.    [provider]  GARCINIA CAMBOGIA-CHROMIUM PO Take 1 capsule by mouth daily.    [provider]  HYDROcodone-acetaminophen (NORCO/VICODIN) 5-325 MG tablet Take 1 tablet by mouth every 6 (six) hours as needed for moderate pain. 11/03/17   Zadie Rhine, MD  loratadine (CLARITIN) 10 MG tablet Take 10 mg by mouth daily.    [provider]  methocarbamol (ROBAXIN) 500 MG tablet Take 1 tablet (500 mg total) by mouth 2 (two) times daily. 11/19/17   Priyah Schmuck, Dahlia Client, PA-C  naproxen (NAPROSYN) 500 MG tablet Take 1 tablet (500 mg total) by mouth 2 (two) times daily with a meal. 11/19/17   Royer Cristobal, Dahlia Client, PA-C  ondansetron (ZOFRAN) 8 MG tablet Take 1 tablet (8 mg total) by mouth every 8 (eight) hours as needed for nausea or  vomiting. 05/20/17   Katrinka Blazing, [place], CNM  pantoprazole (PROTONIX) 40 MG tablet Take 40 mg by mouth 2 (two) times daily.    [provider]  RASPBERRY KETONES PO Take 1 tablet by mouth daily.    [provider]  sertraline (ZOLOFT) 100 MG tablet Take 100 mg by mouth daily.    [provider]  sucralfate (CARAFATE) 1 g tablet Take 1 tablet (1 g total) by mouth every 6 (six) hours. 09/04/16   Penny Pia, MD    Family History Family History  Problem Relation Age of Onset  . Cancer Other   . Hypertension Mother   . Heart disease Mother   . Cancer Mother        ovarian  . Cancer Maternal Aunt        not sure primary, in kidney and colon  . Cancer Maternal Uncle        lung  cancer   . Stroke Maternal Grandfather     Social History Social History   Tobacco Use  . Smoking status: Current Some Day Smoker    Packs/day: 0.33    Years: 18.00    Pack years: 5.94    Types: Cigarettes  . Smokeless tobacco: Never Used  Substance Use Topics  . Alcohol use: No    Alcohol/week: 0.0 oz  . Drug use: No    Comment: history of cocaine in 2002; "hit some weed over Thanksgiving"     Allergies   Lidocaine viscous and Penicillins   Review of Systems Review of Systems  Constitutional: Negative for fatigue and fever.  Respiratory: Negative for chest tightness and shortness of breath.   Cardiovascular: Negative for chest pain.  Gastrointestinal: Negative for abdominal pain, diarrhea, nausea and vomiting.  Genitourinary: Negative for dysuria, frequency, hematuria and urgency.  Musculoskeletal: Positive for back pain. Negative for gait problem, joint swelling, neck pain and neck stiffness.  Skin: Negative for rash.  Neurological: Negative for weakness, light-headedness, numbness and headaches.  All other systems reviewed and are negative.    Physical Exam Updated Vital Signs BP (!) 109/58 (BP Location: Right Arm)   Pulse 77   Temp 97.9 F (36.6 C) (Oral)    Resp 17   Ht 5\' 1"  (1.549 m)   Wt 87.1 kg (192 lb)   SpO2 98%   BMI 36.28 kg/m   Physical Exam  Constitutional: She appears well-developed and well-nourished. No distress.  HENT:  Head: Normocephalic and atraumatic.  Mouth/Throat: Oropharynx is clear and moist. No oropharyngeal exudate.  Eyes: Conjunctivae are normal.  Neck: Normal range of motion. Neck supple.  Full ROM without pain  Cardiovascular: Normal rate, regular rhythm and intact distal pulses.  Pulmonary/Chest: Effort normal and breath sounds normal. No respiratory distress. She has no wheezes.  Abdominal: Soft. She exhibits no distension. There is no tenderness.  Musculoskeletal:  No midline tenderness to the cervical or thoracic spine.   Midline and paraspinal tenderness to the lumbar spine. Several areas of ecchymosis to the low back.  No bruising noted to the cervical or thoracic spine.  Full range of motion of bilateral hips knees and ankles.  Lymphadenopathy:    She has no cervical adenopathy.  Neurological: She is alert.  Speech is clear and goal oriented, follows commands Normal 5/5 strength in upper and lower extremities bilaterally including dorsiflexion and plantar flexion, strong and equal grip strength Sensation normal to light and sharp touch Moves extremities without ataxia, coordination intact Normal gait Normal balance No Clonus  Skin: Skin is warm and dry. No rash noted. She is not diaphoretic. No erythema.  Psychiatric: She has a normal mood and affect. Her behavior is normal.  Nursing note and vitals reviewed.    ED Treatments / Results  Labs (all labs ordered are listed, but only abnormal results are displayed) Labs Reviewed  I-STAT BETA HCG BLOOD, ED ([facility], [facility], [facility] ONLY)     Radiology Dg Cervical Spine Complete  Result Date: 11/19/2017 CLINICAL DATA:  Fall EXAM: CERVICAL SPINE - COMPLETE 4+ VIEW COMPARISON:  None. FINDINGS: Straightening of the cervical spine. Dens and lateral masses are  within normal limits. Mild degenerative changes at C6-C7. Normal prevertebral soft tissue thickness IMPRESSION: Straightening of the cervical spine with mild degenerative changes. No radiographic evidence for acute osseous abnormality Electronically Signed   By: Jasmine Pang M.D.   On: 11/19/2017 02:36   Dg Lumbar Spine Complete  Result Date:  11/19/2017 CLINICAL DATA:  Back pain after fall EXAM: LUMBAR SPINE - COMPLETE 4+ VIEW COMPARISON:  CT 05/19/2017 FINDINGS: There is no evidence of lumbar spine fracture. Alignment is normal. Intervertebral disc spaces are maintained. IMPRESSION: Negative. Electronically Signed   By: Jasmine Pang M.D.   On: 11/19/2017 02:38   Dg Pelvis 1-2 Views  Result Date: 11/19/2017 CLINICAL DATA:  Fall EXAM: PELVIS - 1-2 VIEW COMPARISON:  None. FINDINGS: There is no evidence of pelvic fracture or diastasis. No pelvic bone lesions are seen. Small phleboliths in the pelvis IMPRESSION: Negative. Electronically Signed   By: Jasmine Pang M.D.   On: 11/19/2017 02:39    Procedures Procedures (including critical care time)  Medications Ordered in ED Medications  ketorolac (TORADOL) injection 60 mg (60 mg Intramuscular Given 11/19/17 0335)  HYDROcodone-acetaminophen (NORCO/VICODIN) 5-325 MG per tablet 2 tablet (2 tablets Oral Given 11/19/17 0334)  ondansetron (ZOFRAN-ODT) disintegrating tablet 4 mg (4 mg Oral Given 11/19/17 0334)     Initial Impression / Assessment and Plan / ED Course  I have reviewed the triage vital signs and the nursing notes.  Pertinent labs & imaging results that were available during my care of the patient were reviewed by me and considered in my medical decision making (see chart for details).     With back pain after fall.  Plain films are without acute abnormality.  I have personally reviewed the films.  Patient given pain control here in the emergency department.  She ambulates without difficulty.  No evidence of cauda equina.  Patient to be  discharged home with naproxen and Robaxin.  Discussed reasons to return immediately to the emergency department including loss of bowel or bladder control, worsening pain, numbness, tingling or weakness.  Patient states understanding and is in agreement with the plan.  Final Clinical Impressions(s) / ED Diagnoses   Final diagnoses:  Fall  Acute midline low back pain without sciatica  Contusion of lower back, initial encounter  Fall, initial encounter    ED Discharge Orders        Ordered    naproxen (NAPROSYN) 500 MG tablet  2 times daily with meals     11/19/17 0348    methocarbamol (ROBAXIN) 500 MG tablet  2 times daily     11/19/17 0348       Tiwanda Threats, Dahlia Client, PA-C 11/19/17 0407    Zadie Rhine, MD 11/19/17 [phone]

## 2017-11-19 NOTE — ED Triage Notes (Signed)
Patient with back pain and neck pain after a fall.  She states that it happened around 5pm.  She did take a BC powder at home but has not helped.  She states that a stair she was walking down broke and she fell backwards onto her back.

## 2017-11-25 ENCOUNTER — Encounter (HOSPITAL_COMMUNITY): Payer: Self-pay | Admitting: *Deleted

## 2017-11-25 ENCOUNTER — Other Ambulatory Visit: Payer: Self-pay

## 2017-11-25 ENCOUNTER — Emergency Department (HOSPITAL_COMMUNITY)
Admission: EM | Admit: 2017-11-25 | Discharge: 2017-11-26 | Disposition: A | Discharge status: Home / Self Care | Payer: Self-pay | Attending: Emergency Medicine | Admitting: Emergency Medicine

## 2017-11-25 DIAGNOSIS — F1120 Opioid dependence, uncomplicated: Secondary | ICD-10-CM | POA: Insufficient documentation

## 2017-11-25 DIAGNOSIS — J449 Chronic obstructive pulmonary disease, unspecified: Secondary | ICD-10-CM | POA: Insufficient documentation

## 2017-11-25 DIAGNOSIS — R197 Diarrhea, unspecified: Secondary | ICD-10-CM | POA: Insufficient documentation

## 2017-11-25 DIAGNOSIS — R1084 Generalized abdominal pain: Secondary | ICD-10-CM | POA: Insufficient documentation

## 2017-11-25 DIAGNOSIS — F319 Bipolar disorder, unspecified: Secondary | ICD-10-CM | POA: Insufficient documentation

## 2017-11-25 DIAGNOSIS — E119 Type 2 diabetes mellitus without complications: Secondary | ICD-10-CM | POA: Insufficient documentation

## 2017-11-25 DIAGNOSIS — Z9049 Acquired absence of other specified parts of digestive tract: Secondary | ICD-10-CM | POA: Insufficient documentation

## 2017-11-25 DIAGNOSIS — Z79899 Other long term (current) drug therapy: Secondary | ICD-10-CM | POA: Insufficient documentation

## 2017-11-25 DIAGNOSIS — F419 Anxiety disorder, unspecified: Secondary | ICD-10-CM | POA: Insufficient documentation

## 2017-11-25 DIAGNOSIS — R112 Nausea with vomiting, unspecified: Secondary | ICD-10-CM | POA: Insufficient documentation

## 2017-11-25 DIAGNOSIS — F17210 Nicotine dependence, cigarettes, uncomplicated: Secondary | ICD-10-CM | POA: Insufficient documentation

## 2017-11-25 LAB — COMPREHENSIVE METABOLIC PANEL
ALT: 16 U/L (ref 14–54)
AST: 17 U/L (ref 15–41)
Albumin: 3.5 g/dL (ref 3.5–5.0)
Alkaline Phosphatase: 78 U/L (ref 38–126)
Anion gap: 11 (ref 5–15)
BILIRUBIN TOTAL: 0.5 mg/dL (ref 0.3–1.2)
BUN: 10 mg/dL (ref 6–20)
CHLORIDE: 103 mmol/L (ref 101–111)
CO2: 21 mmol/L — ABNORMAL LOW (ref 22–32)
Calcium: 8.8 mg/dL — ABNORMAL LOW (ref 8.9–10.3)
Creatinine, Ser: 0.72 mg/dL (ref 0.44–1.00)
GFR calc Af Amer: >60 mL/min (ref >60)
Glucose, Bld: 125 mg/dL — ABNORMAL HIGH (ref 65–99)
POTASSIUM: 3.4 mmol/L — AB (ref 3.5–5.1)
Sodium: 135 mmol/L (ref 135–145)
TOTAL PROTEIN: 6.5 g/dL (ref 6.5–8.1)

## 2017-11-25 LAB — CBC
HEMATOCRIT: 35.5 % — AB (ref 36.0–46.0)
Hemoglobin: 11.3 g/dL — ABNORMAL LOW (ref 12.0–15.0)
MCH: 26 pg (ref 26.0–34.0)
MCHC: 31.8 g/dL (ref 30.0–36.0)
MCV: 81.6 fL (ref 78.0–100.0)
Platelets: 299 10*3/uL (ref 150–400)
RBC: 4.35 MIL/uL (ref 3.87–5.11)
RDW: 14.4 % (ref 11.5–15.5)
WBC: 9.1 10*3/uL (ref 4.0–10.5)

## 2017-11-25 LAB — LIPASE, BLOOD: LIPASE: 18 U/L (ref 11–51)

## 2017-11-25 LAB — I-STAT BETA HCG BLOOD, ED (MC, WL, AP ONLY): I-stat hCG, quantitative: <5 m[IU]/mL (ref <5)

## 2017-11-25 MED ORDER — ONDANSETRON HCL 4 MG/2ML IJ SOLN
4.0000 mg | Freq: Once | INTRAMUSCULAR | Status: AC | PRN
  Administered 2017-11-25: 4 mg via INTRAVENOUS

## 2017-11-25 MED ORDER — SODIUM CHLORIDE 0.9 % IV BOLUS (SEPSIS)
1000.0000 mL | Freq: Once | INTRAVENOUS | Status: AC
  Administered 2017-11-25: 1000 mL via INTRAVENOUS

## 2017-11-25 MED ORDER — PANTOPRAZOLE SODIUM 40 MG IV SOLR
40.0000 mg | Freq: Once | INTRAVENOUS | Status: AC
  Administered 2017-11-25: 40 mg via INTRAVENOUS
  Filled 2017-11-25: qty 40

## 2017-11-25 MED ORDER — ONDANSETRON HCL 4 MG/2ML IJ SOLN
INTRAMUSCULAR | Status: AC
  Filled 2017-11-25: qty 2

## 2017-11-25 MED ORDER — ONDANSETRON HCL 4 MG/2ML IJ SOLN
4.0000 mg | Freq: Once | INTRAMUSCULAR | Status: AC
Start: 2017-11-25 — End: 2017-11-25
  Administered 2017-11-25: 4 mg via INTRAVENOUS
  Filled 2017-11-25: qty 2

## 2017-11-25 NOTE — ED Triage Notes (Signed)
Pt arrived by EMS from home. Says abdominal pain for the past week & started vomiting today.

## 2017-11-25 NOTE — ED Provider Notes (Signed)
[facility] EMERGENCY DEPARTMENT Provider Note   CSN: [id number] Arrival date & time: 11/25/17  2211     History   Chief Complaint Chief Complaint  Patient presents with  . Abdominal Pain  . Emesis    HPI Shelby Hatfield is a 42 y.o. female.  The history is provided by the patient.  Abdominal Pain   This is a new problem. The current episode started 6 to 12 hours ago. The problem occurs constantly. The problem has been gradually worsening. The pain is located in the epigastric region. The pain is moderate. Associated symptoms include diarrhea, nausea and vomiting. Pertinent negatives include fever and hematochezia. Associated symptoms comments: Chills . The symptoms are aggravated by palpation. Nothing relieves the symptoms.  Emesis   Associated symptoms include abdominal pain, chills and diarrhea. Pertinent negatives include no fever.  Patient with history of COPD, bipolar presents with abdominal pain.  She reports epigastric abdominal pain, nausea/vomiting/diarrhea.  Denies any known blood in her stool.  No fevers but reports chills. She had felt well prior to today.  Past Medical History:  Diagnosis Date  . Adenotonsillar hypertrophy 03/2012   snores during sleep; denies apnea; states occ. wakes up coughing  . Anemia   . Anxiety   . Bipolar 1 disorder (HCC)   . 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 Depression   . Diabetes mellitus without complication (HCC)   . Drug-seeking behavior   . GERD (gastroesophageal reflux disease)   . GI bleed   . Heart failure (HCC)   . Hypercholesterolemia   . Migraines   . Obesity   . Pneumonia   . Wears dentures    upper denture    Patient Active Problem List   Diagnosis Date Noted  . Menorrhagia with regular cycle 06/07/2017  . Cameron ulcer   . Rectal bleeding 06/30/2016  . Chronic diarrhea 06/30/2016  . Gastroesophageal reflux disease with esophagitis   .  Hiatal hernia   . Morbid obesity due to excess calories (HCC)   . Protein-calorie malnutrition, severe 06/02/2016  . PUD (peptic ulcer disease) 06/01/2016  . Abdominal pain, chronic, epigastric 02/12/2016  . 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 Pulmonary nodules 02/12/2016  . Acute on chronic diastolic heart failure (HCC) 01/21/2015  . 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 Lower leg edema 01/19/2015  . Diabetes mellitus type 2 in obese (HCC)   . COPD (chronic obstructive pulmonary disease) (HCC) 08/18/2014  . Chest tightness 08/18/2014  . Opiate addiction (HCC) 05/22/2014  . 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 Opiate dependence, continuous (HCC) 04/20/2013  . Benzodiazepine dependence (HCC) 04/20/2013    Past Surgical History:  Procedure Laterality Date  . APPENDECTOMY    . BIOPSY  02/14/2016   Procedure: BIOPSY;  Surgeon: West Bali, MD;  Location: [facility];  Service: Endoscopy;;  gastric biopsies  . cardiac thoracic surgery    . CESAREAN SECTION  08/31/2001   total of  3  . CHOLECYSTECTOMY    . DILATION AND EVACUATION  04/09/2000  . ESOPHAGEAL DILATION N/A 09/03/2016   Procedure: ESOPHAGEAL DILATION;  Surgeon: Corbin Ade, MD;  Location: [facility];  Service: Endoscopy;  Laterality: N/A;  . ESOPHAGOGASTRODUODENOSCOPY (EGD) WITH PROPOFOL N/A 02/14/2016   Dr. Darrick Penna: Large hiatal hernia, nonbleeding cratered gastric ulcer. Multiple nonbleeding erosions in the gastric fundus. Diffuse moderate inflammation, erosions,  erythema of the gastric antrum. All biopsies benign with no evidence of H. pylori.  . ESOPHAGOGASTRODUODENOSCOPY (EGD) WITH PROPOFOL N/A 06/02/2016   Procedure: ESOPHAGOGASTRODUODENOSCOPY (EGD) WITH PROPOFOL;  Surgeon: Dorena Cookey, MD;  Location: [facility] ENDOSCOPY;  Service: Endoscopy;  Laterality: N/A;  . ESOPHAGOGASTRODUODENOSCOPY (EGD) WITH PROPOFOL N/A 09/03/2016   Procedure: ESOPHAGOGASTRODUODENOSCOPY  (EGD) WITH PROPOFOL;  Surgeon: Corbin Ade, MD;  Location: [facility];  Service: Endoscopy;  Laterality: N/A;  . HIATAL HERNIA REPAIR  10/2015   planning for repeat surgery in 1/18  . open paraesophageal hernia repair with gastroexy  09/2015   Dr. Francee Gentile  . TONSILLECTOMY     adenoidectomy  . TONSILLECTOMY AND ADENOIDECTOMY  04/12/2012   Procedure: TONSILLECTOMY AND ADENOIDECTOMY;  Surgeon: Darletta Moll, MD;  Location: [facility];  Service: ENT;  Laterality: Bilateral;  . TUBAL LIGATION  08/31/2001    OB History    Gravida Para Term Preterm AB Living   5 3 2 1 2 3    SAB TAB Ectopic Multiple Live Births   2       3       Home Medications    Prior to Admission medications   Medication Sig Start Date End Date Taking? Authorizing Provider  albuterol (PROVENTIL HFA;VENTOLIN HFA) 108 (90 Base) MCG/ACT inhaler Inhale 2 puffs into the lungs every 6 (six) hours as needed for wheezing or shortness of breath. 08/21/16  Yes Evon Slack, PA-C  budesonide-formoterol (SYMBICORT) 160-4.5 MCG/ACT inhaler Inhale 2 puffs into the lungs 2 (two) times daily.    [provider]  gabapentin (NEURONTIN) 300 MG capsule Take 600 mg by mouth 3 (three) times daily.    [provider]  HYDROcodone-acetaminophen (NORCO/VICODIN) 5-325 MG tablet Take 1 tablet by mouth every 6 (six) hours as needed for moderate pain. Patient not taking: Reported on 11/25/2017 11/03/17   Zadie Rhine, MD  methocarbamol (ROBAXIN) 500 MG tablet Take 1 tablet (500 mg total) by mouth 2 (two) times daily. Patient not taking: Reported on 11/25/2017 11/19/17   Muthersbaugh, Dahlia Client, PA-C  naproxen (NAPROSYN) 500 MG tablet Take 1 tablet (500 mg total) by mouth 2 (two) times daily with a meal. Patient not taking: Reported on 11/25/2017 11/19/17   Muthersbaugh, Dahlia Client, PA-C  pantoprazole (PROTONIX) 40 MG tablet Take 40 mg by mouth 2 (two) times daily.    [provider]    Family  History Family History  Problem Relation Age of Onset  . Cancer Other   . Hypertension Mother   . Heart disease Mother   . Cancer Mother        ovarian  . Cancer Maternal Aunt        not sure primary, in kidney and colon  . Cancer Maternal Uncle        lung cancer   . Stroke Maternal Grandfather     Social History Social History   Tobacco Use  . Smoking status: Current Some Day Smoker    Packs/day: 0.33    Years: 18.00    Pack years: 5.94    Types: Cigarettes  . Smokeless tobacco: Never Used  Substance Use Topics  . Alcohol use: No    Alcohol/week: 0.0 oz  . Drug use: No    Comment: history of cocaine in 2002; "hit some weed over Thanksgiving"     Allergies   Lidocaine viscous and Penicillins   Review of Systems Review of Systems  Constitutional: Positive for chills and fatigue.  Negative for fever.  Respiratory: Negative for shortness of breath.   Cardiovascular: Negative for chest pain.  Gastrointestinal: Positive for abdominal pain, diarrhea, nausea and vomiting. Negative for hematochezia.  All other systems reviewed and are negative.    Physical Exam Updated Vital Signs BP 133/79   Pulse 84   Temp 99.6 F (37.6 C) (Oral)   Resp 20   Ht 1.549 m (5\' 1" )   Wt 87.1 kg (192 lb)   LMP 11/17/2017   SpO2 98%   BMI 36.28 kg/m   Physical Exam  CONSTITUTIONAL: Well developed/well nourished HEAD: Normocephalic/atraumatic EYES: EOMI/PERRL, no icterus ENMT: Mucous membranes dry NECK: supple no meningeal signs SPINE/BACK:entire spine nontender CV: S1/S2 noted, no murmurs/rubs/gallops noted LUNGS: Lungs are clear to auscultation bilaterally, no apparent distress ABDOMEN: soft, nontender, no rebound or guarding, bowel sounds noted throughout abdomen Well-healed abdominal scar, no hernia noted GU:no cva tenderness NEURO: Pt is awake/alert/appropriate, moves all extremitiesx4.  No facial droop.   EXTREMITIES: pulses normal/equal, full ROM SKIN: warm, color  normal PSYCH: no abnormalities of mood noted, alert and oriented to situation  ED Treatments / Results  Labs (all labs ordered are listed, but only abnormal results are displayed) Labs Reviewed  COMPREHENSIVE METABOLIC PANEL - Abnormal; Notable for the following components:      Result Value   Potassium 3.4 (*)    CO2 21 (*)    Glucose, Bld 125 (*)    Calcium 8.8 (*)    All other components within normal limits  CBC - Abnormal; Notable for the following components:   Hemoglobin 11.3 (*)    HCT 35.5 (*)    All other components within normal limits  URINALYSIS, ROUTINE W REFLEX MICROSCOPIC - Abnormal; Notable for the following components:   Ketones, ur 80 (*)    Leukocytes, UA TRACE (*)    Squamous Epithelial / LPF 0-5 (*)    All other components within normal limits  RAPID URINE DRUG SCREEN, HOSP PERFORMED - Abnormal; Notable for the following components:   Opiates POSITIVE (*)    Tetrahydrocannabinol POSITIVE (*)    All other components within normal limits  LIPASE, BLOOD  I-STAT BETA HCG BLOOD, ED ([facility], [facility], [facility] ONLY)    EKG  EKG Interpretation None       Radiology Ct Abdomen Pelvis W Contrast  Result Date: 11/26/2017 CLINICAL DATA:  Abdominal pain EXAM: CT ABDOMEN AND PELVIS WITH CONTRAST TECHNIQUE: Multidetector CT imaging of the abdomen and pelvis was performed using the standard protocol following bolus administration of intravenous contrast. CONTRAST:  ISOVUE-300 IOPAMIDOL (ISOVUE-300) INJECTION 61% COMPARISON:  05/19/2017 FINDINGS: Lower chest: Lung bases are clear. No effusions. Heart is normal size. Moderate-sized hiatal hernia, status post hernia repair. Hepatobiliary: No focal liver abnormality is seen. Status post cholecystectomy. No biliary dilatation. Pancreas: No focal abnormality or ductal dilatation. Spleen: No focal abnormality.  Normal size. Adrenals/Urinary Tract: No adrenal abnormality. No focal renal abnormality. No stones or hydronephrosis. Urinary  bladder is unremarkable. Stomach/Bowel: Stomach, large and small bowel grossly unremarkable. Vascular/Lymphatic: No evidence of aneurysm or adenopathy. Reproductive: Uterus and adnexa unremarkable.  No mass. Other: No free fluid or free air. Umbilical hernia and supraumbilical hernia both containing fat. A separate supraumbilical ventral hernia more superiorly contains the anterior wall of the transverse colon. Adjacent supraumbilical hernia contains fat. Musculoskeletal: No acute bony abnormality. IMPRESSION: Moderate-sized hiatal hernia.  Prior hiatal hernia repair. Multiple ventral midline hernias containing fat. One of the ventral hernias also contains the anterior  wall of the transverse colon. No evidence of bowel obstruction. Prior cholecystectomy. No acute findings in the abdomen or pelvis. Electronically Signed   By: Charlett NoseKevin  Dover M.D.   On: 11/26/2017 01:52    Procedures Procedures Medications Ordered in ED Medications  fentaNYL (SUBLIMAZE) injection 25 mcg (not administered)  promethazine (PHENERGAN) injection 12.5 mg (not administered)  ondansetron (ZOFRAN) injection 4 mg (4 mg Intravenous Given by Other 11/25/17 2218)  ondansetron (ZOFRAN) injection 4 mg (4 mg Intravenous Given 11/25/17 2337)  sodium chloride 0.9 % bolus 1,000 mL (0 mLs Intravenous Stopped 11/26/17 0025)  pantoprazole (PROTONIX) injection 40 mg (40 mg Intravenous Given 11/25/17 2352)  fentaNYL (SUBLIMAZE) injection 100 mcg (100 mcg Intravenous Given 11/26/17 0114)  promethazine (PHENERGAN) injection 12.5 mg (12.5 mg Intravenous Given 11/26/17 0114)  iopamidol (ISOVUE-300) 61 % injection 100 mL (100 mLs Intravenous Contrast Given 11/26/17 0130)     Initial Impression / Assessment and Plan / ED Course  I have reviewed the triage vital signs and the nursing notes.  Pertinent labs & imaging results that were available during my care of the patient were reviewed by me and considered in my medical decision making (see chart for  details). Narcotic database reviewed and considered in decision making    11:45 PM Patient with nausea vomiting diarrhea and abdominal pain.  Hemodynamically she is appropriate.  She has had previous abdominal surgeries, has also had hiatal hernia We will rehydrate and reassess Most recent EGD in our system reveals history of esophagitis and gastric ulcer 1:01 AM Her ambulating to the bathroom, patient started feeling worse.  She reports increasing nausea and pain.  On repeat exam she has diffuse abdominal tenderness.  She appears to be worsening.  Will proceed with CT imaging.  She has maxed out with Zofran after 3 doses, will give her promethazine as well as fentanyl for pain 2:38 AM CT imaging does not reveal any acute abdominal emergency.  Patient resting comfortably. Will discharge home.  She did have good response to promethazine, will re-dose this and a small dose of fentanyl. Of note, urine drug screen is positive for marijuana and opiates, and she has not been prescribed opiates. Final Clinical Impressions(s) / ED Diagnoses   Final diagnoses:  Nausea vomiting and diarrhea  Generalized abdominal pain    ED Discharge Orders        Ordered    ondansetron (ZOFRAN-ODT) 4 MG disintegrating tablet  Every 8 hours PRN     11/26/17 0237       Zadie RhineWickline, Taron Conrey, MD 11/26/17 [phone]

## 2017-11-26 ENCOUNTER — Emergency Department (HOSPITAL_COMMUNITY): Payer: Self-pay

## 2017-11-26 LAB — URINALYSIS, ROUTINE W REFLEX MICROSCOPIC
Bacteria, UA: NONE SEEN
Bilirubin Urine: NEGATIVE
GLUCOSE, UA: NEGATIVE mg/dL
Hgb urine dipstick: NEGATIVE
Ketones, ur: 80 mg/dL — AB
NITRITE: NEGATIVE
Protein, ur: NEGATIVE mg/dL
Specific Gravity, Urine: 1.019 (ref 1.005–1.030)
pH: 5 (ref 5.0–8.0)

## 2017-11-26 LAB — RAPID URINE DRUG SCREEN, HOSP PERFORMED
AMPHETAMINES: NOT DETECTED
BARBITURATES: NOT DETECTED
BENZODIAZEPINES: NOT DETECTED
Cocaine: NOT DETECTED
Opiates: POSITIVE — AB
Tetrahydrocannabinol: POSITIVE — AB

## 2017-11-26 MED ORDER — PROMETHAZINE HCL 25 MG/ML IJ SOLN
12.5000 mg | Freq: Once | INTRAMUSCULAR | Status: AC
  Administered 2017-11-26: 12.5 mg via INTRAVENOUS
  Filled 2017-11-26: qty 1

## 2017-11-26 MED ORDER — ONDANSETRON 4 MG PO TBDP: 4.0000 mg | ORAL_TABLET | Freq: Three times a day (TID) | ORAL | 0 refills | Status: DC | PRN

## 2017-11-26 MED ORDER — FENTANYL CITRATE (PF) 100 MCG/2ML IJ SOLN
25.0000 ug | Freq: Once | INTRAMUSCULAR | Status: AC
  Administered 2017-11-26: 25 ug via INTRAVENOUS
  Filled 2017-11-26: qty 2

## 2017-11-26 MED ORDER — IOPAMIDOL (ISOVUE-300) INJECTION 61%
100.0000 mL | Freq: Once | INTRAVENOUS | Status: AC | PRN
  Administered 2017-11-26: 100 mL via INTRAVENOUS

## 2017-11-26 MED ORDER — FENTANYL CITRATE (PF) 100 MCG/2ML IJ SOLN
100.0000 ug | Freq: Once | INTRAMUSCULAR | Status: AC
  Administered 2017-11-26: 100 ug via INTRAVENOUS
  Filled 2017-11-26: qty 2

## 2017-11-26 NOTE — ED Notes (Signed)
Checked with pt for urine sample,pt states she can't go right now will try back in 

## 2017-11-26 NOTE — Discharge Instructions (Signed)
°  SEEK IMMEDIATE MEDICAL ATTENTION IF: °The pain does not go away or becomes severe, particularly over the next 8-12 hours.  °A temperature above 100.4F develops.  °Repeated vomiting occurs (multiple episodes).  °The pain becomes localized to portions of the abdomen. The right side could possibly be appendicitis. Blood is being passed in stools or vomit (bright red or black tarry stools).  °Return also if you develop chest pain, difficulty breathing, dizziness or fainting, or become confused, poorly responsive, or inconsolable. ° °

## 2017-11-26 NOTE — ED Notes (Signed)
Pt nauseated again after going to restroom.

## 2017-11-26 NOTE — ED Notes (Signed)
Pt alert & oriented x4, stable gait. Patient given discharge instructions, paperwork & prescription(s). Patient verbalized understanding. Pt left department in wheelchair escorted by staff. Pt left w/ no further questions. 

## 2018-03-12 IMAGING — CR DG FOREARM 2V*R*
2 series · 2 of 2 positions shown · non-contrast
Comparison: None.

CLINICAL DATA: Pain and injury to distal right ulna. Rule out
fracture.

EXAM:
RIGHT FOREARM - 2 VIEW

[forearm ap]
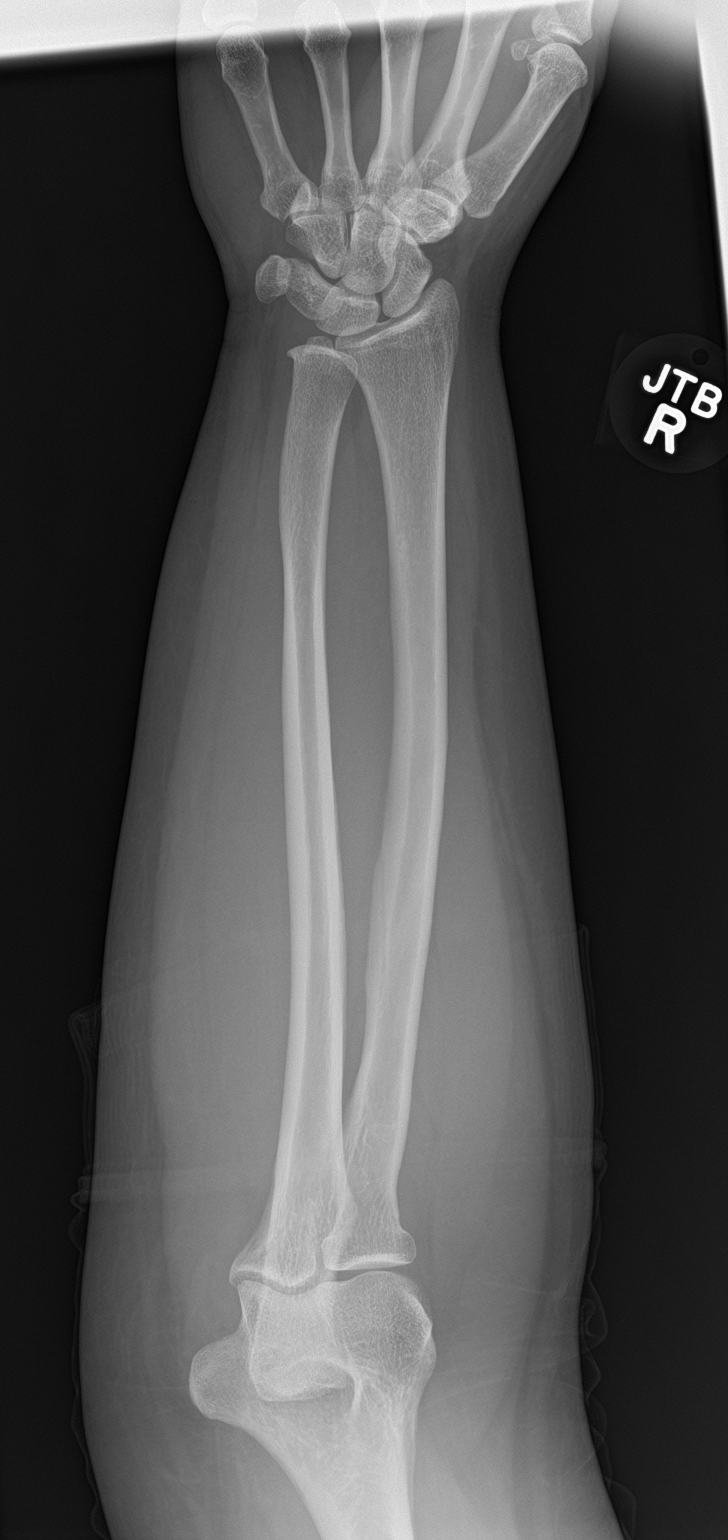

[forearm lat]
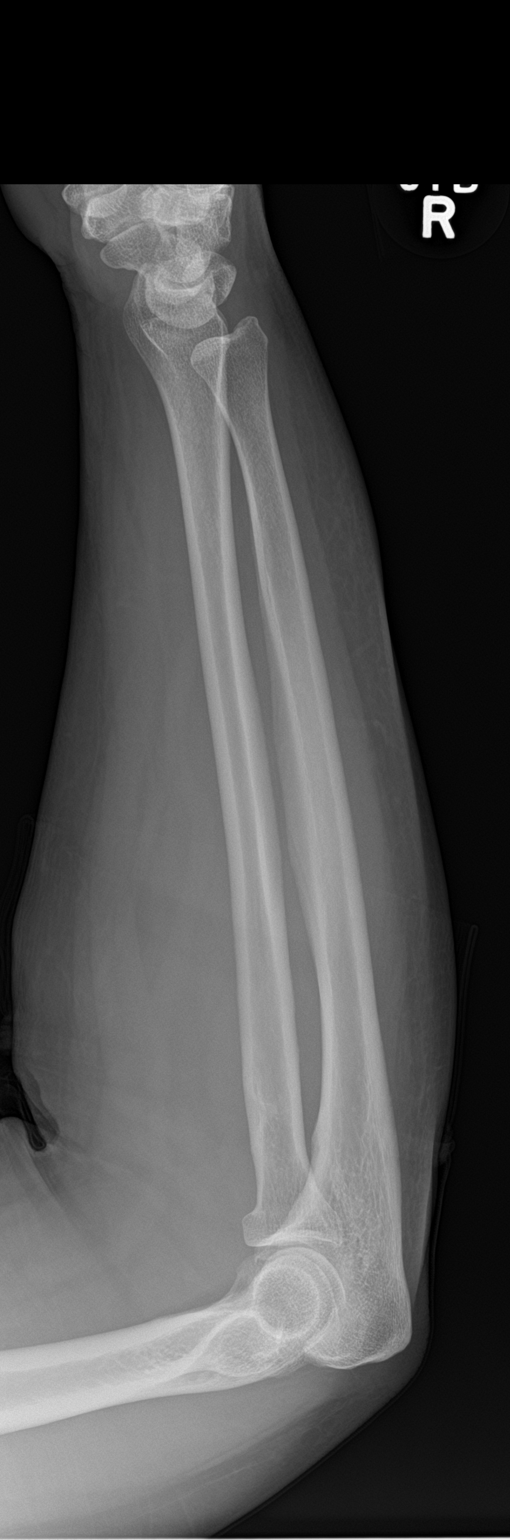

[2 of 2 positions shown; findings below may reference images not displayed]

FINDINGS: There is no evidence of fracture or other focal bone lesions. Soft
tissues are unremarkable.
IMPRESSION: Negative.

## 2018-08-18 ENCOUNTER — Other Ambulatory Visit: Payer: Self-pay

## 2018-08-18 ENCOUNTER — Emergency Department (HOSPITAL_COMMUNITY)
Admission: EM | Admit: 2018-08-18 | Discharge: 2018-08-18 | Disposition: A | Discharge status: Home / Self Care | Payer: Self-pay | Attending: Emergency Medicine | Admitting: Emergency Medicine

## 2018-08-18 ENCOUNTER — Encounter (HOSPITAL_COMMUNITY): Payer: Self-pay | Admitting: Emergency Medicine

## 2018-08-18 ENCOUNTER — Emergency Department (HOSPITAL_COMMUNITY): Payer: Self-pay

## 2018-08-18 DIAGNOSIS — K029 Dental caries, unspecified: Secondary | ICD-10-CM | POA: Insufficient documentation

## 2018-08-18 DIAGNOSIS — J449 Chronic obstructive pulmonary disease, unspecified: Secondary | ICD-10-CM | POA: Insufficient documentation

## 2018-08-18 DIAGNOSIS — Z79899 Other long term (current) drug therapy: Secondary | ICD-10-CM | POA: Insufficient documentation

## 2018-08-18 DIAGNOSIS — F17210 Nicotine dependence, cigarettes, uncomplicated: Secondary | ICD-10-CM | POA: Insufficient documentation

## 2018-08-18 DIAGNOSIS — K047 Periapical abscess without sinus: Secondary | ICD-10-CM | POA: Insufficient documentation

## 2018-08-18 DIAGNOSIS — R220 Localized swelling, mass and lump, head: Secondary | ICD-10-CM

## 2018-08-18 DIAGNOSIS — M272 Inflammatory conditions of jaws: Secondary | ICD-10-CM

## 2018-08-18 DIAGNOSIS — E119 Type 2 diabetes mellitus without complications: Secondary | ICD-10-CM | POA: Insufficient documentation

## 2018-08-18 LAB — CBC WITH DIFFERENTIAL/PLATELET
ABS IMMATURE GRANULOCYTES: 0.09 10*3/uL — AB (ref 0.00–0.07)
BASOS ABS: 0 10*3/uL (ref 0.0–0.1)
Basophils Relative: 0 %
EOS ABS: 0 10*3/uL (ref 0.0–0.5)
Eosinophils Relative: 0 %
HEMATOCRIT: 39.7 % (ref 36.0–46.0)
HEMOGLOBIN: 12.6 g/dL (ref 12.0–15.0)
IMMATURE GRANULOCYTES: 1 %
LYMPHS ABS: 2 10*3/uL (ref 0.7–4.0)
LYMPHS PCT: 14 %
MCH: 25.9 pg — ABNORMAL LOW (ref 26.0–34.0)
MCHC: 31.7 g/dL (ref 30.0–36.0)
MCV: 81.5 fL (ref 80.0–100.0)
MONOS PCT: 7 %
Monocytes Absolute: 1 10*3/uL (ref 0.1–1.0)
NEUTROS ABS: 11.1 10*3/uL — AB (ref 1.7–7.7)
NEUTROS PCT: 78 %
NRBC: 0 % (ref 0.0–0.2)
Platelets: 350 10*3/uL (ref 150–400)
RBC: 4.87 MIL/uL (ref 3.87–5.11)
RDW: 16 % — ABNORMAL HIGH (ref 11.5–15.5)
WBC: 14.2 10*3/uL — ABNORMAL HIGH (ref 4.0–10.5)

## 2018-08-18 LAB — BASIC METABOLIC PANEL
ANION GAP: 5 (ref 5–15)
BUN: <5 mg/dL — ABNORMAL LOW (ref 6–20)
CALCIUM: 9.2 mg/dL (ref 8.9–10.3)
CHLORIDE: 111 mmol/L (ref 98–111)
CO2: 20 mmol/L — AB (ref 22–32)
Creatinine, Ser: 0.85 mg/dL (ref 0.44–1.00)
GFR calc Af Amer: >60 mL/min (ref >60)
GFR calc non Af Amer: >60 mL/min (ref >60)
GLUCOSE: 120 mg/dL — AB (ref 70–99)
POTASSIUM: 3.6 mmol/L (ref 3.5–5.1)
Sodium: 136 mmol/L (ref 135–145)

## 2018-08-18 MED ORDER — ONDANSETRON HCL 4 MG/2ML IJ SOLN
4.0000 mg | Freq: Once | INTRAMUSCULAR | Status: AC
  Administered 2018-08-18: 4 mg via INTRAVENOUS
  Filled 2018-08-18: qty 2

## 2018-08-18 MED ORDER — CLINDAMYCIN PHOSPHATE 600 MG/50ML IV SOLN: 600.0000 mg | Freq: Once | INTRAVENOUS | Status: DC

## 2018-08-18 MED ORDER — HYDROMORPHONE HCL 1 MG/ML IJ SOLN
0.5000 mg | Freq: Once | INTRAMUSCULAR | Status: AC
  Administered 2018-08-18: 0.5 mg via INTRAVENOUS
  Filled 2018-08-18: qty 1

## 2018-08-18 MED ORDER — MORPHINE SULFATE (PF) 4 MG/ML IV SOLN
4.0000 mg | Freq: Once | INTRAVENOUS | Status: AC
  Administered 2018-08-18: 4 mg via INTRAVENOUS
  Filled 2018-08-18: qty 1

## 2018-08-18 MED ORDER — HYDROMORPHONE HCL 1 MG/ML IJ SOLN
1.0000 mg | Freq: Once | INTRAMUSCULAR | Status: AC
  Administered 2018-08-18: 1 mg via INTRAVENOUS
  Filled 2018-08-18: qty 1

## 2018-08-18 MED ORDER — OXYCODONE-ACETAMINOPHEN 5-325 MG PO TABS: 2.0000 | ORAL_TABLET | Freq: Once | ORAL | Status: DC

## 2018-08-18 MED ORDER — IOHEXOL 300 MG/ML  SOLN
100.0000 mL | Freq: Once | INTRAMUSCULAR | Status: AC | PRN
  Administered 2018-08-18: 75 mL via INTRAVENOUS

## 2018-08-18 MED ORDER — CLINDAMYCIN PHOSPHATE 600 MG/50ML IV SOLN
600.0000 mg | Freq: Once | INTRAVENOUS | Status: AC
  Administered 2018-08-18: 600 mg via INTRAVENOUS
  Filled 2018-08-18: qty 50

## 2018-08-18 MED ORDER — CLINDAMYCIN HCL 150 MG PO CAPS
600.0000 mg | ORAL_CAPSULE | Freq: Once | ORAL | Status: AC
  Administered 2018-08-18: 600 mg via ORAL
  Filled 2018-08-18: qty 4

## 2018-08-18 MED ORDER — OXYCODONE-ACETAMINOPHEN 5-325 MG PO TABS
1.0000 | ORAL_TABLET | Freq: Once | ORAL | Status: AC
  Administered 2018-08-18: 1 via ORAL
  Filled 2018-08-18: qty 1

## 2018-08-18 NOTE — Discharge Instructions (Addendum)
You were seen today for a subperiosteal abscess.  Please go straight to the oral surgeon, Dr. Anabel Halonrab's, office so he can drain the abscess.  Return to the ED immediately if you have new or worsening symptoms, difficulty breathing, difficulty swallowing, fevers or any concerns at all.

## 2018-08-18 NOTE — ED Provider Notes (Signed)
[facility] EMERGENCY DEPARTMENT Provider Note   CSN: [id number] Arrival date & time: 08/18/18  4098     History   Chief Complaint Chief Complaint  Patient presents with  . Otalgia  . Facial Swelling    HPI Shelby Hatfield is a 42 y.o. female.  HPI 42 year old female, with past history of dental abscesses, presents with right lower jaw swelling for 1 day.  Patient states symptoms started suddenly last night with right ear pain and have significantly worsened this morning.  She states she last saw her dentist approximate 4 months ago.  Pain today feels the same as previous dental abscesses.  She notes associated pain with swallowing, one episode of vomiting this morning, rhinorrhea, and subjective fevers.  She denies any shortness of breath, difficulty with secretions.  She denies any abdominal pain.  Past Medical History:  Diagnosis Date  . Adenotonsillar hypertrophy 03/2012   snores during sleep; denies apnea; states occ. wakes up coughing  . Anemia   . Anxiety   . Bipolar 1 disorder (HCC)   . 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 Depression   . Diabetes mellitus without complication (HCC)   . Drug-seeking behavior   . GERD (gastroesophageal reflux disease)   . GI bleed   . Heart failure (HCC)   . Hypercholesterolemia   . Migraines   . Obesity   . Pneumonia   . Wears dentures    upper denture    Patient Active Problem List   Diagnosis Date Noted  . Menorrhagia with regular cycle 06/07/2017  . Cameron ulcer   . Rectal bleeding 06/30/2016  . Chronic diarrhea 06/30/2016  . Gastroesophageal reflux disease with esophagitis   . Hiatal hernia   . Morbid obesity due to excess calories (HCC)   . Protein-calorie malnutrition, severe 06/02/2016  . PUD (peptic ulcer disease) 06/01/2016  . Abdominal pain, chronic, epigastric 02/12/2016  . 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 Pulmonary  nodules 02/12/2016  . Acute on chronic diastolic heart failure (HCC) 01/21/2015  . 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 Lower leg edema 01/19/2015  . Diabetes mellitus type 2 in obese (HCC)   . COPD (chronic obstructive pulmonary disease) (HCC) 08/18/2014  . Chest tightness 08/18/2014  . Opiate addiction (HCC) 05/22/2014  . 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 Opiate dependence, continuous (HCC) 04/20/2013  . Benzodiazepine dependence (HCC) 04/20/2013    Past Surgical History:  Procedure Laterality Date  . APPENDECTOMY    . BIOPSY  02/14/2016   Procedure: BIOPSY;  Surgeon: West Bali, MD;  [facility];  Service: Endoscopy;;  gastric biopsies  . cardiac thoracic surgery    . CESAREAN SECTION  08/31/2001   total of  3  . CHOLECYSTECTOMY    . DILATION AND EVACUATION  04/09/2000  . ESOPHAGEAL DILATION N/A 09/03/2016   Procedure: ESOPHAGEAL DILATION;  Surgeon: Corbin Ade, MD;  [facility];  Service: Endoscopy;  Laterality: N/A;  . ESOPHAGOGASTRODUODENOSCOPY (EGD) WITH PROPOFOL N/A 02/14/2016   Dr. Darrick Penna: Large hiatal hernia, nonbleeding cratered gastric ulcer. Multiple nonbleeding erosions in the gastric fundus. Diffuse moderate inflammation, erosions, erythema of the gastric antrum. All biopsies benign with no evidence of H. pylori.  . ESOPHAGOGASTRODUODENOSCOPY (EGD) WITH PROPOFOL N/A 06/02/2016   Procedure: ESOPHAGOGASTRODUODENOSCOPY (EGD) WITH PROPOFOL;  Surgeon: Dorena Cookey, MD;  Location: [facility] ENDOSCOPY;  Service: Endoscopy;  Laterality: N/A;  . ESOPHAGOGASTRODUODENOSCOPY (EGD) WITH PROPOFOL N/A 09/03/2016   Procedure: ESOPHAGOGASTRODUODENOSCOPY (EGD) WITH PROPOFOL;  Surgeon: Corbin Ade, MD;  [facility];  Service: Endoscopy;  Laterality: N/A;  . HIATAL HERNIA REPAIR  10/2015   planning for repeat surgery in 1/18  . open paraesophageal hernia repair with gastroexy  09/2015   Dr. Francee Gentile    . TONSILLECTOMY     adenoidectomy  . TONSILLECTOMY AND ADENOIDECTOMY  04/12/2012   Procedure: TONSILLECTOMY AND ADENOIDECTOMY;  Surgeon: Darletta Moll, MD;  Location: [facility];  Service: ENT;  Laterality: Bilateral;  . TUBAL LIGATION  08/31/2001     OB History    Gravida  5   Para  3   Term  2   Preterm  1   AB  2   Living  3     SAB  2   TAB      Ectopic      Multiple      Live Births  3            Home Medications    Prior to Admission medications   Medication Sig Start Date End Date Taking? Authorizing Provider  albuterol (PROVENTIL HFA;VENTOLIN HFA) 108 (90 Base) MCG/ACT inhaler Inhale 2 puffs into the lungs every 6 (six) hours as needed for wheezing or shortness of breath. 08/21/16   Evon Slack, PA-C  budesonide-formoterol (SYMBICORT) 160-4.5 MCG/ACT inhaler Inhale 2 puffs into the lungs 2 (two) times daily.    [provider]  gabapentin (NEURONTIN) 300 MG capsule Take 600 mg by mouth 3 (three) times daily.    [provider]  ondansetron (ZOFRAN-ODT) 4 MG disintegrating tablet Take 1 tablet (4 mg total) by mouth every 8 (eight) hours as needed. 8mg  ODT q4 hours prn nausea 11/26/17   Zadie Rhine, MD  pantoprazole (PROTONIX) 40 MG tablet Take 40 mg by mouth 2 (two) times daily.    [provider]    Family History Family History  Problem Relation Age of Onset  . Cancer Other   . Hypertension Mother   . Heart disease Mother   . Cancer Mother        ovarian  . Cancer Maternal Aunt        not sure primary, in kidney and colon  . Cancer Maternal Uncle        lung cancer   . Stroke Maternal Grandfather     Social History Social History   Tobacco Use  . Smoking status: Current Some Day Smoker    Packs/day: 0.33    Years: 18.00    Pack years: 5.94    Types: Cigarettes  . Smokeless tobacco: Never Used  Substance Use Topics  . Alcohol use: No    Alcohol/week: 0.0 standard drinks  . Drug use: Yes     Types: Marijuana, Cocaine     Allergies   Lidocaine viscous hcl and Penicillins   Review of Systems Review of Systems  Constitutional: Positive for fever (subjective). Negative for chills.  HENT: Positive for dental problem, ear pain, facial swelling and rhinorrhea. Negative for drooling, sore throat and trouble swallowing.   Eyes: Negative for visual disturbance.  Respiratory: Negative for cough and shortness of breath.   Cardiovascular: Negative for chest pain.  Gastrointestinal: Positive for vomiting (one time this am). Negative for abdominal pain and nausea.     Physical Exam Updated Vital Signs BP 117/87 (BP Location:  Right Arm)   Pulse 92   Temp 99.1 F (37.3 C) (Oral)   Resp 18   Ht 5\' 1"  (1.549 m)   Wt 61.7 kg   SpO2 100%   BMI 25.70 kg/m   Physical Exam  Constitutional: She is oriented to person, place, and time. She appears well-developed and well-nourished.  HENT:  Head:    Right Ear: Tympanic membrane normal.  Left Ear: Tympanic membrane normal.  Nose: Nose normal.  Mouth/Throat: Uvula is midline and oropharynx is clear and moist. No trismus in the jaw. Dental caries present. No uvula swelling.  No sublingual tenderness or swelling.  No swelling of the tongue.  Patient able to protrude tongue without difficulty.  Eyes: Conjunctivae and EOM are normal.  Neck: Neck supple.  Cardiovascular: Normal rate, regular rhythm and normal heart sounds.  No murmur heard. Pulmonary/Chest: Effort normal and breath sounds normal. No respiratory distress. She has no wheezes. She has no rales.  Abdominal: Soft. Bowel sounds are normal. She exhibits no distension. There is no tenderness.  Musculoskeletal: Normal range of motion. She exhibits no tenderness or deformity.  Neurological: She is alert and oriented to person, place, and time.  Skin: Skin is warm and dry. No rash noted. No erythema.  Psychiatric: She has a normal mood and affect. Her behavior is normal.   Nursing note and vitals reviewed.    ED Treatments / Results  Labs (all labs ordered are listed, but only abnormal results are displayed) Labs Reviewed  CBC WITH DIFFERENTIAL/PLATELET  BASIC METABOLIC PANEL    EKG None  Radiology No results found.  Procedures Procedures (including critical care time)  Medications Ordered in ED Medications  morphine 4 MG/ML injection 4 mg (4 mg Intravenous Given 08/18/18 0950)     Initial Impression / Assessment and Plan / ED Course  I have reviewed the triage vital signs and the nursing notes.  Pertinent labs & imaging results that were available during my care of the patient were reviewed by me and considered in my medical decision making (see chart for details).  Clinical Course as of Aug 19 1439  Thu Aug 18, 2018  1113 Slight increase in submandibular swelling. Antibiotics ordered and attending Dr. Anitra LauthPlunkett made aware of pt.   [CK]  1302 Complaining of increased pain.  Will order another dose of pain medicine.  CT shows a subperiosteal abscess.  Oral surgery consulted.  Informed that oral surgery will return call at approximately 2 PM.  He continues to tolerate secretions, no significant increase in facial swelling.   [CK]    Clinical Course User Index [CK] Gillie Crisci S, PA-C    2:40 PM Patient resting comfortably in bed, no acute distress, vital signs stable.  Patient continues to tolerate her own secretions, no respiratory distress.  No change in patient's swelling.  Discussed case with oral surgery, Dr. Kenney Housemanrab.  He reviewed images.  He would like patient to receive 1 more dose of clindamycin 600 mg and then to come to the office for an in office procedure.  Discussed with patient and she is agreeable with plan. Discussed with Dr. Anitra LauthPlunkett who agrees with plan. Will give po clindamycin and discharge.   At this time there does not appear to be any evidence of an acute emergency medical condition and the patient appears stable  for discharge with appropriate outpatient follow up.Diagnosis was discussed with patient who verbalizes understanding and is agreeable to discharge.  Final Clinical Impressions(s) / ED Diagnoses  Final diagnoses:  None    ED Discharge Orders    None       Rueben Bash 08/18/18 1511    Gwyneth Sprout, MD 08/18/18 1525

## 2018-08-18 NOTE — ED Triage Notes (Signed)
Pt reports right ear pain for the last 2 days. Noted right jaw swelling since last night. Reports painful swallowing.

## 2018-09-07 ENCOUNTER — Emergency Department (HOSPITAL_COMMUNITY)
Admission: EM | Admit: 2018-09-07 | Discharge: 2018-09-07 | Disposition: A | Discharge status: Home / Self Care | Payer: Self-pay | Attending: Emergency Medicine | Admitting: Emergency Medicine

## 2018-09-07 ENCOUNTER — Emergency Department (HOSPITAL_COMMUNITY): Payer: Self-pay

## 2018-09-07 ENCOUNTER — Encounter (HOSPITAL_COMMUNITY): Payer: Self-pay | Admitting: Emergency Medicine

## 2018-09-07 DIAGNOSIS — Z79899 Other long term (current) drug therapy: Secondary | ICD-10-CM | POA: Insufficient documentation

## 2018-09-07 DIAGNOSIS — J449 Chronic obstructive pulmonary disease, unspecified: Secondary | ICD-10-CM | POA: Insufficient documentation

## 2018-09-07 DIAGNOSIS — R1013 Epigastric pain: Secondary | ICD-10-CM

## 2018-09-07 DIAGNOSIS — Z7982 Long term (current) use of aspirin: Secondary | ICD-10-CM | POA: Insufficient documentation

## 2018-09-07 DIAGNOSIS — E119 Type 2 diabetes mellitus without complications: Secondary | ICD-10-CM | POA: Insufficient documentation

## 2018-09-07 DIAGNOSIS — K279 Peptic ulcer, site unspecified, unspecified as acute or chronic, without hemorrhage or perforation: Secondary | ICD-10-CM | POA: Insufficient documentation

## 2018-09-07 DIAGNOSIS — F17210 Nicotine dependence, cigarettes, uncomplicated: Secondary | ICD-10-CM | POA: Insufficient documentation

## 2018-09-07 LAB — CBC WITH DIFFERENTIAL/PLATELET
Abs Immature Granulocytes: 0.03 10*3/uL (ref 0.00–0.07)
Basophils Absolute: 0 10*3/uL (ref 0.0–0.1)
Basophils Relative: 0 %
Eosinophils Absolute: 0.1 10*3/uL (ref 0.0–0.5)
Eosinophils Relative: 1 %
HCT: 41.7 % (ref 36.0–46.0)
Hemoglobin: 12.8 g/dL (ref 12.0–15.0)
Immature Granulocytes: 0 %
Lymphocytes Relative: 26 %
Lymphs Abs: 2.4 10*3/uL (ref 0.7–4.0)
MCH: 25.6 pg — ABNORMAL LOW (ref 26.0–34.0)
MCHC: 30.7 g/dL (ref 30.0–36.0)
MCV: 83.4 fL (ref 80.0–100.0)
MONO ABS: 0.5 10*3/uL (ref 0.1–1.0)
Monocytes Relative: 5 %
NRBC: 0 % (ref 0.0–0.2)
Neutro Abs: 6.3 10*3/uL (ref 1.7–7.7)
Neutrophils Relative %: 68 %
Platelets: 377 10*3/uL (ref 150–400)
RBC: 5 MIL/uL (ref 3.87–5.11)
RDW: 16 % — ABNORMAL HIGH (ref 11.5–15.5)
WBC: 9.2 10*3/uL (ref 4.0–10.5)

## 2018-09-07 LAB — COMPREHENSIVE METABOLIC PANEL
ALBUMIN: 3.8 g/dL (ref 3.5–5.0)
ALT: 11 U/L (ref 0–44)
ANION GAP: 9 (ref 5–15)
AST: 14 U/L — AB (ref 15–41)
Alkaline Phosphatase: 94 U/L (ref 38–126)
BILIRUBIN TOTAL: 0.5 mg/dL (ref 0.3–1.2)
BUN: 6 mg/dL (ref 6–20)
CHLORIDE: 107 mmol/L (ref 98–111)
CO2: 22 mmol/L (ref 22–32)
Calcium: 9.5 mg/dL (ref 8.9–10.3)
Creatinine, Ser: 0.71 mg/dL (ref 0.44–1.00)
GFR calc non Af Amer: >60 mL/min (ref >60)
GLUCOSE: 109 mg/dL — AB (ref 70–99)
Potassium: 3.6 mmol/L (ref 3.5–5.1)
SODIUM: 138 mmol/L (ref 135–145)
TOTAL PROTEIN: 7 g/dL (ref 6.5–8.1)

## 2018-09-07 LAB — PROTIME-INR
INR: 0.96
Prothrombin Time: 12.7 seconds (ref 11.4–15.2)

## 2018-09-07 LAB — TYPE AND SCREEN
ABO/RH(D): O POS
Antibody Screen: NEGATIVE

## 2018-09-07 LAB — I-STAT BETA HCG BLOOD, ED (MC, WL, AP ONLY): I-stat hCG, quantitative: <5 m[IU]/mL (ref <5)

## 2018-09-07 LAB — LIPASE, BLOOD: Lipase: 63 U/L — ABNORMAL HIGH (ref 11–51)

## 2018-09-07 LAB — POC OCCULT BLOOD, ED: Fecal Occult Bld: NEGATIVE

## 2018-09-07 MED ORDER — SODIUM CHLORIDE 0.9 % IV BOLUS
1000.0000 mL | Freq: Once | INTRAVENOUS | Status: AC
  Administered 2018-09-07: 1000 mL via INTRAVENOUS

## 2018-09-07 MED ORDER — PANTOPRAZOLE SODIUM 40 MG IV SOLR
40.0000 mg | Freq: Once | INTRAVENOUS | Status: AC
  Administered 2018-09-07: 40 mg via INTRAVENOUS
  Filled 2018-09-07: qty 40

## 2018-09-07 MED ORDER — SUCRALFATE 1 GM/10ML PO SUSP: 1.0000 g | Freq: Three times a day (TID) | ORAL | 0 refills | Status: DC

## 2018-09-07 MED ORDER — ONDANSETRON HCL 4 MG/2ML IJ SOLN
4.0000 mg | Freq: Once | INTRAMUSCULAR | Status: AC
  Administered 2018-09-07: 4 mg via INTRAVENOUS
  Filled 2018-09-07: qty 2

## 2018-09-07 MED ORDER — MORPHINE SULFATE (PF) 4 MG/ML IV SOLN
6.0000 mg | Freq: Once | INTRAVENOUS | Status: AC
  Administered 2018-09-07: 6 mg via INTRAVENOUS
  Filled 2018-09-07: qty 2

## 2018-09-07 MED ORDER — MORPHINE SULFATE (PF) 4 MG/ML IV SOLN
4.0000 mg | Freq: Once | INTRAVENOUS | Status: AC
  Administered 2018-09-07: 4 mg via INTRAVENOUS
  Filled 2018-09-07: qty 1

## 2018-09-07 MED ORDER — FAMOTIDINE 20 MG PO TABS: 20.0000 mg | ORAL_TABLET | Freq: Two times a day (BID) | ORAL | 0 refills | Status: DC

## 2018-09-07 MED ORDER — IOHEXOL 300 MG/ML  SOLN
100.0000 mL | Freq: Once | INTRAMUSCULAR | Status: AC | PRN
  Administered 2018-09-07: 100 mL via INTRAVENOUS

## 2018-09-07 MED ORDER — SUCRALFATE 1 G PO TABS: 1.0000 g | ORAL_TABLET | Freq: Three times a day (TID) | ORAL | 0 refills | Status: DC

## 2018-09-07 NOTE — ED Notes (Signed)
Patient transported to CT 

## 2018-09-07 NOTE — ED Triage Notes (Signed)
Pt arrives with reports of abd pain, N/V for about a week. States she has had hernia repairs and today began vomiting coffee ground emesis.

## 2018-09-07 NOTE — Discharge Instructions (Addendum)
You were seen in the ER for abdomina pain.   Labs and CT did not show any complications or acute process. You did not have microscopic blood in your stool.   I suspect your symptoms are from ulcer related pain or gastritis.   Take omeprazole 40 mg every morning. Additionally, take famotidine 20 mg morning and night. Carafate suspension 20-30 min before every meal and at bedtime.  Avoid irritation foods and drinks such as alcohol, ibuprofen, aspirin, goody powder's, BC powders, fatty, greasy or acidic foods.   Return to ER for constant abdominal pain, fever, chills, chest pain, shortness of breath, black vomit or stools, tearing back or abdominal pain  Follow up with gastroenterology in 1 week for further evaluation of symptoms.

## 2018-09-07 NOTE — ED Provider Notes (Addendum)
[facility] EMERGENCY DEPARTMENT Provider Note   CSN: [id number] Arrival date & time: 09/07/18  1215     History   Chief Complaint Chief Complaint  Patient presents with  . Abdominal Pain  . Nausea    HPI Shelby Hatfield is a 42 y.o. female with history of cholecystectomy, gastritis, PUD, hiatal hernia status post 2 surgeries, GI bleed requiring blood transfusions, anemia on oral iron, tobacco use is here for evaluation of epigastric abdominal pain.  Onset 1 week ago.  Gradually worsening, now severe, described as twisting.  Radiating to LUQ and RUQ.  Worse with eating and palpation.  No alleviating factors.  No interventions.  This morning she vomited what looked like coffee-ground emesis x2.  Her stools are light brown and looser, no melena.  She has associated lightheadedness with movement.  She denies EtOH use but states in the last week she has been taking 3 Goody powders a day to help with the pain.  Last EGD 2017 showed esophagitis, large hiatal hernia, large gastric ulcer. Has been compliant with protonix.   HPI  Past Medical History:  Diagnosis Date  . Adenotonsillar hypertrophy 03/2012   snores during sleep; denies apnea; states occ. wakes up coughing  . Anemia   . Anxiety   . Bipolar 1 disorder (HCC)   . 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 Depression   . Diabetes mellitus without complication (HCC)   . Drug-seeking behavior   . GERD (gastroesophageal reflux disease)   . GI bleed   . Heart failure (HCC)   . Hypercholesterolemia   . Migraines   . Obesity   . Pneumonia   . Wears dentures    upper denture    Patient Active Problem List   Diagnosis Date Noted  . Menorrhagia with regular cycle 06/07/2017  . Cameron ulcer   . Rectal bleeding 06/30/2016  . Chronic diarrhea 06/30/2016  . Gastroesophageal reflux disease with esophagitis   . Hiatal hernia   . Morbid obesity due to  excess calories (HCC)   . Protein-calorie malnutrition, severe 06/02/2016  . PUD (peptic ulcer disease) 06/01/2016  . Abdominal pain, chronic, epigastric 02/12/2016  . 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 Pulmonary nodules 02/12/2016  . Acute on chronic diastolic heart failure (HCC) 01/21/2015  . 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 Lower leg edema 01/19/2015  . Diabetes mellitus type 2 in obese (HCC)   . COPD (chronic obstructive pulmonary disease) (HCC) 08/18/2014  . Chest tightness 08/18/2014  . Opiate addiction (HCC) 05/22/2014  . 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 Opiate dependence, continuous (HCC) 04/20/2013  . Benzodiazepine dependence (HCC) 04/20/2013    Past Surgical History:  Procedure Laterality Date  . APPENDECTOMY    . BIOPSY  02/14/2016   Procedure: BIOPSY;  Surgeon: West Bali, MD;  [facility];  Service: Endoscopy;;  gastric biopsies  . cardiac thoracic surgery    . CESAREAN SECTION  08/31/2001   total of  3  . CHOLECYSTECTOMY    . DILATION AND EVACUATION  04/09/2000  . ESOPHAGEAL DILATION N/A 09/03/2016   Procedure: ESOPHAGEAL DILATION;  Surgeon: Corbin Ade, MD;  [facility];  Service: Endoscopy;  Laterality: N/A;  . ESOPHAGOGASTRODUODENOSCOPY (EGD) WITH PROPOFOL N/A 02/14/2016   Dr. Darrick Penna: Large hiatal hernia, nonbleeding cratered gastric ulcer. Multiple nonbleeding erosions in the gastric  fundus. Diffuse moderate inflammation, erosions, erythema of the gastric antrum. All biopsies benign with no evidence of H. pylori.  . ESOPHAGOGASTRODUODENOSCOPY (EGD) WITH PROPOFOL N/A 06/02/2016   Procedure: ESOPHAGOGASTRODUODENOSCOPY (EGD) WITH PROPOFOL;  Surgeon: Dorena Cookey, MD;  Location: [facility] ENDOSCOPY;  Service: Endoscopy;  Laterality: N/A;  . ESOPHAGOGASTRODUODENOSCOPY (EGD) WITH PROPOFOL N/A 09/03/2016   Procedure: ESOPHAGOGASTRODUODENOSCOPY (EGD) WITH PROPOFOL;  Surgeon: Corbin Ade, MD;  [facility];  Service: Endoscopy;  Laterality: N/A;  . HIATAL HERNIA REPAIR  10/2015   planning for repeat surgery in 1/18  . open paraesophageal hernia repair with gastroexy  09/2015   Dr. Francee Gentile  . TONSILLECTOMY     adenoidectomy  . TONSILLECTOMY AND ADENOIDECTOMY  04/12/2012   Procedure: TONSILLECTOMY AND ADENOIDECTOMY;  Surgeon: Darletta Moll, MD;  Location: [facility];  Service: ENT;  Laterality: Bilateral;  . TUBAL LIGATION  08/31/2001     OB History    Gravida  5   Para  3   Term  2   Preterm  1   AB  2   Living  3     SAB  2   TAB      Ectopic      Multiple      Live Births  3            Home Medications    Prior to Admission medications   Medication Sig Start Date End Date Taking? Authorizing Provider  albuterol (PROVENTIL HFA;VENTOLIN HFA) 108 (90 Base) MCG/ACT inhaler Inhale 2 puffs into the lungs every 6 (six) hours as needed for wheezing or shortness of breath. Patient not taking: Reported on 08/18/2018 08/21/16   Evon Slack, PA-C  Aspirin-Salicylamide-Caffeine [facility] HEADACHE PO) Take 1 packet by mouth daily as needed (headache).    [provider]  famotidine (PEPCID) 20 MG tablet Take 1 tablet (20 mg total) by mouth 2 (two) times daily. 09/07/18   Liberty Handy, PA-C  ondansetron (ZOFRAN-ODT) 4 MG disintegrating tablet Take 1 tablet (4 mg total) by mouth every 8 (eight) hours as needed. 8mg  ODT q4 hours prn nausea Patient not taking: Reported on 08/18/2018 11/26/17   Zadie Rhine, MD  sucralfate (CARAFATE) 1 GM/10ML suspension Take 10 mLs (1 g total) by mouth 4 (four) times daily -  with meals and at bedtime. 09/07/18   Liberty Handy, PA-C    Family History Family History  Problem Relation Age of Onset  . Cancer Other   . Hypertension Mother   . Heart disease Mother   . Cancer Mother        ovarian  . Cancer Maternal Aunt        not sure primary, in kidney and colon  .  Cancer Maternal Uncle        lung cancer   . Stroke Maternal Grandfather     Social History Social History   Tobacco Use  . Smoking status: Current Some Day Smoker    Packs/day: 0.33    Years: 18.00    Pack years: 5.94    Types: Cigarettes  . Smokeless tobacco: Never Used  Substance Use Topics  . Alcohol use: No    Alcohol/week: 0.0 standard drinks  . Drug use: Yes    Types: Marijuana, Cocaine     Allergies   Lidocaine viscous hcl and Penicillins   Review of Systems Review of Systems  Gastrointestinal: Positive for abdominal pain, nausea and vomiting.  Coffee ground emesis  All other systems reviewed and are negative.    Physical Exam Updated Vital Signs BP 106/63   Pulse (!) 59   Temp 98.7 F (37.1 C) (Oral)   Resp 18   Ht 5\' 1"  (1.549 m)   Wt 59.9 kg   SpO2 99%   BMI 24.94 kg/m   Physical Exam  Constitutional: She is oriented to person, place, and time. She appears well-developed and well-nourished.  Non toxic  HENT:  Head: Normocephalic and atraumatic.  Nose: Nose normal.  No conjunctival pallor   Eyes: Pupils are equal, round, and reactive to light. Conjunctivae and EOM are normal.  Neck: Normal range of motion.  Cardiovascular: Normal rate and regular rhythm.  1+ radial and DP pulses bilaterally   Pulmonary/Chest: Effort normal and breath sounds normal.  Abdominal: Soft. Bowel sounds are normal. There is tenderness in the epigastric area.  No G/R/R. No suprapubic or CVA tenderness. Negative Murphy's and McBurney's. Active BS to lower quadrants.   Genitourinary:  Genitourinary Comments:  Chaperone was present.  There are no external fissures or external hemorrhoids noted.  No induration or swelling of the perianal skin.   Stool color is brown with no gross blood noted.   No signs of perirectal abscess.   DRE reveals good sphincter tone.    Musculoskeletal: Normal range of motion.  Neurological: She is alert and oriented to person, place,  and time.  Skin: Skin is warm and dry. Capillary refill takes less than 2 seconds.  Psychiatric: She has a normal mood and affect. Her behavior is normal.  Nursing note and vitals reviewed.    ED Treatments / Results  Labs (all labs ordered are listed, but only abnormal results are displayed) Labs Reviewed  COMPREHENSIVE METABOLIC PANEL - Abnormal; Notable for the following components:      Result Value   Glucose, Bld 109 (*)    AST 14 (*)    All other components within normal limits  CBC WITH DIFFERENTIAL/PLATELET - Abnormal; Notable for the following components:   MCH 25.6 (*)    RDW 16.0 (*)    All other components within normal limits  LIPASE, BLOOD - Abnormal; Notable for the following components:   Lipase 63 (*)    All other components within normal limits  PROTIME-INR  RAPID URINE DRUG SCREEN, HOSP PERFORMED  URINALYSIS, ROUTINE W REFLEX MICROSCOPIC  I-STAT BETA HCG BLOOD, ED ([facility], [facility], [facility] ONLY)  POC OCCULT BLOOD, ED  TYPE AND SCREEN    EKG None  Radiology Ct Abdomen Pelvis W Contrast  Result Date: 09/07/2018 CLINICAL DATA:  Epigastric pain, history of gastric ulcer EXAM: CT ABDOMEN AND PELVIS WITH CONTRAST TECHNIQUE: Multidetector CT imaging of the abdomen and pelvis was performed using the standard protocol following bolus administration of intravenous contrast. CONTRAST:  100mL OMNIPAQUE IOHEXOL 300 MG/ML  SOLN COMPARISON:  11/26/2017 FINDINGS: Lower chest: No acute abnormality. Hepatobiliary: No focal liver abnormality is seen. Status post cholecystectomy. No biliary dilatation. Pancreas: Unremarkable. No pancreatic ductal dilatation or surrounding inflammatory changes. Spleen: Normal in size without focal abnormality. Adrenals/Urinary Tract: Adrenal glands are unremarkable. Kidneys are normal, without renal calculi, focal lesion, or hydronephrosis. Bladder is unremarkable. Stomach/Bowel: Moderate-sized hiatal hernia. Prior appendectomy. No pneumatosis, pneumoperitoneum  or portal venous gas. Diverticulosis without evidence of diverticulitis. No evidence of bowel wall thickening, distention, or inflammatory changes. Vascular/Lymphatic: No significant vascular findings are present. No enlarged abdominal or pelvic lymph nodes. Reproductive: Uterus and bilateral adnexa are unremarkable.  Other: Small fat containing umbilical hernia. Musculoskeletal: No acute osseous abnormality. No aggressive osseous lesion. IMPRESSION: 1. No acute abdominal or pelvic pathology. 2. Fat containing umbilical hernia. Electronically Signed   By: Elige Ko   On: 09/07/2018 15:37    Procedures Procedures (including critical care time)  Medications Ordered in ED Medications  pantoprazole (PROTONIX) injection 40 mg (40 mg Intravenous Given 09/07/18 1254)  ondansetron (ZOFRAN) injection 4 mg (4 mg Intravenous Given 09/07/18 1248)  morphine 4 MG/ML injection 4 mg (4 mg Intravenous Given 09/07/18 1251)  sodium chloride 0.9 % bolus 1,000 mL (0 mLs Intravenous Stopped 09/07/18 1519)  morphine 4 MG/ML injection 6 mg (6 mg Intravenous Given 09/07/18 1442)  iohexol (OMNIPAQUE) 300 MG/ML solution 100 mL (100 mLs Intravenous Contrast Given 09/07/18 1525)     Initial Impression / Assessment and Plan / ED Course  I have reviewed the triage vital signs and the nursing notes.  Pertinent labs & imaging results that were available during my care of the patient were reviewed by me and considered in my medical decision making (see chart for details).   Given h/o highest on ddx is PUD vs gastritis vs perforated ulcer vs hiatal hernia complication.  Last EGD 2017 shows ulcer, gastritis.  Given location, also considering pancreatitis, MW tear.  S/p cholecystectomy. I considered ACS less likely, there is no radiation into CP, SOB.   1350: Pt improved on exam after meds. LFTs, creatinine, unremarkable. Lipase only mildly elevated.  I reviewed pt's last CTAP March 2019, she has multiple ventral midline  hernias containing fat and colon.  Given this, h/o gastric ulcer, she is considered high risk. We will proceed with CTAP to r/o intraabd emergent process. Hemoccult negative.   1540: CTAP negative for acute intra abd process.  Pain improved in ER.  I suspect her pain is from PUD/GERD/gastritis.  Dc with GI f/u, carafte, omeprazole, pepcid, diet changes. Return precautions given. Pt is agreement.  Final Clinical Impressions(s) / ED Diagnoses   Final diagnoses:  Epigastric abdominal pain  Peptic ulcer disease    ED Discharge Orders         Ordered    sucralfate (CARAFATE) 1 GM/10ML suspension  3 times daily with meals & bedtime     09/07/18 1539    famotidine (PEPCID) 20 MG tablet  2 times daily     09/07/18 1539             Jerrell Mylar 09/07/18 1544    Azalia Bilis, MD 09/09/18 [phone]

## 2018-09-07 NOTE — ED Notes (Signed)
Pt verbalized understanding of discharge paperwork and prescriptions.  °

## 2018-10-22 ENCOUNTER — Emergency Department (HOSPITAL_COMMUNITY)
Admission: EM | Admit: 2018-10-22 | Discharge: 2018-10-22 | Disposition: A | Discharge status: Home / Self Care | Payer: Self-pay | Attending: Emergency Medicine | Admitting: Emergency Medicine

## 2018-10-22 ENCOUNTER — Encounter (HOSPITAL_COMMUNITY): Payer: Self-pay

## 2018-10-22 DIAGNOSIS — E119 Type 2 diabetes mellitus without complications: Secondary | ICD-10-CM | POA: Insufficient documentation

## 2018-10-22 DIAGNOSIS — E7800 Pure hypercholesterolemia, unspecified: Secondary | ICD-10-CM | POA: Insufficient documentation

## 2018-10-22 DIAGNOSIS — J449 Chronic obstructive pulmonary disease, unspecified: Secondary | ICD-10-CM | POA: Insufficient documentation

## 2018-10-22 DIAGNOSIS — L0291 Cutaneous abscess, unspecified: Secondary | ICD-10-CM

## 2018-10-22 DIAGNOSIS — L02416 Cutaneous abscess of left lower limb: Secondary | ICD-10-CM | POA: Insufficient documentation

## 2018-10-22 DIAGNOSIS — F17210 Nicotine dependence, cigarettes, uncomplicated: Secondary | ICD-10-CM | POA: Insufficient documentation

## 2018-10-22 DIAGNOSIS — Z79899 Other long term (current) drug therapy: Secondary | ICD-10-CM | POA: Insufficient documentation

## 2018-10-22 DIAGNOSIS — I5032 Chronic diastolic (congestive) heart failure: Secondary | ICD-10-CM | POA: Insufficient documentation

## 2018-10-22 MED ORDER — LIDOCAINE HCL 2 % IJ SOLN
20.0000 mL | Freq: Once | INTRAMUSCULAR | Status: AC
  Administered 2018-10-22: 400 mg via INTRADERMAL
  Filled 2018-10-22: qty 20

## 2018-10-22 MED ORDER — HYDROCODONE-ACETAMINOPHEN 5-325 MG PO TABS: 1.0000 | ORAL_TABLET | ORAL | 0 refills | Status: DC | PRN

## 2018-10-22 MED ORDER — DOXYCYCLINE HYCLATE 100 MG PO CAPS: 100.0000 mg | ORAL_CAPSULE | Freq: Two times a day (BID) | ORAL | 0 refills | Status: DC

## 2018-10-22 MED ORDER — IBUPROFEN 800 MG PO TABS: 800.0000 mg | ORAL_TABLET | Freq: Three times a day (TID) | ORAL | 0 refills | Status: DC | PRN

## 2018-10-22 MED ORDER — OXYCODONE-ACETAMINOPHEN 5-325 MG PO TABS
1.0000 | ORAL_TABLET | Freq: Once | ORAL | Status: AC
  Administered 2018-10-22: 1 via ORAL
  Filled 2018-10-22: qty 1

## 2018-10-22 NOTE — Discharge Instructions (Signed)
Return here as needed.  Keep the area around clean.  Use heat over the area.  Have packing removed in 2 days.

## 2018-10-22 NOTE — ED Triage Notes (Signed)
Pt reports abscess to left inner thigh 4 days ago with thick, white drainage today.

## 2018-10-22 NOTE — ED Notes (Signed)
Declined W/C at D/C and was escorted to lobby by RN. 

## 2018-10-22 NOTE — ED Provider Notes (Signed)
[name] Provider Note   CSN: [id number] Arrival date & time: 10/22/18  1629     History   Chief Complaint Chief Complaint  Patient presents with  . Abscess    HPI Shelby Hatfield DoctorS He is a 43 y.o. female.  HPI Patient presents to the emergency department with an abscess to the left medial upper thigh.  She states that she noticed this 3 days ago.  She states that there has been some drainage from the area.  Patient states she did not take any medications or use any treatments on the area.  Patient denies fever, nausea, vomiting, weakness, dizziness, blurred vision, near-syncope or syncope. Past Medical History:  Diagnosis Date  . Adenotonsillar hypertrophy 03/2012   snores during sleep; denies apnea; states occ. wakes up coughing  . Anemia   . Anxiety   . Bipolar 1 disorder (HCC)   . 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 Depression   . Diabetes mellitus without complication (HCC)   . Drug-seeking behavior   . GERD (gastroesophageal reflux disease)   . GI bleed   . Heart failure (HCC)   . Hypercholesterolemia   . Migraines   . Obesity   . Pneumonia   . Wears dentures    upper denture    Patient Active Problem List   Diagnosis Date Noted  . Menorrhagia with regular cycle 06/07/2017  . Cameron ulcer   . Rectal bleeding 06/30/2016  . Chronic diarrhea 06/30/2016  . Gastroesophageal reflux disease with esophagitis   . Hiatal hernia   . Morbid obesity due to excess calories (HCC)   . Protein-calorie malnutrition, severe 06/02/2016  . PUD (peptic ulcer disease) 06/01/2016  . Abdominal pain, chronic, epigastric 02/12/2016  . 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 Pulmonary nodules 02/12/2016  . Acute on chronic diastolic heart failure (HCC) 01/21/2015  . 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 Lower leg edema 01/19/2015  . Diabetes mellitus type 2 in obese (HCC)   .  COPD (chronic obstructive pulmonary disease) (HCC) 08/18/2014  . Chest tightness 08/18/2014  . Opiate addiction (HCC) 05/22/2014  . 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 Opiate dependence, continuous (HCC) 04/20/2013  . Benzodiazepine dependence (HCC) 04/20/2013    Past Surgical History:  Procedure Laterality Date  . APPENDECTOMY    . BIOPSY  02/14/2016   Procedure: BIOPSY;  Surgeon: West BaliSandi L Fields, MD;  [facility]: [facility];  Service: Endoscopy;;  gastric biopsies  . cardiac thoracic surgery    . CESAREAN SECTION  08/31/2001   total of  3  . CHOLECYSTECTOMY    . DILATION AND EVACUATION  04/09/2000  . ESOPHAGEAL DILATION N/A 09/03/2016   Procedure: ESOPHAGEAL DILATION;  Surgeon: Corbin Adeobert M Rourk, MD;  [facility]: [facility];  Service: Endoscopy;  Laterality: N/A;  . ESOPHAGOGASTRODUODENOSCOPY (EGD) WITH PROPOFOL N/A 02/14/2016   Dr. Darrick Pennafields: Large hiatal hernia, nonbleeding cratered gastric ulcer. Multiple nonbleeding erosions in the gastric fundus. Diffuse moderate inflammation, erosions, erythema of the gastric antrum. All biopsies benign with no evidence of H. pylori.  . ESOPHAGOGASTRODUODENOSCOPY (EGD) WITH PROPOFOL N/A 06/02/2016   Procedure: ESOPHAGOGASTRODUODENOSCOPY (EGD) WITH PROPOFOL;  Surgeon: Dorena CookeyJohn Hayes, MD;  [facility];  Service: Endoscopy;  Laterality: N/A;  . ESOPHAGOGASTRODUODENOSCOPY (EGD) WITH PROPOFOL N/A 09/03/2016   Procedure: ESOPHAGOGASTRODUODENOSCOPY (EGD) WITH PROPOFOL;  Surgeon: Corbin Adeobert M Rourk, MD;  [facility];  Service: Endoscopy;  Laterality: N/A;  . HIATAL HERNIA REPAIR  10/2015   planning for repeat surgery in 1/18  . open paraesophageal hernia repair with gastroexy  09/2015   Dr. Francee Gentile  . TONSILLECTOMY     adenoidectomy  . TONSILLECTOMY AND ADENOIDECTOMY  04/12/2012   Procedure: TONSILLECTOMY AND ADENOIDECTOMY;  Surgeon: Darletta Moll, MD;  Location: [facility];   Service: ENT;  Laterality: Bilateral;  . TUBAL LIGATION  08/31/2001     OB History    Gravida  5   Para  3   Term  2   Preterm  1   AB  2   Living  3     SAB  2   TAB      Ectopic      Multiple      Live Births  3            Home Medications    Prior to Admission medications   Medication Sig Start Date End Date Taking? Authorizing Provider  albuterol (PROVENTIL HFA;VENTOLIN HFA) 108 (90 Base) MCG/ACT inhaler Inhale 2 puffs into the lungs every 6 (six) hours as needed for wheezing or shortness of breath. Patient not taking: Reported on 08/18/2018 08/21/16   Evon Slack, PA-C  Aspirin-Salicylamide-Caffeine [facility] HEADACHE PO) Take 1 packet by mouth daily as needed (headache).    [provider]  famotidine (PEPCID) 20 MG tablet Take 1 tablet (20 mg total) by mouth 2 (two) times daily. 09/07/18   Liberty Handy, PA-C  ondansetron (ZOFRAN-ODT) 4 MG disintegrating tablet Take 1 tablet (4 mg total) by mouth every 8 (eight) hours as needed. 8mg  ODT q4 hours prn nausea Patient not taking: Reported on 08/18/2018 11/26/17   Zadie Rhine, MD  sucralfate (CARAFATE) 1 g tablet Take 1 tablet (1 g total) by mouth 4 (four) times daily -  with meals and at bedtime. 09/07/18 10/07/18  Liberty Handy, PA-C  sucralfate (CARAFATE) 1 GM/10ML suspension Take 10 mLs (1 g total) by mouth 4 (four) times daily -  with meals and at bedtime. 09/07/18   Liberty Handy, PA-C    Family History Family History  Problem Relation Age of Onset  . Cancer Other   . Hypertension Mother   . Heart disease Mother   . Cancer Mother        ovarian  . Cancer Maternal Aunt        not sure primary, in kidney and colon  . Cancer Maternal Uncle        lung cancer   . Stroke Maternal Grandfather     Social History Social History   Tobacco Use  . Smoking status: Current Some Day Smoker    Packs/day: 0.33    Years: 18.00    Pack years: 5.94    Types: Cigarettes  . Smokeless  tobacco: Never Used  Substance Use Topics  . Alcohol use: No    Alcohol/week: 0.0 standard drinks  . Drug use: Yes    Types: Marijuana, Cocaine     Allergies   Lidocaine viscous hcl and Penicillins   Review of Systems Review of Systems All other systems negative except as documented in the HPI. All pertinent positives and negatives as reviewed in the HPI.  Physical Exam Updated Vital Signs BP 120/72 (BP Location: Right Arm)   Pulse 99   Temp 98.5 F (36.9 C) (Oral)   Resp 16   SpO2 99%  Physical Exam Vitals signs and nursing note reviewed.  Constitutional:      General: She is not in acute distress.    Appearance: She is well-developed.  HENT:     Head: Normocephalic and atraumatic.  Eyes:     Pupils: Pupils are equal, round, and reactive to light.  Pulmonary:     Effort: Pulmonary effort is normal.  Genitourinary:   Skin:    General: Skin is warm and dry.  Neurological:     Mental Status: She is alert and oriented to person, place, and time.      ED Treatments / Results  Labs (all labs ordered are listed, but only abnormal results are displayed) Labs Reviewed - No data to display  EKG None  Radiology No results found.  Procedures Procedures (including critical care time)  Medications Ordered in ED Medications  oxyCODONE-acetaminophen (PERCOCET/ROXICET) 5-325 MG per tablet 1 tablet (has no administration in time range)  lidocaine (XYLOCAINE) 2 % (with pres) injection 400 mg (400 mg Intradermal Given 10/22/18 1722)     Initial Impression / Assessment and Plan / ED Course  I have reviewed the triage vital signs and the nursing notes.  Pertinent labs & imaging results that were available during my care of the patient were reviewed by me and considered in my medical decision making (see chart for details).    INCISION AND DRAINAGE Performed by: Jamesetta Orleans Massiah Longanecker Consent: Verbal consent obtained. Risks and benefits: risks, benefits and  alternatives were discussed Type: abscess  Body area: L upper medial thigh   Anesthesia: local infiltration  Incision was made with a scalpel.  Local anesthetic: lidocaine 2% wo epinephrine  Anesthetic total: 6 ml  Complexity: complex Blunt dissection to break up loculations  Drainage: purulent  Drainage amount: moderate  Packing material: 1/4 in iodoform gauze  Patient tolerance: Patient tolerated the procedure well with no immediate complications.    Patient advised to have the area rechecked in 2 days.  Told to keep the area clean and dry and covered.  Also advised her to use warm heat around the area.  Antibiotics given due to the fact she has surrounding cellulitis. Final Clinical Impressions(s) / ED Diagnoses   Final diagnoses:  None    ED Discharge Orders    None       Charlestine Night, PA-C 10/26/18 2340    Margarita Grizzle, MD 10/27/18 [phone]

## 2019-09-19 ENCOUNTER — Emergency Department: Payer: Self-pay

## 2019-09-19 ENCOUNTER — Emergency Department
Admission: EM | Admit: 2019-09-19 | Discharge: 2019-09-19 | Disposition: A | Discharge status: Home / Self Care | Payer: Self-pay | Attending: Emergency Medicine | Admitting: Emergency Medicine

## 2019-09-19 ENCOUNTER — Other Ambulatory Visit: Payer: Self-pay

## 2019-09-19 ENCOUNTER — Encounter: Payer: Self-pay | Admitting: Emergency Medicine

## 2019-09-19 DIAGNOSIS — J449 Chronic obstructive pulmonary disease, unspecified: Secondary | ICD-10-CM | POA: Insufficient documentation

## 2019-09-19 DIAGNOSIS — F17210 Nicotine dependence, cigarettes, uncomplicated: Secondary | ICD-10-CM | POA: Insufficient documentation

## 2019-09-19 DIAGNOSIS — I5032 Chronic diastolic (congestive) heart failure: Secondary | ICD-10-CM | POA: Insufficient documentation

## 2019-09-19 DIAGNOSIS — L03019 Cellulitis of unspecified finger: Secondary | ICD-10-CM

## 2019-09-19 DIAGNOSIS — E119 Type 2 diabetes mellitus without complications: Secondary | ICD-10-CM | POA: Insufficient documentation

## 2019-09-19 DIAGNOSIS — Z79899 Other long term (current) drug therapy: Secondary | ICD-10-CM | POA: Insufficient documentation

## 2019-09-19 DIAGNOSIS — L03012 Cellulitis of left finger: Secondary | ICD-10-CM | POA: Insufficient documentation

## 2019-09-19 MED ORDER — LIDOCAINE HCL (PF) 1 % IJ SOLN
5.0000 mL | Freq: Once | INTRAMUSCULAR | Status: AC
  Administered 2019-09-19: 5 mL
  Filled 2019-09-19: qty 5

## 2019-09-19 MED ORDER — SULFAMETHOXAZOLE-TRIMETHOPRIM 800-160 MG PO TABS
1.0000 | ORAL_TABLET | Freq: Once | ORAL | Status: AC
  Administered 2019-09-19: 1 via ORAL
  Filled 2019-09-19: qty 1

## 2019-09-19 MED ORDER — BUPIVACAINE HCL (PF) 0.5 % IJ SOLN
10.0000 mL | Freq: Once | INTRAMUSCULAR | Status: AC
  Administered 2019-09-19: 10 mL
  Filled 2019-09-19: qty 10

## 2019-09-19 MED ORDER — MUPIROCIN CALCIUM 2 % EX CREA
TOPICAL_CREAM | Freq: Once | CUTANEOUS | Status: DC
  Filled 2019-09-19: qty 15

## 2019-09-19 MED ORDER — HYDROCODONE-ACETAMINOPHEN 5-325 MG PO TABS: 1.0000 | ORAL_TABLET | Freq: Three times a day (TID) | ORAL | 0 refills | Status: AC | PRN

## 2019-09-19 MED ORDER — HYDROCODONE-ACETAMINOPHEN 5-325 MG PO TABS
1.0000 | ORAL_TABLET | Freq: Once | ORAL | Status: AC
  Administered 2019-09-19: 1 via ORAL
  Filled 2019-09-19: qty 1

## 2019-09-19 MED ORDER — CEPHALEXIN 500 MG PO CAPS: 500.0000 mg | ORAL_CAPSULE | Freq: Three times a day (TID) | ORAL | 0 refills | Status: AC

## 2019-09-19 MED ORDER — CEPHALEXIN 500 MG PO CAPS
500.0000 mg | ORAL_CAPSULE | Freq: Once | ORAL | Status: AC
  Administered 2019-09-19: 500 mg via ORAL
  Filled 2019-09-19: qty 1

## 2019-09-19 MED ORDER — SULFAMETHOXAZOLE-TRIMETHOPRIM 800-160 MG PO TABS: 1.0000 | ORAL_TABLET | Freq: Two times a day (BID) | ORAL | 0 refills | Status: AC

## 2019-09-19 NOTE — ED Triage Notes (Signed)
C/O right thumb and index finger pain and swelling x 3 days.  Slight swelling appreciated.  Skin warm and dry. NAD

## 2019-09-19 NOTE — ED Notes (Signed)
See triage note  Presents with pain and swelling to right thumb and index finger   Noticed sxs; 3 days ago  Afebrile on arrival

## 2019-09-19 NOTE — ED Provider Notes (Signed)
[facility] Emergency Department Provider Note ____________________________________________  Time seen: 1730  I have reviewed the triage vital signs and the nursing notes.  HISTORY  Chief Complaint  Hand Pain  HPI Shelby Hatfield is a 43 y.o. female presents to the ED for evaluation of increasing pain to the left thumb.  Patient describes her last 4 days she has had increasing pain to the fat pad of the thumb.  She initially thought she may been bitten by spider, as she reports some pus collection to the fingertip.  She had recently placed an artificial acrylic nail tip over the native nail, and was attempting to remove the artificial nail, without success, secondary to pain.  She denies any trauma to the finger prior to onset of symptoms.  She denies any fevers, chills, or sweats.   Past Medical History:  Diagnosis Date  . Adenotonsillar hypertrophy 03/2012   snores during sleep; denies apnea; states occ. wakes up coughing  . Anemia   . Anxiety   . Bipolar 1 disorder (HCC)   . 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 Depression   . Diabetes mellitus without complication (HCC)   . Drug-seeking behavior   . GERD (gastroesophageal reflux disease)   . GI bleed   . Heart failure (HCC)   . Hypercholesterolemia   . Migraines   . Obesity   . Pneumonia   . Wears dentures    upper denture    Patient Active Problem List   Diagnosis Date Noted  . Menorrhagia with regular cycle 06/07/2017  . Cameron ulcer   . Rectal bleeding 06/30/2016  . Chronic diarrhea 06/30/2016  . Gastroesophageal reflux disease with esophagitis   . Hiatal hernia   . Morbid obesity due to excess calories (HCC)   . Protein-calorie malnutrition, severe 06/02/2016  . PUD (peptic ulcer disease) 06/01/2016  . Abdominal pain, chronic, epigastric 02/12/2016  . 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 Pulmonary nodules 02/12/2016   . Acute on chronic diastolic heart failure (HCC) 01/21/2015  . 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 Lower leg edema 01/19/2015  . Diabetes mellitus type 2 in obese (HCC)   . COPD (chronic obstructive pulmonary disease) (HCC) 08/18/2014  . Chest tightness 08/18/2014  . Opiate addiction (HCC) 05/22/2014  . 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 Opiate dependence, continuous (HCC) 04/20/2013  . Benzodiazepine dependence (HCC) 04/20/2013    Past Surgical History:  Procedure Laterality Date  . APPENDECTOMY    . BIOPSY  02/14/2016   Procedure: BIOPSY;  Surgeon: West Bali, MD;  [facility];  Service: Endoscopy;;  gastric biopsies  . cardiac thoracic surgery    . CESAREAN SECTION  08/31/2001   total of  3  . CHOLECYSTECTOMY    . DILATION AND EVACUATION  04/09/2000  . ESOPHAGEAL DILATION N/A 09/03/2016   Procedure: ESOPHAGEAL DILATION;  Surgeon: Corbin Ade, MD;  [facility];  Service: Endoscopy;  Laterality: N/A;  . ESOPHAGOGASTRODUODENOSCOPY (EGD) WITH PROPOFOL N/A 02/14/2016   Dr. Darrick Penna: Large hiatal hernia, nonbleeding cratered gastric ulcer. Multiple nonbleeding erosions in the gastric fundus. Diffuse moderate inflammation, erosions, erythema of the gastric antrum. All biopsies benign with no evidence of H. pylori.  . ESOPHAGOGASTRODUODENOSCOPY (EGD) WITH PROPOFOL N/A 06/02/2016   Procedure: ESOPHAGOGASTRODUODENOSCOPY (EGD) WITH PROPOFOL;  Surgeon: Dorena Cookey, MD;  Location: [facility] ENDOSCOPY;  Service: Endoscopy;  Laterality: N/A;  . ESOPHAGOGASTRODUODENOSCOPY (EGD) WITH PROPOFOL N/A 09/03/2016   Procedure: ESOPHAGOGASTRODUODENOSCOPY (EGD) WITH PROPOFOL;  Surgeon: Corbin Ade, MD;  [facility];  Service: Endoscopy;  Laterality: N/A;  . HIATAL HERNIA REPAIR  10/2015   planning for repeat surgery in 1/18  . open paraesophageal hernia repair with gastroexy  09/2015   Dr. Francee Gentile  . TONSILLECTOMY      adenoidectomy  . TONSILLECTOMY AND ADENOIDECTOMY  04/12/2012   Procedure: TONSILLECTOMY AND ADENOIDECTOMY;  Surgeon: Darletta Moll, MD;  Location: [facility];  Service: ENT;  Laterality: Bilateral;  . TUBAL LIGATION  08/31/2001    Prior to Admission medications   Medication Sig Start Date End Date Taking? Authorizing Provider  Aspirin-Salicylamide-Caffeine (BC HEADACHE PO) Take 1 packet by mouth daily as needed (headache).    [provider]  cephALEXin (KEFLEX) 500 MG capsule Take 1 capsule (500 mg total) by mouth 3 (three) times daily for 7 days. 09/20/19 09/27/19  Katilynn Sinkler, Charlesetta Ivory, PA-C  famotidine (PEPCID) 20 MG tablet Take 1 tablet (20 mg total) by mouth 2 (two) times daily. 09/07/18   Liberty Handy, PA-C  HYDROcodone-acetaminophen (NORCO) 5-325 MG tablet Take 1 tablet by mouth 3 (three) times daily as needed for up to 2 days. 09/19/19 09/21/19  Webb Weed, Charlesetta Ivory, PA-C  ibuprofen (ADVIL,MOTRIN) 800 MG tablet Take 1 tablet (800 mg total) by mouth every 8 (eight) hours as needed. 10/22/18   Lawyer, Cristal Deer, PA-C  sucralfate (CARAFATE) 1 g tablet Take 1 tablet (1 g total) by mouth 4 (four) times daily -  with meals and at bedtime. 09/07/18 10/07/18  Liberty Handy, PA-C  sucralfate (CARAFATE) 1 GM/10ML suspension Take 10 mLs (1 g total) by mouth 4 (four) times daily -  with meals and at bedtime. 09/07/18   Liberty Handy, PA-C  sulfamethoxazole-trimethoprim (BACTRIM DS) 800-160 MG tablet Take 1 tablet by mouth 2 (two) times daily for 10 days. 09/20/19 09/30/19  Shan Valdes, Charlesetta Ivory, PA-C    Allergies Lidocaine viscous hcl and Penicillins  Family History  Problem Relation Age of Onset  . Cancer Other   . Hypertension Mother   . Heart disease Mother   . Cancer Mother        ovarian  . Cancer Maternal Aunt        not sure primary, in kidney and colon  . Cancer Maternal Uncle        lung cancer   . Stroke Maternal Grandfather      Social History Social History   Tobacco Use  . Smoking status: Current Some Day Smoker    Packs/day: 0.33    Years: 18.00    Pack years: 5.94    Types: Cigarettes  . Smokeless tobacco: Never Used  Substance Use Topics  . Alcohol use: No    Alcohol/week: 0.0 standard drinks  . Drug use: Yes    Types: Marijuana, Cocaine    Review of Systems  Constitutional: Negative for fever. Cardiovascular: Negative for chest pain. Respiratory: Negative for shortness of breath. Musculoskeletal: Negative for back pain. Skin: Negative for rash.  Left thumb infection as above. Neurological: Negative for headaches, focal weakness or numbness. ____________________________________________  PHYSICAL EXAM:  VITAL SIGNS: ED Triage Vitals  Enc Vitals Group     BP 09/19/19 1627 (!) 132/97     Pulse Rate 09/19/19 1627 86     Resp 09/19/19 1627 19     Temp 09/19/19  1627 98.6 F (37 C)     Temp Source 09/19/19 1627 Oral     SpO2 09/19/19 1627 98 %     Weight 09/19/19 1624 132 lb 0.9 oz (59.9 kg)     Height 09/19/19 1628 5\' 1"  (1.549 m)     Head Circumference --      Peak Flow --      Pain Score 09/19/19 1623 9     Pain Loc --      Pain Edu? --      Excl. in GC? --     Constitutional: Alert and oriented. Well appearing and in no distress. Head: Normocephalic and atraumatic. Eyes: Conjunctivae are normal. Normal extraocular movements Cardiovascular: Normal rate, regular rhythm. Normal distal pulses. Respiratory: Normal respiratory effort.  Musculoskeletal: Nontender with normal range of motion in all extremities.  Neurologic:  Normal gait without ataxia. Normal speech and language. No gross focal neurologic deficits are appreciated. Skin:  Skin is warm, dry and intact. No rash noted.  Left thumb with focal pus collection to the distal tip.  Patient also with noted fullness and tension as well as pointing to the central fat pad.  Patient clinical picture concerning for failure line at  this time. ____________________________________________   RADIOLOGY  DG Left Thumb IMPRESSION: 1. No acute osseous pathology. 2. Soft tissue swelling of the thumb. ____________________________________________  PROCEDURES  Norco 5-325 mg PO Keflex 500 mg PO Bactrim DS i PO .Marland Kitchen.Incision and Drainage  Date/Time: 09/19/2019 7:10 PM Performed by: Lissa HoardMenshew, Anjana Cheek V Bacon, PA-C Authorized by: Lissa HoardMenshew, Laurie Lovejoy V Bacon, PA-C   Consent:    Consent obtained:  Verbal   Consent given by:  Patient   Risks discussed:  Bleeding, pain and incomplete drainage   Alternatives discussed:  Referral Location:    Type:  Abscess (pulp space infection)   Location:  Upper extremity   Upper extremity location:  Finger   Finger location:  L thumb Pre-procedure details:    Skin preparation:  Betadine Anesthesia (see MAR for exact dosages):    Anesthesia method:  Nerve block   Block needle gauge:  27 G   Block anesthetic:  Lidocaine 1% w/o epi and bupivacaine 0.5% w/o epi   Block technique:  Transthecal blocl   Block injection procedure:  Anatomic landmarks identified, anatomic landmarks palpated, introduced needle and incremental injection   Block outcome:  Anesthesia achieved Procedure type:    Complexity:  Complex Procedure details:    Needle aspiration: no     Incision types:  Single straight   Incision depth:  Subcutaneous   Scalpel blade:  11   Wound management:  Probed and deloculated and irrigated with saline   Drainage:  Purulent and bloody   Drainage amount:  Moderate   Wound treatment:  Wound left open   Packing materials:  None Post-procedure details:    Patient tolerance of procedure:  Tolerated well, no immediate complications  ____________________________________________  INITIAL IMPRESSION / ASSESSMENT AND PLAN / ED COURSE  Patient with ED evaluation of a fan onto the left thumb.  Patient is treated empirically with local I&D procedure to the pulp of the finger.  She is  started on Keflex and Bactrim empirically and given pain medicines for intermittent pain relief.  She is given instruction to keep the wound clean, dry, and covered, and return to the ED in 2 to 3 days for wound check as necessary.  Return precautions have been reviewed and verbalizes understood by the patient.  Shelby Hatfield was evaluated in Emergency Department on 09/20/2019 for the symptoms described in the history of present illness. She was evaluated in the context of the global COVID-19 pandemic, which necessitated consideration that the patient might be at risk for infection with the SARS-CoV-2 virus that causes COVID-19. Institutional protocols and algorithms that pertain to the evaluation of patients at risk for COVID-19 are in a state of rapid change based on information released by regulatory bodies including the CDC and federal and state organizations. These policies and algorithms were followed during the patient's care in the ED.  I reviewed the patient's prescription history over the last 12 months in the multi-state controlled substances database(s) that includes [place], [place], [place], [place], [place], [place], [place], [place], [place], [place], [place], [place], [place], and [place].  Results were notable for no current RX.  ____________________________________________  FINAL CLINICAL IMPRESSION(S) / ED DIAGNOSES  Final diagnoses:  Felon of finger      Ramiro Pangilinan, Dannielle Karvonen, PA-C 09/20/19 2325    Blake Divine, MD 10/03/19 0003

## 2019-09-19 NOTE — Discharge Instructions (Addendum)
You have had the collection of pus in the fingertip opened & drained. You should keep the open wounds clean, dry, and covered. You should cleanse with warm water and soap. You may soak in warm-salty water for a few minutes. Follow-up with Ortho or return to the ED for wound check. Take the antibiotics as directed, and the pain medicine as needed.

## 2019-12-23 ENCOUNTER — Other Ambulatory Visit: Payer: Self-pay

## 2019-12-23 ENCOUNTER — Encounter (HOSPITAL_COMMUNITY): Payer: Self-pay | Admitting: Emergency Medicine

## 2019-12-23 ENCOUNTER — Emergency Department (HOSPITAL_COMMUNITY)
Admission: EM | Admit: 2019-12-23 | Discharge: 2019-12-23 | Disposition: A | Discharge status: Home / Self Care | Payer: Self-pay | Attending: Emergency Medicine | Admitting: Emergency Medicine

## 2019-12-23 DIAGNOSIS — L0291 Cutaneous abscess, unspecified: Secondary | ICD-10-CM

## 2019-12-23 DIAGNOSIS — J449 Chronic obstructive pulmonary disease, unspecified: Secondary | ICD-10-CM | POA: Insufficient documentation

## 2019-12-23 DIAGNOSIS — F1210 Cannabis abuse, uncomplicated: Secondary | ICD-10-CM | POA: Insufficient documentation

## 2019-12-23 DIAGNOSIS — F17210 Nicotine dependence, cigarettes, uncomplicated: Secondary | ICD-10-CM | POA: Insufficient documentation

## 2019-12-23 DIAGNOSIS — N751 Abscess of Bartholin's gland: Secondary | ICD-10-CM | POA: Insufficient documentation

## 2019-12-23 DIAGNOSIS — E119 Type 2 diabetes mellitus without complications: Secondary | ICD-10-CM | POA: Insufficient documentation

## 2019-12-23 DIAGNOSIS — F1410 Cocaine abuse, uncomplicated: Secondary | ICD-10-CM | POA: Insufficient documentation

## 2019-12-23 MED ORDER — HYDROMORPHONE HCL 1 MG/ML IJ SOLN
1.0000 mg | Freq: Once | INTRAMUSCULAR | Status: AC
  Administered 2019-12-23: 1 mg via INTRAVENOUS
  Filled 2019-12-23: qty 1

## 2019-12-23 MED ORDER — LIDOCAINE-EPINEPHRINE (PF) 2 %-1:200000 IJ SOLN
10.0000 mL | Freq: Once | INTRAMUSCULAR | Status: AC
  Administered 2019-12-23: 10 mL
  Filled 2019-12-23: qty 10

## 2019-12-23 MED ORDER — HYDROCODONE-ACETAMINOPHEN 5-325 MG PO TABS: 1.0000 | ORAL_TABLET | Freq: Four times a day (QID) | ORAL | 0 refills | Status: DC | PRN

## 2019-12-23 MED ORDER — HYDROCODONE-ACETAMINOPHEN 5-325 MG PO TABS
2.0000 | ORAL_TABLET | Freq: Once | ORAL | Status: AC
  Administered 2019-12-23: 2 via ORAL
  Filled 2019-12-23: qty 2

## 2019-12-23 MED ORDER — SULFAMETHOXAZOLE-TRIMETHOPRIM 800-160 MG PO TABS: 1.0000 | ORAL_TABLET | Freq: Two times a day (BID) | ORAL | 0 refills | Status: AC

## 2019-12-23 MED ORDER — HYDROMORPHONE HCL 1 MG/ML IJ SOLN: 1.0000 mg | Freq: Once | INTRAMUSCULAR | Status: DC

## 2019-12-23 MED ORDER — CLINDAMYCIN HCL 300 MG PO CAPS
300.0000 mg | ORAL_CAPSULE | Freq: Once | ORAL | Status: AC
  Administered 2019-12-23: 04:00:00 300 mg via ORAL
  Filled 2019-12-23: qty 1

## 2019-12-23 MED ORDER — HYDROMORPHONE HCL 1 MG/ML IJ SOLN
1.0000 mg | Freq: Once | INTRAMUSCULAR | Status: AC
  Administered 2019-12-23: 04:00:00 1 mg via INTRAVENOUS
  Filled 2019-12-23: qty 1

## 2019-12-23 NOTE — ED Triage Notes (Signed)
Patient is complaining of an abscess that is in the crease of leg going to the lip of her vagina.

## 2019-12-23 NOTE — ED Provider Notes (Signed)
[facility]-EMERGENCY DEPT Provider Note   CSN: [id number] Arrival date & time: 12/23/19  0222     History Chief Complaint  Patient presents with  . Abscess    Shelby Hatfield is a 44 y.o. female.  The history is provided by the patient.  Abscess Location:  Pelvis Pelvic abscess location:  Vulva Abscess quality: induration, painful and redness   Duration:  1 week Progression:  Worsening Pain details:    Severity:  Severe   Timing:  Constant   Progression:  Worsening Chronicity:  New Context: not diabetes   Relieved by:  Nothing Exacerbated by: Movement and certain positions. Associated symptoms: no fever and no vomiting    Patient presents with abscess to her vaginal area.  This became worse over the past week.  No fevers or vomiting.    Past Medical History:  Diagnosis Date  . Adenotonsillar hypertrophy 03/2012   snores during sleep; denies apnea; states occ. wakes up coughing  . Anemia   . Anxiety   . Bipolar 1 disorder (HCC)   . 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 Depression   . Diabetes mellitus without complication (HCC)   . Drug-seeking behavior   . GERD (gastroesophageal reflux disease)   . GI bleed   . Heart failure (HCC)   . Hypercholesterolemia   . Migraines   . Obesity   . Pneumonia   . Wears dentures    upper denture    Patient Active Problem List   Diagnosis Date Noted  . Menorrhagia with regular cycle 06/07/2017  . Cameron ulcer   . Rectal bleeding 06/30/2016  . Chronic diarrhea 06/30/2016  . Gastroesophageal reflux disease with esophagitis   . Hiatal hernia   . Morbid obesity due to excess calories (HCC)   . Protein-calorie malnutrition, severe 06/02/2016  . PUD (peptic ulcer disease) 06/01/2016  . Abdominal pain, chronic, epigastric 02/12/2016  . 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 Pulmonary nodules 02/12/2016  . Acute on chronic diastolic  heart failure (HCC) 56/38/7564  . 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 Lower leg edema 01/19/2015  . Diabetes mellitus type 2 in obese (HCC)   . COPD (chronic obstructive pulmonary disease) (HCC) 08/18/2014  . Chest tightness 08/18/2014  . Opiate addiction (HCC) 05/22/2014  . 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 Opiate dependence, continuous (HCC) 04/20/2013  . Benzodiazepine dependence (HCC) 04/20/2013    Past Surgical History:  Procedure Laterality Date  . APPENDECTOMY    . BIOPSY  02/14/2016   Procedure: BIOPSY;  Surgeon: West Bali, MD;  Location: [facility];  Service: Endoscopy;;  gastric biopsies  . cardiac thoracic surgery    . CESAREAN SECTION  08/31/2001   total of  3  . CHOLECYSTECTOMY    . DILATION AND EVACUATION  04/09/2000  . ESOPHAGEAL DILATION N/A 09/03/2016   Procedure: ESOPHAGEAL DILATION;  Surgeon: Corbin Ade, MD;  Location: [facility];  Service: Endoscopy;  Laterality: N/A;  . ESOPHAGOGASTRODUODENOSCOPY (EGD) WITH PROPOFOL N/A 02/14/2016   Dr. Darrick Penna: Large hiatal hernia, nonbleeding cratered gastric ulcer. Multiple nonbleeding erosions in the gastric fundus. Diffuse moderate inflammation, erosions, erythema of the gastric antrum. All biopsies benign with no evidence of H. pylori.  . ESOPHAGOGASTRODUODENOSCOPY (EGD) WITH PROPOFOL N/A 06/02/2016   Procedure: ESOPHAGOGASTRODUODENOSCOPY (EGD) WITH PROPOFOL;  Surgeon: Dorena Cookey, MD;  Location: [facility] ENDOSCOPY;  Service: Endoscopy;  Laterality: N/A;  . ESOPHAGOGASTRODUODENOSCOPY (EGD) WITH PROPOFOL N/A 09/03/2016   Procedure: ESOPHAGOGASTRODUODENOSCOPY (EGD) WITH PROPOFOL;  Surgeon: Corbin Ade, MD;  Location: [facility];  Service: Endoscopy;  Laterality: N/A;  . HIATAL HERNIA REPAIR  10/2015   planning for repeat surgery in 1/18  . open paraesophageal hernia repair with gastroexy  09/2015   Dr. Francee Gentile  . TONSILLECTOMY     adenoidectomy  .  TONSILLECTOMY AND ADENOIDECTOMY  04/12/2012   Procedure: TONSILLECTOMY AND ADENOIDECTOMY;  Surgeon: Darletta Moll, MD;  Location: [facility];  Service: ENT;  Laterality: Bilateral;  . TUBAL LIGATION  08/31/2001     OB History    Gravida  5   Para  3   Term  2   Preterm  1   AB  2   Living  3     SAB  2   TAB      Ectopic      Multiple      Live Births  3           Family History  Problem Relation Age of Onset  . Cancer Other   . Hypertension Mother   . Heart disease Mother   . Cancer Mother        ovarian  . Cancer Maternal Aunt        not sure primary, in kidney and colon  . Cancer Maternal Uncle        lung cancer   . Stroke Maternal Grandfather     Social History   Tobacco Use  . Smoking status: Current Some Day Smoker    Packs/day: 0.33    Years: 18.00    Pack years: 5.94    Types: Cigarettes  . Smokeless tobacco: Never Used  Substance Use Topics  . Alcohol use: No    Alcohol/week: 0.0 standard drinks  . Drug use: Yes    Types: Marijuana, Cocaine    Home Medications Prior to Admission medications   Medication Sig Start Date End Date Taking? Authorizing Provider  Aspirin-Salicylamide-Caffeine (BC HEADACHE PO) Take 1 packet by mouth daily as needed (headache).    [provider]  famotidine (PEPCID) 20 MG tablet Take 1 tablet (20 mg total) by mouth 2 (two) times daily. 09/07/18   Liberty Handy, PA-C  ibuprofen (ADVIL,MOTRIN) 800 MG tablet Take 1 tablet (800 mg total) by mouth every 8 (eight) hours as needed. 10/22/18   Lawyer, Cristal Deer, PA-C  sucralfate (CARAFATE) 1 g tablet Take 1 tablet (1 g total) by mouth 4 (four) times daily -  with meals and at bedtime. 09/07/18 10/07/18  Liberty Handy, PA-C  sucralfate (CARAFATE) 1 GM/10ML suspension Take 10 mLs (1 g total) by mouth 4 (four) times daily -  with meals and at bedtime. 09/07/18   Liberty Handy, PA-C    Allergies    Lidocaine viscous hcl and  Penicillins  Review of Systems   Review of Systems  Constitutional: Negative for fever.  Gastrointestinal: Negative for abdominal pain and vomiting.  Skin:       Abscess  Psychiatric/Behavioral: The patient is nervous/anxious.   All other systems reviewed and are negative.   Physical Exam Updated Vital Signs BP 126/89 (BP Location: Left Arm)   Pulse (!) 111   Temp 98.1 F (36.7 C) (Oral)   Resp 19   Ht 1.549 m (5\' 1" )   Wt 64.4 kg   LMP 12/09/2019  SpO2 97%   BMI 26.83 kg/m   Physical Exam CONSTITUTIONAL: Well developed/well nourished, anxious HEAD: Normocephalic/atraumatic EYES: EOMI/PERRL ENMT: Mucous membranes moist NECK: supple no meningeal signs SPINE/BACK:entire spine nontender CV: S1/S2 noted, no murmurs/rubs/gallops noted LUNGS: Lungs are clear to auscultation bilaterally, no apparent distress ABDOMEN: soft, nontender, no rebound or guarding, bowel sounds noted throughout abdomen GU:no cva tenderness, patient with Bartholin cyst/abscess on the left.  Overlying erythema and tenderness.  No crepitus.  It does not extend into the perineum.  Female chaperone present for exam NEURO: Pt is awake/alert/appropriate, moves all extremitiesx4.  No facial droop.   EXTREMITIES: pulses normal/equal, full ROM SKIN: warm, color normal PSYCH: Anxious  ED Results / Procedures / Treatments   Labs (all labs ordered are listed, but only abnormal results are displayed) Labs Reviewed - No data to display  EKG None  Radiology No results found.  Procedures .Marland KitchenIncision and Drainage  Date/Time: 12/23/2019 5:41 AM Performed by: Zadie Rhine, MD Authorized by: Zadie Rhine, MD   Consent:    Consent obtained:  Verbal   Consent given by:  Patient   Risks discussed:  Pain and bleeding Location:    Type:  Bartholin cyst   Location:  Anogenital   Anogenital location:  Bartholin's gland Pre-procedure details:    Skin preparation:  Betadine Anesthesia (see MAR for  exact dosages):    Anesthesia method:  Local infiltration   Local anesthetic:  Lidocaine 2% WITH epi Procedure type:    Complexity:  Simple Procedure details:    Needle aspiration: no     Incision types:  Stab incision   Scalpel blade:  11   Drainage:  Bloody   Drainage amount:  Scant Post-procedure details:    Patient tolerance of procedure:  Procedure terminated at patient's request Comments:     Patient was given up to 2 mg of Dilaudid for pain relief.  On making a decision patient began to scream and instructed me to stop the procedure completely.  Small amount of bleeding was noted, but no pus was drained.  Fluctuance and induration remains Entire procedure was chaperoned by female assistant    Medications Ordered in ED Medications  HYDROcodone-acetaminophen (NORCO/VICODIN) 5-325 MG per tablet 2 tablet (has no administration in time range)  HYDROmorphone (DILAUDID) injection 1 mg (1 mg Intravenous Given 12/23/19 0417)  clindamycin (CLEOCIN) capsule 300 mg (300 mg Oral Given 12/23/19 0417)  lidocaine-EPINEPHrine (XYLOCAINE W/EPI) 2 %-1:200000 (PF) injection 10 mL (10 mLs Infiltration Given by Other 12/23/19 0509)  HYDROmorphone (DILAUDID) injection 1 mg (1 mg Intravenous Given 12/23/19 0510)    ED Course  I have reviewed the triage vital signs and the nursing notes.   MDM Rules/Calculators/A&P                      Patient presented with probable Bartholin cyst/abscess. Patient was given 2 mg of Dilaudid for the procedure.  During the procedure she began to scream and told me to stop.  Small amount of blood came out, but no purulence. Patient prefers to be discharged.  Will place her on antibiotics and pain medicines.  She can follow-up with her gynecologist or woman's clinic in 2 days.  Patient not septic appearing, and requires no further imaging.  I did advise her that she will likely still need incision and drainage in the near future Final Clinical Impression(s) / ED  Diagnoses Final diagnoses:  Abscess    Rx / DC Orders ED Discharge Orders  Ordered    HYDROcodone-acetaminophen (NORCO/VICODIN) 5-325 MG tablet  Every 6 hours PRN     12/23/19 0535    sulfamethoxazole-trimethoprim (BACTRIM DS) 800-160 MG tablet  2 times daily     12/23/19 0535           Ripley Fraise, MD 12/23/19 [phone]

## 2021-09-09 ENCOUNTER — Inpatient Hospital Stay (HOSPITAL_COMMUNITY)
Admission: EM | Admit: 2021-09-09 | Discharge: 2021-09-12 | DRG: 368 | Disposition: A | Discharge status: Home / Self Care | Payer: Self-pay | Attending: Internal Medicine | Admitting: Internal Medicine

## 2021-09-09 ENCOUNTER — Encounter (HOSPITAL_COMMUNITY): Payer: Self-pay

## 2021-09-09 ENCOUNTER — Other Ambulatory Visit: Payer: Self-pay

## 2021-09-09 DIAGNOSIS — K2101 Gastro-esophageal reflux disease with esophagitis, with bleeding: Principal | ICD-10-CM | POA: Diagnosis present

## 2021-09-09 DIAGNOSIS — N3000 Acute cystitis without hematuria: Secondary | ICD-10-CM

## 2021-09-09 DIAGNOSIS — Z8711 Personal history of peptic ulcer disease: Secondary | ICD-10-CM

## 2021-09-09 DIAGNOSIS — K7460 Unspecified cirrhosis of liver: Secondary | ICD-10-CM | POA: Diagnosis present

## 2021-09-09 DIAGNOSIS — E1143 Type 2 diabetes mellitus with diabetic autonomic (poly)neuropathy: Secondary | ICD-10-CM | POA: Diagnosis present

## 2021-09-09 DIAGNOSIS — F17210 Nicotine dependence, cigarettes, uncomplicated: Secondary | ICD-10-CM | POA: Diagnosis present

## 2021-09-09 DIAGNOSIS — F419 Anxiety disorder, unspecified: Secondary | ICD-10-CM | POA: Diagnosis present

## 2021-09-09 DIAGNOSIS — R932 Abnormal findings on diagnostic imaging of liver and biliary tract: Secondary | ICD-10-CM

## 2021-09-09 DIAGNOSIS — Z8701 Personal history of pneumonia (recurrent): Secondary | ICD-10-CM

## 2021-09-09 DIAGNOSIS — Z765 Malingerer [conscious simulation]: Secondary | ICD-10-CM

## 2021-09-09 DIAGNOSIS — R1013 Epigastric pain: Secondary | ICD-10-CM

## 2021-09-09 DIAGNOSIS — E876 Hypokalemia: Secondary | ICD-10-CM | POA: Diagnosis present

## 2021-09-09 DIAGNOSIS — K2100 Gastro-esophageal reflux disease with esophagitis, without bleeding: Secondary | ICD-10-CM | POA: Diagnosis present

## 2021-09-09 DIAGNOSIS — R112 Nausea with vomiting, unspecified: Secondary | ICD-10-CM

## 2021-09-09 DIAGNOSIS — K7400 Hepatic fibrosis, unspecified: Secondary | ICD-10-CM | POA: Diagnosis present

## 2021-09-09 DIAGNOSIS — K922 Gastrointestinal hemorrhage, unspecified: Secondary | ICD-10-CM

## 2021-09-09 DIAGNOSIS — G43909 Migraine, unspecified, not intractable, without status migrainosus: Secondary | ICD-10-CM | POA: Diagnosis present

## 2021-09-09 DIAGNOSIS — K3184 Gastroparesis: Secondary | ICD-10-CM | POA: Diagnosis present

## 2021-09-09 DIAGNOSIS — Z884 Allergy status to anesthetic agent status: Secondary | ICD-10-CM

## 2021-09-09 DIAGNOSIS — B1920 Unspecified viral hepatitis C without hepatic coma: Secondary | ICD-10-CM | POA: Diagnosis present

## 2021-09-09 DIAGNOSIS — J449 Chronic obstructive pulmonary disease, unspecified: Secondary | ICD-10-CM | POA: Diagnosis present

## 2021-09-09 DIAGNOSIS — E869 Volume depletion, unspecified: Secondary | ICD-10-CM | POA: Diagnosis present

## 2021-09-09 DIAGNOSIS — K449 Diaphragmatic hernia without obstruction or gangrene: Secondary | ICD-10-CM | POA: Diagnosis present

## 2021-09-09 DIAGNOSIS — U071 COVID-19: Secondary | ICD-10-CM | POA: Diagnosis present

## 2021-09-09 DIAGNOSIS — Z8249 Family history of ischemic heart disease and other diseases of the circulatory system: Secondary | ICD-10-CM

## 2021-09-09 DIAGNOSIS — Z9049 Acquired absence of other specified parts of digestive tract: Secondary | ICD-10-CM

## 2021-09-09 DIAGNOSIS — Z880 Allergy status to penicillin: Secondary | ICD-10-CM

## 2021-09-09 DIAGNOSIS — K209 Esophagitis, unspecified without bleeding: Secondary | ICD-10-CM

## 2021-09-09 DIAGNOSIS — I509 Heart failure, unspecified: Secondary | ICD-10-CM | POA: Diagnosis present

## 2021-09-09 DIAGNOSIS — E7800 Pure hypercholesterolemia, unspecified: Secondary | ICD-10-CM | POA: Diagnosis present

## 2021-09-09 DIAGNOSIS — Z79899 Other long term (current) drug therapy: Secondary | ICD-10-CM

## 2021-09-09 DIAGNOSIS — K429 Umbilical hernia without obstruction or gangrene: Secondary | ICD-10-CM | POA: Diagnosis present

## 2021-09-09 DIAGNOSIS — F319 Bipolar disorder, unspecified: Secondary | ICD-10-CM | POA: Diagnosis present

## 2021-09-09 DIAGNOSIS — K921 Melena: Secondary | ICD-10-CM

## 2021-09-09 LAB — CBC WITH DIFFERENTIAL/PLATELET
Abs Immature Granulocytes: 0 10*3/uL (ref 0.00–0.07)
Basophils Absolute: 0 10*3/uL (ref 0.0–0.1)
Basophils Relative: 0 %
Eosinophils Absolute: 0 10*3/uL (ref 0.0–0.5)
Eosinophils Relative: 0 %
HCT: 37.9 % (ref 36.0–46.0)
Hemoglobin: 12.3 g/dL (ref 12.0–15.0)
Lymphocytes Relative: 18 %
Lymphs Abs: 1.9 10*3/uL (ref 0.7–4.0)
MCH: 27 pg (ref 26.0–34.0)
MCHC: 32.5 g/dL (ref 30.0–36.0)
MCV: 83.1 fL (ref 80.0–100.0)
Monocytes Absolute: 0.2 10*3/uL (ref 0.1–1.0)
Monocytes Relative: 2 %
Neutro Abs: 8.6 10*3/uL — ABNORMAL HIGH (ref 1.7–7.7)
Neutrophils Relative %: 80 %
Platelets: 216 10*3/uL (ref 150–400)
RBC: 4.56 MIL/uL (ref 3.87–5.11)
RDW: 13.4 % (ref 11.5–15.5)
WBC: 10.8 10*3/uL — ABNORMAL HIGH (ref 4.0–10.5)
nRBC: 0 % (ref 0.0–0.2)
nRBC: 0 /100 WBC

## 2021-09-09 LAB — COMPREHENSIVE METABOLIC PANEL
ALT: 48 U/L — ABNORMAL HIGH (ref 0–44)
AST: 34 U/L (ref 15–41)
Albumin: 3.3 g/dL — ABNORMAL LOW (ref 3.5–5.0)
Alkaline Phosphatase: 161 U/L — ABNORMAL HIGH (ref 38–126)
Anion gap: 9 (ref 5–15)
BUN: 12 mg/dL (ref 6–20)
CO2: 26 mmol/L (ref 22–32)
Calcium: 9.4 mg/dL (ref 8.9–10.3)
Chloride: 101 mmol/L (ref 98–111)
Creatinine, Ser: 0.67 mg/dL (ref 0.44–1.00)
GFR, Estimated: >60 mL/min (ref >60)
Glucose, Bld: 99 mg/dL (ref 70–99)
Potassium: 3.2 mmol/L — ABNORMAL LOW (ref 3.5–5.1)
Sodium: 136 mmol/L (ref 135–145)
Total Bilirubin: 0.8 mg/dL (ref 0.3–1.2)
Total Protein: 6.6 g/dL (ref 6.5–8.1)

## 2021-09-09 LAB — URINALYSIS, MICROSCOPIC (REFLEX): WBC, UA: >50 WBC/hpf (ref 0–5)

## 2021-09-09 LAB — URINALYSIS, ROUTINE W REFLEX MICROSCOPIC
Bilirubin Urine: NEGATIVE
Glucose, UA: NEGATIVE mg/dL
Ketones, ur: NEGATIVE mg/dL
Nitrite: POSITIVE — AB
Protein, ur: NEGATIVE mg/dL
Specific Gravity, Urine: 1.01 (ref 1.005–1.030)
pH: 6.5 (ref 5.0–8.0)

## 2021-09-09 LAB — LIPASE, BLOOD: Lipase: 30 U/L (ref 11–51)

## 2021-09-09 NOTE — ED Provider Notes (Signed)
Emergency Medicine Provider Triage Evaluation Note  Shelby Hatfield , a 45 y.o. female  was evaluated in triage.  Pt complains of epigastric abdominal pain.  History of "twisted intestines."  States this feels similar.  She also admits to melanotic stool.  Pain is located to upper abdominal area.  Goes into back.  Denies any chronic NSAID use, EtOH use.  She had multiple episodes of NBNB emesis.  States she is been running a low-grade fever at home.  No known sick contacts.  No chest pain, shortness of breath. Review of Systems  Positive: Epigastric abdominal pain, melena Negative: CP, SOB  Physical Exam  BP 126/87 (BP Location: Right Arm)    Pulse 87    Temp 99 F (37.2 C) (Oral)    Resp 18    SpO2 100%  Gen:   Awake, no distress   Resp:  Normal effort  MSK:   Moves extremities without difficulty  ABD:  Diffusely tender to upper abdomen, voluntary guarding Other:    Medical Decision Making  Medically screening exam initiated at 10:19 PM.  Appropriate orders placed.  Celena S Bergdoll was informed that the remainder of the evaluation will be completed by another provider, this initial triage assessment does not replace that evaluation, and the importance of remaining in the ED until their evaluation is complete.  Abdominal pain, melena   Jentri Aye A, PA-C 09/09/21 2225    Pollyann Savoy, MD 09/09/21 2238

## 2021-09-09 NOTE — ED Triage Notes (Signed)
Pt here for abdominal pain that's been ongoing for the past 4 days. Patient states that she's been having black tarry stools, nausea and vomiting, unable to tolerate anything PO and has been having intermittent fevers. Pt states abdominal pain is epigastric in region and states is feels like a twisting sensation.

## 2021-09-10 ENCOUNTER — Emergency Department (HOSPITAL_COMMUNITY): Payer: Self-pay

## 2021-09-10 DIAGNOSIS — R932 Abnormal findings on diagnostic imaging of liver and biliary tract: Secondary | ICD-10-CM

## 2021-09-10 DIAGNOSIS — K922 Gastrointestinal hemorrhage, unspecified: Secondary | ICD-10-CM

## 2021-09-10 DIAGNOSIS — K921 Melena: Secondary | ICD-10-CM | POA: Insufficient documentation

## 2021-09-10 DIAGNOSIS — R1013 Epigastric pain: Secondary | ICD-10-CM

## 2021-09-10 LAB — CBC
HCT: 35.8 % — ABNORMAL LOW (ref 36.0–46.0)
Hemoglobin: 12.1 g/dL (ref 12.0–15.0)
MCH: 27.1 pg (ref 26.0–34.0)
MCHC: 33.8 g/dL (ref 30.0–36.0)
MCV: 80.1 fL (ref 80.0–100.0)
Platelets: 218 10*3/uL (ref 150–400)
RBC: 4.47 MIL/uL (ref 3.87–5.11)
RDW: 13.2 % (ref 11.5–15.5)
WBC: 9.3 10*3/uL (ref 4.0–10.5)
nRBC: 0 % (ref 0.0–0.2)

## 2021-09-10 LAB — RESP PANEL BY RT-PCR (FLU A&B, COVID) ARPGX2
Influenza A by PCR: NEGATIVE
Influenza B by PCR: NEGATIVE
SARS Coronavirus 2 by RT PCR: POSITIVE — AB

## 2021-09-10 LAB — HIV ANTIBODY (ROUTINE TESTING W REFLEX): HIV Screen 4th Generation wRfx: NONREACTIVE

## 2021-09-10 LAB — PROTIME-INR
INR: 1 (ref 0.8–1.2)
Prothrombin Time: 13.2 seconds (ref 11.4–15.2)

## 2021-09-10 LAB — POC OCCULT BLOOD, ED: Fecal Occult Bld: POSITIVE — AB

## 2021-09-10 LAB — I-STAT BETA HCG BLOOD, ED (MC, WL, AP ONLY): I-stat hCG, quantitative: 10.2 m[IU]/mL — ABNORMAL HIGH (ref <5)

## 2021-09-10 LAB — PREGNANCY, URINE: Preg Test, Ur: NEGATIVE

## 2021-09-10 MED ORDER — HYDROMORPHONE HCL 1 MG/ML IJ SOLN
1.0000 mg | Freq: Once | INTRAMUSCULAR | Status: AC
  Administered 2021-09-10: 12:00:00 1 mg via INTRAVENOUS
  Filled 2021-09-10: qty 1

## 2021-09-10 MED ORDER — MORPHINE SULFATE (PF) 2 MG/ML IV SOLN
2.0000 mg | INTRAVENOUS | Status: DC | PRN
  Administered 2021-09-10 – 2021-09-11 (×5): 2 mg via INTRAVENOUS
  Filled 2021-09-10 (×5): qty 1

## 2021-09-10 MED ORDER — ONDANSETRON HCL 4 MG/2ML IJ SOLN
4.0000 mg | Freq: Once | INTRAMUSCULAR | Status: AC
  Administered 2021-09-10: 10:00:00 4 mg via INTRAVENOUS
  Filled 2021-09-10: qty 2

## 2021-09-10 MED ORDER — SODIUM CHLORIDE 0.9 % IV SOLN
1.0000 g | Freq: Once | INTRAVENOUS | Status: AC
  Administered 2021-09-10: 10:00:00 1 g via INTRAVENOUS
  Filled 2021-09-10: qty 10

## 2021-09-10 MED ORDER — SODIUM CHLORIDE 0.9 % IV BOLUS
1000.0000 mL | Freq: Once | INTRAVENOUS | Status: AC
  Administered 2021-09-10: 10:00:00 1000 mL via INTRAVENOUS

## 2021-09-10 MED ORDER — SODIUM CHLORIDE 0.9 % IV SOLN
2.0000 g | INTRAVENOUS | Status: DC
  Administered 2021-09-11 – 2021-09-12 (×2): 2 g via INTRAVENOUS
  Filled 2021-09-10 (×2): qty 20

## 2021-09-10 MED ORDER — ONDANSETRON HCL 4 MG PO TABS
4.0000 mg | ORAL_TABLET | Freq: Four times a day (QID) | ORAL | Status: DC | PRN
  Administered 2021-09-10: 16:00:00 4 mg via ORAL
  Filled 2021-09-10: qty 1

## 2021-09-10 MED ORDER — POTASSIUM CHLORIDE 10 MEQ/100ML IV SOLN
10.0000 meq | INTRAVENOUS | Status: AC
  Administered 2021-09-10 (×4): 10 meq via INTRAVENOUS
  Filled 2021-09-10 (×4): qty 100

## 2021-09-10 MED ORDER — PANTOPRAZOLE SODIUM 40 MG IV SOLR
40.0000 mg | Freq: Once | INTRAVENOUS | Status: AC
  Administered 2021-09-10: 11:00:00 40 mg via INTRAVENOUS
  Filled 2021-09-10: qty 40

## 2021-09-10 MED ORDER — HYDROMORPHONE HCL 1 MG/ML IJ SOLN
1.0000 mg | Freq: Once | INTRAMUSCULAR | Status: AC
  Administered 2021-09-10: 10:00:00 1 mg via INTRAVENOUS
  Filled 2021-09-10: qty 1

## 2021-09-10 MED ORDER — NIRMATRELVIR/RITONAVIR (PAXLOVID)TABLET
3.0000 | ORAL_TABLET | Freq: Two times a day (BID) | ORAL | Status: DC
  Administered 2021-09-10 – 2021-09-12 (×4): 3 via ORAL
  Filled 2021-09-10 (×2): qty 30

## 2021-09-10 MED ORDER — PANTOPRAZOLE SODIUM 40 MG IV SOLR
40.0000 mg | Freq: Two times a day (BID) | INTRAVENOUS | Status: DC
  Administered 2021-09-10 – 2021-09-12 (×4): 40 mg via INTRAVENOUS
  Filled 2021-09-10 (×5): qty 40

## 2021-09-10 MED ORDER — ONDANSETRON HCL 4 MG/2ML IJ SOLN: 4.0000 mg | Freq: Four times a day (QID) | INTRAMUSCULAR | Status: DC | PRN

## 2021-09-10 MED ORDER — IOHEXOL 300 MG/ML  SOLN
100.0000 mL | Freq: Once | INTRAMUSCULAR | Status: AC | PRN
  Administered 2021-09-10: 11:00:00 100 mL via INTRAVENOUS

## 2021-09-10 NOTE — ED Notes (Addendum)
Pt returned from CT °

## 2021-09-10 NOTE — H&P (View-Only) (Signed)
° ° ° ° ° Consultation ° °Referring Provider:  Dr. Adam Curatolo    °Primary Care Physician:  Patient, No Pcp Per (Inactive) °Primary Gastroenterologist:  06/2016 [facility]       °Reason for Consultation:    Melena, abnormal CT AB cirrhosis  °       ° HPI:   °Shelby Hatfield is a 45 y.o. female with history of GI bleed 2017, GERD with prior paraesophageal hernia repair laparoscopically 08/2016 at [facility] repair, hyperlipidemia, type 2 diabetes, COPD, bipolar 1 disorder presents with melena. ° °ED course: Potassium 3.2, alkaline phosphatase 161, albumin 3.3, ALT 48, AST 34, total bilirubin 0.8 °White blood cell count 9.3, hemoglobin 11.2, MCV 80, platelets 218 °Positive fecal occult blood in the ER °Urine nitrate positive, moderate leukocytes, trace blood, many bacteria °CT scan shows somewhat coarse, nodular contour of liver, suggestive of cirrhosis, new splenomegaly 14.5 cm, status postcholecystectomy.  No biliary dilatation.  Pancreas unremarkable.  Prior hiatal hernia repair. ° °Patient has history of upper GI bleed September 2017 showing large hiatal hernia reflux esophagitis and gastritis, repeat endoscopy 08/2016 showed large hiatal hernia and large gastric ulcer straddling diaphragmatic hiatus, that Hemm is having 3 days of hematemesis and dark stools. °Patient then underwent paraesophageal hernia repair 08/2016 laparoscopically at [facility] with posterior lateral thoracotomy.  At that time gastroparesis was also suspected. °Patient's states she had repeat paraesophageal hernia repair after the first 1 possibly 11/03/2016 due to failure. °H. pylori was negative at that time. ° °She was in her normal state of health from 2017 until now until about 5 days ago.  States for her daughter's birthday on 09/05/2022 had pizza and spaghetti, that morning at 4 AM she woke up vomiting. °Patient states the first 3 days had metallic taste in her mouth and was read, "look like raspberries", this  eventually became more like green bile and clear.  Patient states she had vomiting with attempt to eat or drink anything.  Was also having worsening reflux at that time.  Was still on outpatient Carafate and Pepcid. °Patient then states about 3 days ago started to have dark tarry stools.  Last bowel movement was yesterday and was dark and dark and tarry. °When she has formed stools about 3-4 times daily. °Patient yesterday morning began to have subjective fever, no chills. °Patient denies cough, shortness of breath, chest pain, dizziness, palpitations. ° °On CT possible nodularity of liver and early cirrhosis. °Patient denies any weight gain, states she has occasional abdominal swelling, no peripheral edema. °Denies any yellowing of skin or eyes, denies dark urine, denies itching. °Patient denies alcohol use other than a 6-month period in 2007, denies NSAIDs. °Does have 13 tattoos, states previous boyfriend did have hepatitis C possible exposure last tested several years ago. °Mother with history of cirrhosis and ulcers however she was an alcoholic, dad with ulcers, maternal grandfather with ulcers. °She has no siblings, 4 grandkids aged 10, 7, 6 and 2. ° °Previous GI work up: °05/2016 EGD with Dr. Hayes for GI bleed,melena, heme +, showed large hiatal hernia, LA Grade A reflux esophagitis, gastritis. Ct possible gastric and esophageal varices at that time.  °Was scheduled for colonoscopy 06/30/2016 but this was canceled.  ° °Past Medical History:  °Diagnosis Date  ° Adenotonsillar hypertrophy 03/2012  ° snores during sleep; denies apnea; states occ. wakes up coughing  ° Anemia   ° Anxiety   ° Bipolar 1 disorder (HCC)   ° COPD (chronic obstructive pulmonary   disease) (HCC)    stage 2, 2 liters of oxygen at night for ATX   Depression    Diabetes mellitus without complication (HCC)    Drug-seeking behavior    GERD (gastroesophageal reflux disease)    GI bleed    Heart failure (HCC)    Hypercholesterolemia     Migraines    Obesity    Pneumonia    Wears dentures    upper denture    Past Surgical History:  Procedure Laterality Date   APPENDECTOMY     BIOPSY  02/14/2016   Procedure: BIOPSY;  Surgeon: West Bali, MD;  Location: [facility];  Service: Endoscopy;;  gastric biopsies   cardiac thoracic surgery     CESAREAN SECTION  08/31/2001   total of  3   CHOLECYSTECTOMY     DILATION AND EVACUATION  04/09/2000   ESOPHAGEAL DILATION N/A 09/03/2016   Procedure: ESOPHAGEAL DILATION;  Surgeon: Corbin Ade, MD;  Location: [facility];  Service: Endoscopy;  Laterality: N/A;   ESOPHAGOGASTRODUODENOSCOPY (EGD) WITH PROPOFOL N/A 02/14/2016   Dr. Darrick Penna: Large hiatal hernia, nonbleeding cratered gastric ulcer. Multiple nonbleeding erosions in the gastric fundus. Diffuse moderate inflammation, erosions, erythema of the gastric antrum. All biopsies benign with no evidence of H. pylori.   ESOPHAGOGASTRODUODENOSCOPY (EGD) WITH PROPOFOL N/A 06/02/2016   Procedure: ESOPHAGOGASTRODUODENOSCOPY (EGD) WITH PROPOFOL;  Surgeon: Dorena Cookey, MD;  Location: [facility];  Service: Endoscopy;  Laterality: N/A;   ESOPHAGOGASTRODUODENOSCOPY (EGD) WITH PROPOFOL N/A 09/03/2016   Procedure: ESOPHAGOGASTRODUODENOSCOPY (EGD) WITH PROPOFOL;  Surgeon: Corbin Ade, MD;  Location: [facility];  Service: Endoscopy;  Laterality: N/A;   HIATAL HERNIA REPAIR  10/2015   planning for repeat surgery in 1/18   open paraesophageal hernia repair with gastroexy  09/2015   Dr. Francee Gentile   TONSILLECTOMY     adenoidectomy   TONSILLECTOMY AND ADENOIDECTOMY  04/12/2012   Procedure: TONSILLECTOMY AND ADENOIDECTOMY;  Surgeon: Darletta Moll, MD;  Location: [facility];  Service: ENT;  Laterality: Bilateral;   TUBAL LIGATION  08/31/2001    Family History  Problem Relation Age of Onset   Cancer Other    Hypertension Mother    Heart disease Mother    Cancer Mother        ovarian   Cancer Maternal Aunt        not sure  primary, in kidney and colon   Cancer Maternal Uncle        lung cancer    Stroke Maternal Grandfather      Social History   Tobacco Use   Smoking status: Some Days    Packs/day: 0.33    Years: 18.00    Pack years: 5.94    Types: Cigarettes   Smokeless tobacco: Never  Vaping Use   Vaping Use: Former  Substance Use Topics   Alcohol use: No    Alcohol/week: 0.0 standard drinks   Drug use: Yes    Types: Marijuana, Cocaine    Prior to Admission medications   Medication Sig Start Date End Date Taking? Authorizing Provider  Aspirin-Salicylamide-Caffeine (BC HEADACHE PO) Take 1 packet by mouth daily as needed (headache).    [provider]  famotidine (PEPCID) 20 MG tablet Take 1 tablet (20 mg total) by mouth 2 (two) times daily. 09/07/18   Liberty Handy, PA-C  HYDROcodone-acetaminophen (NORCO/VICODIN) 5-325 MG tablet Take 1 tablet by mouth every 6 (six) hours as needed for severe pain. 12/23/19  Zadie Rhine, MD  sucralfate (CARAFATE) 1 g tablet Take 1 tablet (1 g total) by mouth 4 (four) times daily -  with meals and at bedtime. 09/07/18 12/23/19  Liberty Handy, PA-C    No current facility-administered medications for this encounter.   Current Outpatient Medications  Medication Sig Dispense Refill   Aspirin-Salicylamide-Caffeine (BC HEADACHE PO) Take 1 packet by mouth daily as needed (headache).     famotidine (PEPCID) 20 MG tablet Take 1 tablet (20 mg total) by mouth 2 (two) times daily. 30 tablet 0   HYDROcodone-acetaminophen (NORCO/VICODIN) 5-325 MG tablet Take 1 tablet by mouth every 6 (six) hours as needed for severe pain. 10 tablet 0    Allergies as of 09/09/2021 - Review Complete 09/09/2021  Allergen Reaction Noted   Lidocaine viscous hcl Other (See Comments) 06/01/2016   Penicillins Itching and Rash 07/17/2011    Review of Systems:    Constitutional: fever No weight loss, chills, weakness or fatigue Skin: No rash or itching Cardiovascular: No  chest pain, chest pressure or palpitations   Respiratory: No SOB or cough Gastrointestinal: See HPI and otherwise negative Genitourinary: No dysuria or change in urinary frequency Neurological: No headache, dizziness or syncope Musculoskeletal: No new muscle or joint pain Hematologic: No bleeding or bruising Psychiatric: No history of depression or anxiety     Physical Exam:  Vital signs in last 24 hours: Temp:  [98.6 F (37 C)-99 F (37.2 C)] 98.6 F (37 C) (12/14 0614) Pulse Rate:  [55-87] 55 (12/14 1030) Resp:  [9-20] 9 (12/14 1030) BP: (126-148)/(87-99) 148/87 (12/14 1030) SpO2:  [98 %-100 %] 98 % (12/14 1030) Weight:  [59.9 kg] 59.9 kg (12/13 2229)    General:   Pleasant, well developed female in no acute distress Head:  Normocephalic and atraumatic. Eyes: sclerae anicteric,conjunctive pink  Heart:  regular rate and rhythm, no murmurs or gallops Pulm: Clear anteriorly; no wheezing Abdomen:  Soft, Obese AB, skin exam scars- well healed large vertical upper AB scar, Normal bowel sounds. mild tenderness in the epigastrium. With guarding and Without rebound, without hepatomegaly. No fluid wave or shifting dullness noted Extremities:  Without edema. Msk:  Symmetrical without gross deformities. Peripheral pulses intact.  Neurologic:  Alert and  oriented x4;  grossly normal neurologically. No clonus or asterixis noted.  Skin:  Dry and intact without significant lesions or rashes. No Jaundice Psychiatric: Demonstrates good judgement and reason without abnormal affect or behaviors.  LAB RESULTS: Recent Labs    09/09/21 2242 09/10/21 1007  WBC 10.8* 9.3  HGB 12.3 12.1  HCT 37.9 35.8*  PLT 216 218   BMET Recent Labs    09/09/21 2242  NA 136  K 3.2*  CL 101  CO2 26  GLUCOSE 99  BUN 12  CREATININE 0.67  CALCIUM 9.4   LFT Recent Labs    09/09/21 2242  PROT 6.6  ALBUMIN 3.3*  AST 34  ALT 48*  ALKPHOS 161*  BILITOT 0.8   PT/INR No results for input(s):  LABPROT, INR in the last 72 hours.  STUDIES: CT Abdomen Pelvis W Contrast  Result Date: 09/10/2021 CLINICAL DATA:  Epigastric pain, black stools, nausea, vomiting, fevers EXAM: CT ABDOMEN AND PELVIS WITH CONTRAST TECHNIQUE: Multidetector CT imaging of the abdomen and pelvis was performed using the standard protocol following bolus administration of intravenous contrast. CONTRAST:  OMNIPAQUE IOHEXOL 300 MG/ML  SOLN COMPARISON:  09/07/2018 FINDINGS: Lower chest: No acute abnormality. Hepatobiliary: No focal liver abnormality is seen. Somewhat  coarse, nodular contour of the liver. Status post cholecystectomy. No biliary dilatation. Pancreas: Unremarkable. No pancreatic ductal dilatation or surrounding inflammatory changes. Spleen: New splenomegaly, maximum coronal span 14.5 cm. Adrenals/Urinary Tract: Adrenal glands are unremarkable. Kidneys are normal, without renal calculi, solid lesion, or hydronephrosis. Bladder is unremarkable. Stomach/Bowel: Prior hiatal hernia repair. Appendix is not clearly visualized and may be surgically absent no evidence of bowel wall thickening, distention, or inflammatory changes. Vascular/Lymphatic: No significant vascular findings are present. No enlarged abdominal or pelvic lymph nodes. Reproductive: No mass or other significant abnormality. Other: Fat containing umbilical hernia.  No abdominopelvic ascites. Musculoskeletal: No acute or significant osseous findings. IMPRESSION: 1. No acute CT findings of the abdomen or pelvis to explain epigastric pain, nausea, or black stools. 2. Somewhat coarse, nodular contour of the liver, suggestive of cirrhosis. 3. New splenomegaly, maximum coronal span 14.5 cm. Electronically Signed   By: Jearld Lesch M.D.   On: 09/10/2021 11:23   DG Chest Portable 1 View  Result Date: 09/10/2021 CLINICAL DATA:  Pain, diarrhea, emesis EXAM: PORTABLE CHEST 1 VIEW COMPARISON:  07/24/2017 FINDINGS: The heart size and mediastinal contours are  within normal limits. Both lungs are clear. The visualized skeletal structures are unremarkable. IMPRESSION: No acute abnormality of the lungs in [facility] portable projection. Electronically Signed   By: Jearld Lesch M.D.   On: 09/10/2021 10:33      Impression    Melena with possible hematemesis Patient is not on blood thinners, no NSAID use HGB 12.1 MCV 80.1 Platelets 218 BUN 12 Cr 0.67 No elevation of BUN, hemoglobin stable at 12.1. Patient does have history of melena and UGI bleed in 2017, status post paraesophageal hernia repaired x2 in 2018 Hemoccult positive in ER, last bowel movement yesterday Does not appear to be actively bleeding at this time, does not appear to have significant portal hypertension or active bleeding, doubtful variceal bleed.  Cirrhosis, unclear etiology WBC 9.3 HGB 12.1 MCV 80.1 Platelets 218 AST 34 ALT 48  Alkphos 161 TBili 0.8 GFR >60  INR No result (last INR 0.96 2019) No ascites on CT, does show splenomegaly but no other evidence of portal hypertension. No ETOH history Hepatitis C pending No evidence of hepatic encephalopathy  GERD status post paraesophageal hernia repaired x2 in 2018 Continue carafte, please hold tomorrow AM prior to EGD at 10 Continue PPI BID  COVID-positive No symptoms at this of SOB, CP, cough.  Primary teat will treat and follow  Urinary tract infection Primary team will treat and follow  DM2 COPD  on room air and stable at this time Still smokes occ Bipolar 1 disorder   Colon cancer screening    Plan   Get INR to calculate MELD score Will continue to trend LFTs. We will also proceed with AMA, ANA, and ASMA, fto rule out etiologies of chronic liver disease. Hepatitis C antibody pending (hep C negative 2018) -Plan for EGD tomorrow. I thoroughly discussed the procedure to include nature, alternatives, benefits, and risks including but not limited to bleeding, perforation, infection, anesthesia/cardiac and pulmonary  complications. Patient provides understanding and gave verbal consent to proceed. - Protonix 40 mg IV BID, continue carafate hold dose tomorrow prior to EGD -Clear liquid diet, NPO at midnight. -Continue daily CBC with transfusion as needed to maintain Hgb >7.  I discussed risks of EGD with patient today, including risk of sedation, bleeding or perforation. Patient expressed understanding and agrees to proceed with procedure. Will need outpatient colon cancer screening.  Doree Albee  09/10/2021, 11:32 AM

## 2021-09-10 NOTE — Consult Note (Signed)
Consultation  Referring Provider:  Dr. Virgina Norfolk    Primary Care Physician:  Patient, No Pcp Per (Inactive) Primary Gastroenterologist:  06/2016 [facility]       Reason for Consultation:    Melena, abnormal CT AB cirrhosis          HPI:   Shelby Hatfield is a 45 y.o. female with history of GI bleed 2017, GERD with prior paraesophageal hernia repair laparoscopically 08/2016 at [facility] repair, hyperlipidemia, type 2 diabetes, COPD, bipolar 1 disorder presents with melena.  ED course: Potassium 3.2, alkaline phosphatase 161, albumin 3.3, ALT 48, AST 34, total bilirubin 0.8 White blood cell count 9.3, hemoglobin 11.2, MCV 80, platelets 218 Positive fecal occult blood in the ER Urine nitrate positive, moderate leukocytes, trace blood, many bacteria CT scan shows somewhat coarse, nodular contour of liver, suggestive of cirrhosis, new splenomegaly 14.5 cm, status postcholecystectomy.  No biliary dilatation.  Pancreas unremarkable.  Prior hiatal hernia repair.  Patient has history of upper GI bleed September 2017 showing large hiatal hernia reflux esophagitis and gastritis, repeat endoscopy 08/2016 showed large hiatal hernia and large gastric ulcer straddling diaphragmatic hiatus, that Hemm is having 3 days of hematemesis and dark stools. Patient then underwent paraesophageal hernia repair 08/2016 laparoscopically at [facility] with posterior lateral thoracotomy.  At that time gastroparesis was also suspected. Patient's states she had repeat paraesophageal hernia repair after the first 1 possibly 11/03/2016 due to failure. H. pylori was negative at that time.  She was in her normal state of health from 2017 until now until about 5 days ago.  States for her daughter's birthday on 09/05/2021 had pizza and spaghetti, that morning at 4 AM she woke up vomiting. Patient states the first 3 days had metallic taste in her mouth and was read, "look like raspberries", this  eventually became more like green bile and clear.  Patient states she had vomiting with attempt to eat or drink anything.  Was also having worsening reflux at that time.  Was still on outpatient Carafate and Pepcid. Patient then states about 3 days ago started to have dark tarry stools.  Last bowel movement was yesterday and was dark and dark and tarry. When she has formed stools about 3-4 times daily. Patient yesterday morning began to have subjective fever, no chills. Patient denies cough, shortness of breath, chest pain, dizziness, palpitations.  On CT possible nodularity of liver and early cirrhosis. Patient denies any weight gain, states she has occasional abdominal swelling, no peripheral edema. Denies any yellowing of skin or eyes, denies dark urine, denies itching. Patient denies alcohol use other than a 15-month period in 2007, denies NSAIDs. Does have 13 tattoos, states previous boyfriend did have hepatitis C possible exposure last tested several years ago. Mother with history of cirrhosis and ulcers however she was an alcoholic, dad with ulcers, maternal grandfather with ulcers. She has no siblings, 4 grandkids aged 19, 7, 6 and 2.  Previous GI work up: 05/2016 EGD with Dr. Madilyn Fireman for GI bleed,melena, heme +, showed large hiatal hernia, LA Grade A reflux esophagitis, gastritis. Ct possible gastric and esophageal varices at that time.  Was scheduled for colonoscopy 06/30/2016 but this was canceled.   Past Medical History:  Diagnosis Date   Adenotonsillar hypertrophy 03/2012   snores during sleep; denies apnea; states occ. wakes up coughing   Anemia    Anxiety    Bipolar 1 disorder (HCC)    COPD (chronic obstructive pulmonary  disease) (HCC)    stage 2, 2 liters of oxygen at night for ATX   Depression    Diabetes mellitus without complication (HCC)    Drug-seeking behavior    GERD (gastroesophageal reflux disease)    GI bleed    Heart failure (HCC)    Hypercholesterolemia     Migraines    Obesity    Pneumonia    Wears dentures    upper denture    Past Surgical History:  Procedure Laterality Date   APPENDECTOMY     BIOPSY  02/14/2016   Procedure: BIOPSY;  Surgeon: West Bali, MD;  Location: [facility];  Service: Endoscopy;;  gastric biopsies   cardiac thoracic surgery     CESAREAN SECTION  08/31/2001   total of  3   CHOLECYSTECTOMY     DILATION AND EVACUATION  04/09/2000   ESOPHAGEAL DILATION N/A 09/03/2016   Procedure: ESOPHAGEAL DILATION;  Surgeon: Corbin Ade, MD;  Location: [facility];  Service: Endoscopy;  Laterality: N/A;   ESOPHAGOGASTRODUODENOSCOPY (EGD) WITH PROPOFOL N/A 02/14/2016   Dr. Darrick Penna: Large hiatal hernia, nonbleeding cratered gastric ulcer. Multiple nonbleeding erosions in the gastric fundus. Diffuse moderate inflammation, erosions, erythema of the gastric antrum. All biopsies benign with no evidence of H. pylori.   ESOPHAGOGASTRODUODENOSCOPY (EGD) WITH PROPOFOL N/A 06/02/2016   Procedure: ESOPHAGOGASTRODUODENOSCOPY (EGD) WITH PROPOFOL;  Surgeon: Dorena Cookey, MD;  Location: [facility];  Service: Endoscopy;  Laterality: N/A;   ESOPHAGOGASTRODUODENOSCOPY (EGD) WITH PROPOFOL N/A 09/03/2016   Procedure: ESOPHAGOGASTRODUODENOSCOPY (EGD) WITH PROPOFOL;  Surgeon: Corbin Ade, MD;  Location: [facility];  Service: Endoscopy;  Laterality: N/A;   HIATAL HERNIA REPAIR  10/2015   planning for repeat surgery in 1/18   open paraesophageal hernia repair with gastroexy  09/2015   Dr. Francee Gentile   TONSILLECTOMY     adenoidectomy   TONSILLECTOMY AND ADENOIDECTOMY  04/12/2012   Procedure: TONSILLECTOMY AND ADENOIDECTOMY;  Surgeon: Darletta Moll, MD;  Location: [facility];  Service: ENT;  Laterality: Bilateral;   TUBAL LIGATION  08/31/2001    Family History  Problem Relation Age of Onset   Cancer Other    Hypertension Mother    Heart disease Mother    Cancer Mother        ovarian   Cancer Maternal Aunt        not sure  primary, in kidney and colon   Cancer Maternal Uncle        lung cancer    Stroke Maternal Grandfather      Social History   Tobacco Use   Smoking status: Some Days    Packs/day: 0.33    Years: 18.00    Pack years: 5.94    Types: Cigarettes   Smokeless tobacco: Never  Vaping Use   Vaping Use: Former  Substance Use Topics   Alcohol use: No    Alcohol/week: 0.0 standard drinks   Drug use: Yes    Types: Marijuana, Cocaine    Prior to Admission medications   Medication Sig Start Date End Date Taking? Authorizing Provider  Aspirin-Salicylamide-Caffeine (BC HEADACHE PO) Take 1 packet by mouth daily as needed (headache).    [provider]  famotidine (PEPCID) 20 MG tablet Take 1 tablet (20 mg total) by mouth 2 (two) times daily. 09/07/18   Liberty Handy, PA-C  HYDROcodone-acetaminophen (NORCO/VICODIN) 5-325 MG tablet Take 1 tablet by mouth every 6 (six) hours as needed for severe pain. 12/23/19  Zadie Rhine, MD  sucralfate (CARAFATE) 1 g tablet Take 1 tablet (1 g total) by mouth 4 (four) times daily -  with meals and at bedtime. 09/07/18 12/23/19  Liberty Handy, PA-C    No current facility-administered medications for this encounter.   Current Outpatient Medications  Medication Sig Dispense Refill   Aspirin-Salicylamide-Caffeine (BC HEADACHE PO) Take 1 packet by mouth daily as needed (headache).     famotidine (PEPCID) 20 MG tablet Take 1 tablet (20 mg total) by mouth 2 (two) times daily. 30 tablet 0   HYDROcodone-acetaminophen (NORCO/VICODIN) 5-325 MG tablet Take 1 tablet by mouth every 6 (six) hours as needed for severe pain. 10 tablet 0    Allergies as of 09/09/2021 - Review Complete 09/09/2021  Allergen Reaction Noted   Lidocaine viscous hcl Other (See Comments) 06/01/2016   Penicillins Itching and Rash 07/17/2011    Review of Systems:    Constitutional: fever No weight loss, chills, weakness or fatigue Skin: No rash or itching Cardiovascular: No  chest pain, chest pressure or palpitations   Respiratory: No SOB or cough Gastrointestinal: See HPI and otherwise negative Genitourinary: No dysuria or change in urinary frequency Neurological: No headache, dizziness or syncope Musculoskeletal: No new muscle or joint pain Hematologic: No bleeding or bruising Psychiatric: No history of depression or anxiety     Physical Exam:  Vital signs in last 24 hours: Temp:  [98.6 F (37 C)-99 F (37.2 C)] 98.6 F (37 C) (12/14 0614) Pulse Rate:  [55-87] 55 (12/14 1030) Resp:  [9-20] 9 (12/14 1030) BP: (126-148)/(87-99) 148/87 (12/14 1030) SpO2:  [98 %-100 %] 98 % (12/14 1030) Weight:  [59.9 kg] 59.9 kg (12/13 2229)    General:   Pleasant, well developed female in no acute distress Head:  Normocephalic and atraumatic. Eyes: sclerae anicteric,conjunctive pink  Heart:  regular rate and rhythm, no murmurs or gallops Pulm: Clear anteriorly; no wheezing Abdomen:  Soft, Obese AB, skin exam scars- well healed large vertical upper AB scar, Normal bowel sounds. mild tenderness in the epigastrium. With guarding and Without rebound, without hepatomegaly. No fluid wave or shifting dullness noted Extremities:  Without edema. Msk:  Symmetrical without gross deformities. Peripheral pulses intact.  Neurologic:  Alert and  oriented x4;  grossly normal neurologically. No clonus or asterixis noted.  Skin:  Dry and intact without significant lesions or rashes. No Jaundice Psychiatric: Demonstrates good judgement and reason without abnormal affect or behaviors.  LAB RESULTS: Recent Labs    09/09/21 2242 09/10/21 1007  WBC 10.8* 9.3  HGB 12.3 12.1  HCT 37.9 35.8*  PLT 216 218   BMET Recent Labs    09/09/21 2242  NA 136  K 3.2*  CL 101  CO2 26  GLUCOSE 99  BUN 12  CREATININE 0.67  CALCIUM 9.4   LFT Recent Labs    09/09/21 2242  PROT 6.6  ALBUMIN 3.3*  AST 34  ALT 48*  ALKPHOS 161*  BILITOT 0.8   PT/INR No results for input(s):  LABPROT, INR in the last 72 hours.  STUDIES: CT Abdomen Pelvis W Contrast  Result Date: 09/10/2021 CLINICAL DATA:  Epigastric pain, black stools, nausea, vomiting, fevers EXAM: CT ABDOMEN AND PELVIS WITH CONTRAST TECHNIQUE: Multidetector CT imaging of the abdomen and pelvis was performed using the standard protocol following bolus administration of intravenous contrast. CONTRAST:  OMNIPAQUE IOHEXOL 300 MG/ML  SOLN COMPARISON:  09/07/2018 FINDINGS: Lower chest: No acute abnormality. Hepatobiliary: No focal liver abnormality is seen. Somewhat  coarse, nodular contour of the liver. Status post cholecystectomy. No biliary dilatation. Pancreas: Unremarkable. No pancreatic ductal dilatation or surrounding inflammatory changes. Spleen: New splenomegaly, maximum coronal span 14.5 cm. Adrenals/Urinary Tract: Adrenal glands are unremarkable. Kidneys are normal, without renal calculi, solid lesion, or hydronephrosis. Bladder is unremarkable. Stomach/Bowel: Prior hiatal hernia repair. Appendix is not clearly visualized and may be surgically absent no evidence of bowel wall thickening, distention, or inflammatory changes. Vascular/Lymphatic: No significant vascular findings are present. No enlarged abdominal or pelvic lymph nodes. Reproductive: No mass or other significant abnormality. Other: Fat containing umbilical hernia.  No abdominopelvic ascites. Musculoskeletal: No acute or significant osseous findings. IMPRESSION: 1. No acute CT findings of the abdomen or pelvis to explain epigastric pain, nausea, or black stools. 2. Somewhat coarse, nodular contour of the liver, suggestive of cirrhosis. 3. New splenomegaly, maximum coronal span 14.5 cm. Electronically Signed   By: Jearld Lesch M.D.   On: 09/10/2021 11:23   DG Chest Portable 1 View  Result Date: 09/10/2021 CLINICAL DATA:  Pain, diarrhea, emesis EXAM: PORTABLE CHEST 1 VIEW COMPARISON:  07/24/2017 FINDINGS: The heart size and mediastinal contours are  within normal limits. Both lungs are clear. The visualized skeletal structures are unremarkable. IMPRESSION: No acute abnormality of the lungs in [facility] portable projection. Electronically Signed   By: Jearld Lesch M.D.   On: 09/10/2021 10:33      Impression    Melena with possible hematemesis Patient is not on blood thinners, no NSAID use HGB 12.1 MCV 80.1 Platelets 218 BUN 12 Cr 0.67 No elevation of BUN, hemoglobin stable at 12.1. Patient does have history of melena and UGI bleed in 2017, status post paraesophageal hernia repaired x2 in 2018 Hemoccult positive in ER, last bowel movement yesterday Does not appear to be actively bleeding at this time, does not appear to have significant portal hypertension or active bleeding, doubtful variceal bleed.  Cirrhosis, unclear etiology WBC 9.3 HGB 12.1 MCV 80.1 Platelets 218 AST 34 ALT 48  Alkphos 161 TBili 0.8 GFR >60  INR No result (last INR 0.96 2019) No ascites on CT, does show splenomegaly but no other evidence of portal hypertension. No ETOH history Hepatitis C pending No evidence of hepatic encephalopathy  GERD status post paraesophageal hernia repaired x2 in 2018 Continue carafte, please hold tomorrow AM prior to EGD at 10 Continue PPI BID  COVID-positive No symptoms at this of SOB, CP, cough.  Primary teat will treat and follow  Urinary tract infection Primary team will treat and follow  DM2 COPD  on room air and stable at this time Still smokes occ Bipolar 1 disorder   Colon cancer screening    Plan   Get INR to calculate MELD score Will continue to trend LFTs. We will also proceed with AMA, ANA, and ASMA, fto rule out etiologies of chronic liver disease. Hepatitis C antibody pending (hep C negative 2018) -Plan for EGD tomorrow. I thoroughly discussed the procedure to include nature, alternatives, benefits, and risks including but not limited to bleeding, perforation, infection, anesthesia/cardiac and pulmonary  complications. Patient provides understanding and gave verbal consent to proceed. - Protonix 40 mg IV BID, continue carafate hold dose tomorrow prior to EGD -Clear liquid diet, NPO at midnight. -Continue daily CBC with transfusion as needed to maintain Hgb >7.  I discussed risks of EGD with patient today, including risk of sedation, bleeding or perforation. Patient expressed understanding and agrees to proceed with procedure. Will need outpatient colon cancer screening.  Doree Albee  09/10/2021, 11:32 AM

## 2021-09-10 NOTE — ED Notes (Signed)
Per MD Ariwodo, IV potassium can be paused for a few hours to allow patient to have a break from the pain. 2 bags of potassium will be restarted at a later time.

## 2021-09-10 NOTE — ED Notes (Signed)
Pt uncomfortable with potassium running and wants it stopped. MD Ariwodo notified.

## 2021-09-10 NOTE — ED Notes (Signed)
Pt states she has been vaccinated against covid. Admitting MD requesting this information

## 2021-09-10 NOTE — ED Notes (Signed)
Called CT- pt has IV and pain medication. Will be able to sit still. CT will come get pt asap

## 2021-09-10 NOTE — ED Notes (Signed)
Pt requesting something for pain. PRN morphine given.

## 2021-09-10 NOTE — H&P (Signed)
Shelby Hatfield, MRN:  [id number], DOB:  [birth year], LOS: 0 ADMISSION DATE:  09/09/2021, Primary: Patient, No Pcp Per (Inactive)  CHIEF COMPLAINT: Nausea and vomiting  Medical Service: Internal Medicine Teaching Service         Attending Physician: Dr. Lucious Groves, DO    First Contact: Dr. Vinetta Bergamo Pager: [phone]  Second Contact: Dr. Allyson Sabal Pager: [phone]       After Hours (After 5p/  First Contact Pager: [phone]  weekends / holidays): Second Contact Pager: [phone]    History of present illness   45 year old female with an extensive GI surgical history including open paraesophageal hernia repair in 2017 due to esophagitis and gastritis complicated by failure to thrive resulting in repeat hernia repair with a posterior lateral thoracotomy approach.  She presented to the ED last evening for a 4-day history of irretractable nausea, vomiting, abdominal pain, hematemesis and melena.  The abdominal pain is primarily epigastric and left upper quadrant.  It began after eating spaghetti and some pizza.  She denies any change in symptoms over the past 4 days.  She notes that she does have some abdominal pain from time to time however she feels that this is the worst pain she has had since her prior surgery.  She endorses a fever over the past 2 days at home with T-max of 102.6 F.  She has been unable to tolerate any oral intake over the past 4 days and notes that even water makes this abdominal pain worse.  She has been using oral and suppository Phenergan without much relief. She denies cough, shortness of breath, dizziness, lightheadedness, or chest pain.  Denies dysuria or suprapubic pain.  She denies any significant history of alcohol use.  She notes that she went through about a 39-month period in the past where she would drink roughly a sixpack a day however she has not done that for several years. She does reveal that she may have had an exposure to hepatitis C in the past by a  prior boyfriend.  She notes that she has been tested for hepatitis C in the past however is not sure if this occurred before or after the exposure.  Past Medical History  She,  has a past medical history of Adenotonsillar hypertrophy (03/2012), Anemia, Anxiety, Bipolar 1 disorder ([place]), COPD (chronic obstructive pulmonary disease) ([place]), Depression, Diabetes mellitus without complication ([place]), Drug-seeking behavior, GERD (gastroesophageal reflux disease), GI bleed, Heart failure ([place]), Hypercholesterolemia, Migraines, Obesity, Pneumonia, and Wears dentures.   Home Medications     Prior to Admission medications   Medication Sig Start Date End Date Taking? Authorizing Provider  Aspirin-Salicylamide-Caffeine (BC HEADACHE PO) Take 1 packet by mouth daily as needed (headache).    [provider]  famotidine (PEPCID) 20 MG tablet Take 1 tablet (20 mg total) by mouth 2 (two) times daily. 09/07/18   Kinnie Feil, PA-C  HYDROcodone-acetaminophen (NORCO/VICODIN) 5-325 MG tablet Take 1 tablet by mouth every 6 (six) hours as needed for severe pain. 12/23/19   Ripley Fraise, MD  sucralfate (CARAFATE) 1 g tablet Take 1 tablet (1 g total) by mouth 4 (four) times daily -  with meals and at bedtime. 09/07/18 12/23/19  Kinnie Feil, PA-C    Allergies    Allergies as of 09/09/2021 - Review Complete 09/09/2021  Allergen Reaction Noted   Lidocaine viscous hcl Other (See Comments) 06/01/2016   Penicillins Itching and Rash 07/17/2011    Social History   reports that  she has been smoking cigarettes. She has a 5.94 pack-year smoking history. She has never used smokeless tobacco. She reports current drug use. Drugs: Marijuana and Cocaine. She reports that she does not drink alcohol.   Family History   Her family history includes Cancer in her maternal aunt, maternal uncle, mother, and another family member; Heart disease in her mother; Hypertension in her mother; Stroke in her maternal  grandfather.   ROS  10 point review of systems negative unless stated in the HPI.  Objective   Blood pressure (!) 121/93, pulse 71, temperature 98.6 F (37 C), temperature source Oral, resp. rate 18, height 5\' 1"  (1.549 m), weight 59.9 kg, SpO2 100 %.    General: Chronically ill-appearing female in no acute distress HEENT: Head atraumatic, poor dentition.  No scleral icterus or conjunctival injection Cardiac: Heart is regular rate and rhythm.  No lower extremity edema Pulm: Breathing comfortably on room air, lung sounds are clear GI: Abdomen is soft, nondistended.  Diffusely tender to palpation (upper abdomen more than lower).  Bowel sounds are hyperactive.  No telangiectasias or other features of cirrhosis. Neuro: Alert and oriented x4 MSK: Normal muscle bulk and tone Skin: Numerous tattoos, no jaundice Significant Diagnostic Tests:  CT abdomen/pelvis Somewhat coarse, nodular contour of the liver suggestive of cirrhosis.  Status postcholecystectomy Pancreas is unremarkable. New splenomegaly, maximum coronal span 14.5 cm Adrenals, kidneys, and bladder unremarkable Prior hiatal hernia repair.  No bowel wall abnormalities. Fat-containing umbilical hernia  Labs    CBC Latest Ref Rng & Units 09/10/2021 09/09/2021 09/07/2018  WBC 4.0 - 10.5 K/uL 9.3 10.8(H) 9.2  Hemoglobin 12.0 - 15.0 g/dL 14/07/2018 32.9 51.8  Hematocrit 36.0 - 46.0 % 35.8(L) 37.9 41.7  Platelets 150 - 400 K/uL 218 216 377   BMP Latest Ref Rng & Units 09/09/2021 09/07/2018 08/18/2018  Glucose 70 - 99 mg/dL 99 08/20/2018) 660(Y)  BUN 6 - 20 mg/dL 12 6 301(S)  Creatinine 0.44 - 1.00 mg/dL <0(F 0.93 2.35  Sodium 135 - 145 mmol/L 136 138 136  Potassium 3.5 - 5.1 mmol/L 3.2(L) 3.6 3.6  Chloride 98 - 111 mmol/L 101 107 111  CO2 22 - 32 mmol/L 26 22 20(L)  Calcium 8.9 - 10.3 mg/dL 9.4 9.5 9.2    Summary  45 year old female with extensive GI including open paraesophageal hernia repair with gastropexy (2017) admitted for  management of irretractable nausea and vomiting and evaluation of GI bleed.  Assessment & Plan:  Principal Problem:   GI bleed  GI bleed Patient endorses both melena and hematemesis.  Hemoglobin has been stable since yesterday.  Hemoccult positive in the ED.  Suspect she may have a slow bleeding gastric ulcer. Afebrile here, no leukocytosis - Appreciate GI consult.  Planning for EGD in a.m. - Trend hemoglobin - Continue Protonix and Carafate - Received 2 doses of Dilaudid in the ED.  We will try to de-escalate to morphine. - As needed Zofran for nausea  Liver fibrosis incidentally noted on abdominal CT, splenomegaly Platelets are normal, albumin mildly low at 3.3.  Will need additional work-up to evaluate for synthetic dysfunction.  Question if splenomegaly is related to portal hypertension She denies a significant alcohol use history.  Possible hepatitis C exposure in the past. - Check INR - Hepatitis C antibodies, autoimmune work-up per GI - Will start Rocephin in the setting of possible cirrhosis and GI bleed  Asymptomatic COVID positive.  - Checking on COVID vaccination status - Can plan to give her paxlovid  if unvaccinated  [place].  Patient denies urinary symptoms.  She is currently on Rocephin for the GI bleed however I do not think this will need to be continued for the purpose of UTI treatment.  Hypokalemia - We will give 80 mEq of IV potassium for repletion Best practice:  CODE STATUS: Full DVT for prophylaxis: Hold for active bleeding Social considerations/Family communication: Patient will update family Dispo: Admit patient to Inpatient with expected length of stay greater than 2 midnights.   Mitzi Hansen, MD Internal Medicine Resident PGY-3 [facility] Pager: [phone] 09/10/2021 1:29 PM

## 2021-09-10 NOTE — ED Provider Notes (Addendum)
[facility] EMERGENCY DEPARTMENT Provider Note   CSN: [id number] Arrival date & time: 09/09/21  1951     History Chief Complaint  Patient presents with   Diarrhea   Melena   Emesis    Shelby Hatfield is a 45 y.o. female.  The history is provided by the patient.  Diarrhea Quality:  Black and tarry Severity:  Mild Onset quality:  Gradual Duration:  3 days Timing:  Intermittent Progression:  Unchanged Relieved by:  Nothing Worsened by:  Nothing Associated symptoms: abdominal pain, chills, fever (?) and vomiting   Associated symptoms: no arthralgias, no recent cough, no diaphoresis, no headaches, no myalgias and no URI   Emesis Associated symptoms: abdominal pain, chills, diarrhea and fever (?)   Associated symptoms: no arthralgias, no cough, no headaches, no myalgias, no sore throat and no URI   Risk factors: prior abdominal surgery       Past Medical History:  Diagnosis Date   Adenotonsillar hypertrophy 03/2012   snores during sleep; denies apnea; states occ. wakes up coughing   Anemia    Anxiety    Bipolar 1 disorder (HCC)    COPD (chronic obstructive pulmonary disease) (HCC)    stage 2, 2 liters of oxygen at night for ATX   Depression    Diabetes mellitus without complication ([place])    Drug-seeking behavior    GERD (gastroesophageal reflux disease)    GI bleed    Heart failure ([place])    Hypercholesterolemia    Migraines    Obesity    Pneumonia    Wears dentures    upper denture    Patient Active Problem List   Diagnosis Date Noted   Menorrhagia with regular cycle 06/07/2017   Cameron ulcer    Rectal bleeding 06/30/2016   Chronic diarrhea 06/30/2016   Gastroesophageal reflux disease with esophagitis    Hiatal hernia    Morbid obesity due to excess calories ([place])    Protein-calorie malnutrition, severe 06/02/2016   PUD (peptic ulcer disease) 06/01/2016   Abdominal pain, chronic, epigastric 02/12/2016   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   Pulmonary nodules 02/12/2016   Acute on chronic diastolic heart failure ([place]) 01/21/2015   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   Lower leg edema 01/19/2015   Diabetes mellitus type 2 in obese (HCC)    COPD (chronic obstructive pulmonary disease) ([place]) 08/18/2014   Chest tightness 08/18/2014   Opiate addiction ([place]) 05/22/2014   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) 05/22/2014   Mixed bipolar I disorder ([place]) 04/20/2013   Opiate dependence, continuous ([place]) 04/20/2013   Benzodiazepine dependence ([place]) 04/20/2013    Past Surgical History:  Procedure Laterality Date   APPENDECTOMY     BIOPSY  02/14/2016   Procedure: BIOPSY;  Surgeon: Danie Binder, MD;  Location[facility];  Service: Endoscopy;;  gastric biopsies   cardiac thoracic surgery     CESAREAN SECTION  08/31/2001   total of  3   CHOLECYSTECTOMY     DILATION AND EVACUATION  04/09/2000   ESOPHAGEAL DILATION N/A 09/03/2016   Procedure: ESOPHAGEAL DILATION;  Surgeon: Daneil Dolin, MD;  Location: [facility];  Service: Endoscopy;  Laterality: N/A;   ESOPHAGOGASTRODUODENOSCOPY (EGD) WITH PROPOFOL N/A 02/14/2016   Dr. Oneida Alar: Large hiatal hernia, nonbleeding cratered gastric ulcer. Multiple nonbleeding erosions in the gastric fundus. Diffuse moderate inflammation, erosions, erythema of the gastric antrum. All biopsies benign with no evidence of H. pylori.   ESOPHAGOGASTRODUODENOSCOPY (EGD) WITH PROPOFOL N/A 06/02/2016   Procedure:  ESOPHAGOGASTRODUODENOSCOPY (EGD) WITH PROPOFOL;  Surgeon: Teena Irani, MD;  Location: [facility];  Service: Endoscopy;  Laterality: N/A;   ESOPHAGOGASTRODUODENOSCOPY (EGD) WITH PROPOFOL N/A 09/03/2016   Procedure: ESOPHAGOGASTRODUODENOSCOPY (EGD) WITH PROPOFOL;  Surgeon: Daneil Dolin, MD;  [facility];  Service: Endoscopy;  Laterality: N/A;   HIATAL HERNIA REPAIR  10/2015   planning for repeat surgery in 1/18   open paraesophageal hernia repair with gastroexy  09/2015   Dr. Demetrio Lapping    TONSILLECTOMY     adenoidectomy   TONSILLECTOMY AND ADENOIDECTOMY  04/12/2012   Procedure: TONSILLECTOMY AND ADENOIDECTOMY;  Surgeon: Ascencion Dike, MD;  Location: [facility];  Service: ENT;  Laterality: Bilateral;   TUBAL LIGATION  08/31/2001     OB History     Gravida  5   Para  3   Term  2   Preterm  1   AB  2   Living  3      SAB  2   IAB      Ectopic      Multiple      Live Births  3           Family History  Problem Relation Age of Onset   Cancer Other    Hypertension Mother    Heart disease Mother    Cancer Mother        ovarian   Cancer Maternal Aunt        not sure primary, in kidney and colon   Cancer Maternal Uncle        lung cancer    Stroke Maternal Grandfather     Social History   Tobacco Use   Smoking status: Some Days    Packs/day: 0.33    Years: 18.00    Pack years: 5.94    Types: Cigarettes   Smokeless tobacco: Never  Vaping Use   Vaping Use: Former  Substance Use Topics   Alcohol use: No    Alcohol/week: 0.0 standard drinks   Drug use: Yes    Types: Marijuana, Cocaine    Home Medications Prior to Admission medications   Medication Sig Start Date End Date Taking? Authorizing Provider  Aspirin-Salicylamide-Caffeine (BC HEADACHE PO) Take 1 packet by mouth daily as needed (headache).    [provider]  famotidine (PEPCID) 20 MG tablet Take 1 tablet (20 mg total) by mouth 2 (two) times daily. 09/07/18   Kinnie Feil, PA-C  HYDROcodone-acetaminophen (NORCO/VICODIN) 5-325 MG tablet Take 1 tablet by mouth every 6 (six) hours as needed for severe pain. 12/23/19   Ripley Fraise, MD  sucralfate (CARAFATE) 1 g tablet Take 1 tablet (1 g total) by mouth 4 (four) times daily -  with meals and at bedtime. 09/07/18 12/23/19  Kinnie Feil, PA-C    Allergies    Lidocaine viscous hcl and Penicillins  Review of Systems   Review of Systems  Constitutional:  Positive for chills and fever (?). Negative  for diaphoresis.  HENT:  Negative for ear pain and sore throat.   Eyes:  Negative for pain and visual disturbance.  Respiratory:  Negative for cough and shortness of breath.   Cardiovascular:  Negative for chest pain and palpitations.  Gastrointestinal:  Positive for abdominal pain, blood in stool, diarrhea, nausea and vomiting.  Genitourinary:  Negative for dysuria and hematuria.  Musculoskeletal:  Negative for arthralgias, back pain and myalgias.  Skin:  Negative for color change and rash.  Neurological:  Negative for seizures, syncope and headaches.  All other systems reviewed and are negative.  Physical Exam Updated Vital Signs BP (!) 146/81    Pulse 74    Temp 98.6 F (37 C) (Oral)    Resp 15    Ht 5\' 1"  (1.549 m)    Wt 59.9 kg    SpO2 100%    BMI 24.94 kg/m   Physical Exam Vitals and nursing note reviewed.  Constitutional:      General: She is in acute distress.     Appearance: She is well-developed. She is ill-appearing.  HENT:     Head: Normocephalic and atraumatic.     Nose: Nose normal.     Mouth/Throat:     Mouth: Mucous membranes are moist.  Eyes:     Extraocular Movements: Extraocular movements intact.     Conjunctiva/sclera: Conjunctivae normal.     Pupils: Pupils are equal, round, and reactive to light.  Cardiovascular:     Rate and Rhythm: Normal rate and regular rhythm.     Pulses: Normal pulses.     Heart sounds: Normal heart sounds. No murmur heard. Pulmonary:     Effort: Pulmonary effort is normal. No respiratory distress.     Breath sounds: Normal breath sounds.  Abdominal:     General: Abdomen is flat.     Palpations: Abdomen is soft.     Tenderness: There is abdominal tenderness. There is guarding.  Musculoskeletal:        General: No swelling.     Cervical back: Normal range of motion and neck supple.  Skin:    General: Skin is warm and dry.     Capillary Refill: Capillary refill takes less than 2 seconds.  Neurological:     General: No focal  deficit present.     Mental Status: She is alert.  Psychiatric:        Mood and Affect: Mood normal.    ED Results / Procedures / Treatments   Labs (all labs ordered are listed, but only abnormal results are displayed) Labs Reviewed  CBC WITH DIFFERENTIAL/PLATELET - Abnormal; Notable for the following components:      Result Value   WBC 10.8 (*)    Neutro Abs 8.6 (*)    All other components within normal limits  COMPREHENSIVE METABOLIC PANEL - Abnormal; Notable for the following components:   Potassium 3.2 (*)    Albumin 3.3 (*)    ALT 48 (*)    Alkaline Phosphatase 161 (*)    All other components within normal limits  URINALYSIS, ROUTINE W REFLEX MICROSCOPIC - Abnormal; Notable for the following components:   APPearance CLOUDY (*)    Hgb urine dipstick TRACE (*)    Nitrite POSITIVE (*)    Leukocytes,Ua MODERATE (*)    All other components within normal limits  URINALYSIS, MICROSCOPIC (REFLEX) - Abnormal; Notable for the following components:   Bacteria, UA MANY (*)    All other components within normal limits  CBC - Abnormal; Notable for the following components:   HCT 35.8 (*)    All other components within normal limits  POC OCCULT BLOOD, ED - Abnormal; Notable for the following components:   Fecal Occult Bld POSITIVE (*)    All other components within normal limits  I-STAT BETA HCG BLOOD, ED ([facility], [facility], [facility] ONLY) - Abnormal; Notable for the following components:   I-stat hCG, quantitative 10.2 (*)    All other components within normal limits  URINE CULTURE  RESP PANEL BY RT-PCR (FLU A&B, COVID) ARPGX2  LIPASE, BLOOD  PREGNANCY, URINE  POC URINE PREG, ED    EKG None  Radiology CT Abdomen Pelvis W Contrast  Result Date: 09/10/2021 CLINICAL DATA:  Epigastric pain, black stools, nausea, vomiting, fevers EXAM: CT ABDOMEN AND PELVIS WITH CONTRAST TECHNIQUE: Multidetector CT imaging of the abdomen and pelvis was performed using the standard protocol following bolus  administration of intravenous contrast. CONTRAST:  168mL OMNIPAQUE IOHEXOL 300 MG/ML  SOLN COMPARISON:  09/07/2018 FINDINGS: Lower chest: No acute abnormality. Hepatobiliary: No focal liver abnormality is seen. Somewhat coarse, nodular contour of the liver. Status post cholecystectomy. No biliary dilatation. Pancreas: Unremarkable. No pancreatic ductal dilatation or surrounding inflammatory changes. Spleen: New splenomegaly, maximum coronal span 14.5 cm. Adrenals/Urinary Tract: Adrenal glands are unremarkable. Kidneys are normal, without renal calculi, solid lesion, or hydronephrosis. Bladder is unremarkable. Stomach/Bowel: Prior hiatal hernia repair. Appendix is not clearly visualized and may be surgically absent no evidence of bowel wall thickening, distention, or inflammatory changes. Vascular/Lymphatic: No significant vascular findings are present. No enlarged abdominal or pelvic lymph nodes. Reproductive: No mass or other significant abnormality. Other: Fat containing umbilical hernia.  No abdominopelvic ascites. Musculoskeletal: No acute or significant osseous findings. IMPRESSION: 1. No acute CT findings of the abdomen or pelvis to explain epigastric pain, nausea, or black stools. 2. Somewhat coarse, nodular contour of the liver, suggestive of cirrhosis. 3. New splenomegaly, maximum coronal span 14.5 cm. Electronically Signed   By: Delanna Ahmadi M.D.   On: 09/10/2021 11:23   DG Chest Portable 1 View  Result Date: 09/10/2021 CLINICAL DATA:  Pain, diarrhea, emesis EXAM: PORTABLE CHEST 1 VIEW COMPARISON:  07/24/2017 FINDINGS: The heart size and mediastinal contours are within normal limits. Both lungs are clear. The visualized skeletal structures are unremarkable. IMPRESSION: No acute abnormality of the lungs in [facility] portable projection. Electronically Signed   By: Delanna Ahmadi M.D.   On: 09/10/2021 10:33    Procedures Procedures   Medications Ordered in ED Medications  HYDROmorphone (DILAUDID)  injection 1 mg (has no administration in time range)  ondansetron (ZOFRAN) injection 4 mg (4 mg Intravenous Given 09/10/21 1017)  sodium chloride 0.9 % bolus 1,000 mL (0 mLs Intravenous Stopped 09/10/21 1137)  HYDROmorphone (DILAUDID) injection 1 mg (1 mg Intravenous Given 09/10/21 1017)  cefTRIAXone (ROCEPHIN) 1 g in sodium chloride 0.9 % 100 mL IVPB (0 g Intravenous Stopped 09/10/21 1057)  pantoprazole (PROTONIX) injection 40 mg (40 mg Intravenous Given 09/10/21 1034)  iohexol (OMNIPAQUE) 300 MG/ML solution 100 mL (100 mLs Intravenous Contrast Given 09/10/21 1112)    ED Course  I have reviewed the triage vital signs and the nursing notes.  Pertinent labs & imaging results that were available during my care of the patient were reviewed by me and considered in my medical decision making (see chart for details).    MDM Rules/Calculators/A&P                           Shiah DILLYN NARDIN is a 45 year old female with history of bipolar, COPD, diabetes, history of reflux and GI bleeds who presents to the ED with nausea, vomiting, diarrhea, possibly dark stools.  Normal vitals.  No fever.  Pain mostly in the upper abdomen.  Has had extensive surgical history including multiple abdominal surgeries.  She is having a hard time with fluids.  She thinks that she is passing gas.  She has been nauseous with vomiting.  She states dark stools for the last 3 days.  Unable to get a great stool sample on exam but no obvious dark or tarry stool.  Hemoglobin appears to be around baseline at 12.3.  Hemoccult however was positive.  Lab work is otherwise unremarkable.  Lipase is normal.  Gallbladder and liver enzymes normal.  Urinalysis appears to be consistent with infection but patient denies any urinary symptoms.  We will get a CT scan to further evaluate for bowel obstruction/colitis.  We will send off a repeat CBC as her original CBC was drawn over 10 hours ago.  Hemodynamically she stable.  Hemoglobin appears to be at  baseline.  Suspect that this could be acute on chronic gastritis.  She denies any alcohol or drug use.  Denies any ibuprofen use.  We will give her dose IV Rocephin for suspected urine infection.  We will send urine culture.  No significant leukocytosis, fever tachycardia and no concern for sepsis.  Will give IV Zofran, IV fluids, IV Dilaudid and reevaluate.  Hemoccult is positive however CT scan is unremarkable.  Hemoglobin with no significant change.  Still very symptomatic from pain standpoint and nausea and vomiting standpoint.  Has not had a bowel movement to truly check for melena.  Overall concern for acute on chronic gastritis.  Possibly slow upper GI bleed with melena history.  Jennye Moccasin with gastroenterology to evaluate the patient patient to be admitted to internal medicine team for further care.  COVID test is also positive.  May be why she has had fever and GI symptoms as well.  This chart was dictated using voice recognition software.  Despite best efforts to proofread,  errors can occur which can change the documentation meaning.   Final Clinical Impression(s) / ED Diagnoses Final diagnoses:  Melena  Epigastric pain  Acute cystitis without hematuria  Nausea and vomiting, unspecified vomiting type    Rx / DC Orders ED Discharge Orders     None        Virgina Norfolk, DO 09/10/21 1159    Virgina Norfolk, DO 09/10/21 1217

## 2021-09-11 ENCOUNTER — Encounter (HOSPITAL_COMMUNITY): Payer: Self-pay | Admitting: Internal Medicine

## 2021-09-11 ENCOUNTER — Inpatient Hospital Stay (HOSPITAL_COMMUNITY): Payer: Self-pay | Admitting: Certified Registered"

## 2021-09-11 ENCOUNTER — Encounter (HOSPITAL_COMMUNITY)
Admission: EM | Disposition: A | Discharge status: Home / Self Care | Payer: Self-pay | Source: Home / Self Care | Attending: Internal Medicine

## 2021-09-11 DIAGNOSIS — K922 Gastrointestinal hemorrhage, unspecified: Secondary | ICD-10-CM

## 2021-09-11 DIAGNOSIS — K2100 Gastro-esophageal reflux disease with esophagitis, without bleeding: Secondary | ICD-10-CM

## 2021-09-11 DIAGNOSIS — K2971 Gastritis, unspecified, with bleeding: Secondary | ICD-10-CM

## 2021-09-11 HISTORY — PX: ESOPHAGOGASTRODUODENOSCOPY (EGD) WITH PROPOFOL: SHX5813

## 2021-09-11 LAB — COMPREHENSIVE METABOLIC PANEL
ALT: 34 U/L (ref 0–44)
AST: 29 U/L (ref 15–41)
Albumin: 2.7 g/dL — ABNORMAL LOW (ref 3.5–5.0)
Alkaline Phosphatase: 115 U/L (ref 38–126)
Anion gap: 6 (ref 5–15)
BUN: 9 mg/dL (ref 6–20)
CO2: 22 mmol/L (ref 22–32)
Calcium: 8.8 mg/dL — ABNORMAL LOW (ref 8.9–10.3)
Chloride: 108 mmol/L (ref 98–111)
Creatinine, Ser: 0.66 mg/dL (ref 0.44–1.00)
GFR, Estimated: >60 mL/min (ref >60)
Glucose, Bld: 90 mg/dL (ref 70–99)
Potassium: 3.7 mmol/L (ref 3.5–5.1)
Sodium: 136 mmol/L (ref 135–145)
Total Bilirubin: 0.6 mg/dL (ref 0.3–1.2)
Total Protein: 5.6 g/dL — ABNORMAL LOW (ref 6.5–8.1)

## 2021-09-11 LAB — CBC
HCT: 32.6 % — ABNORMAL LOW (ref 36.0–46.0)
Hemoglobin: 11.5 g/dL — ABNORMAL LOW (ref 12.0–15.0)
MCH: 28.3 pg (ref 26.0–34.0)
MCHC: 35.3 g/dL (ref 30.0–36.0)
MCV: 80.1 fL (ref 80.0–100.0)
Platelets: 197 10*3/uL (ref 150–400)
RBC: 4.07 MIL/uL (ref 3.87–5.11)
RDW: 13.2 % (ref 11.5–15.5)
WBC: 8.1 10*3/uL (ref 4.0–10.5)
nRBC: 0 % (ref 0.0–0.2)

## 2021-09-11 LAB — SURGICAL PCR SCREEN
MRSA, PCR: POSITIVE — AB
Staphylococcus aureus: POSITIVE — AB

## 2021-09-11 LAB — GLUCOSE, CAPILLARY: Glucose-Capillary: 87 mg/dL (ref 70–99)

## 2021-09-11 LAB — HEPATITIS A ANTIBODY, IGM: Hep A IgM: NONREACTIVE

## 2021-09-11 LAB — HEPATITIS C ANTIBODY: HCV Ab: REACTIVE — AB

## 2021-09-11 SURGERY — ESOPHAGOGASTRODUODENOSCOPY (EGD) WITH PROPOFOL
Anesthesia: Monitor Anesthesia Care

## 2021-09-11 MED ORDER — POTASSIUM CHLORIDE 10 MEQ/100ML IV SOLN
10.0000 meq | INTRAVENOUS | Status: AC
  Administered 2021-09-11: 10 meq via INTRAVENOUS
  Filled 2021-09-11: qty 100

## 2021-09-11 MED ORDER — METOCLOPRAMIDE HCL 5 MG/ML IJ SOLN
5.0000 mg | Freq: Three times a day (TID) | INTRAMUSCULAR | Status: DC
  Administered 2021-09-11 – 2021-09-12 (×4): 5 mg via INTRAVENOUS
  Filled 2021-09-11 (×4): qty 2

## 2021-09-11 MED ORDER — LACTATED RINGERS IV SOLN: INTRAVENOUS | Status: DC | PRN

## 2021-09-11 MED ORDER — MUPIROCIN 2 % EX OINT
1.0000 "application " | TOPICAL_OINTMENT | Freq: Two times a day (BID) | CUTANEOUS | Status: DC
  Administered 2021-09-11 – 2021-09-12 (×3): 1 via NASAL
  Filled 2021-09-11: qty 22

## 2021-09-11 MED ORDER — HYDROMORPHONE HCL 1 MG/ML IJ SOLN
1.0000 mg | INTRAMUSCULAR | Status: DC | PRN
  Administered 2021-09-11 – 2021-09-12 (×6): 1 mg via INTRAVENOUS
  Filled 2021-09-11 (×6): qty 1

## 2021-09-11 MED ORDER — PROPOFOL 10 MG/ML IV BOLUS
INTRAVENOUS | Status: DC | PRN
  Administered 2021-09-11: 80 mg via INTRAVENOUS
  Administered 2021-09-11: 20 mg via INTRAVENOUS
  Administered 2021-09-11: 30 mg via INTRAVENOUS

## 2021-09-11 MED ORDER — PROPOFOL 500 MG/50ML IV EMUL
INTRAVENOUS | Status: DC | PRN
  Administered 2021-09-11: 100 ug/kg/min via INTRAVENOUS

## 2021-09-11 MED ORDER — SUCRALFATE 1 GM/10ML PO SUSP
1.0000 g | Freq: Four times a day (QID) | ORAL | Status: DC
  Administered 2021-09-11 – 2021-09-12 (×4): 1 g via ORAL
  Filled 2021-09-11 (×3): qty 10

## 2021-09-11 MED ORDER — CHLORHEXIDINE GLUCONATE CLOTH 2 % EX PADS
6.0000 | MEDICATED_PAD | Freq: Every day | CUTANEOUS | Status: DC
  Administered 2021-09-11 – 2021-09-12 (×2): 6 via TOPICAL

## 2021-09-11 MED ORDER — LACTATED RINGERS IV SOLN
INTRAVENOUS | Status: AC | PRN
  Administered 2021-09-11: 1000 mL via INTRAVENOUS

## 2021-09-11 SURGICAL SUPPLY — 15 items

## 2021-09-11 NOTE — Progress Notes (Signed)
Patient not tolerating potassium infusion even though rate has been decreased to 50 mls/hr already, patient verbalized she doesn't want any more infusion, paged Dr. Leafy Half. Infusion paused per request.

## 2021-09-11 NOTE — Anesthesia Postprocedure Evaluation (Signed)
Anesthesia Post Note  Patient: Shelby Hatfield  Procedure(s) Performed: ESOPHAGOGASTRODUODENOSCOPY (EGD) WITH PROPOFOL     Patient location during evaluation: Endoscopy Anesthesia Type: MAC Level of consciousness: awake Pain management: pain level controlled Vital Signs Assessment: post-procedure vital signs reviewed and stable Respiratory status: spontaneous breathing Postop Assessment: no apparent nausea or vomiting Anesthetic complications: no   No notable events documented.  Last Vitals:  Vitals:   09/11/21 1355 09/11/21 1405  BP: 125/75 122/82  Pulse: (!) 48 (!) 54  Resp: 17 19  Temp:    SpO2: 100% 100%    Last Pain:  Vitals:   09/11/21 1405  TempSrc:   PainSc: 4                  Taylor Spilde

## 2021-09-11 NOTE — Anesthesia Preprocedure Evaluation (Addendum)
Anesthesia Evaluation  Patient identified by MRN, date of birth, ID band Patient awake    Reviewed: Allergy & Precautions, NPO status , Patient's Chart, lab work & pertinent test results  Airway Mallampati: II  TM Distance: >3 FB     Dental   Pulmonary pneumonia, COPD, Current Smoker and Patient abstained from smoking.,    breath sounds clear to auscultation       Cardiovascular negative cardio ROS   Rhythm:Regular Rate:Normal     Neuro/Psych  Headaches,  Neuromuscular disease    GI/Hepatic Neg liver ROS, hiatal hernia, PUD, GERD  ,  Endo/Other  diabetes  Renal/GU negative Renal ROS     Musculoskeletal   Abdominal   Peds  Hematology   Anesthesia Other Findings   Reproductive/Obstetrics                             Anesthesia Physical Anesthesia Plan  ASA: 3  Anesthesia Plan: MAC   Post-op Pain Management:    Induction:   PONV Risk Score and Plan: Propofol infusion and Treatment may vary due to age or medical condition  Airway Management Planned: Simple Face Mask  Additional Equipment:   Intra-op Plan:   Post-operative Plan:   Informed Consent: I have reviewed the patients History and Physical, chart, labs and discussed the procedure including the risks, benefits and alternatives for the proposed anesthesia with the patient or authorized representative who has indicated his/her understanding and acceptance.     Dental advisory given  Plan Discussed with: CRNA  Anesthesia Plan Comments:         Anesthesia Quick Evaluation

## 2021-09-11 NOTE — Progress Notes (Signed)
GI UPDATE:  EGD just completed. Procedure aborted due to residual food in the stomach (aspiration risk) but no heme or blood noted anywhere in the stomach. Pylorus was patent. Patient has moderate to severe esophagitis which I suspect is causing symptoms and likely her prior bleeding, perhaps she has some underlying gastroparesis as well (states she has not eaten in days). I could not clear her duodenum or stomach however nothing overtly abnormal on CT.  I am going to continue her protonix 40mg  BID, and will add liquid carafate 10cc every 6 hours which I think will help. Carafate may make her stools dark. I will also add Reglan 5mg  every 8 hours, she does endorse some early satiety and suspect could have some underlying gastroparesis for which she will need GES as outpatient.  She does need a repeat EGD, question is when. She is not actively bleeding and again no blood noted anywhere that I could see. If she feels better tomorrow AM on this regimen we can send her home on PPI, carafate, and Reglan and repeat an EGD in 6 weeks or so in the office. Alternatively we could try to repeat an EGD again tomorrow after a few doses of Reglan if there is space available, if she continues to feel poorly. We will reassess her in the AM to discuss plan. Would try clear liquid diet for now and NPO after MN. She agreed. Call with questions.  , MD [facility]

## 2021-09-11 NOTE — Transfer of Care (Signed)
Immediate Anesthesia Transfer of Care Note  Patient: Anayah S Bazzle  Procedure(s) Performed: ESOPHAGOGASTRODUODENOSCOPY (EGD) WITH PROPOFOL  Patient Location: PACU  Anesthesia Type:MAC  Level of Consciousness: awake, drowsy, patient cooperative and responds to stimulation  Airway & Oxygen Therapy: Patient Spontanous Breathing and Patient connected to nasal cannula oxygen  Post-op Assessment: Report given to RN, Post -op Vital signs reviewed and stable and Patient moving all extremities X 4  Post vital signs: Reviewed and stable  Last Vitals:  Vitals Value Taken Time  BP    Temp    Pulse    Resp    SpO2      Last Pain:  Vitals:   09/11/21 1301  TempSrc: Temporal  PainSc: 7       Patients Stated Pain Goal: 2 (57/89/78 4784)  Complications: No notable events documented.

## 2021-09-11 NOTE — Progress Notes (Signed)
°  Transition of Care [facility]) Screening Note   Patient Details  Name: Shelby Hatfield Date of Birth: Sep 23, 1976   Transition of Care [facility]) CM/SW Contact:    Mearl Latin, LCSW Phone Number: 09/11/2021, 9:40 AM    Transition of Care Department [facility]) has reviewed patient. We will continue to monitor patient advancement through interdisciplinary progression rounds. Patient is without insurance. If new patient transition needs arise, please place a TOC consult.

## 2021-09-11 NOTE — Op Note (Signed)
[facility] Patient Name: Shelby Hatfield Procedure Date : 09/11/2021 MRN: [id number] Attending MD: Willaim Rayas. Adela Lank , MD Date of Birth: 1976-01-04 CSN: [id number] Age: 45 Admit Type: Inpatient Procedure:                Upper GI endoscopy Indications:              Epigastric abdominal pain, reported melena -                            relatively normal Hgb - no bleeding symptoms since                            admission, ongoing epigastric discomfort Providers:                Willaim Rayas. Adela Lank, MD, Brooke Person, Salley Scarlet, Technician Referring MD:              Medicines:                Monitored Anesthesia Care Complications:            No immediate complications. Estimated blood loss:                            None. Estimated Blood Loss:     Estimated blood loss: none. Procedure:                Pre-Anesthesia Assessment:                           - Prior to the procedure, a History and Physical                            was performed, and patient medications and                            allergies were reviewed. The patient's tolerance of                            previous anesthesia was also reviewed. The risks                            and benefits of the procedure and the sedation                            options and risks were discussed with the patient.                            All questions were answered, and informed consent                            was obtained. Prior Anticoagulants: The patient has                            taken  no previous anticoagulant or antiplatelet                            agents. ASA Grade Assessment: III - A patient with                            severe systemic disease. After reviewing the risks                            and benefits, the patient was deemed in                            satisfactory condition to undergo the procedure.                           After obtaining  informed consent, the endoscope was                            passed under direct vision. Throughout the                            procedure, the patient's blood pressure, pulse, and                            oxygen saturations were monitored continuously. The                            [id number]-H190 ([id number]) Olympus endoscope was introduced                            through the mouth, and advanced to the antrum of                            the stomach. The upper GI endoscopy was                            accomplished without difficulty. The patient                            tolerated the procedure well. Scope In: Scope Out: Findings:      A suspected small hiatal hernia was present. Time not taken to evaluate       this due to food in the stomach      Moderate to severe esophagitis was found in the distal esophagus without       high risk stigmata for bleeding.      The exam of the esophagus was otherwise normal although the exam was       quite short due to retained food in the stomach.      A large amount of food (residue) was found in the gastric fundus and in       the gastric body prohibiting evaluation of the stomach and duodenum due       to aspiration risk. The procedure was aborted when this was observed but       no blood in the  stomach.      The pylorus appeared patent but duodenum not intubated due to aspiration       risk. Of what was visualized of the stomach, it appeared normal. Impression:               - Small hiatal hernia.                           - Moderate to severe esophagitis which I suspect is                            causing symptoms / prior bleeding.                           - A large amount of food (residue) in the stomach,                            could not evaluate the stomach or duodenum but no                            obvious outlet obstruction - procedure aborted due                            to aspiration risk with food in the  stomach Recommendation:           - Return patient to hospital ward for ongoing care.                           - Clear liquid diet.                           - Continue present medications - protonix twice                            daily                           - Add liquid carafate 10cc po q 6 hours PRN                           - Recommend trial of Reglan 5mg  every 8 hours                            (patient may need evaluation for underlying                            gastroparesis when outpatient)                           - No further bleeding since admission and stable                            Hgb - esophagitis could certainly cause her  symptoms although complete exam not performed due                            to residual food in the stomach. If she is feeling                            better can continue BID PPI and carafate / Reglan                            PRN and repeat EGD as outpatient to confirm mucosal                            healing of esophagitis, versus repeating EGD while                            inpatient to clear her upper tract. Will discuss                            options with patient. Procedure Code(s):        --- Professional ---                           [phone], 52, Esophagogastroduodenoscopy, flexible,                            transoral; diagnostic, including collection of                            specimen(s) by brushing or washing, when performed                            (separate procedure) Diagnosis Code(s):        --- Professional ---                           K44.9, Diaphragmatic hernia without obstruction or                            gangrene                           K20.90, Esophagitis, unspecified without bleeding                           R10.13, Epigastric pain                           K92.1, Melena (includes Hematochezia) CPT copyright 2019 [employer]. All rights reserved. The  codes documented in this report are preliminary and upon coder review may  be revised to meet current compliance requirements. Viviann Spare P. Lopez Dentinger, MD 09/11/2021 2:00:02 PM This report has been signed electronically. Number of Addenda: 0

## 2021-09-11 NOTE — Plan of Care (Signed)

## 2021-09-11 NOTE — Progress Notes (Signed)
Subjective:  Overnight Events: None   Patient was seen and examined on rounds. She describes epigastric abdominal pain that radiates to the RUQ and around to her back. She denies any vomiting but is experiencing nausea.  Denies hematemesis, has not had a bowel movement.  Discussed plan for EGD 1330.  Objective:  Vital signs in last 24 hours: Vitals:   09/11/21 0000 09/11/21 0014 09/11/21 0032 09/11/21 0302  BP: 120/73  (!) 157/88 130/79  Pulse: 62  (!) 58 (!) 50  Resp: 16  18 20   Temp:  98.8 F (37.1 C) 98.7 F (37.1 C) (!) 97.5 F (36.4 C)  TempSrc:  Oral Oral Axillary  SpO2: 96%  98% 94%  Weight:      Height:         Intake/Output Summary (Last 24 hours) at 09/11/2021 0756 Last data filed at 09/10/2021 2219 Gross per 24 hour  Intake 200 ml  Output --  Net 200 ml    Physical Exam: General: Well appearing Caucasian female, NAD HENT: normocephalic, atraumatic EYES: conjunctiva non-erythematous, no scleral icterus CV: regular rate, normal rhythm, no murmurs, rubs, gallops. Pulmonary: normal work of breathing on RA, lungs clear to auscultation, no rales, wheezes, rhonchi Abdominal: non-distended, soft, non-tender to palpation, normal BS Skin: Warm and dry, no rashes or lesions Neurological: MS: awake, alert and oriented x3, normal speech and fund of knowledge Motor: moves all extremities antigravity Psych: normal affect   Assessment/Plan:  Principal Problem:   GI bleed Active Problems:   Epigastric pain  #UGIB 2/2 severe esophagitis EGD completed 12/15 which showed severe esophagitis which GI suspects is the cause of her symptoms and prior bleeding.  Unfortunately the EGD had to be aborted due to food in the stomach.  No sign of bleeding.  Hemoglobin stable 11-12. GI concern patient may also have some underlying gastroparesis given food still in stomach reports has not eaten in days.  Patient will continue on Protonix with addition of Carafate and Reglan.   Patient will need GES as outpatient.  Patient will need repeat EGD, either tomorrow if patient still feeling poorly and schedules open, or in 6 weeks outpatient. 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  INR normal.  Question if splenomegaly is related to portal hypertension. She denies a significant alcohol use history.  Possible hepatitis C exposure in the past.  Initial concern for SBP given possible variceal bleeding as cause of UGIB.  No evidence of esophageal varices.  No need for antibiotics for SBP prophylaxis at this time. Plan: -Follow-up: Hep C RNA quant, autoimmune work-up per GI   #Asymptomatic COVID positive  Continue Paxlovid.  Mains asymptomatic  #Asymptomatic Bacteruria Patient denies urinary symptoms.  Positive nitrates, moderate leukocytes, many bacteria, greater than 50 WBCs, urine culture with >100,000 gram-negative rods.  Susceptibilities to follow. Plan: -Ceftriaxone, plan to switch to p.o. if patient discharges tomorrow  #Hypokalemia Likely secondary to GI fluid loss.  Was repleted with IV potassium.  Patient does not tolerate IV potassium well, plan for future p.o. potassium repletion if patient is able to tolerate.  Diet: CLD, n.p.o. at midnight VTE: No pharmacologic DVT prophylaxis in the setting of GI bleed IVF: None Code: Full  Prior to Admission Living Arrangement: Home Anticipated Discharge Location: Home Barriers to Discharge: Improvement in clinical status Dispo: Anticipated discharge in approximately 1-2 day(s).   Portions of this  report may have been transcribed using voice recognition software. Every effort was made to ensure accuracy; however, inadvertent computerized transcription errors may be present.   Ellison Carwin, MD 09/11/21,  7:56 AM Pager: [phone] Internal Medicine Resident, PGY-1 [facility]

## 2021-09-11 NOTE — Interval H&P Note (Signed)
History and Physical Interval Note: Patient has not had any further bleeding overnight. Hgb stable. Continues to have some epigastric pain. INR normal. I have discussed risks / benefits of EGD And anesthesia and she wishes to proceed. Further recommendations pending the results. She agrees. Otherwise denies cardiopulmonary symptoms related to her COVID.    09/11/2021 1:30 PM  Bayla S Cando  has presented today for surgery, with the diagnosis of melena, history of paraesophageal repair, cirrhosis.  The various methods of treatment have been discussed with the patient and family. After consideration of risks, benefits and other options for treatment, the patient has consented to  Procedure(s): ESOPHAGOGASTRODUODENOSCOPY (EGD) WITH PROPOFOL (N/A) as a surgical intervention.  The patient's history has been reviewed, patient examined, no change in status, stable for surgery.  I have reviewed the patient's chart and labs.  Questions were answered to the patient's satisfaction.     Viviann Spare P Mayan Kloepfer

## 2021-09-12 ENCOUNTER — Telehealth: Payer: Self-pay

## 2021-09-12 ENCOUNTER — Other Ambulatory Visit (HOSPITAL_COMMUNITY): Payer: Self-pay

## 2021-09-12 DIAGNOSIS — K922 Gastrointestinal hemorrhage, unspecified: Secondary | ICD-10-CM

## 2021-09-12 DIAGNOSIS — K209 Esophagitis, unspecified without bleeding: Secondary | ICD-10-CM

## 2021-09-12 DIAGNOSIS — K921 Melena: Secondary | ICD-10-CM

## 2021-09-12 LAB — CBC
HCT: 33.4 % — ABNORMAL LOW (ref 36.0–46.0)
Hemoglobin: 11.2 g/dL — ABNORMAL LOW (ref 12.0–15.0)
MCH: 27 pg (ref 26.0–34.0)
MCHC: 33.5 g/dL (ref 30.0–36.0)
MCV: 80.5 fL (ref 80.0–100.0)
Platelets: 237 10*3/uL (ref 150–400)
RBC: 4.15 MIL/uL (ref 3.87–5.11)
RDW: 13.2 % (ref 11.5–15.5)
WBC: 8.7 10*3/uL (ref 4.0–10.5)
nRBC: 0 % (ref 0.0–0.2)

## 2021-09-12 LAB — IGG: IgG (Immunoglobin G), Serum: 889 mg/dL (ref 586–1602)

## 2021-09-12 LAB — URINE CULTURE: Culture: >=100000 — AB

## 2021-09-12 LAB — ANA W/REFLEX IF POSITIVE: Anti Nuclear Antibody (ANA): NEGATIVE

## 2021-09-12 LAB — MAGNESIUM: Magnesium: 1.9 mg/dL (ref 1.7–2.4)

## 2021-09-12 LAB — CERULOPLASMIN: Ceruloplasmin: 25.8 mg/dL (ref 19.0–39.0)

## 2021-09-12 LAB — COMPREHENSIVE METABOLIC PANEL
ALT: 35 U/L (ref 0–44)
AST: 35 U/L (ref 15–41)
Albumin: 2.6 g/dL — ABNORMAL LOW (ref 3.5–5.0)
Alkaline Phosphatase: 106 U/L (ref 38–126)
Anion gap: 7 (ref 5–15)
BUN: 9 mg/dL (ref 6–20)
CO2: 21 mmol/L — ABNORMAL LOW (ref 22–32)
Calcium: 8.9 mg/dL (ref 8.9–10.3)
Chloride: 106 mmol/L (ref 98–111)
Creatinine, Ser: 0.74 mg/dL (ref 0.44–1.00)
GFR, Estimated: >60 mL/min (ref >60)
Glucose, Bld: 104 mg/dL — ABNORMAL HIGH (ref 70–99)
Potassium: 3.3 mmol/L — ABNORMAL LOW (ref 3.5–5.1)
Sodium: 134 mmol/L — ABNORMAL LOW (ref 135–145)
Total Bilirubin: 0.5 mg/dL (ref 0.3–1.2)
Total Protein: 5.3 g/dL — ABNORMAL LOW (ref 6.5–8.1)

## 2021-09-12 LAB — ALPHA-1-ANTITRYPSIN: A-1 Antitrypsin, Ser: 160 mg/dL (ref 101–187)

## 2021-09-12 LAB — HEPATITIS B SURFACE ANTIBODY, QUANTITATIVE: Hep B S AB Quant (Post): <3.1 m[IU]/mL — ABNORMAL LOW (ref >9.9)

## 2021-09-12 MED ORDER — OXYCODONE-ACETAMINOPHEN 5-325 MG PO TABS: 1.0000 | ORAL_TABLET | ORAL | Status: DC | PRN

## 2021-09-12 MED ORDER — METOCLOPRAMIDE HCL 5 MG PO TABS: 5.0000 mg | ORAL_TABLET | Freq: Three times a day (TID) | ORAL | 0 refills | Status: DC

## 2021-09-12 MED ORDER — POTASSIUM CHLORIDE 20 MEQ PO PACK
40.0000 meq | PACK | Freq: Four times a day (QID) | ORAL | Status: DC
  Administered 2021-09-12: 40 meq via ORAL
  Filled 2021-09-12: qty 2

## 2021-09-12 MED ORDER — TRAMADOL HCL 50 MG PO TABS: 50.0000 mg | ORAL_TABLET | Freq: Four times a day (QID) | ORAL | 0 refills | Status: DC | PRN

## 2021-09-12 MED ORDER — TRAMADOL HCL 50 MG PO TABS
50.0000 mg | ORAL_TABLET | Freq: Four times a day (QID) | ORAL | 0 refills | Status: AC | PRN
  Filled 2021-09-12: qty 12, 3d supply, fill #0

## 2021-09-12 MED ORDER — PANTOPRAZOLE SODIUM 40 MG PO TBEC: 40.0000 mg | DELAYED_RELEASE_TABLET | Freq: Two times a day (BID) | ORAL | 0 refills | Status: DC

## 2021-09-12 MED ORDER — ONDANSETRON HCL 4 MG PO TABS: 4.0000 mg | ORAL_TABLET | Freq: Four times a day (QID) | ORAL | 0 refills | Status: AC | PRN

## 2021-09-12 MED ORDER — SUCRALFATE 1 GM/10ML PO SUSP: 1.0000 g | Freq: Four times a day (QID) | ORAL | 0 refills | Status: DC

## 2021-09-12 MED ORDER — TRAMADOL HCL 50 MG PO TABS: 50.0000 mg | ORAL_TABLET | Freq: Four times a day (QID) | ORAL | Status: DC | PRN

## 2021-09-12 NOTE — Plan of Care (Signed)
Patient reminded to be on NPO at 0000.   Problem: Clinical Measurements: Goal: Respiratory complications will improve Outcome: Not Progressing   Problem: Activity: Goal: Risk for activity intolerance will decrease Outcome: Not Progressing   Problem: Nutrition: Goal: Adequate nutrition will be maintained Outcome: Not Progressing   Problem: Pain Managment: Goal: General experience of comfort will improve Outcome: Not Progressing

## 2021-09-12 NOTE — TOC Progression Note (Signed)
Transition of Care [facility]) - Progression Note    Patient Details  Name: Shelby Hatfield MRN: [id number] Date of Birth: 04-15-1976  [facility] CM/SW Contact  Beckie Busing, RN Phone [phone]  09/12/2021, 4:14 PM  Clinical Narrative:    MATCH completed to provide uninsured patient with 30 day supply of meds.        Expected Discharge Plan and Services           Expected Discharge Date: 09/12/21                                     Social Determinants of Health (SDOH) Interventions    Readmission Risk Interventions No flowsheet data found.

## 2021-09-12 NOTE — Telephone Encounter (Signed)
Lm on vm for patient to return call to discuss appointments.   Patient has been scheduled for a 80-month follow up with Dr. Adela Lank on Thursday, 10/16/21 at 3:20 pm.   Patient has been scheduled for an EGD in the [facility] on Wednesday, 10/29/21 at 10 am. Pt will need to arrive at 9 am with a care partner.  Ambulatory referral to GI in epic.

## 2021-09-12 NOTE — Progress Notes (Addendum)
Patient's potassium is 3.3, discussed with Dr. Leafy Half as patient refuses any IV potassium infusion, per latter, will defer correction for now and wait till Laurette Schimke clears diet post possible EGD today.  Arthor Captain MD updated about patient's potassium, magnesium added to earlier collected blood.

## 2021-09-12 NOTE — Discharge Summary (Signed)
Name: Shelby Hatfield MRN: [id number] DOB: March 03, 1976 45 y.o. PCP: Patient, No Pcp Per (Inactive)  Date of Admission: 09/09/2021  9:39 PM Date of Discharge: 09/12/2021 Attending Physician: Aldine Contes, MD  Discharge Diagnosis: Upper GI bleed Moderate to severe esophagitis Liver fibrosis on imaging Hepatitis C antibody positive Asymptomatic COVID positive  Asymptomatic Bacteruria Hypokalemia  Discharge Medications: Allergies as of 46/16/2022       Reactions   Lidocaine Viscous Hcl Other (See Comments)   Created BLISTERS in the mouth   Penicillins Itching, Rash   Has patient had a PCN reaction causing immediate rash, facial/tongue/throat swelling, SOB or lightheadedness with hypotension: Yes Has patient had a PCN reaction causing severe rash involving mucus membranes or skin necrosis: No Has patient had a PCN reaction that required hospitalization No Has patient had a PCN reaction occurring within the last 10 years: No If all of the above answers are "NO", then may proceed with Cephalosporin use.        Medication List     STOP taking these medications    acetaminophen 500 MG tablet Commonly known as: TYLENOL   sucralfate 1 g tablet Commonly known as: CARAFATE Replaced by: sucralfate 1 GM/10ML suspension       TAKE these medications    metoCLOPramide 5 MG tablet Commonly known as: Reglan Take 1 tablet (5 mg total) by mouth 3 (three) times daily before meals.   ondansetron 4 MG tablet Commonly known as: ZOFRAN Take 1 tablet (4 mg total) by mouth every 6 (six) hours as needed for up to 3 days for nausea or vomiting.   pantoprazole 40 MG tablet Commonly known as: PROTONIX Take 1 tablet (40 mg total) by mouth 2 (two) times daily. What changed: when to take this   sucralfate 1 GM/10ML suspension Commonly known as: CARAFATE Take 10 mLs (1 g total) by mouth every 6 (six) hours. Replaces: sucralfate 1 g tablet   traMADol 50 MG tablet Commonly known  as: ULTRAM Take 1 tablet (50 mg total) by mouth every 6 (six) hours as needed for up to 3 days for severe pain.        Disposition and follow-up:   Shelby Hatfield was discharged from [facility] in Stable condition.  At the hospital follow up visit please address:  UGIB 2/2 severe esophagitis: Discharged on 30-day supply of Protonix 40 mg twice daily, Carafate solution 10 days worth, patient reports she has Carafate tablets at home which she can put in water to create a slurry, Reglan 5 mg 3 times daily.  Patient also supplied with 3-day supply of Zofran as needed and tramadol as needed.  Follow-up appointment with GI January 19th.  Repeat EGD planned for early February.  Please ensure patient attends GI appointments, obtain CMP and CBC to ensure stable liver function and stable hemoglobin.  Liver fibrosis incidentally noted on abdominal CT: Concern for liver cirrhosis.  Hepatitis C AB+.  Hepatitis C RNA quant pending.  Autoimmune antibodies pending.  Labs / imaging needed at time of follow-up: CMP, CBC  Pending labs/ test needing follow-up: Hepatitis C RNA quant, antimitochondrial antibodies, anti-smooth muscle antibody  Follow-up Appointments:  Follow-up Information     [facility] Follow up.   Specialty: Gastroenterology Why: Appointment date and time: January 19th 3:20 PM Contact information: [place] [phone] [place] Hospital Course by Problem List:  Ms. Shelby Hatfield is a 45 year old Caucasian female with past medical history of hiatal hernia repair x2, history of T2DM, GERD, history of opiate addiction and benzodiazepine dependence, who presented to [facility] ED 12/13 for melena and hematemesis, and was found to have moderate to severe esophagitis on EGD.  #UGIB 2/2 severe esophagitis Patient presented with nausea/vomiting, abdominal pain, hematemesis, melena.  Concern for UGIB, GI  consulted.  Patient was started on ceftriaxone for SBP prophylaxis given concern for variceal bleeding. EGD completed 12/15 which showed moderate to severe esophagitis which GI suspects is the cause of her symptoms and prior bleeding.  Unfortunately the EGD had to be aborted due to food in the stomach.  No further signs of bleeding following EGD. Hemoglobin stable 11-12. GI also concerned patient may have gastroparesis given food still in stomach despite patient reports that she has not eaten in days, alternatively patient's S/O may have brought patient food during n.p.o. time period.  Patient will continue on Protonix and Carafate (patient prefers liquid to tablet) and addition of Reglan.  Patient tolerating p.o. diet at discharge.  Will discharge on Protonix 40 mg twice daily, liquid Carafate 10 cc Q6 HR, Reglan 5 mg 3 times daily before meals.  GI counseled patient on risks of Reglan.  She was also discharged with 3 days of Zofran and tramadol for pain and nausea.  Patient may need GES as outpatient. Patient will need repeat EGD in 6 weeks.  GI outpatient appointment scheduled for January 19th.  Repeat EGD scheduled for early February.  Patient was encouraged to establish care with PCP as well.  #Liver fibrosis incidentally noted on abdominal CT #Splenomegaly On admission platelets were normal, albumin mildly low at 3.3, INR normal.  Abdominal CT notable for somewhat coarse, nodular contour of the liver suggestive of cirrhosis with new splenomegaly maximum coronal span 14.5 cm.  Wondering if splenomegaly is related to portal hypertension. She denies a significant alcohol use history.  Possible hepatitis C exposure in the past.  Initial concern for SBP given possible variceal bleeding as cause of UGIB.  No evidence of esophageal varices.  No need for antibiotics for SBP prophylaxis at this time.  Patient tested positive for hepatitis C antibody.  Hep C RNA quantitative pending.  Patient also tested for  autoimmune etiologies, some lab work still pending.  ANA negative, alpha-1 antitrypsin wnl, ceruloplasmin WNL, IgG WNL.  Patient will need to follow-up with GI to discuss lab results.  If patient has high hepatitis C RNA viral load, patient will need to start antiretroviral therapy with ID.  #Asymptomatic COVID positive  Incidental COVID-positive test. Started on paxlovid.  Remained asymptomatic.  #Asymptomatic Bacteruria Patient denies urinary symptoms.  Positive nitrates, moderate leukocytes, many bacteria, greater than 50 WBCs, urine culture with >100,000 gram-negative rods.  Patient denied symptoms of UTI and therefore antibiotics were not started.  #Hypokalemia Likely secondary to GI fluid loss.  Was repleted with IV potassium.  Patient does not tolerate IV potassium well, plan for future p.o. potassium repletion if patient is able to tolerate.  Subjective on day of discharge: Patient reports improvement in burning abdominal pain.  Intermittently nauseous, though no vomiting.  Had a bowel movement that was nonbloody.  Feels well enough to try soft foods today.  Denies symptoms of UTI. Discussed GIs plan for the day. Patient amenable to discharge today.  Discharge Exam:   BP 118/83 (BP Location: Left Arm)    Pulse (!) 55    Temp 98  F (36.7 C) (Axillary)    Resp 11    Ht 5\' 1"  (1.549 m)    Wt 59.9 kg    LMP 07/12/2021    SpO2 100%    BMI 24.95 kg/m  Discharge exam: General: Well appearing Caucasian female, NAD HENT: normocephalic, atraumatic EYES: conjunctiva non-erythematous, no scleral icterus CV: regular rate, normal rhythm, no murmurs, rubs, gallops. Pulmonary: normal work of breathing on RA, lungs clear to auscultation, no rales, wheezes, rhonchi Abdominal: non-distended, soft, mild epigastric and RUQ tenderness to palpation, normal BS Skin: Warm and dry, no rashes or lesions Neurological: MS: awake, alert and oriented x3, normal speech and fund of knowledge Motor: moves all  extremities antigravity Psych: normal affect   Pertinent Labs, Studies, and Procedures:  CBC Latest Ref Rng & Units 09/12/2021 09/11/2021 09/10/2021  WBC 4.0 - 10.5 K/uL 8.7 8.1 9.3  Hemoglobin 12.0 - 15.0 g/dL 11.2(L) 11.5(L) 12.1  Hematocrit 36.0 - 46.0 % 33.4(L) 32.6(L) 35.8(L)  Platelets 150 - 400 K/uL 237 197 218   CMP Latest Ref Rng & Units 09/12/2021 09/11/2021 09/09/2021  Glucose 70 - 99 mg/dL 09/11/2021) 90 99  BUN 6 - 20 mg/dL 9 9 12   Creatinine 0.44 - 1.00 mg/dL 532(D 9.24  Sodium 135 - 145 mmol/L 134(L) 136 136  Potassium 3.5 - 5.1 mmol/L 3.3(L) 3.7 3.2(L)  Chloride 98 - 111 mmol/L 106 108 101  CO2 22 - 32 mmol/L 21(L) 22 26  Calcium 8.9 - 10.3 mg/dL 8.9 2.68) 9.4  Total Protein 6.5 - 8.1 g/dL 5.3(L) 5.6(L) 6.6  Total Bilirubin 0.3 - 1.2 mg/dL 0.5 0.6 0.8  Alkaline Phos 38 - 126 U/L 106 115 161(H)  AST 15 - 41 U/L 35 29 34  ALT 0 - 44 U/L 35 34 48(H)    CT Abdomen Pelvis W Contrast  Result Date: 09/10/2021 CLINICAL DATA:  Epigastric pain, black stools, nausea, vomiting, fevers EXAM: CT ABDOMEN AND PELVIS WITH CONTRAST TECHNIQUE: Multidetector CT imaging of the abdomen and pelvis was performed using the standard protocol following bolus administration of intravenous contrast. CONTRAST:  9.6(Q OMNIPAQUE IOHEXOL 300 MG/ML  SOLN COMPARISON:  09/07/2018 FINDINGS: Lower chest: No acute abnormality. Hepatobiliary: No focal liver abnormality is seen. Somewhat coarse, nodular contour of the liver. Status post cholecystectomy. No biliary dilatation. Pancreas: Unremarkable. No pancreatic ductal dilatation or surrounding inflammatory changes. Spleen: New splenomegaly, maximum coronal span 14.5 cm. Adrenals/Urinary Tract: Adrenal glands are unremarkable. Kidneys are normal, without renal calculi, solid lesion, or hydronephrosis. Bladder is unremarkable. Stomach/Bowel: Prior hiatal hernia repair. Appendix is not clearly visualized and may be surgically absent no evidence of bowel wall  thickening, distention, or inflammatory changes. Vascular/Lymphatic: No significant vascular findings are present. No enlarged abdominal or pelvic lymph nodes. Reproductive: No mass or other significant abnormality. Other: Fat containing umbilical hernia.  No abdominopelvic ascites. Musculoskeletal: No acute or significant osseous findings. IMPRESSION: 1. No acute CT findings of the abdomen or pelvis to explain epigastric pain, nausea, or black stools. 2. Somewhat coarse, nodular contour of the liver, suggestive of cirrhosis. 3. New splenomegaly, maximum coronal span 14.5 cm. Electronically Signed   By: M.D.   On: 09/10/2021 11:23   DG Chest Portable 1 View  Result Date: 09/10/2021 CLINICAL DATA:  Pain, diarrhea, emesis EXAM: PORTABLE CHEST 1 VIEW COMPARISON:  07/24/2017 FINDINGS: The heart size and mediastinal contours are within normal limits. Both lungs are clear. The visualized skeletal structures are unremarkable. IMPRESSION: No acute abnormality of the lungs  in AP portable projection. Electronically Signed   By: Delanna Ahmadi M.D.   On: 09/10/2021 10:33    Discharge Instructions: Discharge Instructions     Call MD for:  persistant nausea and vomiting   Complete by: As directed    Call MD for:  temperature >100.4   Complete by: As directed    Discharge instructions   Complete by: As directed    You were hospitalized for esophagitis (inflammation of the esophagus) which caused upper gastrointestinal bleeding.   We increased your Protonix to 400 mg twice daily.  We gave you 3 days worth of pain medication (tramadol) and nausea medication (Zofran).  We had pharmacy bring you 10 days worth of Carafate solution.  Okay to use your tablets of Carafate at home and mix them with water to create a slurry if this works better for you.  We also ordered a 30-day supply of Reglan which helps with getting food moving through your stomach.   Please follow-up with [facility] on  January 19 at 3:20 PM.  Your second endoscopy is scheduled for February 1 at 10 AM.  We have only given you a months worth of your medications, you will need to see your gastroenterologist for refills of these medications.  Please establish care with a primary care physician.  "Community health and wellness" is an option for patients were uninsured.  Thank you for allowing [place] to be part of your care.       Portions of this report may have been transcribed using voice recognition software. Every effort was made to ensure accuracy; however, inadvertent computerized transcription errors may be present.   Wayland Denis, MD 09/12/21,  3:59 PM Pager: [phone] Internal Medicine Resident, PGY-1 [facility]

## 2021-09-12 NOTE — Telephone Encounter (Signed)
-----   Message from Benancio Deeds, MD sent at 09/12/2021  8:51 AM EST ----- Regarding: outpatient follow up [facility], Can you help coordinate outpatient EGD for this patient with me in 6-8 weeks? She will be discharged later today. Otherwise needs an appointment with me or APP in 1 month or so in the office as well. Thanks  Dr. Mervyn Skeeters

## 2021-09-12 NOTE — Progress Notes (Signed)
Progress Note   Subjective  Patient feeling okay today. States the "burning" is much better after her carafate. She wants to try more PO. No bleeding symptoms, stable Hgb.   Objective   Vital signs in last 24 hours: Temp:  [97.6 F (36.4 C)-98.8 F (37.1 C)] 98 F (36.7 C) (12/16 0817) Pulse Rate:  [46-66] 55 (12/16 0817) Resp:  [11-19] 11 (12/16 0817) BP: (118-150)/(73-88) 118/83 (12/16 0817) SpO2:  [97 %-100 %] 100 % (12/16 0817) Weight:  [59.9 kg] 59.9 kg (12/15 1301) Last BM Date: 09/10/21 General:    female in NAD Abdomen:  Soft, nontender and nondistended. Normal bowel sounds. Neurologic:  Alert and oriented,  grossly normal neurologically. Psych:  Cooperative. Normal mood and affect.  Intake/Output from previous day: 12/15 0701 - 12/16 0700 In: 654.7 [P.O.:237; I.V.:317.7; IV Piggyback:100] Out: -  Intake/Output this shift: No intake/output data recorded.  Lab Results: Recent Labs    09/10/21 1007 09/11/21 0515 09/12/21 0048  WBC 9.3 8.1 8.7  HGB 12.1 11.5* 11.2*  HCT 35.8* 32.6* 33.4*  PLT 218 197 237   BMET Recent Labs    09/09/21 2242 09/11/21 0515 09/12/21 0048  NA 136 136 134*  K 3.2* 3.7 3.3*  CL 101 108 106  CO2 26 22 21*  GLUCOSE 99 90 104*  BUN 12 9 9   CREATININE 0.67 0.66 0.74  CALCIUM 9.4 8.8* 8.9   LFT Recent Labs    09/12/21 0048  PROT 5.3*  ALBUMIN 2.6*  AST 35  ALT 35  ALKPHOS 106  BILITOT 0.5   PT/INR Recent Labs    09/10/21 2005  LABPROT 13.2  INR 1.0    Studies/Results: CT Abdomen Pelvis W Contrast  Result Date: 09/10/2021 CLINICAL DATA:  Epigastric pain, black stools, nausea, vomiting, fevers EXAM: CT ABDOMEN AND PELVIS WITH CONTRAST TECHNIQUE: Multidetector CT imaging of the abdomen and pelvis was performed using the standard protocol following bolus administration of intravenous contrast. CONTRAST:  09/12/2021 OMNIPAQUE IOHEXOL 300 MG/ML  SOLN COMPARISON:  09/07/2018 FINDINGS: Lower chest: No acute  abnormality. Hepatobiliary: No focal liver abnormality is seen. Somewhat coarse, nodular contour of the liver. Status post cholecystectomy. No biliary dilatation. Pancreas: Unremarkable. No pancreatic ductal dilatation or surrounding inflammatory changes. Spleen: New splenomegaly, maximum coronal span 14.5 cm. Adrenals/Urinary Tract: Adrenal glands are unremarkable. Kidneys are normal, without renal calculi, solid lesion, or hydronephrosis. Bladder is unremarkable. Stomach/Bowel: Prior hiatal hernia repair. Appendix is not clearly visualized and may be surgically absent no evidence of bowel wall thickening, distention, or inflammatory changes. Vascular/Lymphatic: No significant vascular findings are present. No enlarged abdominal or pelvic lymph nodes. Reproductive: No mass or other significant abnormality. Other: Fat containing umbilical hernia.  No abdominopelvic ascites. Musculoskeletal: No acute or significant osseous findings. IMPRESSION: 1. No acute CT findings of the abdomen or pelvis to explain epigastric pain, nausea, or black stools. 2. Somewhat coarse, nodular contour of the liver, suggestive of cirrhosis. 3. New splenomegaly, maximum coronal span 14.5 cm. Electronically Signed   By: 14/07/2018 M.D.   On: 09/10/2021 11:23   DG Chest Portable 1 View  Result Date: 09/10/2021 CLINICAL DATA:  Pain, diarrhea, emesis EXAM: PORTABLE CHEST 1 VIEW COMPARISON:  07/24/2017 FINDINGS: The heart size and mediastinal contours are within normal limits. Both lungs are clear. The visualized skeletal structures are unremarkable. IMPRESSION: No acute abnormality of the lungs in AP portable projection. Electronically Signed   By: 07/26/2017 M.D.   On: 09/10/2021  10:33       Assessment / Plan:   45 y/o female with history of hiatal hernia repair x 2, history of DM, who presented with subjective melena and epigastric pain, heme (+) stool, normal Hgb. Incidentally also noted to have possible cirrhosis on CT scan,  denies EtOH.  EGD yesterday done showing moderate to severe esophagitis, but full exam not done given residual food in her stomach. Her pylorus seemed patent but did not evaluate duodenum due to aspiration risk from retained food. That being said no heme noted anywhere, she has not had a bowel movement since admission and her Hgb is stable, she is not bleeding. Esophagitis could certainly be causing all of these symptoms, perhaps she also has underlying gastroparesis. She has been on BID PPI and liquid carafate added yesterday as well as Reglan and she is feeling better this AM. She has not had any bleeding at all since being in the hospital.  I discussed options with the patient, discharge home on this medication regimen with follow up EGD with me in 6-8 weeks or so, or do another EGD as inpatient to clear her upper tract. Given she is feeling better on the medications, she would prefer to go home and complete her evaluation as an outpatient which I think is reasonable.   Of note, she has suggestion of cirrhosis on imaging but her LAEs relatively normal, platelets normal, and INR normal. If she has cirrhosis she is compensated. Labs for screening for chronic liver diseases initially showing hepatitis C AB (+), thus hep C RNA sent and pending to confirm if she has hep C. If so she will need to be treated for this as outpatient.  Plan: - resume diet, will start full liquids, patient would prefer that for now - okay for discharge home today - continue protonix 40mg  BID - continue liquid carafate 10cc every 6 hours at home. If she cannot afford the liquid regimen okay to use tablets and dissolve in a small amount of water to make a slurry. Please note this will make her stools dark - continue Reglan 5mg  TID prior to meals. I have counseled patient on risks of Reglan, at low dose risks for side effects is minimal. - await hep C RNA, will need referral to ID for treatment if positive - our office will  contact her to coordinate follow up in upcoming weeks and schedule an EGD in 6-8 weeks - contact in the interim if any recurrent symptoms  , MD [facility]

## 2021-09-13 LAB — HEPATITIS C GENOTYPE: Hepatitis C Genotype: 3

## 2021-09-13 LAB — HCV RNA QUANT RFLX ULTRA OR GENOTYP
HCV RNA Qnt(log copy/mL): 5.559 log10 IU/mL
HepC Qn: 362000 IU/mL

## 2021-09-14 ENCOUNTER — Encounter (HOSPITAL_COMMUNITY): Payer: Self-pay | Admitting: Gastroenterology

## 2021-09-14 LAB — MITOCHONDRIAL ANTIBODIES: Mitochondrial M2 Ab, IgG: <20 Units (ref 0.0–20.0)

## 2021-09-14 LAB — ANTI-SMOOTH MUSCLE ANTIBODY, IGG: F-Actin IgG: 4 Units (ref 0–19)

## 2021-09-15 NOTE — Telephone Encounter (Signed)
I attempted to reach patient again at her listed phone number, a female answers and states that I have the wrong number.  Attempted to reach pt's significant other and that line rings then goes to a fast busy signal. I was able to reach patient's mother, she will have patient call us back when she returns from the store.

## 2021-09-16 NOTE — Telephone Encounter (Signed)
No return call received from patient. Called and spoke with patient's mother, she states that she left the patient a note to call us back. Pt's mother states that she does not have an alternate phone number for her and she should be back anytime. Will attempt to reach pt at a different time.

## 2021-09-19 ENCOUNTER — Other Ambulatory Visit: Payer: Self-pay

## 2021-09-19 ENCOUNTER — Encounter: Payer: Self-pay | Admitting: Emergency Medicine

## 2021-09-19 ENCOUNTER — Emergency Department
Admission: EM | Admit: 2021-09-19 | Discharge: 2021-09-19 | Disposition: A | Discharge status: Home / Self Care | Payer: Self-pay | Attending: Emergency Medicine | Admitting: Emergency Medicine

## 2021-09-19 DIAGNOSIS — K625 Hemorrhage of anus and rectum: Secondary | ICD-10-CM | POA: Insufficient documentation

## 2021-09-19 DIAGNOSIS — Z5321 Procedure and treatment not carried out due to patient leaving prior to being seen by health care provider: Secondary | ICD-10-CM | POA: Insufficient documentation

## 2021-09-19 NOTE — ED Triage Notes (Signed)
Pt reports rectal bleeding since last pm. Pt states recently admitted at [facility] for the same. Pt reports abd pain and NV as well.

## 2021-09-19 NOTE — Telephone Encounter (Signed)
Attempted to reach pt at listed phone number, a female answers and states that I have the wrong number.  I left a vm for patient's mother to have the patient contact us.

## 2021-09-19 NOTE — ED Provider Notes (Signed)
Emergency Medicine Provider Triage Evaluation Note  Shelby Hatfield , a 45 y.o. female  was evaluated in triage.  Pt complains of abdominal pain and rectal bleeding.  Patient was recently discharged from a [facility] for a GI bleed.  Patient feels like she is worsening.  However the patient is wanting to leave at this time.  Nursing staff has told her that it if will not be to her best interest to leave.  That she could go home and die..  Review of Systems  Positive: Abdominal pain, recent GI bleed, rectal bleeding Negative: Fever, chills, chest pain or shortness of  Physical Exam  There were no vitals taken for this visit. Gen:   Awake, no distress   Resp:  Normal effort  MSK:   Moves extremities without difficulty  Other:  Abdomen tender to palpation right upper quadrant: Lower quadrant bilaterally  Medical Decision Making  Medically screening exam initiated at 2:37 PM.  Appropriate orders placed.  Ilya S Demedeiros was informed that the remainder of the evaluation will be completed by another provider, this initial triage assessment does not replace that evaluation, and the importance of remaining in the ED until their evaluation is complete.     Faythe Ghee, PA-C 09/19/21 1438    Concha Se, MD 09/19/21 [phone]

## 2021-09-19 NOTE — ED Triage Notes (Signed)
Pt refusing labs or other protocols. Pt states is leaving. Provider in room with pt as well. Pt encouraged to stay and educated of the dangers of leaving but pt refuses to stay

## 2021-09-19 NOTE — ED Triage Notes (Signed)
Pt in via EMS from home with c/o abd pain for 3 days. Pt has flu and cpvid in her household so has been exposed to both. Pt also reports blood in stool and hx of the same.

## 2021-09-25 NOTE — Telephone Encounter (Signed)
No return call received. Will mail appt information to patient.

## 2021-09-30 ENCOUNTER — Telehealth: Payer: Self-pay | Admitting: Internal Medicine

## 2021-09-30 ENCOUNTER — Emergency Department (HOSPITAL_COMMUNITY)
Admission: EM | Admit: 2021-09-30 | Discharge: 2021-09-30 | Discharge status: Absent without leave | Payer: Self-pay | Source: Home / Self Care

## 2021-09-30 NOTE — Telephone Encounter (Signed)
Contacted by patient this morning while on call She reports that she is continued to have coffee-ground emesis and now has seen blood in her stool.  Stools have been loose. Generally she feels unwell. Recent hospitalization where she had an upper endoscopy performed by Dr. Adela Lank.  This revealed severe esophagitis felt to explain recent GI bleeding.  I recommended she return to the ER for further management given concern for ongoing GI bleeding. She voiced understanding and thanked me for the call

## 2021-10-16 ENCOUNTER — Ambulatory Visit: Payer: Self-pay | Admitting: Gastroenterology

## 2021-10-17 ENCOUNTER — Telehealth: Payer: Self-pay

## 2021-10-17 NOTE — Telephone Encounter (Signed)
Called patient but the number we have in her chart is not correct.  Called her alternate contact number and it just rings.  Letter mailed to patient asking her to contact us to discuss EGD on 2-1.

## 2021-10-17 NOTE — Telephone Encounter (Signed)
-----   Message from Benancio Deeds, MD sent at 10/17/2021 12:33 PM EST ----- Regarding: RE: needs to be instructed for EGD Thanks Jan. Can you reach out to her to let her know she missed her office appointment. Can you ask her if she plans on follow up with [place] and to her EGD on 2/1? She called in to speak with Dr. Rhea Belton in the past and he told her to go to the ED, but looks like she never did. Can you ask how she is doing? We will only do EGD on 2/1, colonoscopy can be done later. Thanks   ----- Message ----- From: Cooper Render, CMA Sent: 10/16/2021   5:48 PM EST To: Benancio Deeds, MD Subject: needs to be instructed for EGD                 FYI - You saw this patient in the hospital performed EGD ("Epigastric abdominal pain, reported melena - relatively normal Hgb - no bleeding symptoms since admission, ongoing epigastric discomfort") and she was scheduled for an office visit today, which she no-showed.  See telephone note from [place] on 1-3.  It looks like [place] scheduled her for a repeat EGD on 2-1.  Before I call her and arrange to get her instructions for EGD, are you considering added a colonoscopy or just the EGD at this time?  Thanks, Jan

## 2021-10-21 NOTE — Telephone Encounter (Signed)
Fyi - mailed letter to patient.

## 2021-10-29 ENCOUNTER — Encounter: Payer: Self-pay | Admitting: Gastroenterology

## 2021-10-29 ENCOUNTER — Telehealth: Payer: Self-pay | Admitting: Gastroenterology

## 2021-10-29 NOTE — Telephone Encounter (Signed)
Patient was scheduled for a clinic appointment on 1/19 and no showed for post hospital follow up.  She was scheduled for an EGD this AM 2/1, and no showed. Multiple phone calls to the patient and family listed in her chart, have not been able to get ahold of anyone.  [place] can you reach out to this patient / family in the next day or so. If she wishes to receive care after her hospitalization she needs to keep her appointments. She has not yet seen [place] outside of the hospital. If she wishes to pursue further care with [place] she will need an office visit, no further procedures will be scheduled without an office visit first. If you cannot get ahold of anyone please mail a letter. She can contact us to schedule. If she does not make her next appointment she will not be able to establish care with [place] given 2 no-shows already. Thanks

## 2021-10-30 NOTE — Telephone Encounter (Signed)
Called and spoke with patient's mother. She stated that pt was not available. I asked her if she had an alternate contact number for the patient and she responded "If she does, I don't know it". Will attempt to reach pt again at a different time.

## 2021-10-30 NOTE — Telephone Encounter (Signed)
Attempted to reach [facility], his phone line rings for several minutes then goes straight to vm.

## 2021-11-05 NOTE — Telephone Encounter (Signed)
Attempted to reach [place] twice. The line rings for about a minute and then goes to a fast busy signal. Unable to leave a vm. Will mail letter to patient to contact the office.

## 2024-10-02 ENCOUNTER — Other Ambulatory Visit: Payer: Self-pay

## 2024-10-02 DIAGNOSIS — K209 Esophagitis, unspecified without bleeding: Secondary | ICD-10-CM | POA: Insufficient documentation

## 2024-10-02 DIAGNOSIS — G8929 Other chronic pain: Secondary | ICD-10-CM | POA: Diagnosis not present

## 2024-10-02 DIAGNOSIS — J449 Chronic obstructive pulmonary disease, unspecified: Secondary | ICD-10-CM | POA: Diagnosis not present

## 2024-10-02 DIAGNOSIS — F3160 Bipolar disorder, current episode mixed, unspecified: Secondary | ICD-10-CM | POA: Insufficient documentation

## 2024-10-02 DIAGNOSIS — F17210 Nicotine dependence, cigarettes, uncomplicated: Secondary | ICD-10-CM | POA: Diagnosis not present

## 2024-10-02 DIAGNOSIS — E119 Type 2 diabetes mellitus without complications: Secondary | ICD-10-CM | POA: Insufficient documentation

## 2024-10-02 DIAGNOSIS — K920 Hematemesis: Secondary | ICD-10-CM | POA: Diagnosis present

## 2024-10-02 DIAGNOSIS — N390 Urinary tract infection, site not specified: Secondary | ICD-10-CM | POA: Diagnosis not present

## 2024-10-02 DIAGNOSIS — R945 Abnormal results of liver function studies: Secondary | ICD-10-CM | POA: Insufficient documentation

## 2024-10-02 DIAGNOSIS — Z8719 Personal history of other diseases of the digestive system: Secondary | ICD-10-CM | POA: Insufficient documentation

## 2024-10-02 DIAGNOSIS — F1290 Cannabis use, unspecified, uncomplicated: Secondary | ICD-10-CM | POA: Insufficient documentation

## 2024-10-02 DIAGNOSIS — K2961 Other gastritis with bleeding: Principal | ICD-10-CM | POA: Insufficient documentation

## 2024-10-02 LAB — COMPREHENSIVE METABOLIC PANEL WITH GFR
ALT: 207 U/L — ABNORMAL HIGH (ref 0–44)
AST: 189 U/L — ABNORMAL HIGH (ref 15–41)
Albumin: 4.4 g/dL (ref 3.5–5.0)
Alkaline Phosphatase: 112 U/L (ref 38–126)
Anion gap: 9 (ref 5–15)
BUN: 17 mg/dL (ref 6–20)
CO2: 29 mmol/L (ref 22–32)
Calcium: 10.7 mg/dL — ABNORMAL HIGH (ref 8.9–10.3)
Chloride: 101 mmol/L (ref 98–111)
Creatinine, Ser: 0.68 mg/dL (ref 0.44–1.00)
GFR, Estimated: >60 mL/min
Glucose, Bld: 117 mg/dL — ABNORMAL HIGH (ref 70–99)
Potassium: 3.6 mmol/L (ref 3.5–5.1)
Sodium: 139 mmol/L (ref 135–145)
Total Bilirubin: 0.7 mg/dL (ref 0.0–1.2)
Total Protein: 7.4 g/dL (ref 6.5–8.1)

## 2024-10-02 LAB — URINALYSIS, ROUTINE W REFLEX MICROSCOPIC
Bilirubin Urine: NEGATIVE
Glucose, UA: NEGATIVE mg/dL
Hgb urine dipstick: NEGATIVE
Ketones, ur: 5 mg/dL — AB
Nitrite: POSITIVE — AB
Protein, ur: NEGATIVE mg/dL
Specific Gravity, Urine: 1.026 (ref 1.005–1.030)
WBC, UA: >50 WBC/hpf (ref 0–5)
pH: 6 (ref 5.0–8.0)

## 2024-10-02 LAB — CBC
HCT: 45 % (ref 36.0–46.0)
Hemoglobin: 15.1 g/dL — ABNORMAL HIGH (ref 12.0–15.0)
MCH: 28.7 pg (ref 26.0–34.0)
MCHC: 33.6 g/dL (ref 30.0–36.0)
MCV: 85.4 fL (ref 80.0–100.0)
Platelets: 278 K/uL (ref 150–400)
RBC: 5.27 MIL/uL — ABNORMAL HIGH (ref 3.87–5.11)
RDW: 12.6 % (ref 11.5–15.5)
WBC: 12.3 K/uL — ABNORMAL HIGH (ref 4.0–10.5)
nRBC: 0 % (ref 0.0–0.2)

## 2024-10-02 LAB — LIPASE, BLOOD: Lipase: 32 U/L (ref 11–51)

## 2024-10-02 NOTE — ED Triage Notes (Signed)
 Pt to ED via POV from home. Pt reports emesis x5 days and yesterday started vomiting bright red blood. Pt also reports epigastric pain.

## 2024-10-03 ENCOUNTER — Observation Stay
Admission: EM | Admit: 2024-10-03 | Discharge: 2024-10-03 | Disposition: A | Discharge status: Home / Self Care | Attending: Internal Medicine | Admitting: Internal Medicine

## 2024-10-03 ENCOUNTER — Other Ambulatory Visit: Payer: Self-pay

## 2024-10-03 ENCOUNTER — Observation Stay

## 2024-10-03 ENCOUNTER — Encounter
Admission: EM | Disposition: A | Discharge status: Home / Self Care | Payer: Self-pay | Source: Home / Self Care | Attending: Emergency Medicine

## 2024-10-03 ENCOUNTER — Emergency Department

## 2024-10-03 ENCOUNTER — Encounter: Payer: Self-pay | Admitting: Internal Medicine

## 2024-10-03 DIAGNOSIS — K920 Hematemesis: Secondary | ICD-10-CM | POA: Diagnosis not present

## 2024-10-03 DIAGNOSIS — K209 Esophagitis, unspecified without bleeding: Secondary | ICD-10-CM | POA: Diagnosis not present

## 2024-10-03 DIAGNOSIS — K922 Gastrointestinal hemorrhage, unspecified: Secondary | ICD-10-CM | POA: Diagnosis not present

## 2024-10-03 DIAGNOSIS — K2961 Other gastritis with bleeding: Secondary | ICD-10-CM

## 2024-10-03 DIAGNOSIS — J449 Chronic obstructive pulmonary disease, unspecified: Secondary | ICD-10-CM | POA: Diagnosis present

## 2024-10-03 DIAGNOSIS — F3160 Bipolar disorder, current episode mixed, unspecified: Secondary | ICD-10-CM | POA: Diagnosis present

## 2024-10-03 DIAGNOSIS — G8929 Other chronic pain: Secondary | ICD-10-CM | POA: Insufficient documentation

## 2024-10-03 DIAGNOSIS — N390 Urinary tract infection, site not specified: Secondary | ICD-10-CM

## 2024-10-03 DIAGNOSIS — Z8719 Personal history of other diseases of the digestive system: Secondary | ICD-10-CM

## 2024-10-03 DIAGNOSIS — R7989 Other specified abnormal findings of blood chemistry: Secondary | ICD-10-CM

## 2024-10-03 HISTORY — PX: ESOPHAGOGASTRODUODENOSCOPY: SHX5428

## 2024-10-03 LAB — PROTIME-INR
INR: 1 (ref 0.8–1.2)
Prothrombin Time: 13.6 s (ref 11.4–15.2)

## 2024-10-03 LAB — HEPATITIS PANEL, ACUTE
HCV Ab: REACTIVE — AB
Hep A IgM: NONREACTIVE
Hep B C IgM: NONREACTIVE
Hepatitis B Surface Ag: NONREACTIVE

## 2024-10-03 LAB — HEMOGLOBIN AND HEMATOCRIT, BLOOD
HCT: 45.1 % (ref 36.0–46.0)
Hemoglobin: 15.1 g/dL — ABNORMAL HIGH (ref 12.0–15.0)

## 2024-10-03 MED ORDER — PROPOFOL 10 MG/ML IV BOLUS
INTRAVENOUS | Status: DC | PRN
  Administered 2024-10-03 (×2): 50 mg via INTRAVENOUS

## 2024-10-03 MED ORDER — BUPRENORPHINE HCL-NALOXONE HCL 8-2 MG SL SUBL
1.5000 | SUBLINGUAL_TABLET | Freq: Two times a day (BID) | SUBLINGUAL | Status: DC
  Administered 2024-10-03: 1.5 via SUBLINGUAL
  Filled 2024-10-03: qty 2

## 2024-10-03 MED ORDER — PROPOFOL 500 MG/50ML IV EMUL
INTRAVENOUS | Status: DC | PRN
  Administered 2024-10-03: 75 ug/kg/min via INTRAVENOUS

## 2024-10-03 MED ORDER — PANTOPRAZOLE SODIUM 40 MG IV SOLR
40.0000 mg | Freq: Two times a day (BID) | INTRAVENOUS | Status: DC
  Administered 2024-10-03: 40 mg via INTRAVENOUS
  Filled 2024-10-03: qty 10

## 2024-10-03 MED ORDER — HYDROMORPHONE HCL 1 MG/ML IJ SOLN
1.0000 mg | Freq: Once | INTRAMUSCULAR | Status: AC
  Administered 2024-10-03: 1 mg via INTRAVENOUS
  Filled 2024-10-03: qty 1

## 2024-10-03 MED ORDER — IOHEXOL 350 MG/ML SOLN
100.0000 mL | Freq: Once | INTRAVENOUS | Status: AC | PRN
  Administered 2024-10-03: 100 mL via INTRAVENOUS

## 2024-10-03 MED ORDER — DEXMEDETOMIDINE HCL IN NACL 80 MCG/20ML IV SOLN
INTRAVENOUS | Status: DC | PRN
  Administered 2024-10-03: 12 ug via INTRAVENOUS
  Administered 2024-10-03: 8 ug via INTRAVENOUS

## 2024-10-03 MED ORDER — SUCRALFATE 1 G PO TABS
1.0000 g | ORAL_TABLET | Freq: Once | ORAL | Status: AC
  Administered 2024-10-03: 1 g via ORAL
  Filled 2024-10-03: qty 1

## 2024-10-03 MED ORDER — HYDROMORPHONE HCL 1 MG/ML IJ SOLN
0.5000 mg | INTRAMUSCULAR | Status: DC | PRN
  Administered 2024-10-03: 0.5 mg via INTRAVENOUS
  Filled 2024-10-03: qty 0.5

## 2024-10-03 MED ORDER — GLYCOPYRROLATE 0.2 MG/ML IJ SOLN
INTRAMUSCULAR | Status: AC
  Filled 2024-10-03: qty 1

## 2024-10-03 MED ORDER — ONDANSETRON HCL 4 MG/2ML IJ SOLN
4.0000 mg | Freq: Once | INTRAMUSCULAR | Status: AC
  Administered 2024-10-03: 4 mg via INTRAVENOUS
  Filled 2024-10-03: qty 2

## 2024-10-03 MED ORDER — ONDANSETRON HCL 4 MG/2ML IJ SOLN
4.0000 mg | Freq: Four times a day (QID) | INTRAMUSCULAR | Status: DC | PRN
  Administered 2024-10-03: 4 mg via INTRAVENOUS
  Filled 2024-10-03: qty 2

## 2024-10-03 MED ORDER — SODIUM CHLORIDE 0.9 % IV SOLN
1.0000 g | Freq: Every day | INTRAVENOUS | Status: DC
  Administered 2024-10-03: 1 g via INTRAVENOUS
  Filled 2024-10-03: qty 10

## 2024-10-03 MED ORDER — LIDOCAINE HCL (PF) 2 % IJ SOLN
INTRAMUSCULAR | Status: AC
  Filled 2024-10-03: qty 5

## 2024-10-03 MED ORDER — SODIUM CHLORIDE 0.9 % IV SOLN: INTRAVENOUS | Status: DC

## 2024-10-03 MED ORDER — SODIUM CHLORIDE 0.9 % IV SOLN
2.0000 g | Freq: Once | INTRAVENOUS | Status: AC
  Administered 2024-10-03: 2 g via INTRAVENOUS
  Filled 2024-10-03: qty 20

## 2024-10-03 MED ORDER — METOCLOPRAMIDE HCL 5 MG/ML IJ SOLN
10.0000 mg | Freq: Once | INTRAMUSCULAR | Status: AC
  Administered 2024-10-03: 10 mg via INTRAVENOUS
  Filled 2024-10-03: qty 2

## 2024-10-03 MED ORDER — ALUM & MAG HYDROXIDE-SIMETH 200-200-20 MG/5ML PO SUSP
30.0000 mL | Freq: Once | ORAL | Status: AC
  Administered 2024-10-03: 30 mL via ORAL
  Filled 2024-10-03: qty 30

## 2024-10-03 MED ORDER — GLYCOPYRROLATE 0.2 MG/ML IJ SOLN
INTRAMUSCULAR | Status: DC | PRN
  Administered 2024-10-03: .2 mg via INTRAVENOUS

## 2024-10-03 MED ORDER — MORPHINE SULFATE (PF) 4 MG/ML IV SOLN
4.0000 mg | Freq: Once | INTRAVENOUS | Status: AC
  Administered 2024-10-03: 4 mg via INTRAVENOUS
  Filled 2024-10-03: qty 1

## 2024-10-03 MED ORDER — PANTOPRAZOLE SODIUM 40 MG PO TBEC
40.0000 mg | DELAYED_RELEASE_TABLET | Freq: Two times a day (BID) | ORAL | 4 refills | Status: AC
  Filled 2024-10-03: qty 60, 30d supply, fill #0

## 2024-10-03 MED ORDER — PANTOPRAZOLE SODIUM 40 MG IV SOLR
80.0000 mg | Freq: Once | INTRAVENOUS | Status: AC
  Administered 2024-10-03: 80 mg via INTRAVENOUS
  Filled 2024-10-03: qty 20

## 2024-10-03 MED ORDER — CEPHALEXIN 500 MG PO CAPS
500.0000 mg | ORAL_CAPSULE | Freq: Three times a day (TID) | ORAL | 0 refills | Status: AC
  Filled 2024-10-03: qty 9, 3d supply, fill #0

## 2024-10-03 NOTE — Assessment & Plan Note (Signed)
 Rocephin   Follow cultures

## 2024-10-03 NOTE — Anesthesia Postprocedure Evaluation (Signed)
"   Anesthesia Post Note  Patient: Shelby Hatfield  Procedure(s) Performed: EGD (ESOPHAGOGASTRODUODENOSCOPY)  Patient location during evaluation: PACU Anesthesia Type: General Level of consciousness: awake and alert Pain management: pain level controlled Vital Signs Assessment: post-procedure vital signs reviewed and stable Respiratory status: spontaneous breathing, nonlabored ventilation, respiratory function stable and patient connected to nasal cannula oxygen Cardiovascular status: blood pressure returned to baseline and stable Postop Assessment: no apparent nausea or vomiting Anesthetic complications: no   No notable events documented.   Last Vitals:  Vitals:   10/03/24 1158 10/03/24 1207  BP: 104/74 104/80  Pulse: 86 81  Resp: 10 11  Temp:    SpO2: 97% 100%    Last Pain:  Vitals:   10/03/24 1207  TempSrc:   PainSc: 0-No pain                 Redell MARLA Breaker      "

## 2024-10-03 NOTE — H&P (Signed)
 " History and Physical    PatientKEYLAH Hatfield FMW:[id number] DOB: 14-Oct-1975 DOA: 10/03/2024 DOS: the patient was seen and examined on 10/03/2024 PCP: Patient, No Pcp Per  Patient coming from: Home  Chief Complaint:  Chief Complaint  Patient presents with   Hematemesis    HPI: Shelby Hatfield is a 49 y.o. female with medical history significant for COPD, bipolar mood disorder, chronic pain on Suboxone , upper GI bleeding 2022 with severe esophagitis on EGD being admitted with upper GI bleed.  She presented with hematemesis, spitting up small amounts of blood and occasionally vomiting, up to half cup of blood per her description.  She has associated epigastric discomfort.  She denies chest pain or lightheadedness.  Denies alcohol use.  Denies over-the-counter NSAID use. Vitals stable in the ED, hemoglobin 15.1, unchanged on recheck 6 hours later, mild LFT elevation of AST/ALT 189/207.  Urinalysis consistent with UTI Stool guaiac done by ED provider was positive Patient treated with Protonix , antiemetics, IV pain meds, and started on Rocephin . CTA with no active bleeding  Admission requested     Past Medical History:  Diagnosis Date   Adenotonsillar hypertrophy 03/2012   snores during sleep; denies apnea; states occ. wakes up coughing   Anemia    Anxiety    Bipolar 1 disorder (HCC)    COPD (chronic obstructive pulmonary disease) (HCC)    stage 2, 2 liters of oxygen at night for ATX   Depression    Diabetes mellitus without complication (HCC)    Drug-seeking behavior    GERD (gastroesophageal reflux disease)    GI bleed    Heart failure (HCC)    Hypercholesterolemia    Migraines    Obesity    Pneumonia    Wears dentures    upper denture   Past Surgical History:  Procedure Laterality Date   APPENDECTOMY     BIOPSY  02/14/2016   Procedure: BIOPSY;  Surgeon: Margo LITTIE Haddock, MD;  Location: [facility];  Service: Endoscopy;;  gastric biopsies   cardiac thoracic surgery      CESAREAN SECTION  08/31/2001   total of  3   CHOLECYSTECTOMY     DILATION AND EVACUATION  04/09/2000   ESOPHAGEAL DILATION N/A 09/03/2016   Procedure: ESOPHAGEAL DILATION;  Surgeon: Lamar CHRISTELLA Hollingshead, MD;  Location: [facility];  Service: Endoscopy;  Laterality: N/A;   ESOPHAGOGASTRODUODENOSCOPY (EGD) WITH PROPOFOL  N/A 02/14/2016   Dr. haddock: Large hiatal hernia, nonbleeding cratered gastric ulcer. Multiple nonbleeding erosions in the gastric fundus. Diffuse moderate inflammation, erosions, erythema of the gastric antrum. All biopsies benign with no evidence of H. pylori.   ESOPHAGOGASTRODUODENOSCOPY (EGD) WITH PROPOFOL  N/A 06/02/2016   Procedure: ESOPHAGOGASTRODUODENOSCOPY (EGD) WITH PROPOFOL ;  Surgeon: Norleen Hint, MD;  Location: [facility] ENDOSCOPY;  Service: Endoscopy;  Laterality: N/A;   ESOPHAGOGASTRODUODENOSCOPY (EGD) WITH PROPOFOL  N/A 09/03/2016   Procedure: ESOPHAGOGASTRODUODENOSCOPY (EGD) WITH PROPOFOL ;  Surgeon: Lamar CHRISTELLA Hollingshead, MD;  Location: [facility];  Service: Endoscopy;  Laterality: N/A;   ESOPHAGOGASTRODUODENOSCOPY (EGD) WITH PROPOFOL  N/A 09/11/2021   Procedure: ESOPHAGOGASTRODUODENOSCOPY (EGD) WITH PROPOFOL ;  Surgeon: Leigh Elspeth SQUIBB, MD;  Location: [facility] ENDOSCOPY;  Service: Gastroenterology;  Laterality: N/A;   HIATAL HERNIA REPAIR  10/2015   planning for repeat surgery in 1/18   open paraesophageal hernia repair with gastroexy  09/2015   Dr. Lupita Danger   TONSILLECTOMY     adenoidectomy   TONSILLECTOMY AND ADENOIDECTOMY  04/12/2012   Procedure: TONSILLECTOMY AND ADENOIDECTOMY;  Surgeon: Ana ORN  Karis, MD;  Location:  SURGERY CENTER;  Service: ENT;  Laterality: Bilateral;   TUBAL LIGATION  08/31/2001   Social History:  reports that she has been smoking cigarettes. She has a 5.9 pack-year smoking history. She has never used smokeless tobacco. She reports current drug use. Drugs: Marijuana and Cocaine. She reports that she does not drink alcohol.  Allergies[1]  Family History   Problem Relation Age of Onset   Cancer Other    Hypertension Mother    Heart disease Mother    Cancer Mother        ovarian   Cancer Maternal Aunt        not sure primary, in kidney and colon   Cancer Maternal Uncle        lung cancer    Stroke Maternal Grandfather     Prior to Admission medications  Medication Sig Start Date End Date Taking? Authorizing Provider  buprenorphine -naloxone  (SUBOXONE ) 8-2 mg SUBL SL tablet Place 1.5 tablets under the tongue 2 (two) times daily. 09/18/24  Yes [provider]    Physical Exam: Vitals:   10/02/24 1833 10/02/24 1835 10/03/24 0143 10/03/24 0343  BP:  (!) 136/93  126/73  Pulse: 75   85  Resp: 20   16  Temp: 98 F (36.7 C)  99 F (37.2 C)   TempSrc:   Oral   SpO2: 95%   100%   Physical Exam Vitals and nursing note reviewed.  Constitutional:      General: She is not in acute distress.    Comments: Ill-appearing  HENT:     Head: Normocephalic and atraumatic.  Cardiovascular:     Rate and Rhythm: Normal rate and regular rhythm.     Heart sounds: Normal heart sounds.  Pulmonary:     Effort: Pulmonary effort is normal.     Breath sounds: Normal breath sounds.  Abdominal:     Palpations: Abdomen is soft.     Tenderness: There is abdominal tenderness in the epigastric area.  Neurological:     Mental Status: Mental status is at baseline.     Labs on Admission: I have personally reviewed following labs and imaging studies  CBC: Recent Labs  Lab 10/02/24 1836 10/03/24 0157  WBC 12.3*  --   HGB 15.1* 15.1*  HCT 45.0 45.1  MCV 85.4  --   PLT 278  --    Basic Metabolic Panel: Recent Labs  Lab 10/02/24 1836  NA 139  K 3.6  CL 101  CO2 29  GLUCOSE 117*  BUN 17  CREATININE 0.68  CALCIUM  10.7*   GFR: CrCl cannot be calculated (Unknown ideal weight.). Liver Function Tests: Recent Labs  Lab 10/02/24 1836  AST 189*  ALT 207*  ALKPHOS 112  BILITOT 0.7  PROT 7.4  ALBUMIN 4.4   Recent Labs  Lab  10/02/24 1836  LIPASE 32   No results for input(s): AMMONIA in the last 168 hours. Coagulation Profile: Recent Labs  Lab 10/03/24 0157  INR 1.0   Cardiac Enzymes: No results for input(s): CKTOTAL, CKMB, CKMBINDEX, TROPONINI in the last 168 hours. BNP (last 3 results) No results for input(s): PROBNP in the last 8760 hours. HbA1C: No results for input(s): HGBA1C in the last 72 hours. CBG: No results for input(s): GLUCAP in the last 168 hours. Lipid Profile: No results for input(s): CHOL, HDL, LDLCALC, TRIG, CHOLHDL, LDLDIRECT in the last 72 hours. Thyroid Function Tests: No results for input(s): TSH, T4TOTAL, FREET4, T3FREE, THYROIDAB  in the last 72 hours. Anemia Panel: No results for input(s): VITAMINB12, FOLATE, FERRITIN, TIBC, IRON , RETICCTPCT in the last 72 hours. Urine analysis:    Component Value Date/Time   COLORURINE AMBER (A) 10/02/2024 1835   APPEARANCEUR HAZY (A) 10/02/2024 1835   APPEARANCEUR Clear 01/16/2015 1114   LABSPEC 1.026 10/02/2024 1835   LABSPEC 1.006 01/16/2015 1114   PHURINE 6.0 10/02/2024 1835   GLUCOSEU NEGATIVE 10/02/2024 1835   GLUCOSEU Negative 01/16/2015 1114   HGBUR NEGATIVE 10/02/2024 1835   BILIRUBINUR NEGATIVE 10/02/2024 1835   BILIRUBINUR Negative 01/16/2015 1114   KETONESUR 5 (A) 10/02/2024 1835   PROTEINUR NEGATIVE 10/02/2024 1835   UROBILINOGEN 0.2 05/13/2015 1948   NITRITE POSITIVE (A) 10/02/2024 1835   LEUKOCYTESUR TRACE (A) 10/02/2024 1835   LEUKOCYTESUR Negative 01/16/2015 1114    Radiological Exams on Admission: CT ANGIO GI BLEED Result Date: 10/03/2024 EXAM: CTA ABDOMEN AND PELVIS WITH CONTRAST 10/03/2024 03:20:52 AM TECHNIQUE: CTA images of the abdomen and pelvis with intravenous contrast. Three-dimensional MIP/volume rendered formations were performed. Automated exposure control, iterative reconstruction, and/or weight based adjustment of the mA/kV was utilized to reduce the  radiation dose to as low as reasonably achievable. COMPARISON: None available. CLINICAL HISTORY: Concern for upper GI bleed, abdominal pain. FINDINGS: VASCULATURE: GI BLEED: No contrast extravasation to localize GI bleed. AORTA: No acute finding. No abdominal aortic aneurysm. No dissection. CELIAC TRUNK: No acute finding. No occlusion or significant stenosis. SUPERIOR MESENTRIC ARTERY: No acute finding. No occlusion or significant stenosis. INFERIOR MESENTERIC ARTERY: No acute finding. No occlusion or significant stenosis. RENAL ARTERIES: No acute finding. No occlusion or significant stenosis. ILIAC ARTERIES: No acute finding. No occlusion or significant stenosis. ABDOMEN/PELVIS: LOWER CHEST: Linear scarring in the left lung base. LIVER: The liver is unremarkable. GALLBLADDER AND BILE DUCTS: Prior cholecystectomy. No biliary ductal dilatation. SPLEEN: The spleen is unremarkable. PANCREAS: The pancreas is unremarkable. ADRENAL GLANDS: Bilateral adrenal glands demonstrate no acute abnormality. KIDNEYS, URETERS AND BLADDER: No stones in the kidneys or ureters. No hydronephrosis. No perinephric or periureteral stranding. Urinary bladder is unremarkable. GI AND BOWEL: Mild wall thickening thickening in the mid stomach suggesting gastritis. Scattered sigmoid diverticula. There is no bowel obstruction. No abnormal bowel wall thickening or distension. REPRODUCTIVE: Reproductive organs are unremarkable. PERITONEUM AND RETROPERITONEUM: No ascites or free air. LYMPH NODES: No lymphadenopathy. BONES AND SOFT TISSUES: Several small ventral hernias containing fat. No acute abnormality of the bones. IMPRESSION: 1. No active GI bleeding visualized . 2. Mild wall  thickening in the mid stomach suggesting gastritis. 3. Scattered sigmoid diverticula without evidence of diverticulitis. Electronically signed by: Franky Crease MD 10/03/2024 03:33 AM EST RP Workstation: [id number]   Data Reviewed for HPI: Relevant notes from primary care  and specialist visits, past discharge summaries as available in EHR, including Care Everywhere. Prior diagnostic testing as pertinent to current admission diagnoses Updated medications and problem lists for reconciliation ED course, including vitals, labs, imaging, treatment and response to treatment Triage notes, nursing and pharmacy notes and ED provider's notes Notable results as noted above in HPI      Assessment and Plan: * Upper GI bleed History of GI bleed secondary to severe esophagitis 2022 Hemoglobin stable so far and patient hemodynamically stable CTA with no active bleeding Denies alcohol or NSAID use Will keep n.p.o. in case of procedure IV hydration Protonix  40 mg IV twice daily IV antiemetics, IV pain meds GI consult  Abnormal LFTs Denies alcohol use Will get acute hepatitis panel  Urinary tract  infection Rocephin  Follow cultures  Chronic pain Continue Suboxone   COPD (chronic obstructive pulmonary disease) (HCC) Not acutely exacerbated DuoNebs as needed  Mixed bipolar I disorder (HCC) Resume home meds once verified    DVT prophylaxis: SCD  Consults: GI  Advance Care Planning:   Code Status: Prior   Family Communication: none  Disposition Plan: Back to previous home environment  Severity of Illness: The appropriate patient status for this patient is OBSERVATION. Observation status is judged to be reasonable and necessary in order to provide the required intensity of service to ensure the patient's safety. The patient's presenting symptoms, physical exam findings, and initial radiographic and laboratory data in the context of their medical condition is felt to place them at decreased risk for further clinical deterioration. Furthermore, it is anticipated that the patient will be medically stable for discharge from the hospital within 2 midnights of admission.   Author: Delayne LULLA Solian, MD 10/03/2024 4:30 AM  For on call review [email or web address].       [1]  Allergies Allergen Reactions   Lidocaine  Viscous Hcl Other (See Comments)    Created BLISTERS in the mouth   Penicillins Itching and Rash    Has patient had a PCN reaction causing immediate rash, facial/tongue/throat swelling, SOB or lightheadedness with hypotension: Yes Has patient had a PCN reaction causing severe rash involving mucus membranes or skin necrosis: No Has patient had a PCN reaction that required hospitalization No Has patient had a PCN reaction occurring within the last 10 years: No If all of the above answers are NO, then may proceed with Cephalosporin use.    "

## 2024-10-03 NOTE — Assessment & Plan Note (Signed)
-   Continue Suboxone

## 2024-10-03 NOTE — Assessment & Plan Note (Signed)
 Not acutely exacerbated DuoNebs as needed

## 2024-10-03 NOTE — TOC CM/SW Note (Signed)
 Transition of Care [facility]) CM/SW Note    Transition of Care [facility]) - Inpatient Brief Assessment   Patient Details  Name: Shelby Hatfield MRN: [id number] Date of Birth: November 06, 1975  [facility]) CM/SW Contact:    Alfonso Rummer, LCSW Phone Number: 10/03/2024, 9:52 AM   Clinical Narrative: Completed toc chart review. No toc needs at this time. Please contact should needs arise.    Transition of Care Asessment: Insurance and Status: Insurance coverage has been reviewed Patient has primary care physician: No (pcp listed added to avs) Home environment has been reviewed: single family home   Prior/Current Home Services: No current home services Social Drivers of Health Review: SDOH reviewed no interventions necessary Readmission risk has been reviewed: No Transition of care needs: no transition of care needs at this time

## 2024-10-03 NOTE — Anesthesia Preprocedure Evaluation (Addendum)
"                                    Anesthesia Evaluation  Patient identified by MRN, date of birth, ID band Patient awake    Reviewed: Allergy & Precautions, H&P , NPO status , Patient's Chart, lab work & pertinent test results  Airway Mallampati: II  TM Distance: >3 FB Neck ROM: Full    Dental no notable dental hx. (+) Edentulous Upper, Edentulous Lower, Upper Dentures, Lower Dentures   Pulmonary neg pulmonary ROS, Current Smoker and Patient abstained from smoking.   Pulmonary exam normal breath sounds clear to auscultation       Cardiovascular Exercise Tolerance: Poor negative cardio ROS Normal cardiovascular examII Rhythm:Regular Rate:Normal     Neuro/Psych negative neurological ROS  negative psych ROS   GI/Hepatic negative GI ROS, Neg liver ROS,,,  Endo/Other  negative endocrine ROSdiabetes    Renal/GU negative Renal ROS  negative genitourinary   Musculoskeletal negative musculoskeletal ROS (+)    Abdominal   Peds negative pediatric ROS (+)  Hematology negative hematology ROS (+)   Anesthesia Other Findings   Reproductive/Obstetrics negative OB ROS                              Anesthesia Physical Anesthesia Plan  ASA: 3  Anesthesia Plan: MAC and General   Post-op Pain Management: Minimal or no pain anticipated   Induction: Intravenous  PONV Risk Score and Plan: 1  Airway Management Planned: Mask  Additional Equipment:   Intra-op Plan:   Post-operative Plan:   Informed Consent: I have reviewed the patients History and Physical, chart, labs and discussed the procedure including the risks, benefits and alternatives for the proposed anesthesia with the patient or authorized representative who has indicated his/her understanding and acceptance.       Plan Discussed with: CRNA  Anesthesia Plan Comments:         Anesthesia Quick Evaluation  "

## 2024-10-03 NOTE — ED Notes (Signed)
 MD at bedside.

## 2024-10-03 NOTE — Discharge Summary (Signed)
 " Physician Discharge Summary   Patient: Shelby Hatfield MRN: [id number] DOB: 07/14/1976  Admit date:     10/03/2024  Discharge date: 10/03/2024  Discharge Physician: Drue ONEIDA Potter   PCP: Patient, No Pcp Per   Recommendations at discharge:  Follow-up with PCP  Discharge Diagnoses: Principal Problem:   Upper GI bleed Active Problems:   History of esophagitis/GI bleed   Abnormal LFTs   Urinary tract infection   Mixed bipolar I disorder (HCC)   COPD (chronic obstructive pulmonary disease) (HCC)   Chronic pain  Resolved Problems:   * No resolved hospital problems. *  Hospital Course:  Shelby Hatfield is a 49 y.o. female with medical history significant for COPD, bipolar mood disorder, chronic pain on Suboxone , upper GI bleeding 2022 with severe esophagitis on EGD being admitted with upper GI bleed.  She presented with hematemesis, spitting up small amounts of blood and occasionally vomiting, up to half cup of blood per her description.  She has associated epigastric discomfort.  She denies chest pain or lightheadedness.  Denies alcohol use.  Denies over-the-counter NSAID use. Vitals stable in the ED, hemoglobin 15.1, unchanged on recheck 6 hours later, mild LFT elevation of AST/ALT 189/207.  Urinalysis consistent with UTI Stool guaiac done by ED provider was positive Patient treated with Protonix , antiemetics, IV pain meds, and started on Rocephin . CTA with no active bleeding  Assessment and plan Upper GI bleed History of GI bleed secondary to severe esophagitis 2022 Hemoglobin stable so far and patient hemodynamically stable CTA with no active bleeding Denies alcohol or NSAID use Patient underwent EGD that showed esophagitis, have been cleared for discharge by GI to complete a course of PPI therapy   Abnormal LFTs Follow-up as an outpatient   Urinary tract infection Will complete antibiotic course   Chronic pain Continue Suboxone    COPD (chronic obstructive pulmonary disease)  (HCC) Not acutely exacerbated DuoNebs as needed   Mixed bipolar I disorder (HCC) Resume home meds once verified      Consultants: GI Procedures performed: EGD Disposition: Home Diet recommendation:  Cardiac diet DISCHARGE MEDICATION: Allergies as of 10/03/2024       Reactions   Lidocaine  Viscous Hcl Other (See Comments)   Created BLISTERS in the mouth   Penicillins Itching, Rash   Has patient had a PCN reaction causing immediate rash, facial/tongue/throat swelling, SOB or lightheadedness with hypotension: Yes Has patient had a PCN reaction causing severe rash involving mucus membranes or skin necrosis: No Has patient had a PCN reaction that required hospitalization No Has patient had a PCN reaction occurring within the last 10 years: No If all of the above answers are NO, then may proceed with Cephalosporin use.        Medication List     TAKE these medications    buprenorphine -naloxone  8-2 mg Subl SL tablet Commonly known as: SUBOXONE  Place 1.5 tablets under the tongue 2 (two) times daily.   cephALEXin  500 MG capsule Commonly known as: KEFLEX  Take 1 capsule (500 mg total) by mouth 3 (three) times daily for 3 days.   pantoprazole  40 MG tablet Commonly known as: Protonix  Take 1 tablet (40 mg total) by mouth 2 (two) times daily.        Discharge Exam: Filed Weights   10/03/24 1035  Weight: 78 kg   Constitutional: Elderly female laying in bed in no acute distress    Head: Normocephalic and atraumatic.  Cardiovascular:     Rate and Rhythm: Normal rate  and regular rhythm.     Heart sounds: Normal heart sounds.  Pulmonary:     Effort: Pulmonary effort is normal.     Breath sounds: Normal breath sounds.  Abdominal: Abdominal tenderness improved Neurological:     Mental Status: Mental status is at baseline.  Condition at discharge: good  The results of significant diagnostics from this hospitalization (including imaging, microbiology, ancillary and  laboratory) are listed below for reference.   Imaging Studies: CT ANGIO GI BLEED Result Date: 10/03/2024 EXAM: CTA ABDOMEN AND PELVIS WITH CONTRAST 10/03/2024 03:20:52 AM TECHNIQUE: CTA images of the abdomen and pelvis with intravenous contrast. Three-dimensional MIP/volume rendered formations were performed. Automated exposure control, iterative reconstruction, and/or weight based adjustment of the mA/kV was utilized to reduce the radiation dose to as low as reasonably achievable. COMPARISON: None available. CLINICAL HISTORY: Concern for upper GI bleed, abdominal pain. FINDINGS: VASCULATURE: GI BLEED: No contrast extravasation to localize GI bleed. AORTA: No acute finding. No abdominal aortic aneurysm. No dissection. CELIAC TRUNK: No acute finding. No occlusion or significant stenosis. SUPERIOR MESENTRIC ARTERY: No acute finding. No occlusion or significant stenosis. INFERIOR MESENTERIC ARTERY: No acute finding. No occlusion or significant stenosis. RENAL ARTERIES: No acute finding. No occlusion or significant stenosis. ILIAC ARTERIES: No acute finding. No occlusion or significant stenosis. ABDOMEN/PELVIS: LOWER CHEST: Linear scarring in the left lung base. LIVER: The liver is unremarkable. GALLBLADDER AND BILE DUCTS: Prior cholecystectomy. No biliary ductal dilatation. SPLEEN: The spleen is unremarkable. PANCREAS: The pancreas is unremarkable. ADRENAL GLANDS: Bilateral adrenal glands demonstrate no acute abnormality. KIDNEYS, URETERS AND BLADDER: No stones in the kidneys or ureters. No hydronephrosis. No perinephric or periureteral stranding. Urinary bladder is unremarkable. GI AND BOWEL: Mild wall thickening thickening in the mid stomach suggesting gastritis. Scattered sigmoid diverticula. There is no bowel obstruction. No abnormal bowel wall thickening or distension. REPRODUCTIVE: Reproductive organs are unremarkable. PERITONEUM AND RETROPERITONEUM: No ascites or free air. LYMPH NODES: No lymphadenopathy.  BONES AND SOFT TISSUES: Several small ventral hernias containing fat. No acute abnormality of the bones. IMPRESSION: 1. No active GI bleeding visualized . 2. Mild wall  thickening in the mid stomach suggesting gastritis. 3. Scattered sigmoid diverticula without evidence of diverticulitis. Electronically signed by: Franky Crease MD 10/03/2024 03:33 AM EST RP Workstation: [id number]    Microbiology: Results for orders placed or performed during the hospital encounter of 09/09/21  Urine Culture     Status: Abnormal   Collection Time: 09/09/21 10:12 PM   Specimen: Urine, Clean Catch  Result Value Ref Range Status   Specimen Description URINE, CLEAN CATCH  Final   Special Requests   Final    NONE Performed at [facility], [address], [place], [place] [address]    Culture >=100,000 COLONIES/mL ESCHERICHIA COLI (A)  Final   Report Status 09/12/2021 FINAL  Final   Organism ID, Bacteria ESCHERICHIA COLI (A)  Final      Susceptibility   Escherichia coli - MIC*    AMPICILLIN 8 SENSITIVE Sensitive     CEFAZOLIN <=4 SENSITIVE Sensitive     CEFEPIME <=0.12 SENSITIVE Sensitive     CEFTRIAXONE  <=0.25 SENSITIVE Sensitive     CIPROFLOXACIN <=0.25 SENSITIVE Sensitive     GENTAMICIN <=1 SENSITIVE Sensitive     IMIPENEM <=0.25 SENSITIVE Sensitive     NITROFURANTOIN  <=16 SENSITIVE Sensitive     TRIMETH /SULFA  <=20 SENSITIVE Sensitive     AMPICILLIN/SULBACTAM <=2 SENSITIVE Sensitive     PIP/TAZO <=4 SENSITIVE Sensitive     * >=  100,000 COLONIES/mL ESCHERICHIA COLI  Resp Panel by RT-PCR (Flu A&B, Covid) Nasopharyngeal Swab     Status: Abnormal   Collection Time: 09/10/21 10:10 AM   Specimen: Nasopharyngeal Swab; Nasopharyngeal(NP) swabs in vial transport medium  Result Value Ref Range Status   SARS Coronavirus 2 by RT PCR POSITIVE (A) NEGATIVE Final    Comment: (NOTE) SARS-CoV-2 target nucleic acids are DETECTED.  The SARS-CoV-2 RNA is generally detectable in upper respiratory specimens during  the acute phase of infection. Positive results are indicative of the presence of the identified virus, but do not rule out bacterial infection or co-infection with other pathogens not detected by the test. Clinical correlation with patient history and other diagnostic information is necessary to determine patient infection status. The expected result is Negative.  Fact Sheet for Patients: bloggercourse.com  Fact Sheet for Healthcare Providers: seriousbroker.it  This test is not yet approved or cleared by the [place]  FDA and  has been authorized for detection and/or diagnosis of SARS-CoV-2 by FDA under an Emergency Use Authorization (EUA).  This EUA will remain in effect (meaning this test can be used) for the duration of  the COVID-19 declaration under Section 564(b)(1) of the A ct, 21 U.S.C. section 360bbb-3(b)(1), unless the authorization is terminated or revoked sooner.     Influenza A by PCR NEGATIVE NEGATIVE Final   Influenza B by PCR NEGATIVE NEGATIVE Final    Comment: (NOTE) The Xpert Xpress SARS-CoV-2/FLU/RSV plus assay is intended as an aid in the diagnosis of influenza from Nasopharyngeal swab specimens and should not be used as a sole basis for treatment. Nasal washings and aspirates are unacceptable for Xpert Xpress SARS-CoV-2/FLU/RSV testing.  Fact Sheet for Patients: bloggercourse.com  Fact Sheet for Healthcare Providers: seriousbroker.it  This test is not yet approved or cleared by the [place]  FDA and has been authorized for detection and/or diagnosis of SARS-CoV-2 by FDA under an Emergency Use Authorization (EUA). This EUA will remain in effect (meaning this test can be used) for the duration of the COVID-19 declaration under Section 564(b)(1) of the Act, 21 U.S.C. section 360bbb-3(b)(1), unless the authorization is terminated  or revoked.  Performed at [facility], [address] [place], [place] [address]   Surgical pcr screen     Status: Abnormal   Collection Time: 09/11/21  1:22 AM   Specimen: Nasal Mucosa; Nasal Swab  Result Value Ref Range Status   MRSA, PCR POSITIVE (A) NEGATIVE Final    Comment: RESULT CALLED TO, READ BACK BY AND VERIFIED WITH: RN HOLLY H. 09/11/21@3 :28 by TW    Staphylococcus aureus POSITIVE (A) NEGATIVE Final    Comment: (NOTE) The Xpert SA Assay (FDA approved for NASAL specimens in patients 31 years of age and older), is one component of a comprehensive surveillance program. It is not intended to diagnose infection nor to guide or monitor treatment. Performed at [facility], [address] [place], [place] [address]     Labs: CBC: Recent Labs  Lab 10/02/24 1836 10/03/24 0157  WBC 12.3*  --   HGB 15.1* 15.1*  HCT 45.0 45.1  MCV 85.4  --   PLT 278  --    Basic Metabolic Panel: Recent Labs  Lab 10/02/24 1836  NA 139  K 3.6  CL 101  CO2 29  GLUCOSE 117*  BUN 17  CREATININE 0.68  CALCIUM  10.7*   Liver Function Tests: Recent Labs  Lab 10/02/24 1836  AST 189*  ALT 207*  ALKPHOS 112  BILITOT 0.7  PROT 7.4  ALBUMIN 4.4   CBG: No results for input(s): GLUCAP in the last 168 hours.  Discharge time spent:  37 minutes.  Signed: Drue ONEIDA Potter, MD [facility] 10/03/2024 "

## 2024-10-03 NOTE — Assessment & Plan Note (Addendum)
 History of GI bleed secondary to severe esophagitis 2022 Hemoglobin stable so far and patient hemodynamically stable CTA with no active bleeding Denies alcohol or NSAID use Will keep n.p.o. in case of procedure IV hydration Protonix  40 mg IV twice daily IV antiemetics, IV pain meds GI consult

## 2024-10-03 NOTE — Progress Notes (Signed)
 The patient underwent EGD for hematemesis.  The EGD showed a normal stomach despite possible gastritis seen on the CT scan.  There was severe esophagitis that was likely the cause of bleeding and has been found on multiple previous EGDs.  The patient should follow-up with her outpatient gastroenterologist and be put on a daily PPI and avoid any alcohol and NSAID use.  I will sign off.  Please call if any further GI concerns or questions.  We would like to thank you for the opportunity to participate in the care of Shelby Hatfield.

## 2024-10-03 NOTE — Consult Note (Signed)
 "      Shelby Copping, MD [facility]  [address], [address] [place], [place] [address] Phone: [phone] Fax : [phone]  Consultation  Referring Provider:     Dr. Cleatus Primary Care Physician:  Patient, No Pcp Per Primary Gastroenterologist: [facility]         Reason for Consultation:     Hematemesis  Date of Admission:  10/03/2024 Date of Consultation:  10/03/2024         HPI:   Shelby Hatfield is a 49 y.o. female with a history of COPD, hyperlipidemia, diabetes, bipolar disorder who reported that she had upper abdominal pain with hematemesis.  The patient has a history of having multiple EGDs in the past with esophagitis found in 2022.  In 2017 the patient also had an upper endoscopy with esophagitis at that time.  The patient now underwent a CT scan this morning for the upper GI bleed that showed no active bleeding but did show mild wall thickening of the mid stomach suggesting gastritis.  The results are as follows:  IMPRESSION: 1. No active GI bleeding visualized . 2. Mild wall  thickening in the mid stomach suggesting gastritis. 3. Scattered sigmoid diverticula without evidence of diverticulitis.  The patient had been kept n.p.o. last night.  She reports that she has not had any vomiting since yesterday and she states that she has not had any black stools.  She does report that she was told by one of the doctors that her stools had blood in it when they did a rectal exam report is Hemoccult positive.  She continues to have some mild abdominal pain.  She denies ever having a colonoscopy in the past. There is no report of any use of NSAIDs or possible over-the-counter medications that could be the culprit of her hematemesis.  She also denies any sick contacts. The previous upper endoscopy in 2022 in addition to finding esophagitis also reported the stomach to be full of food at that time and the procedure was aborted.  Past Medical History:  Diagnosis Date   Adenotonsillar hypertrophy  03/2012   snores during sleep; denies apnea; states occ. wakes up coughing   Anemia    Anxiety    Bipolar 1 disorder (HCC)    COPD (chronic obstructive pulmonary disease) (HCC)    stage 2, 2 liters of oxygen at night for ATX   Depression    Diabetes mellitus without complication (HCC)    Drug-seeking behavior    GERD (gastroesophageal reflux disease)    GI bleed    Heart failure (HCC)    Hypercholesterolemia    Migraines    Obesity    Pneumonia    Wears dentures    upper denture    Past Surgical History:  Procedure Laterality Date   APPENDECTOMY     BIOPSY  02/14/2016   Procedure: BIOPSY;  Surgeon: Margo LITTIE Haddock, MD;  [facility];  Service: Endoscopy;;  gastric biopsies   cardiac thoracic surgery     CESAREAN SECTION  08/31/2001   total of  3   CHOLECYSTECTOMY     DILATION AND EVACUATION  04/09/2000   ESOPHAGEAL DILATION N/A 09/03/2016   Procedure: ESOPHAGEAL DILATION;  Surgeon: Lamar CHRISTELLA Hollingshead, MD;  Location: [facility];  Service: Endoscopy;  Laterality: N/A;   ESOPHAGOGASTRODUODENOSCOPY (EGD) WITH PROPOFOL  N/A 02/14/2016   Dr. haddock: Large hiatal hernia, nonbleeding cratered gastric ulcer. Multiple nonbleeding erosions in the gastric fundus. Diffuse moderate inflammation, erosions, erythema  of the gastric antrum. All biopsies benign with no evidence of H. pylori.   ESOPHAGOGASTRODUODENOSCOPY (EGD) WITH PROPOFOL  N/A 06/02/2016   Procedure: ESOPHAGOGASTRODUODENOSCOPY (EGD) WITH PROPOFOL ;  Surgeon: Norleen Hint, MD;  Location[facility] END[facility]COPY;  Service: Endoscopy;  Laterality: N/A;   ESOPHAGOGASTRODUODENOSCOPY (EGD) WITH PROPOFOL  N/A 09/03/2016   Procedure: ESOPHAGOGASTRODUODENOSCOPY (EGD) WITH PROPOFOL ;  Surgeon: Lamar CHRISTELLA Hollingshead, MD;  [facility]: [facility];  Service: Endoscopy;  Laterality: N/A;   ESOPHAGOGASTRODUODENOSCOPY (EGD) WITH PROPOFOL  N/A 09/11/2021   Procedure: ESOPHAGOGASTRODUODENOSCOPY (EGD) WITH PROPOFOL ;  Surgeon: Leigh Elspeth SQUIBB, MD;  Location: [facility]  ENDOSCOPY;  Service: Gastroenterology;  Laterality: N/A;   HIATAL HERNIA REPAIR  10/2015   planning for repeat surgery in 1/18   open paraesophageal hernia repair with gastroexy  09/2015   Dr. Lupita Danger   TONSILLECTOMY     adenoidectomy   TONSILLECTOMY AND ADENOIDECTOMY  04/12/2012   Procedure: TONSILLECTOMY AND ADENOIDECTOMY;  Surgeon: Ana LELON Moccasin, MD;  Location: [facility];  Service: ENT;  Laterality: Bilateral;   TUBAL LIGATION  08/31/2001    Prior to Admission medications  Medication Sig Start Date End Date Taking? Authorizing Provider  buprenorphine -naloxone  (SUBOXONE ) 8-2 mg SUBL SL tablet Place 1.5 tablets under the tongue 2 (two) times daily. 09/18/24  Yes [provider]    Family History  Problem Relation Age of Onset   Cancer Other    Hypertension Mother    Heart disease Mother    Cancer Mother        ovarian   Cancer Maternal Aunt        not sure primary, in kidney and colon   Cancer Maternal Uncle        lung cancer    Stroke Maternal Grandfather      Social History[1]  Allergies as of 10/02/2024 - Review Complete 10/02/2024  Allergen Reaction Noted   Lidocaine  viscous hcl Other (See Comments) 06/01/2016   Penicillins Itching and Rash 07/17/2011    Review of Systems:    All systems reviewed and negative except where noted in HPI.   Physical Exam:  Vital signs in last 24 hours: Temp:  [98 F (36.7 C)-99 F (37.2 C)] 99 F (37.2 C) (01/06 0143) Pulse Rate:  [75-85] 85 (01/06 0343) Resp:  [16-20] 16 (01/06 0343) BP: (126-136)/(73-93) 126/73 (01/06 0343) SpO2:  [95 %-100 %] 100 % (01/06 0343)   General:   Pleasant, cooperative in NAD Head:  Normocephalic and atraumatic. Eyes:   No icterus.   Conjunctiva pink. PERRLA. Ears:  Normal auditory acuity. Neck:  Supple; no masses or thyroidomegaly Lungs: Respirations even and unlabored. Lungs clear to auscultation bilaterally.   No wheezes, crackles, or rhonchi.  Heart:  Regular rate  and rhythm;  Without murmur, clicks, rubs or gallops Abdomen:  Soft, nondistended, nontender. Normal bowel sounds. No appreciable masses or hepatomegaly.  No rebound or guarding.  Rectal:  Not performed. Msk:  Symmetrical without gross deformities.    Extremities:  Without edema, cyanosis or clubbing. Neurologic:  Alert and oriented x3;  grossly normal neurologically. Skin:  Intact without significant lesions or rashes. Cervical Nodes:  No significant cervical adenopathy. Psych:  Alert and cooperative. Normal affect.  LAB RESULTS: Recent Labs    10/02/24 1836 10/03/24 0157  WBC 12.3*  --   HGB 15.1* 15.1*  HCT 45.0 45.1  PLT 278  --    BMET Recent Labs    10/02/24 1836  NA 139  K 3.6  CL 101  CO2 29  GLUCOSE 117*  BUN 17  CREATININE 0.68  CALCIUM  10.7*   LFT Recent Labs    10/02/24 1836  PROT 7.4  ALBUMIN 4.4  AST 189*  ALT 207*  ALKPHOS 112  BILITOT 0.7   PT/INR Recent Labs    10/03/24 0157  LABPROT 13.6  INR 1.0    STUDIES: CT ANGIO GI BLEED Result Date: 10/03/2024 EXAM: CTA ABDOMEN AND PELVIS WITH CONTRAST 10/03/2024 03:20:52 AM TECHNIQUE: CTA images of the abdomen and pelvis with intravenous contrast. Three-dimensional MIP/volume rendered formations were performed. Automated exposure control, iterative reconstruction, and/or weight based adjustment of the mA/kV was utilized to reduce the radiation dose to as low as reasonably achievable. COMPARISON: None available. CLINICAL HISTORY: Concern for upper GI bleed, abdominal pain. FINDINGS: VASCULATURE: GI BLEED: No contrast extravasation to localize GI bleed. AORTA: No acute finding. No abdominal aortic aneurysm. No dissection. CELIAC TRUNK: No acute finding. No occlusion or significant stenosis. SUPERIOR MESENTRIC ARTERY: No acute finding. No occlusion or significant stenosis. INFERIOR MESENTERIC ARTERY: No acute finding. No occlusion or significant stenosis. RENAL ARTERIES: No acute finding. No occlusion or  significant stenosis. ILIAC ARTERIES: No acute finding. No occlusion or significant stenosis. ABDOMEN/PELVIS: LOWER CHEST: Linear scarring in the left lung base. LIVER: The liver is unremarkable. GALLBLADDER AND BILE DUCTS: Prior cholecystectomy. No biliary ductal dilatation. SPLEEN: The spleen is unremarkable. PANCREAS: The pancreas is unremarkable. ADRENAL GLANDS: Bilateral adrenal glands demonstrate no acute abnormality. KIDNEYS, URETERS AND BLADDER: No stones in the kidneys or ureters. No hydronephrosis. No perinephric or periureteral stranding. Urinary bladder is unremarkable. GI AND BOWEL: Mild wall thickening thickening in the mid stomach suggesting gastritis. Scattered sigmoid diverticula. There is no bowel obstruction. No abnormal bowel wall thickening or distension. REPRODUCTIVE: Reproductive organs are unremarkable. PERITONEUM AND RETROPERITONEUM: No ascites or free air. LYMPH NODES: No lymphadenopathy. BONES AND SOFT TISSUES: Several small ventral hernias containing fat. No acute abnormality of the bones. IMPRESSION: 1. No active GI bleeding visualized . 2. Mild wall  thickening in the mid stomach suggesting gastritis. 3. Scattered sigmoid diverticula without evidence of diverticulitis. Electronically signed by: Franky Crease MD 10/03/2024 03:33 AM EST RP Workstation: [id number]      Impression / Plan:   Assessment: Principal Problem:   Upper GI bleed Active Problems:   Mixed bipolar I disorder (HCC)   COPD (chronic obstructive pulmonary disease) (HCC)   Urinary tract infection   Abnormal LFTs   History of esophagitis/GI bleed   Chronic pain   Shelby Hatfield is a 49 y.o. y/o female with who was admitted for hematemesis.  The patient has not had any further bouts of vomiting blood overnight.  Her symptoms started yesterday.  Her hemoglobin on admission was 15.1 with a repeat again showing the same hemoglobin of 15.1.  She has a history of esophagitis and a CT scan on this admission showed  possible gastritis.  Plan:  The patient will be set up for a upper endoscopy for today.  She has been NPO.  If the upper endoscopy is negative and her hemoglobin remained stable then the patient should be sent home on a PPI after the endoscopy from a GI point of view. The patient has been explained the plan risks benefits and alternatives of the upper endoscopy and she agrees to proceed with the upper endoscopy.  PPI IV twice daily  Continue serial CBCs and transfuse PRN Avoid NSAIDs Maintain 2 large-bore IV lines Please page GI with any acute hemodynamic changes,  or signs of active GI bleeding   Thank you for involving me in the care of this patient.      LOS: 0 days   Shelby Copping, MD, MD. NOLIA 10/03/2024, 7:31 AM,  Pager [phone] 7am-5pm  Check AMION for 5pm -7am coverage and on weekends   Note: This dictation was prepared with Dragon dictation along with smaller phrase technology. Any transcriptional errors that result from this process are unintentional.       [1]  Social History Tobacco Use   Smoking status: Some Days    Current packs/day: 0.33    Average packs/day: 0.3 packs/day for 18.0 years (5.9 ttl pk-yrs)    Types: Cigarettes   Smokeless tobacco: Never  Vaping Use   Vaping status: Former  Substance Use Topics   Alcohol use: No    Alcohol/week: 0.0 standard drinks of alcohol   Drug use: Yes    Types: Marijuana, Cocaine   "

## 2024-10-03 NOTE — ED Notes (Signed)
 Patient transported to CT

## 2024-10-03 NOTE — Assessment & Plan Note (Signed)
 Denies alcohol use Will get acute hepatitis panel

## 2024-10-03 NOTE — Discharge Instructions (Signed)
 Some PCP options in [place] area- not a comprehensive list  [facility]- [phone] [facility]- [phone] [facility]- [phone] [facility]- [phone] [facility]- [phone] Nichole Molly- [phone]  or [facility] Physician Referral Line [phone]

## 2024-10-03 NOTE — Assessment & Plan Note (Signed)
 Resume home meds once verified

## 2024-10-03 NOTE — ED Provider Notes (Signed)
 "  [facility] Provider Note    Event Date/Time   First MD Initiated Contact with Patient 10/03/24 0125     (approximate)   History   Hematemesis   HPI  Shelby Hatfield is a 49 y.o. female with history of COPD, bipolar disorder, hyperlipidemia, diabetes who presents to the emergency department with complaints of upper abdominal pain, hematemesis ongoing for a couple of days.  States she is vomiting up bright red blood, clots that she describes as teaspoonfuls.  Not on any blood thinners.  States she has had this happen once to her before from a hernia.  Denies any melena.  No history of alcohol use, NSAID use.  Reports subjective fevers at home.  In 2022 patient had an endoscopy by Dr. Leigh.  At that time was found to have severe esophagitis with no stigmata of bleeding.  Still had food contents in the stomach so procedure was aborted.  Had a small hiatal hernia.   History provided by patient.    Past Medical History:  Diagnosis Date   Adenotonsillar hypertrophy 03/2012   snores during sleep; denies apnea; states occ. wakes up coughing   Anemia    Anxiety    Bipolar 1 disorder (HCC)    COPD (chronic obstructive pulmonary disease) (HCC)    stage 2, 2 liters of oxygen at night for ATX   Depression    Diabetes mellitus without complication (HCC)    Drug-seeking behavior    GERD (gastroesophageal reflux disease)    GI bleed    Heart failure (HCC)    Hypercholesterolemia    Migraines    Obesity    Pneumonia    Wears dentures    upper denture    Past Surgical History:  Procedure Laterality Date   APPENDECTOMY     BIOPSY  02/14/2016   Procedure: BIOPSY;  Surgeon: Margo LITTIE Haddock, MD;  Location: [facility];  Service: Endoscopy;;  gastric biopsies   cardiac thoracic surgery     CESAREAN SECTION  08/31/2001   total of  3   CHOLECYSTECTOMY     DILATION AND EVACUATION  04/09/2000   ESOPHAGEAL DILATION N/A 09/03/2016   Procedure: ESOPHAGEAL  DILATION;  Surgeon: Lamar CHRISTELLA Hollingshead, MD;  Location: [facility];  Service: Endoscopy;  Laterality: N/A;   ESOPHAGOGASTRODUODENOSCOPY (EGD) WITH PROPOFOL  N/A 02/14/2016   Dr. haddock: Large hiatal hernia, nonbleeding cratered gastric ulcer. Multiple nonbleeding erosions in the gastric fundus. Diffuse moderate inflammation, erosions, erythema of the gastric antrum. All biopsies benign with no evidence of H. pylori.   ESOPHAGOGASTRODUODENOSCOPY (EGD) WITH PROPOFOL  N/A 06/02/2016   Procedure: ESOPHAGOGASTRODUODENOSCOPY (EGD) WITH PROPOFOL ;  Surgeon: Norleen Hint, MD;  Location: [facility] ENDOSCOPY;  Service: Endoscopy;  Laterality: N/A;   ESOPHAGOGASTRODUODENOSCOPY (EGD) WITH PROPOFOL  N/A 09/03/2016   Procedure: ESOPHAGOGASTRODUODENOSCOPY (EGD) WITH PROPOFOL ;  Surgeon: Lamar CHRISTELLA Hollingshead, MD;  Location: [facility];  Service: Endoscopy;  Laterality: N/A;   ESOPHAGOGASTRODUODENOSCOPY (EGD) WITH PROPOFOL  N/A 09/11/2021   Procedure: ESOPHAGOGASTRODUODENOSCOPY (EGD) WITH PROPOFOL ;  Surgeon: Leigh Elspeth SQUIBB, MD;  Location: [facility] ENDOSCOPY;  Service: Gastroenterology;  Laterality: N/A;   HIATAL HERNIA REPAIR  10/2015   planning for repeat surgery in 1/18   open paraesophageal hernia repair with gastroexy  09/2015   Dr. Lupita Danger   TONSILLECTOMY     adenoidectomy   TONSILLECTOMY AND ADENOIDECTOMY  04/12/2012   Procedure: TONSILLECTOMY AND ADENOIDECTOMY;  Surgeon: Ana LELON Moccasin, MD;  Location: [facility];  Service:  ENT;  Laterality: Bilateral;   TUBAL LIGATION  08/31/2001    MEDICATIONS:  Prior to Admission medications  Medication Sig Start Date End Date Taking? Authorizing Provider  metoCLOPramide  (REGLAN ) 5 MG tablet Take 1 tablet (5 mg total) by mouth 3 (three) times daily before meals. 09/12/21 10/12/21  Zinoviev, Eva, MD  pantoprazole  (PROTONIX ) 40 MG tablet Take 1 tablet (40 mg total) by mouth 2 (two) times daily. 09/12/21 10/12/21  Zinoviev, Eva, MD  sucralfate  (CARAFATE ) 1 GM/10ML suspension Take  10 mLs (1 g total) by mouth every 6 (six) hours. 09/12/21 10/12/21  Velora Distel, MD    Physical Exam   Triage Vital Signs: ED Triage Vitals  Encounter Vitals Group     BP 10/02/24 1835 (!) 136/93     Girls Systolic BP Percentile --      Girls Diastolic BP Percentile --      Boys Systolic BP Percentile --      Boys Diastolic BP Percentile --      Pulse Rate 10/02/24 1833 75     Resp 10/02/24 1833 20     Temp 10/02/24 1833 98 F (36.7 C)     Temp src --      SpO2 10/02/24 1833 95 %     Weight --      Height --      Head Circumference --      Peak Flow --      Pain Score 10/02/24 1833 8     Pain Loc --      Pain Education --      Exclude from Growth Chart --     Most recent vital signs: Vitals:   10/03/24 0143 10/03/24 0343  BP:  126/73  Pulse:  85  Resp:  16  Temp: 99 F (37.2 C)   SpO2:  100%    CONSTITUTIONAL: Alert, responds appropriately to questions.  Chronically ill-appearing HEAD: Normocephalic, atraumatic EYES: Conjunctivae clear, pupils appear equal, sclera nonicteric ENT: normal nose; moist mucous membranes NECK: Supple, normal ROM CARD: RRR; S1 and S2 appreciated RESP: Normal chest excursion without splinting or tachypnea; breath sounds clear and equal bilaterally; no wheezes, no rhonchi, no rales, no hypoxia or respiratory distress, speaking full sentences ABD/GI: Non-distended; soft, tender throughout the abdomen worse in the epigastric region without peritoneal signs RECTAL:  Normal rectal tone, no gross blood or melena, minimal stool on rectal exam but is strongly guaiac positive, no hemorrhoids appreciated, nontender rectal exam, no fecal impaction. Chaperone present. BACK: The back appears normal EXT: Normal ROM in all joints; no deformity noted, no edema SKIN: Normal color for age and race; warm; no rash on exposed skin NEURO: Moves all extremities equally, normal speech PSYCH: The patient's mood and manner are appropriate.   ED Results /  Procedures / Treatments   LABS: (all labs ordered are listed, but only abnormal results are displayed) Labs Reviewed  COMPREHENSIVE METABOLIC PANEL WITH GFR - Abnormal; Notable for the following components:      Result Value   Glucose, Bld 117 (*)    Calcium  10.7 (*)    AST 189 (*)    ALT 207 (*)    All other components within normal limits  CBC - Abnormal; Notable for the following components:   WBC 12.3 (*)    RBC 5.27 (*)    Hemoglobin 15.1 (*)    All other components within normal limits  URINALYSIS, ROUTINE W REFLEX MICROSCOPIC - Abnormal; Notable for the following components:  Color, Urine AMBER (*)    APPearance HAZY (*)    Ketones, ur 5 (*)    Nitrite POSITIVE (*)    Leukocytes,Ua TRACE (*)    Bacteria, UA MANY (*)    All other components within normal limits  HEMOGLOBIN AND HEMATOCRIT, BLOOD - Abnormal; Notable for the following components:   Hemoglobin 15.1 (*)    All other components within normal limits  URINE CULTURE  LIPASE, BLOOD  PROTIME-INR  HEPATITIS PANEL, ACUTE  TYPE AND SCREEN     EKG:  EKG Interpretation Date/Time:  Monday October 02 2024 18:37:16 EST Ventricular Rate:  81 PR Interval:  132 QRS Duration:  84 QT Interval:  380 QTC Calculation: 441 R Axis:   83  Text Interpretation: Normal sinus rhythm Cannot rule out Anterior infarct , age undetermined Abnormal ECG When compared with ECG of 10-Sep-2021 13:27, PREVIOUS ECG IS PRESENT Confirmed by Neomi Neptune [phone]) on 10/03/2024 3:05:48 AM         RADIOLOGY: My personal review and interpretation of imaging: CT scan shows gastritis.  No active bleeding.  I have personally reviewed all radiology reports.   CT ANGIO GI BLEED Result Date: 10/03/2024 EXAM: CTA ABDOMEN AND PELVIS WITH CONTRAST 10/03/2024 03:20:52 AM TECHNIQUE: CTA images of the abdomen and pelvis with intravenous contrast. Three-dimensional MIP/volume rendered formations were performed. Automated exposure control, iterative  reconstruction, and/or weight based adjustment of the mA/kV was utilized to reduce the radiation dose to as low as reasonably achievable. COMPARISON: None available. CLINICAL HISTORY: Concern for upper GI bleed, abdominal pain. FINDINGS: VASCULATURE: GI BLEED: No contrast extravasation to localize GI bleed. AORTA: No acute finding. No abdominal aortic aneurysm. No dissection. CELIAC TRUNK: No acute finding. No occlusion or significant stenosis. SUPERIOR MESENTRIC ARTERY: No acute finding. No occlusion or significant stenosis. INFERIOR MESENTERIC ARTERY: No acute finding. No occlusion or significant stenosis. RENAL ARTERIES: No acute finding. No occlusion or significant stenosis. ILIAC ARTERIES: No acute finding. No occlusion or significant stenosis. ABDOMEN/PELVIS: LOWER CHEST: Linear scarring in the left lung base. LIVER: The liver is unremarkable. GALLBLADDER AND BILE DUCTS: Prior cholecystectomy. No biliary ductal dilatation. SPLEEN: The spleen is unremarkable. PANCREAS: The pancreas is unremarkable. ADRENAL GLANDS: Bilateral adrenal glands demonstrate no acute abnormality. KIDNEYS, URETERS AND BLADDER: No stones in the kidneys or ureters. No hydronephrosis. No perinephric or periureteral stranding. Urinary bladder is unremarkable. GI AND BOWEL: Mild wall thickening thickening in the mid stomach suggesting gastritis. Scattered sigmoid diverticula. There is no bowel obstruction. No abnormal bowel wall thickening or distension. REPRODUCTIVE: Reproductive organs are unremarkable. PERITONEUM AND RETROPERITONEUM: No ascites or free air. LYMPH NODES: No lymphadenopathy. BONES AND SOFT TISSUES: Several small ventral hernias containing fat. No acute abnormality of the bones. IMPRESSION: 1. No active GI bleeding visualized . 2. Mild wall  thickening in the mid stomach suggesting gastritis. 3. Scattered sigmoid diverticula without evidence of diverticulitis. Electronically signed by: Franky Crease MD 10/03/2024 03:33 AM EST  RP Workstation: [id number]     PROCEDURES:  Critical Care performed: No      .1-3 Lead EKG Interpretation  Performed by: Teah Votaw, Neptune SAILOR, DO Authorized by: Constantino Starace, Neptune SAILOR, DO     Interpretation: normal     ECG rate:  85   ECG rate assessment: normal     Rhythm: sinus rhythm     Ectopy: none     Conduction: normal       IMPRESSION / MDM / ASSESSMENT AND PLAN / ED COURSE  I reviewed  the triage vital signs and the nursing notes.    Patient here with complaints of abdominal pain, hematemesis.  The patient is on the cardiac monitor to evaluate for evidence of arrhythmia and/or significant heart rate changes.   DIFFERENTIAL DIAGNOSIS (includes but not limited to):   Gastritis, GERD, esophagitis, bleeding ulcer, H. pylori, doubt perforation, less likely esophageal varices   Patient's presentation is most consistent with acute presentation with potential threat to life or bodily function.   PLAN: Patient's hemoglobin is 15.1.  Normal platelets.  Will check coags.  Minimally elevated AST and ALT but normal bilirubin.  She has had previous cholecystectomy.  Will repeat H&H to ensure no significant drop given patient reports multiple episodes of hematemesis in the waiting room.  She is hemodynamically stable.  Will also obtain CT scan given complaints of pain, subjective fevers.  Repeat temperature here is 99.  Will give IV fluids, Protonix , antiemetics, pain medication.  Will keep NPO.   MEDICATIONS GIVEN IN ED: Medications  0.9 %  sodium chloride  infusion ( Intravenous New Bag/Given 10/03/24 0214)  pantoprazole  (PROTONIX ) injection 40 mg (has no administration in time range)  cefTRIAXone  (ROCEPHIN ) 1 g in sodium chloride  0.9 % 100 mL IVPB (0 g Intravenous Stopped 10/03/24 0554)  buprenorphine -naloxone  (SUBOXONE ) 8-2 mg per SL tablet 1.5 tablet (has no administration in time range)  morphine  (PF) 4 MG/ML injection 4 mg (4 mg Intravenous Given 10/03/24 0215)  ondansetron   (ZOFRAN ) injection 4 mg (4 mg Intravenous Given 10/03/24 0214)  pantoprazole  (PROTONIX ) injection 80 mg (80 mg Intravenous Given 10/03/24 0216)  iohexol  (OMNIPAQUE ) 350 MG/ML injection 100 mL (100 mLs Intravenous Contrast Given 10/03/24 0242)  metoCLOPramide  (REGLAN ) injection 10 mg (10 mg Intravenous Given 10/03/24 0314)  cefTRIAXone  (ROCEPHIN ) 2 g in sodium chloride  0.9 % 100 mL IVPB (0 g Intravenous Stopped 10/03/24 0400)  HYDROmorphone  (DILAUDID ) injection 1 mg (1 mg Intravenous Given 10/03/24 0335)  alum & mag hydroxide-simeth (MAALOX/MYLANTA) 200-200-20 MG/5ML suspension 30 mL (30 mLs Oral Given 10/03/24 0340)  sucralfate  (CARAFATE ) tablet 1 g (1 g Oral Given 10/03/24 0341)     ED COURSE: CT scan shows no active bleeding.  She does have gastritis but no perforation.  Urine does appear infected.  Will add on culture and give Rocephin .  No other acute abnormality seen on CT scan.  Repeat hemoglobin stable at 15.1.  Patient given multiple medications without significant relief and is still spitting up clear liquid, dry heaving but no episodes of hematemesis that I have witnessed.  We discussed going home with medications and GI follow-up but patient states that she would prefer admission to figure out what is going on and for symptom management.   CONSULTS:  Consulted and discussed patient's case with hospitalist, Dr. Cleatus.  I have recommended admission and consulting physician agrees and will place admission orders.  Patient (and family if present) agree with this plan.   I reviewed all nursing notes, vitals, pertinent previous records.  All labs, EKGs, imaging ordered have been independently reviewed and interpreted by myself.    OUTSIDE RECORDS REVIEWED: Reviewed last GI notes.       FINAL CLINICAL IMPRESSION(S) / ED DIAGNOSES   Final diagnoses:  Hematemesis with nausea  Acute UTI  Other gastritis with bleeding, unspecified chronicity     Rx / DC Orders   ED Discharge Orders      None        Note:  This document was prepared using Dragon voice recognition software  and may include unintentional dictation errors.   Marcellis Frampton, Josette SAILOR, DO 10/03/24 [phone]  "

## 2024-10-03 NOTE — Op Note (Signed)
 [facility] Gastroenterology Patient Name: Shelby Hatfield Procedure Date: 10/03/2024 11:25 AM MRN: [id number] Account #: [id number] Date of Birth: [birth year] Admit Type: Inpatient Age: 49 Room: [facility] ENDO ROOM 4 Gender: Female Note Status: Finalized Instrument Name: Endoscope [id number] Procedure:             Upper GI endoscopy Indications:           Hematemesis Providers:             Rogelia Copping MD, MD Referring MD:          No Local Md, MD (Referring MD) Medicines:             Propofol  per Anesthesia Complications:         No immediate complications. Procedure:             Pre-Anesthesia Assessment:                        - Prior to the procedure, a History and Physical was                         performed, and patient medications and allergies were                         reviewed. The patient's tolerance of previous                         anesthesia was also reviewed. The risks and benefits                         of the procedure and the sedation options and risks                         were discussed with the patient. All questions were                         answered, and informed consent was obtained. Prior                         Anticoagulants: The patient has taken no anticoagulant                         or antiplatelet agents. ASA Grade Assessment: II - A                         patient with mild systemic disease. After reviewing                         the risks and benefits, the patient was deemed in                         satisfactory condition to undergo the procedure.                        After obtaining informed consent, the endoscope was                         passed under direct vision. Throughout the procedure,  the patient's blood pressure, pulse, and oxygen                         saturations were monitored continuously. The Endoscope                         was introduced through the mouth, and advanced to the                          second part of duodenum. The upper GI endoscopy was                         accomplished without difficulty. The patient tolerated                         the procedure well. Findings:      LA Grade D (one or more mucosal breaks involving at least 75% of       esophageal circumference) esophagitis with no bleeding was found in the       lower third of the esophagus.      The stomach was normal.      The examined duodenum was normal. Impression:            - LA Grade D esophagitis with no bleeding.                        - Normal stomach.                        - Normal examined duodenum.                        - No specimens collected. Recommendation:        - Return patient to hospital ward for ongoing care.                        - Resume regular diet.                        - Continue present medications.                        - Use a proton pump inhibitor PO daily. Procedure Code(s):     --- Professional ---                        [phone], Esophagogastroduodenoscopy, flexible,                         transoral; diagnostic, including collection of                         specimen(s) by brushing or washing, when performed                         (separate procedure) Diagnosis Code(s):     --- Professional ---                        K92.0, Hematemesis  K20.90, Esophagitis, unspecified without bleeding CPT copyright 2022 [employer]. All rights reserved. The codes documented in this report are preliminary and upon coder review may  be revised to meet current compliance requirements. Rogelia Copping MD, MD 10/03/2024 11:47:16 AM This report has been signed electronically. Number of Addenda: 0 Note Initiated On: 10/03/2024 11:25 AM Estimated Blood Loss:  Estimated blood loss: none.      [facility]

## 2024-10-03 NOTE — Transfer of Care (Signed)
 Immediate Anesthesia Transfer of Care Note  Patient: Shelby Hatfield  Procedure(s) Performed: EGD (ESOPHAGOGASTRODUODENOSCOPY)  Patient Location: PACU  Anesthesia Type:General  Level of Consciousness: sedated  Airway & Oxygen Therapy: Patient Spontanous Breathing  Post-op Assessment: Report given to RN and Post -op Vital signs reviewed and stable  Post vital signs: Reviewed and stable  Last Vitals:  Vitals Value Taken Time  BP    Temp    Pulse 89 10/03/24 11:48  Resp 12 10/03/24 11:48  SpO2 98 % 10/03/24 11:48  Vitals shown include unfiled device data.  Last Pain:  Vitals:   10/03/24 1035  TempSrc: Tympanic  PainSc: 8          Complications: No notable events documented.

## 2024-10-04 LAB — TYPE AND SCREEN
ABO/RH(D): O POS
Antibody Screen: NEGATIVE
Unit division: 0
Unit division: 0

## 2024-10-04 LAB — BPAM RBC
Blood Product Expiration Date: 202602142359
Blood Product Expiration Date: 202602142359
Unit Type and Rh: 5100
Unit Type and Rh: 5100

## 2024-10-05 LAB — URINE CULTURE: Culture: >=100000 — AB
